# Patient Record
Sex: Female | Born: 1955
Health system: Southern US, Community
[De-identification: ages and names within clinical notes are randomized; demographics above are authoritative.]

## PROBLEM LIST (undated history)

## (undated) DIAGNOSIS — I10 Essential (primary) hypertension: Secondary | ICD-10-CM

## (undated) DIAGNOSIS — D649 Anemia, unspecified: Secondary | ICD-10-CM

## (undated) DIAGNOSIS — E785 Hyperlipidemia, unspecified: Secondary | ICD-10-CM

## (undated) DIAGNOSIS — Z85850 Personal history of malignant neoplasm of thyroid: Secondary | ICD-10-CM

## (undated) DIAGNOSIS — S04012A Injury of optic nerve, left eye, initial encounter: Secondary | ICD-10-CM

## (undated) DIAGNOSIS — Z974 Presence of external hearing-aid: Secondary | ICD-10-CM

## (undated) DIAGNOSIS — S04032A Injury of optic tract and pathways, left eye, initial encounter: Secondary | ICD-10-CM

## (undated) DIAGNOSIS — F32A Depression, unspecified: Secondary | ICD-10-CM

## (undated) DIAGNOSIS — H9191 Unspecified hearing loss, right ear: Secondary | ICD-10-CM

## (undated) DIAGNOSIS — K5130 Ulcerative (chronic) rectosigmoiditis without complications: Secondary | ICD-10-CM

## (undated) DIAGNOSIS — C801 Malignant (primary) neoplasm, unspecified: Secondary | ICD-10-CM

## (undated) DIAGNOSIS — Z860100 Personal history of colon polyps, unspecified: Secondary | ICD-10-CM

## (undated) DIAGNOSIS — I428 Other cardiomyopathies: Secondary | ICD-10-CM

## (undated) DIAGNOSIS — I509 Heart failure, unspecified: Secondary | ICD-10-CM

## (undated) DIAGNOSIS — M75102 Unspecified rotator cuff tear or rupture of left shoulder, not specified as traumatic: Secondary | ICD-10-CM

## (undated) DIAGNOSIS — R112 Nausea with vomiting, unspecified: Secondary | ICD-10-CM

## (undated) DIAGNOSIS — Z973 Presence of spectacles and contact lenses: Secondary | ICD-10-CM

## (undated) DIAGNOSIS — M797 Fibromyalgia: Secondary | ICD-10-CM

## (undated) DIAGNOSIS — Z9889 Other specified postprocedural states: Secondary | ICD-10-CM

## (undated) DIAGNOSIS — E890 Postprocedural hypothyroidism: Secondary | ICD-10-CM

## (undated) DIAGNOSIS — G43909 Migraine, unspecified, not intractable, without status migrainosus: Secondary | ICD-10-CM

## (undated) DIAGNOSIS — R0600 Dyspnea, unspecified: Secondary | ICD-10-CM

## (undated) DIAGNOSIS — F329 Major depressive disorder, single episode, unspecified: Secondary | ICD-10-CM

## (undated) DIAGNOSIS — F419 Anxiety disorder, unspecified: Secondary | ICD-10-CM

## (undated) DIAGNOSIS — L719 Rosacea, unspecified: Secondary | ICD-10-CM

## (undated) DIAGNOSIS — R011 Cardiac murmur, unspecified: Secondary | ICD-10-CM

## (undated) DIAGNOSIS — M1990 Unspecified osteoarthritis, unspecified site: Secondary | ICD-10-CM

## (undated) DIAGNOSIS — H9190 Unspecified hearing loss, unspecified ear: Secondary | ICD-10-CM

## (undated) DIAGNOSIS — R160 Hepatomegaly, not elsewhere classified: Secondary | ICD-10-CM

## (undated) DIAGNOSIS — K589 Irritable bowel syndrome without diarrhea: Secondary | ICD-10-CM

## (undated) DIAGNOSIS — K219 Gastro-esophageal reflux disease without esophagitis: Secondary | ICD-10-CM

## (undated) DIAGNOSIS — E119 Type 2 diabetes mellitus without complications: Secondary | ICD-10-CM

## (undated) DIAGNOSIS — R4586 Emotional lability: Secondary | ICD-10-CM

## (undated) HISTORY — DX: Ulcerative (chronic) rectosigmoiditis without complications: K51.30

## (undated) HISTORY — DX: Major depressive disorder, single episode, unspecified: F32.9

## (undated) HISTORY — DX: Fibromyalgia: M79.7

## (undated) HISTORY — DX: Hyperlipidemia, unspecified: E78.5

## (undated) HISTORY — DX: Malignant (primary) neoplasm, unspecified: C80.1

## (undated) HISTORY — PX: JOINT REPLACEMENT: SHX530

## (undated) HISTORY — DX: Unspecified osteoarthritis, unspecified site: M19.90

## (undated) HISTORY — DX: Rosacea, unspecified: L71.9

## (undated) HISTORY — DX: Essential (primary) hypertension: I10

## (undated) HISTORY — DX: Migraine, unspecified, not intractable, without status migrainosus: G43.909

## (undated) HISTORY — PX: TRANSTHORACIC ECHOCARDIOGRAM: SHX275

## (undated) HISTORY — PX: KNEE ARTHROSCOPY: SUR90

## (undated) HISTORY — DX: Unspecified hearing loss, right ear: H91.91

## (undated) HISTORY — DX: Depression, unspecified: F32.A

## (undated) HISTORY — PX: TOTAL THYROIDECTOMY: SHX2547

## (undated) HISTORY — DX: Irritable bowel syndrome, unspecified: K58.9

## (undated) HISTORY — DX: Cardiac murmur, unspecified: R01.1

## (undated) HISTORY — DX: Anxiety disorder, unspecified: F41.9

## (undated) HISTORY — DX: Hepatomegaly, not elsewhere classified: R16.0

## (undated) HISTORY — PX: CARDIOVASCULAR STRESS TEST: SHX262

## (undated) HISTORY — PX: OTHER SURGICAL HISTORY: SHX169

## (undated) HISTORY — PX: SHOULDER SURGERY: SHX246

## (undated) HISTORY — DX: Gastro-esophageal reflux disease without esophagitis: K21.9

---

## 1982-07-17 HISTORY — PX: DILATION AND CURETTAGE OF UTERUS: SHX78

## 1994-07-17 HISTORY — PX: EXCISION MORTON'S NEUROMA: SHX5013

## 1996-12-15 DIAGNOSIS — M797 Fibromyalgia: Secondary | ICD-10-CM | POA: Insufficient documentation

## 1998-01-07 ENCOUNTER — Encounter: Admission: RE | Admit: 1998-01-07 | Discharge: 1998-04-07 | Payer: Self-pay | Admitting: *Deleted

## 1998-04-29 ENCOUNTER — Other Ambulatory Visit: Admission: RE | Admit: 1998-04-29 | Discharge: 1998-04-29 | Payer: Self-pay | Admitting: *Deleted

## 1998-08-18 ENCOUNTER — Encounter: Payer: Self-pay | Admitting: Specialist

## 1998-08-19 ENCOUNTER — Observation Stay (HOSPITAL_COMMUNITY): Admission: RE | Admit: 1998-08-19 | Discharge: 1998-08-20 | Payer: Self-pay | Admitting: Specialist

## 1998-08-19 ENCOUNTER — Encounter: Payer: Self-pay | Admitting: Specialist

## 1999-07-18 HISTORY — PX: WRIST SURGERY: SHX841

## 2000-02-22 ENCOUNTER — Encounter: Admission: RE | Admit: 2000-02-22 | Discharge: 2000-05-22 | Payer: Self-pay | Admitting: Family Medicine

## 2001-03-04 ENCOUNTER — Encounter: Admission: RE | Admit: 2001-03-04 | Discharge: 2001-03-04 | Payer: Self-pay | Admitting: Neurology

## 2001-03-04 ENCOUNTER — Encounter: Payer: Self-pay | Admitting: Neurology

## 2001-07-17 HISTORY — PX: CARPOMETACARPAL (CMC) FUSION OF THUMB: SHX6290

## 2002-04-29 ENCOUNTER — Encounter: Admission: RE | Admit: 2002-04-29 | Discharge: 2002-06-11 | Payer: Self-pay | Admitting: *Deleted

## 2002-11-20 ENCOUNTER — Ambulatory Visit (HOSPITAL_COMMUNITY): Admission: RE | Admit: 2002-11-20 | Discharge: 2002-11-20 | Payer: Self-pay | Admitting: *Deleted

## 2002-11-20 ENCOUNTER — Encounter: Payer: Self-pay | Admitting: *Deleted

## 2002-12-02 ENCOUNTER — Ambulatory Visit (HOSPITAL_COMMUNITY): Admission: RE | Admit: 2002-12-02 | Discharge: 2002-12-02 | Payer: Self-pay | Admitting: *Deleted

## 2002-12-02 ENCOUNTER — Encounter: Payer: Self-pay | Admitting: *Deleted

## 2003-02-12 ENCOUNTER — Encounter: Admission: RE | Admit: 2003-02-12 | Discharge: 2003-02-12 | Payer: Self-pay | Admitting: Gastroenterology

## 2003-02-12 ENCOUNTER — Encounter: Payer: Self-pay | Admitting: Gastroenterology

## 2004-02-26 ENCOUNTER — Ambulatory Visit (HOSPITAL_COMMUNITY): Admission: RE | Admit: 2004-02-26 | Discharge: 2004-02-26 | Payer: Self-pay | Admitting: *Deleted

## 2004-02-26 ENCOUNTER — Encounter (INDEPENDENT_AMBULATORY_CARE_PROVIDER_SITE_OTHER): Payer: Self-pay | Admitting: Specialist

## 2004-04-28 ENCOUNTER — Ambulatory Visit: Payer: Self-pay

## 2004-05-05 ENCOUNTER — Ambulatory Visit: Payer: Self-pay | Admitting: General Practice

## 2004-05-17 ENCOUNTER — Emergency Department: Payer: Self-pay | Admitting: Emergency Medicine

## 2004-05-19 ENCOUNTER — Encounter (INDEPENDENT_AMBULATORY_CARE_PROVIDER_SITE_OTHER): Payer: Self-pay | Admitting: Specialist

## 2004-05-19 ENCOUNTER — Ambulatory Visit (HOSPITAL_COMMUNITY): Admission: RE | Admit: 2004-05-19 | Discharge: 2004-05-19 | Payer: Self-pay | Admitting: Gastroenterology

## 2004-10-31 ENCOUNTER — Ambulatory Visit: Payer: Self-pay | Admitting: Family Medicine

## 2004-11-25 ENCOUNTER — Encounter: Admission: RE | Admit: 2004-11-25 | Discharge: 2004-11-25 | Payer: Self-pay | Admitting: General Surgery

## 2005-08-30 ENCOUNTER — Encounter: Admission: RE | Admit: 2005-08-30 | Discharge: 2005-08-30 | Payer: Self-pay | Admitting: Family Medicine

## 2005-10-06 ENCOUNTER — Encounter: Admission: RE | Admit: 2005-10-06 | Discharge: 2005-10-06 | Payer: Self-pay | Admitting: Gastroenterology

## 2006-03-15 ENCOUNTER — Ambulatory Visit (HOSPITAL_COMMUNITY): Admission: RE | Admit: 2006-03-15 | Discharge: 2006-03-15 | Payer: Self-pay | Admitting: Anesthesiology

## 2006-05-31 ENCOUNTER — Ambulatory Visit: Payer: Self-pay

## 2006-06-04 ENCOUNTER — Ambulatory Visit: Payer: Self-pay

## 2006-06-25 ENCOUNTER — Inpatient Hospital Stay: Payer: Self-pay | Admitting: Unknown Physician Specialty

## 2006-06-25 DIAGNOSIS — C73 Malignant neoplasm of thyroid gland: Secondary | ICD-10-CM | POA: Insufficient documentation

## 2006-06-25 HISTORY — DX: Malignant neoplasm of thyroid gland: C73

## 2006-06-30 ENCOUNTER — Ambulatory Visit: Payer: Self-pay | Admitting: Oncology

## 2006-07-05 ENCOUNTER — Ambulatory Visit (HOSPITAL_COMMUNITY): Admission: RE | Admit: 2006-07-05 | Discharge: 2006-07-05 | Payer: Self-pay | Admitting: Oncology

## 2006-07-05 LAB — CBC WITH DIFFERENTIAL/PLATELET
BASO%: 0.4 % (ref 0.0–2.0)
Basophils Absolute: 0 10*3/uL (ref 0.0–0.1)
EOS%: 1.1 % (ref 0.0–7.0)
Eosinophils Absolute: 0.1 10*3/uL (ref 0.0–0.5)
HCT: 35.4 % (ref 34.8–46.6)
HGB: 12.1 g/dL (ref 11.6–15.9)
LYMPH%: 31.5 % (ref 14.0–48.0)
MCH: 30.2 pg (ref 26.0–34.0)
MCHC: 34 g/dL (ref 32.0–36.0)
MCV: 88.8 fL (ref 81.0–101.0)
MONO#: 0.3 10*3/uL (ref 0.1–0.9)
MONO%: 5.4 % (ref 0.0–13.0)
NEUT#: 3.8 10*3/uL (ref 1.5–6.5)
NEUT%: 61.6 % (ref 39.6–76.8)
Platelets: 520 10*3/uL — ABNORMAL HIGH (ref 145–400)
RBC: 3.98 10*6/uL (ref 3.70–5.32)
RDW: 13.5 % (ref 11.3–14.5)
WBC: 6.2 10*3/uL (ref 3.9–10.0)
lymph#: 2 10*3/uL (ref 0.9–3.3)

## 2006-07-08 LAB — THYROGLOBULIN PANEL
Antithyroglobulin Ab: 1.8 IU/mL (ref 0.0–14.4)
Thyroglobulin: 121 ng/mL — ABNORMAL HIGH (ref 1.8–68.0)

## 2006-07-08 LAB — COMPREHENSIVE METABOLIC PANEL
ALT: 39 U/L — ABNORMAL HIGH (ref 0–35)
AST: 29 U/L (ref 0–37)
Albumin: 4.4 g/dL (ref 3.5–5.2)
Alkaline Phosphatase: 79 U/L (ref 39–117)
BUN: 13 mg/dL (ref 6–23)
CO2: 27 mEq/L (ref 19–32)
Calcium: 9.6 mg/dL (ref 8.4–10.5)
Chloride: 101 mEq/L (ref 96–112)
Creatinine, Ser: 0.93 mg/dL (ref 0.40–1.20)
Glucose, Bld: 127 mg/dL — ABNORMAL HIGH (ref 70–99)
Potassium: 3.9 mEq/L (ref 3.5–5.3)
Sodium: 138 mEq/L (ref 135–145)
Total Bilirubin: 0.3 mg/dL (ref 0.3–1.2)
Total Protein: 7.1 g/dL (ref 6.0–8.3)

## 2006-07-08 LAB — LACTATE DEHYDROGENASE: LDH: 175 U/L (ref 94–250)

## 2006-07-08 LAB — T4, FREE: Free T4: 0.58 ng/dL — ABNORMAL LOW (ref 0.89–1.80)

## 2006-07-08 LAB — TSH: TSH: 21.945 u[IU]/mL — ABNORMAL HIGH (ref 0.350–5.500)

## 2006-07-08 LAB — CEA: CEA: 1.4 ng/mL (ref 0.0–5.0)

## 2006-07-27 ENCOUNTER — Encounter: Admission: RE | Admit: 2006-07-27 | Discharge: 2006-07-27 | Payer: Self-pay | Admitting: Endocrinology

## 2006-08-06 ENCOUNTER — Encounter: Admission: RE | Admit: 2006-08-06 | Discharge: 2006-08-06 | Payer: Self-pay | Admitting: Endocrinology

## 2006-08-14 ENCOUNTER — Encounter: Admission: RE | Admit: 2006-08-14 | Discharge: 2006-08-14 | Payer: Self-pay | Admitting: Endocrinology

## 2006-09-05 ENCOUNTER — Encounter: Admission: RE | Admit: 2006-09-05 | Discharge: 2006-09-05 | Payer: Self-pay | Admitting: Family Medicine

## 2007-04-01 ENCOUNTER — Encounter (HOSPITAL_COMMUNITY): Admission: RE | Admit: 2007-04-01 | Discharge: 2007-04-05 | Payer: Self-pay | Admitting: Endocrinology

## 2007-04-17 ENCOUNTER — Ambulatory Visit (HOSPITAL_COMMUNITY): Admission: RE | Admit: 2007-04-17 | Discharge: 2007-04-17 | Payer: Self-pay | Admitting: Endocrinology

## 2007-04-22 ENCOUNTER — Encounter: Admission: RE | Admit: 2007-04-22 | Discharge: 2007-04-22 | Payer: Self-pay | Admitting: Gastroenterology

## 2007-10-07 ENCOUNTER — Encounter: Admission: RE | Admit: 2007-10-07 | Discharge: 2007-10-07 | Payer: Self-pay | Admitting: Endocrinology

## 2007-10-14 ENCOUNTER — Encounter: Admission: RE | Admit: 2007-10-14 | Discharge: 2007-10-14 | Payer: Self-pay | Admitting: Endocrinology

## 2007-12-02 ENCOUNTER — Encounter: Admission: RE | Admit: 2007-12-02 | Discharge: 2007-12-02 | Payer: Self-pay | Admitting: Family Medicine

## 2008-10-12 ENCOUNTER — Encounter: Admission: RE | Admit: 2008-10-12 | Discharge: 2008-10-12 | Payer: Self-pay | Admitting: Internal Medicine

## 2008-10-29 ENCOUNTER — Encounter: Admission: RE | Admit: 2008-10-29 | Discharge: 2008-10-29 | Payer: Self-pay | Admitting: Gastroenterology

## 2008-12-02 ENCOUNTER — Encounter: Admission: RE | Admit: 2008-12-02 | Discharge: 2008-12-02 | Payer: Self-pay | Admitting: Gastroenterology

## 2008-12-07 ENCOUNTER — Encounter: Admission: RE | Admit: 2008-12-07 | Discharge: 2008-12-07 | Payer: Self-pay | Admitting: Gastroenterology

## 2009-06-14 HISTORY — PX: OTHER SURGICAL HISTORY: SHX169

## 2009-07-17 HISTORY — PX: OTHER SURGICAL HISTORY: SHX169

## 2009-07-22 ENCOUNTER — Encounter: Admission: RE | Admit: 2009-07-22 | Discharge: 2009-07-22 | Payer: Self-pay | Admitting: Gastroenterology

## 2009-12-27 ENCOUNTER — Encounter: Admission: RE | Admit: 2009-12-27 | Discharge: 2009-12-27 | Payer: Self-pay | Admitting: Gastroenterology

## 2010-05-17 HISTORY — PX: OTHER SURGICAL HISTORY: SHX169

## 2010-08-07 ENCOUNTER — Encounter: Payer: Self-pay | Admitting: Internal Medicine

## 2010-08-08 ENCOUNTER — Encounter: Payer: Self-pay | Admitting: Endocrinology

## 2010-08-12 LAB — POCT I-STAT, CHEM 8
BUN: 9 mg/dL (ref 6–23)
Calcium, Ion: 1.25 mmol/L (ref 1.12–1.32)
Chloride: 105 mEq/L (ref 96–112)
Creatinine, Ser: 1 mg/dL (ref 0.4–1.2)
Glucose, Bld: 111 mg/dL — ABNORMAL HIGH (ref 70–99)
HCT: 37 % (ref 36.0–46.0)
Hemoglobin: 12.6 g/dL (ref 12.0–15.0)
Potassium: 4.4 mEq/L (ref 3.5–5.1)
Sodium: 143 mEq/L (ref 135–145)
TCO2: 30 mmol/L (ref 0–100)

## 2010-08-16 ENCOUNTER — Ambulatory Visit
Admission: RE | Admit: 2010-08-16 | Discharge: 2010-08-16 | Payer: Self-pay | Source: Home / Self Care | Attending: Orthopedic Surgery | Admitting: Orthopedic Surgery

## 2010-08-16 LAB — GLUCOSE, CAPILLARY
Glucose-Capillary: 117 mg/dL — ABNORMAL HIGH (ref 70–99)
Glucose-Capillary: 126 mg/dL — ABNORMAL HIGH (ref 70–99)

## 2010-08-16 LAB — POCT HEMOGLOBIN-HEMACUE: Hemoglobin: 12.7 g/dL (ref 12.0–15.0)

## 2010-08-27 NOTE — Op Note (Signed)
Meagan Allen, ENGELBRECHT                ACCOUNT NO.:  [id number]  MEDICAL RECORD NO.:  [id number]          PATIENT TYPE:  AMB  LOCATION:  [place]                          FACILITY:  [facility]  PHYSICIAN:  Weber Cooks, M.D.     DATE OF BIRTH:  1956/03/25  DATE OF PROCEDURE:  08/16/2010 DATE OF DISCHARGE:                              OPERATIVE REPORT   PREOPERATIVE DIAGNOSIS:  Left knee medial meniscal posterior horn tear.  POSTOPERATIVE DIAGNOSIS:  Left knee medial meniscal posterior horn tear.  OPERATION:  Left knee arthroscopy with debridement of medial meniscus.  ANESTHESIA:  General.  SURGEON:  Weber Cooks, M.D.  ASSISTANT:  None.  ESTIMATED BLOOD LOSS:  Minimal.  TOURNIQUET TIME:  Approximately 50 minutes.  COMPLICATIONS:  None.  DISPOSITION:  Stable to PR.  INDICATIONS:  This is a 55 year old female who has had persistent posterior medial knee pain that was interfering with her life, where she cannot do what she wants to do despite conservative management.  She was consented for the above procedure.  All risks of infection, vessel injury, persistent pain, worse pain, prolonged recovery, stiffness, arthritis, sinus formation, synovial cyst formation, DVT, PE were all explained.  Questions were encouraged and answered.  OPERATION:  The patient was brought to the operating room, placed in supine position after adequate general anesthesia was administered as well as Ancef 1 g IV piggyback.  Left lower extremity was then prepped and draped in a sterile manner over a proximally placed thigh tourniquet and a thigh bolster.  All bony prominences were well padded.  We started the procedure by mapping out the anatomical landmarks to include the patella tendon and superior pole and the medial border of the patella as well.  We then, through a nick and spread technique, established inflow through a superior-medial portal.  Once this was done, we then created an anteromedial portal  just medial to the patellar tendon.  Blunt tip trocar with cannula followed by camera were then placed into the knee, and then we medially went superior via patellar pouch.  Inflow was slightly more distal than desired, but was functioning.  We would later use this portal to evaluate the medial gutter.  We then evaluated the patellofemoral joint.  There was some arthritis, more in the lateral aspect than medial aspect as expected.  We also went into the medial gutter as well, and again, there was no significant pathology, no plicas or loose bodies, and the meniscus was normal throughout in this area. We then went to the femoral notch.  We then placed a spinal needle just lateral to patellar tendon.  Once this was in a desirable position, we then created the anterolateral portal lateral to the patellar tendon. This was done through a nick and spread technique.  We then placed the camera anterolaterally and instrumented anteromedially.  We then placed a valgus flexion moment to the knee to evaluate the posterior horn medial meniscus.  Once this was done, it was found that there was a radial tear of the posterior horn, this was probed and this flapped into the joint.  There were actually two  independent tears that flapped as well.  Once this was probed, we then carefully debrided this with a basket followed by a shaver.  Because of bleeding posteriorly, we elevated the tourniquet.  The bleeding was in ooze every time we would place the knee in a valgus position.  This was done until the meniscus was completely debrided, and we probed it once again and found that there was no loose portion to the meniscus.  We then ranged the knee, and again, there was no pinching in this area.  The meniscus was rounded off anteriorly towards the mid body as well.  We then __________ ACL was intact.  PCL was intact.  We then went to the lateral meniscus and this was pristine.  There was very little arthritis  of the lateral compartment whereas the medial compartment had arthritic changes.  There was no obvious osteochondral lesion.  We then evaluated the lateral gutter of the ankle, and again, there was no plaque or pathology in this area.  Pictures were obtained throughout the procedure.  Camera was removed.  The wound was closed with 4-0 nylon stitch.  Sterile dressing was applied.  Ice was applied.  Knee immobilizer was applied.  The patient was stable to PR.     Weber Cooks, M.D.     PB/MEDQ  D:  08/16/2010  T:  08/17/2010  Job:  [id number]  Electronically Signed by Weber Cooks M.D. on 08/27/2010 09:09:11 AM

## 2010-09-27 ENCOUNTER — Other Ambulatory Visit: Payer: Self-pay | Admitting: Gastroenterology

## 2010-09-27 DIAGNOSIS — C73 Malignant neoplasm of thyroid gland: Secondary | ICD-10-CM

## 2010-09-29 ENCOUNTER — Ambulatory Visit
Admission: RE | Admit: 2010-09-29 | Discharge: 2010-09-29 | Disposition: A | Discharge status: Home / Self Care | Payer: BC Managed Care – PPO | Source: Ambulatory Visit | Attending: Gastroenterology | Admitting: Gastroenterology

## 2010-09-29 DIAGNOSIS — C73 Malignant neoplasm of thyroid gland: Secondary | ICD-10-CM

## 2010-12-02 NOTE — Op Note (Signed)
NAME:  Meagan Allen, Meagan Allen                          ACCOUNT NO.:  [id number]   MEDICAL RECORD NO.:  [id number]                   PATIENT TYPE:  AMB   LOCATION:  [facility]                                  FACILITY:  [facility]   PHYSICIAN:  Freddie Apley, M.D.            DATE OF BIRTH:  1956/05/05   DATE OF PROCEDURE:  02/26/2004  DATE OF DISCHARGE:                                 OPERATIVE REPORT   PREOPERATIVE DIAGNOSES:  Menorrhagia and also no evidence of endometrial  polyp.   POSTOPERATIVE DIAGNOSES:  Small flat polyp along the left uterine wall large  cavity.   PROCEDURE:  Exam under anesthesia, fractional D&C, hysteroscopy with  resection of endometrial polyp, roller ball ablation.   INDICATIONS FOR PROCEDURE:  This is a 55 year old female who has been having  some heavy menstrual periods.  She recently was sent for a sonogram due to  this heavy bleeding and found to have a thickened endometrium. At that time,  she had several myomas, the largest being about 4 cm in size. There were  small myomas as well. For this reason, a hydrosonogram was performed and  this hydrosonogram showed a suggestion of a solitary endometrial polyp along  the posterior uterine wall.  Because of her menorrhagia, she was counseled  for endometrial ablation and we plan to do a Novasure ablation today.   FINDINGS:  The patient's uterus was approximately 10 weeks in size. The  cavity sounded to 11 cm.  There were no discreet submucosal myomas. There  was a suggestion of a flat polyp lying along the left uterine wall just  distal to the ostia. This was not a discreet polyp nor was it on a polypoid  stalk.  There were no other filling defects. The cavity was able to be  easily distended despite its size.   DESCRIPTION OF PROCEDURE:  Meagan Allen was brought to the operating room  with an IV in place. She had received a gram of Ancef in the holding area.  Supine on the OR table, IV sedation was administered and then  an LMA was  placed for the delivery of general anesthesia.  The patient was placed into  Allen stirrups. The exam under anesthesia was performed. She was then  prepped with a solution of Hibiclens and then draped for a sterile vaginal  procedure.   A weighted vaginal speculum was introduced into the vagina. The cervix was  visualized and 0.25% Marcaine was injected into the paracervical tissues at  the 3, 4, 7 and 8 position. A single tooth tenaculum was used to grasp the  anterior cervix. A Kevorkian curette was used to obtain endocervical  curettings.  The uterine sound then passed easily in a retroverted position  to a depth of 11 cm.  The cavity was then dilated with serial Pratt dilators  to admit the resectoscope.  With through and through sorbitol irrigation,  the  cavity was visualized and photographs were taken.  The right angled wire  was used to resect the endometrium along the left wall and this was sent  with the endometrial curettings later on.  A large sharp curette was used to  curette all the endometrial walls with the tissue collected onto a Telfa.  Next an attempt was made to place the Novasure for endometrial ablation.  This was not possible due to leaking gas around the gasket. For this reason,  the plan to use this was abandoned and the roller ball was placed on the  resectoscope. Again using through and through irrigation, the walls were  constantly bathed with fluid as the roller ball was used to ablate all the  visible endometrial tissue.  Where there were active bleeders, these were  also  ablated. At the end of the procedure, the entire endometrial lining had been  ablated. The areas very close to the tubal ostia were not visible but they  were not ablated. The roller ball was removed from the endometrial cavity  and it was sounded again to a depth of 11 cm. The patient was taken to the  recovery room in good condition.                                                Freddie Apley, M.D.    MAJ/MEDQ  D:  02/26/2004  T:  02/26/2004  Job:  [id number]

## 2010-12-02 NOTE — Op Note (Signed)
[name], Meagan Allen                ACCOUNT NO.:  [id number]   MEDICAL RECORD NO.:  [id number]          PATIENT TYPE:  AMB   LOCATION:  [facility]                         FACILITY:  [facility]   PHYSICIAN:  Earle Gell, M.D.   DATE OF BIRTH:  [birth year]   DATE OF PROCEDURE:  05/19/2004  DATE OF DISCHARGE:                                 OPERATIVE REPORT   PROCEDURE:  Colonoscopy.   PROCEDURE INDICATION:  Ms. Meagan Allen is a 55 year old female, born  Jan 10, 1956.  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  In 1992, her upper GI small-bowel follow-through x-ray series and abdominal  ultrasound were normal.  In 1996, her colonoscopy revealed proctitis.  In  1997, her colonoscopy revealed proctosigmoiditis.  In 1998, her upper GI  small-bowel follow-through x-ray series was normal.   PAST MEDICAL HISTORY:  1.  Iron deficiency anemia diagnosed January 07, 2003.  2.  Hypertension.  3.  Type 2 diabetes mellitus.  4.  Gastroesophageal reflux.  5.  Rosacea.  6.  Allergic asthma.   ENDOSCOPIST:  Earle Gell, M.D.   PREMEDICATION:  1.  Versed 7.5 mg.  2.  Demerol 50 mg.   DESCRIPTION OF PROCEDURE:  After obtaining informed consent, Ms. Meagan Allen was  placed in the left lateral decubitus position.  I administered intravenous  Demerol and intravenous Versed to achieve conscious sedation for the  procedure.  The patient's blood pressure, oxygen saturation, and cardiac  rhythm were monitored throughout the procedure and documented in the medical  record.   Anal inspection and digital rectal exam were normal.  The Olympus adjustable  pediatric colonoscope was introduced into the rectum and with some  difficulty due to an extremely poor colonic prep and colonic loop formation,  I think I advanced the colonoscope to the mid ascending colon before running  out of colonoscope.  As a result, the cecum was not evaluated.  The  patient's colonic prep was so poor I had difficulty seeing  most of the  colonic mucosa despite thorough irrigation with three canisters of water.  What I could view of the colonic mucosa appeared normal.  Ms. Meagan Allen does  have mild proctitis.  I did not detect any polyps, although small polyps  could have easily been missed due to the extremely poorly prepped colon.   Biopsies:  Approximately 32 biopsies were taken along the length of the  colon and rectum and submitted to pathology to rule out mucosal dysplasia.   ASSESSMENT:  1.  Chronic ulcerative proctosigmoiditis.  2.  Extremely poorly prepped colon for an accurate colonoscopy.  3.  Incomplete colonoscopy in that I did not reach the cecum and only      reached what I think is the mid ascending colon.  4.  Exam today reveals only proctitis and no colitis.  5.  Random colonic biopsies are pending.      MJ/MEDQ  D:  05/19/2004  T:  05/19/2004  Job:  [id number]

## 2011-01-20 ENCOUNTER — Other Ambulatory Visit: Payer: Self-pay | Admitting: Gastroenterology

## 2011-01-20 DIAGNOSIS — Z1231 Encounter for screening mammogram for malignant neoplasm of breast: Secondary | ICD-10-CM

## 2011-01-26 ENCOUNTER — Ambulatory Visit: Payer: BC Managed Care – PPO

## 2011-01-27 ENCOUNTER — Ambulatory Visit
Admission: RE | Admit: 2011-01-27 | Discharge: 2011-01-27 | Disposition: A | Discharge status: Home / Self Care | Payer: BC Managed Care – PPO | Source: Ambulatory Visit | Attending: Gastroenterology | Admitting: Gastroenterology

## 2011-01-27 DIAGNOSIS — Z1231 Encounter for screening mammogram for malignant neoplasm of breast: Secondary | ICD-10-CM

## 2011-05-01 ENCOUNTER — Other Ambulatory Visit: Payer: Self-pay | Admitting: Dermatology

## 2011-06-21 ENCOUNTER — Other Ambulatory Visit: Payer: Self-pay | Admitting: Obstetrics and Gynecology

## 2011-06-21 DIAGNOSIS — N63 Unspecified lump in unspecified breast: Secondary | ICD-10-CM

## 2011-06-29 ENCOUNTER — Ambulatory Visit
Admission: RE | Admit: 2011-06-29 | Discharge: 2011-06-29 | Disposition: A | Discharge status: Home / Self Care | Payer: BC Managed Care – PPO | Source: Ambulatory Visit | Attending: Obstetrics and Gynecology | Admitting: Obstetrics and Gynecology

## 2011-06-29 DIAGNOSIS — N63 Unspecified lump in unspecified breast: Secondary | ICD-10-CM

## 2011-10-06 ENCOUNTER — Other Ambulatory Visit: Payer: Self-pay | Admitting: Internal Medicine

## 2011-10-06 DIAGNOSIS — C73 Malignant neoplasm of thyroid gland: Secondary | ICD-10-CM

## 2011-10-16 ENCOUNTER — Ambulatory Visit
Admission: RE | Admit: 2011-10-16 | Discharge: 2011-10-16 | Disposition: A | Discharge status: Home / Self Care | Payer: BC Managed Care – PPO | Source: Ambulatory Visit | Attending: Internal Medicine | Admitting: Internal Medicine

## 2011-10-16 DIAGNOSIS — C73 Malignant neoplasm of thyroid gland: Secondary | ICD-10-CM

## 2011-12-25 ENCOUNTER — Other Ambulatory Visit: Payer: Self-pay | Admitting: Gastroenterology

## 2011-12-25 DIAGNOSIS — Z1231 Encounter for screening mammogram for malignant neoplasm of breast: Secondary | ICD-10-CM

## 2012-01-29 ENCOUNTER — Ambulatory Visit: Payer: BC Managed Care – PPO

## 2012-02-07 ENCOUNTER — Other Ambulatory Visit: Payer: Self-pay | Admitting: Cardiology

## 2012-02-08 ENCOUNTER — Other Ambulatory Visit: Payer: Self-pay | Admitting: Cardiology

## 2012-02-08 ENCOUNTER — Encounter: Payer: Self-pay | Admitting: Cardiology

## 2012-02-08 NOTE — H&P (Signed)
Office Visit     Patient: Meagan Allen, Meagan Allen Provider: Fransico Him, MD  DOB: Dec 14, 1955   Age: 56 Y   Sex: Female Date: 02/06/2012  Phone: [phone]  Address: [address], [place], [place]  Pcp: Earle Gell, III     --------------------------------------------------------------------------------  Subjective:    CC:      1. TT/stress test f/u.      HPI:     General:           The patient presents today for evaluation of abnormal stress test. She says that on June 9th she was working in the yard and came back inside to get something to drink and developed severe chest pressure with bilateral arm numbness and SOB. She sat down and about 30 minutes later she felt better. She mentioned it to her primary MD at the time of her PE and a stress test was ordered. She underwent nuclear stress test which showed inferior ischemia. Since then she has had some intermittent chest tightness with exertion. .      ROS:      See HPI, A twelve system review was perfomed at today's visit. For pertinent positives and negatives see HPI.     Medical History: Type 2 Diabetes Mellitus, Hypertension, GERD, IBS, Fibromyalgia syndrome, Anxiety/Depression, Rosacea, Postmenopausal, Papillary thyroid carcinoma with metastasis to cervical lymph nodes, Ulcerative proctosigmoiditis since 1996.      Surgical History: Neck Resection 08/2007, D&C 1984, Thyroidectomy 2007, Giant cell tumor removed from the right ankle 2011, left knee x 2 .      Hospitalization/Major Diagnostic Procedure: Childbirth x2 .      Family History:  Father: deceased Hypertension, Heart Disease, Diabetes Mellitus Mother: alive Hypertension,Anemia, Rheumatoid Arthritis, Stroke Paternal [place] Father: deceased Paternal Grand Mother: deceased Dementia Maternal Grand Father: deceased Maternal Grand Mother: deceased Brother 1: alive Gout Brother2: alive Hyperlipidemia,borderline diabetes mellitus Brother 3: alive Gout, Hypothyroidism  Sister 1: deceased Age 48 months  NO history of cancer.     Social History:      General:          History of smoking              cigarettes:  Former smoker            Quit in year  1982         no Alcohol.          Caffeine: yes, 4 servings per day.          no Recreational drug use.          no Exercise.          Occupation: employed, [employer]er.          Marital Status: married.          Children: 2.     Mother-in-law died Oct 30, 2008.     Medications: Omeprazole 20 MG Capsule Delayed Release 1 capsule Once a day, Nitroglycerin 0.4 mg 0.4 mg tablet 1 tablet as directed as directed prn chest pain, GlycoLax 17gm Powder as directed AS DIRECTED, Polyethylene Glycol 3350 3350 Powder TAKE AS DIRECTED , Chlorthalidone 25 MG Tablet TAKE 1 TABLET BY MOUTH EVERY MORNING , Metoprolol Tartrate 100 MG Tablet 1 tablet Once a day, Synthroid 175 MCG Tablet 1 tablet every morning on an empty stomach Once a day, Lantus SoloStar 100Units/ML Insulin 120 units Once a day at bedtime, Janumet XR 100/1000 mg 100 mg / 1000 mg Tablet 1 tablet every evening with food, Aspirin  81 MG Tablet Delayed Release 1 tablet Once a day, BuPROPion HCl 150 MG Tablet Extended Release 24 Hour TAKE 3 TABLETS BY MOUTH ONCE A DAY , Clonazepam 0.5 MG Tablet 0.5 tablet PRN, Medication List reviewed and reconciled with the patient     Allergies: Sulfa drugs (for allergy): hives.      Objective:    Vitals: Wt 227.6, Ht 66.5, BMI 36.18, Pulse sitting 74, BP sitting 130/88.     Examination:     Cardiology, General:         GENERAL APPEARANCE: pleasant, NAD.          HEENT: unremarkable.          CAROTID UPSTROKE: normal, no bruit.          JVD: flat.          HEART SOUNDS: regular, normal S1, S2, no S3 or S4.          MURMUR: absent.          LUNGS: no rales or wheezes.          ABDOMEN: soft, non tender, positive bowel sounds, no masses felt.          EXTREMITIES: no leg edema.          PERIPHERAL PULSES: 2 plus bilateral.             Assessment:    Assessment:  1. Chest pain - 786.50 (Primary)   2. Hypertension - 401.9   3. Abnormal cardiovascular function study - 794.30     Plan:    1. Chest pain  Start Aspirin EC Tablet Delayed Release, 325 MG, 1 tablet as needed, Orally, every 4 hrs ;  Start Nitroglycerin 0.4 mg tablet, 0.4 mg, 1 tablet as directed, SL, as directed prn chest pain, 30 days, 25, Refills 5 .         LAB: Basic Metabolic  elevated Calcium     GLUCOSE 88 70-99 - mg/dL        BUN 12 6-26 - mg/dL        CREATININE 0.83 0.60-1.30 - mg/dl        eGFR (NON-AFRICAN AMERICAN) 71 >60 - calc        eGFR (AFRICAN AMERICAN) 86 >60 - calc        SODIUM 140 136-145 - mmol/L        POTASSIUM 3.7 3.5-5.5 - mmol/L        CHLORIDE 99 98-107 - mmol/L        C02 32 22-32 - mg/dL        ANION GAP 13.6 6.0-20.0 - mmol/L         CALCIUM 10.4 8.6-10.3 - mg/dL H              Varun Jourdan M 02/06/2012 08:14:02 PM > please forward to primary MD for elevated calcium Corson,Danielle 02/07/2012 08:40:35 AM > PT aware via VM per DPR, and forwarded to pts PCP. JOHNSON,MARTIN , III 02/07/2012 02:01:33 PM > perform fasting BMET and PTH level. Jones,Catrina 02/07/2012 04:36:24 PM > LMTC Jones,Catrina 02/08/2012 08:53:03 AM > Pt notified of results & next lab appt due on 02/09/2012 (AM-prior to procedure).        LAB: PT (Prothrombin Time) (400867)  Normal     Prothrombin Time 10.3 9.1-12.0 - SEC        INR 1.0 0.8-1.2 -               Harward,Amy 02/07/2012 02:31:19 PM >  for cath [facility] M 02/07/2012 04:42:41 PM > Corson,Danielle 02/07/2012 04:53:56 PM > pt is aware        LAB: CBC with Diff  Normal     WBC 7.1 4.0-11.0 - K/ul        RBC 4.53 4.20-5.40 - M/uL        HGB 13.4 12.0-16.0 - g/dL        HCT 40.1 37.0-47.0 - %        MCH 29.6 27.0-33.0 - pg        MPV 8.2 7.5-10.7 - fL        MCV 88.7 81.0-99.0 - fL        MCHC 33.4 32.0-36.0 - g/dL        RDW 14.7 11.5-15.5 - %        NRBC# 0.00 -        PLT 314 150-400 -  K/uL        NEUT % 53.5 43.3-71.9 - %        NRBC% 0.00 - %        LYMPH% 38.1 16.8-43.5 - %        MONO % 7.0 4.6-12.4 - %        EOS % 1.0 0.0-7.8 - %        BASO % 0.4 0.0-1.0 - %        NEUT # 3.8 1.9-7.2 - K/uL        LYMPH# 2.70 1.10-2.70 - K/uL        MONO # 0.5 0.3-0.8 - K/uL        EOS # 0.1 0.0-0.6 - K/uL        BASO # 0.0 0.0-0.1 - K/uL               Sherrill Mckamie M 02/06/2012 08:13:09 PM > Corson,Danielle 02/07/2012 08:41:16 AM > pt aware.   Her symptoms are concerning for angina and in light of the abnormal nuclear stress I have recommended that we proceed with cardiac cath to further evaluate coronary anatomy. , Risks and benefits of cardiac catheterization have been reviewed including risk of stroke, heart attack, death, bleeding, renal impariment and arterial damage. There was ample oppurtuny to answer questions. Alternatives were discussed. Patient understands and wishes to proceed. I have had a lengthy discussion about the stress test results and the risks and benfits of cath and the patient wishes to proceed.          Immunizations:       Labs:      Procedure Codes: 11941 ECL BMP, 74081 ECL CBC PLATELET DIFF, 44818 BLOOD COLLECTION ROUTINE VENIPUNCTURE     Preventive:           Follow Up: cath        Provider: Fransico Him, MD  Patient: Meagan Allen, Meagan Allen  DOB: April 26, 1956  Date: 02/06/2012

## 2012-02-09 ENCOUNTER — Encounter (HOSPITAL_BASED_OUTPATIENT_CLINIC_OR_DEPARTMENT_OTHER)
Admission: RE | Disposition: A | Discharge status: Home / Self Care | Payer: Self-pay | Source: Ambulatory Visit | Attending: Cardiology

## 2012-02-09 ENCOUNTER — Inpatient Hospital Stay (HOSPITAL_BASED_OUTPATIENT_CLINIC_OR_DEPARTMENT_OTHER)
Admission: RE | Admit: 2012-02-09 | Discharge: 2012-02-09 | Disposition: A | Discharge status: Home / Self Care | Payer: BC Managed Care – PPO | Source: Ambulatory Visit | Attending: Cardiology | Admitting: Cardiology

## 2012-02-09 ENCOUNTER — Encounter (HOSPITAL_BASED_OUTPATIENT_CLINIC_OR_DEPARTMENT_OTHER): Payer: Self-pay | Admitting: *Deleted

## 2012-02-09 DIAGNOSIS — R0789 Other chest pain: Secondary | ICD-10-CM | POA: Insufficient documentation

## 2012-02-09 DIAGNOSIS — I10 Essential (primary) hypertension: Secondary | ICD-10-CM | POA: Insufficient documentation

## 2012-02-09 DIAGNOSIS — IMO0001 Reserved for inherently not codable concepts without codable children: Secondary | ICD-10-CM | POA: Insufficient documentation

## 2012-02-09 DIAGNOSIS — E119 Type 2 diabetes mellitus without complications: Secondary | ICD-10-CM | POA: Insufficient documentation

## 2012-02-09 DIAGNOSIS — K219 Gastro-esophageal reflux disease without esophagitis: Secondary | ICD-10-CM | POA: Insufficient documentation

## 2012-02-09 SURGERY — JV LEFT HEART CATHETERIZATION WITH CORONARY ANGIOGRAM
Anesthesia: Moderate Sedation

## 2012-02-09 MED ORDER — DIAZEPAM 5 MG PO TABS
5.0000 mg | ORAL_TABLET | ORAL | Status: AC
  Administered 2012-02-09: 5 mg via ORAL

## 2012-02-09 MED ORDER — SODIUM CHLORIDE 0.9 % IJ SOLN: 3.0000 mL | INTRAMUSCULAR | Status: DC | PRN

## 2012-02-09 MED ORDER — ASPIRIN 81 MG PO CHEW
324.0000 mg | CHEWABLE_TABLET | ORAL | Status: AC
  Administered 2012-02-09: 324 mg via ORAL

## 2012-02-09 MED ORDER — ACETAMINOPHEN 325 MG PO TABS: 650.0000 mg | ORAL_TABLET | ORAL | Status: DC | PRN

## 2012-02-09 MED ORDER — SODIUM CHLORIDE 0.9 % IV SOLN: 1.0000 mL/kg/h | INTRAVENOUS | Status: DC

## 2012-02-09 MED ORDER — SODIUM CHLORIDE 0.9 % IV SOLN: INTRAVENOUS | Status: DC

## 2012-02-09 MED ORDER — ONDANSETRON HCL 4 MG/2ML IJ SOLN: 4.0000 mg | Freq: Four times a day (QID) | INTRAMUSCULAR | Status: DC | PRN

## 2012-02-09 MED ORDER — SODIUM CHLORIDE 0.9 % IJ SOLN: 3.0000 mL | Freq: Two times a day (BID) | INTRAMUSCULAR | Status: DC

## 2012-02-09 MED ORDER — SODIUM CHLORIDE 0.9 % IV SOLN: 250.0000 mL | INTRAVENOUS | Status: DC | PRN

## 2012-02-09 NOTE — CV Procedure (Signed)
PROCEDURE:  Left heart catheterization with selective coronary angiography, left ventriculogram.  INDICATIONS:    The risks, benefits, and details of the procedure were explained to the patient.  The patient verbalized understanding and wanted to proceed.  Informed written consent was obtained.  PROCEDURE TECHNIQUE:  After Xylocaine anesthesia a 80F sheath was placed in the right femoral artery with a single anterior needle wall stick.   Left coronary angiography was done using a Judkins L4 guide catheter.  Right coronary angiography was done using a Judkins R4 guide catheter.  Left ventriculography was done using a pigtail catheter.    CONTRAST:  Total of 60 cc.  COMPLICATIONS:  None.    HEMODYNAMICS:  Aortic pressure was 150/63mHg; LV pressure was 145/178mg; LVEDP 1562m.  There was no gradient between the left ventricle and aorta.    ANGIOGRAPHIC DATA:   The left main coronary artery is widely patent and bifurcates into an LAD left circumflex.  The left anterior descending artery is widely patent and gives rise to a first diagonal which is widely patent.  The left circumflex artery is widely patent and gives rise to a first small OM1.  It then gives rise to a large OM2 which is widely patent,  The ongoing left circumflex is patent.  The right coronary artery is widely and gives rise to an acute RV marginal branch which is patent.  The distal right coronary artery gives rise to a PL and PDA branches which are patent.  LEFT VENTRICULOGRAM:  Left ventricular angiogram was done in the 30 RAO projection and revealed normal left ventricular wall motion and systolic function with an estimated ejection fraction of 55%.  LVEDP was 15 mmHg.  IMPRESSIONS:  1. Normal left main coronary artery. 2. Normal left anterior descending artery and its branches. 3. Normal left circumflex artery and its branches. 4. Normal right coronary artery. 5. Normal left ventricular systolic function.  LVEDP 15  mmHg.  Ejection fraction 55%.  RECOMMENDATION:   Discharge home after bedrest and IVF hydration complete.  Followup with my NP in 2 weeks for groin check.  Followup with primary MD for further workup of noncardiac CP.

## 2012-02-09 NOTE — Progress Notes (Signed)
Bedrest begins @ 0825.  Tegaderm dressing applied to right groin site.  Groin level 0.

## 2012-02-09 NOTE — Interval H&P Note (Signed)
History and Physical Interval Note:  02/09/2012 7:44 AM  Meagan Allen  has presented today for surgery, with the diagnosis of abn st  The various methods of treatment have been discussed with the patient and family. After consideration of risks, benefits and other options for treatment, the patient has consented to  Procedure(s) (LRB): JV LEFT HEART CATHETERIZATION WITH CORONARY ANGIOGRAM (N/A) as a surgical intervention .  The patient's history has been reviewed, patient examined, no change in status, stable for surgery.  I have reviewed the patient's chart and labs.  Questions were answered to the patient's satisfaction.     Henrry Feil R

## 2012-02-09 NOTE — H&P (View-Only) (Signed)
Office Visit     Patient: Meagan Allen, Meagan Allen Provider: Fransico Him, MD  DOB: Nov 20, 1955   Age: 56 Y   Sex: Female Date: 02/06/2012  Phone: [phone]  Address: [address], [place], [id number]  Pcp: Earle Gell, III     --------------------------------------------------------------------------------  Subjective:    CC:      1. TT/stress test f/u.      HPI:     General:           The patient presents today for evaluation of abnormal stress test. She says that on June 9th she was working in the yard and came back inside to get something to drink and developed severe chest pressure with bilateral arm numbness and SOB. She sat down and about 30 minutes later she felt better. She mentioned it to her primary MD at the time of her PE and a stress test was ordered. She underwent nuclear stress test which showed inferior ischemia. Since then she has had some intermittent chest tightness with exertion. .      ROS:      See HPI, A twelve system review was perfomed at today's visit. For pertinent positives and negatives see HPI.     Medical History: Type 2 Diabetes Mellitus, Hypertension, GERD, IBS, Fibromyalgia syndrome, Anxiety/Depression, Rosacea, Postmenopausal, Papillary thyroid carcinoma with metastasis to cervical lymph nodes, Ulcerative proctosigmoiditis since 1996.      Surgical History: Neck Resection 08/2007, D&C 1984, Thyroidectomy 2007, Giant cell tumor removed from the right ankle 2011, left knee x 2 .      Hospitalization/Major Diagnostic Procedure: Childbirth x2 .      Family History:  Father: deceased Hypertension, Heart Disease, Diabetes Mellitus Mother: alive Hypertension,Anemia, Rheumatoid Arthritis, Stroke Paternal [place] Father: deceased Paternal Grand Mother: deceased Dementia Maternal Grand Father: deceased Maternal Grand Mother: deceased Brother 1: alive Gout Brother2: alive Hyperlipidemia,borderline diabetes mellitus Brother 3: alive Gout, Hypothyroidism  Sister 1: deceased Age 27 months  NO history of cancer.     Social History:      General:          History of smoking              cigarettes:  Former smoker            Quit in year  1982         no Alcohol.          Caffeine: yes, 4 servings per day.          no Recreational drug use.          no Exercise.          Occupation: employed, Banker.          Marital Status: married.          Children: 2.     Mother-in-law died 10/06/08.     Medications: Omeprazole 20 MG Capsule Delayed Release 1 capsule Once a day, Nitroglycerin 0.4 mg 0.4 mg tablet 1 tablet as directed as directed prn chest pain, GlycoLax 17gm Powder as directed AS DIRECTED, Polyethylene Glycol 3350 3350 Powder TAKE AS DIRECTED , Chlorthalidone 25 MG Tablet TAKE 1 TABLET BY MOUTH EVERY MORNING , Metoprolol Tartrate 100 MG Tablet 1 tablet Once a day, Synthroid 175 MCG Tablet 1 tablet every morning on an empty stomach Once a day, Lantus SoloStar 100Units/ML Insulin 120 units Once a day at bedtime, Janumet XR 100/1000 mg 100 mg / 1000 mg Tablet 1 tablet every evening with food, Aspirin  81 MG Tablet Delayed Release 1 tablet Once a day, BuPROPion HCl 150 MG Tablet Extended Release 24 Hour TAKE 3 TABLETS BY MOUTH ONCE A DAY , Clonazepam 0.5 MG Tablet 0.5 tablet PRN, Medication List reviewed and reconciled with the patient     Allergies: Sulfa drugs (for allergy): hives.      Objective:    Vitals: Wt 227.6, Ht 66.5, BMI 36.18, Pulse sitting 74, BP sitting 130/88.     Examination:     Cardiology, General:         GENERAL APPEARANCE: pleasant, NAD.          HEENT: unremarkable.          CAROTID UPSTROKE: normal, no bruit.          JVD: flat.          HEART SOUNDS: regular, normal S1, S2, no S3 or S4.          MURMUR: absent.          LUNGS: no rales or wheezes.          ABDOMEN: soft, non tender, positive bowel sounds, no masses felt.          EXTREMITIES: no leg edema.          PERIPHERAL PULSES: 2 plus bilateral.             Assessment:    Assessment:  1. Chest pain - 786.50 (Primary)   2. Hypertension - 401.9   3. Abnormal cardiovascular function study - 794.30     Plan:    1. Chest pain  Start Aspirin EC Tablet Delayed Release, 325 MG, 1 tablet as needed, Orally, every 4 hrs ;  Start Nitroglycerin 0.4 mg tablet, 0.4 mg, 1 tablet as directed, SL, as directed prn chest pain, 30 days, 25, Refills 5 .         LAB: Basic Metabolic  elevated Calcium     GLUCOSE 88 70-99 - mg/dL        BUN 12 6-26 - mg/dL        CREATININE 0.83 0.60-1.30 - mg/dl        eGFR (NON-AFRICAN AMERICAN) 71 >60 - calc        eGFR (AFRICAN AMERICAN) 86 >60 - calc        SODIUM 140 136-145 - mmol/L        POTASSIUM 3.7 3.5-5.5 - mmol/L        CHLORIDE 99 98-107 - mmol/L        C02 32 22-32 - mg/dL        ANION GAP 13.6 6.0-20.0 - mmol/L         CALCIUM 10.4 8.6-10.3 - mg/dL H              AmeLie Hollars M 02/06/2012 08:14:02 PM > please forward to primary MD for elevated calcium Corson,Danielle 02/07/2012 08:40:35 AM > PT aware via VM per DPR, and forwarded to pts PCP. JOHNSON,MARTIN , III 02/07/2012 02:01:33 PM > perform fasting BMET and PTH level. Jones,Catrina 02/07/2012 04:36:24 PM > LMTC Jones,Catrina 02/08/2012 08:53:03 AM > Pt notified of results & next lab appt due on 02/09/2012 (AM-prior to procedure).        LAB: PT (Prothrombin Time) ([id number])  Normal     Prothrombin Time 10.3 9.1-12.0 - SEC        INR 1.0 0.8-1.2 -               Harward,Amy 02/07/2012 02:31:19 PM >  for cath [facility] M 02/07/2012 04:42:41 PM > Corson,Danielle 02/07/2012 04:53:56 PM > pt is aware        LAB: CBC with Diff  Normal     WBC 7.1 4.0-11.0 - K/ul        RBC 4.53 4.20-5.40 - M/uL        HGB 13.4 12.0-16.0 - g/dL        HCT 40.1 37.0-47.0 - %        MCH 29.6 27.0-33.0 - pg        MPV 8.2 7.5-10.7 - fL        MCV 88.7 81.0-99.0 - fL        MCHC 33.4 32.0-36.0 - g/dL        RDW 14.7 11.5-15.5 - %        NRBC# 0.00 -        PLT 314 150-400 -  K/uL        NEUT % 53.5 43.3-71.9 - %        NRBC% 0.00 - %        LYMPH% 38.1 16.8-43.5 - %        MONO % 7.0 4.6-12.4 - %        EOS % 1.0 0.0-7.8 - %        BASO % 0.4 0.0-1.0 - %        NEUT # 3.8 1.9-7.2 - K/uL        LYMPH# 2.70 1.10-2.70 - K/uL        MONO # 0.5 0.3-0.8 - K/uL        EOS # 0.1 0.0-0.6 - K/uL        BASO # 0.0 0.0-0.1 - K/uL               Giancarlos Berendt M 02/06/2012 08:13:09 PM > Corson,Danielle 02/07/2012 08:41:16 AM > pt aware.   Her symptoms are concerning for angina and in light of the abnormal nuclear stress I have recommended that we proceed with cardiac cath to further evaluate coronary anatomy. , Risks and benefits of cardiac catheterization have been reviewed including risk of stroke, heart attack, death, bleeding, renal impariment and arterial damage. There was ample oppurtuny to answer questions. Alternatives were discussed. Patient understands and wishes to proceed. I have had a lengthy discussion about the stress test results and the risks and benfits of cath and the patient wishes to proceed.          Immunizations:       Labs:      Procedure Codes: 02111 ECL BMP, 55208 ECL CBC PLATELET DIFF, 02233 BLOOD COLLECTION ROUTINE VENIPUNCTURE     Preventive:           Follow Up: cath        Provider: Fransico Him, MD  Patient: Meagan Allen, Meagan Allen  DOB: 1955-10-21  Date: 02/06/2012

## 2012-02-12 ENCOUNTER — Ambulatory Visit
Admission: RE | Admit: 2012-02-12 | Discharge: 2012-02-12 | Disposition: A | Discharge status: Home / Self Care | Payer: BC Managed Care – PPO | Source: Ambulatory Visit | Attending: Gastroenterology | Admitting: Gastroenterology

## 2012-02-12 ENCOUNTER — Ambulatory Visit: Payer: BC Managed Care – PPO

## 2012-02-12 DIAGNOSIS — Z1231 Encounter for screening mammogram for malignant neoplasm of breast: Secondary | ICD-10-CM

## 2012-03-25 DIAGNOSIS — M1710 Unilateral primary osteoarthritis, unspecified knee: Secondary | ICD-10-CM | POA: Insufficient documentation

## 2012-03-25 DIAGNOSIS — I10 Essential (primary) hypertension: Secondary | ICD-10-CM

## 2012-03-25 HISTORY — DX: Essential (primary) hypertension: I10

## 2012-08-16 ENCOUNTER — Other Ambulatory Visit: Payer: Self-pay | Admitting: Neurosurgery

## 2012-08-16 DIAGNOSIS — M792 Neuralgia and neuritis, unspecified: Secondary | ICD-10-CM

## 2012-08-20 ENCOUNTER — Ambulatory Visit
Admission: RE | Admit: 2012-08-20 | Discharge: 2012-08-20 | Disposition: A | Discharge status: Home / Self Care | Payer: BC Managed Care – PPO | Source: Ambulatory Visit | Attending: Neurosurgery | Admitting: Neurosurgery

## 2012-08-20 DIAGNOSIS — M792 Neuralgia and neuritis, unspecified: Secondary | ICD-10-CM

## 2012-09-19 ENCOUNTER — Other Ambulatory Visit: Payer: Self-pay | Admitting: Neurosurgery

## 2012-09-19 DIAGNOSIS — M25512 Pain in left shoulder: Secondary | ICD-10-CM

## 2012-10-21 ENCOUNTER — Other Ambulatory Visit: Payer: Self-pay | Admitting: Internal Medicine

## 2012-10-21 DIAGNOSIS — C73 Malignant neoplasm of thyroid gland: Secondary | ICD-10-CM

## 2012-10-30 ENCOUNTER — Ambulatory Visit
Admission: RE | Admit: 2012-10-30 | Discharge: 2012-10-30 | Disposition: A | Discharge status: Home / Self Care | Payer: BC Managed Care – PPO | Source: Ambulatory Visit | Attending: Internal Medicine | Admitting: Internal Medicine

## 2012-10-30 DIAGNOSIS — C73 Malignant neoplasm of thyroid gland: Secondary | ICD-10-CM

## 2012-11-21 ENCOUNTER — Other Ambulatory Visit: Payer: Self-pay

## 2012-11-21 DIAGNOSIS — Z1231 Encounter for screening mammogram for malignant neoplasm of breast: Secondary | ICD-10-CM

## 2013-01-14 LAB — HM DIABETES EYE EXAM

## 2013-02-05 ENCOUNTER — Other Ambulatory Visit: Payer: Self-pay | Admitting: Gastroenterology

## 2013-02-11 ENCOUNTER — Ambulatory Visit: Payer: BC Managed Care – PPO | Admitting: Adult Health

## 2013-02-12 ENCOUNTER — Ambulatory Visit
Admission: RE | Admit: 2013-02-12 | Discharge: 2013-02-12 | Disposition: A | Discharge status: Home / Self Care | Payer: BC Managed Care – PPO | Source: Ambulatory Visit

## 2013-02-12 DIAGNOSIS — Z1231 Encounter for screening mammogram for malignant neoplasm of breast: Secondary | ICD-10-CM

## 2013-02-25 ENCOUNTER — Other Ambulatory Visit: Payer: Self-pay | Admitting: Gastroenterology

## 2013-02-25 DIAGNOSIS — R1011 Right upper quadrant pain: Secondary | ICD-10-CM

## 2013-02-25 DIAGNOSIS — R110 Nausea: Secondary | ICD-10-CM

## 2013-02-28 ENCOUNTER — Ambulatory Visit
Admission: RE | Admit: 2013-02-28 | Discharge: 2013-02-28 | Disposition: A | Discharge status: Home / Self Care | Payer: BC Managed Care – PPO | Source: Ambulatory Visit | Attending: Gastroenterology | Admitting: Gastroenterology

## 2013-02-28 DIAGNOSIS — R110 Nausea: Secondary | ICD-10-CM

## 2013-02-28 DIAGNOSIS — R1011 Right upper quadrant pain: Secondary | ICD-10-CM

## 2013-03-03 ENCOUNTER — Ambulatory Visit (INDEPENDENT_AMBULATORY_CARE_PROVIDER_SITE_OTHER): Payer: BC Managed Care – PPO | Admitting: Adult Health

## 2013-03-03 ENCOUNTER — Encounter: Payer: Self-pay | Admitting: Adult Health

## 2013-03-03 VITALS — BP 122/78 | HR 84 | Temp 98.3°F | Resp 12 | Ht 67.5 in | Wt 223.0 lb

## 2013-03-03 DIAGNOSIS — R1011 Right upper quadrant pain: Secondary | ICD-10-CM

## 2013-03-03 DIAGNOSIS — F419 Anxiety disorder, unspecified: Secondary | ICD-10-CM | POA: Insufficient documentation

## 2013-03-03 DIAGNOSIS — F418 Other specified anxiety disorders: Secondary | ICD-10-CM | POA: Insufficient documentation

## 2013-03-03 DIAGNOSIS — G252 Other specified forms of tremor: Secondary | ICD-10-CM | POA: Insufficient documentation

## 2013-03-03 DIAGNOSIS — Z85850 Personal history of malignant neoplasm of thyroid: Secondary | ICD-10-CM | POA: Insufficient documentation

## 2013-03-03 DIAGNOSIS — R259 Unspecified abnormal involuntary movements: Secondary | ICD-10-CM

## 2013-03-03 DIAGNOSIS — F341 Dysthymic disorder: Secondary | ICD-10-CM

## 2013-03-03 NOTE — Assessment & Plan Note (Signed)
Patient is followed by endocrine (Dr. Buddy Duty). Request medical records.

## 2013-03-03 NOTE — Assessment & Plan Note (Signed)
Chronic pain worse post prandial. Pt reports recent ultrasound to evaluate pain. Recent EGD with normal findings. Request medical records.

## 2013-03-03 NOTE — Patient Instructions (Addendum)
   Thank you for choosing [facility] for your health care needs.  I am referring you to Neurology to evaluate the tremors in your left fingers and also the tingling in the right hand.  I am also referring you to Psychiatry, Dr. Nicolasa Ducking, for evaluation and management of your depression.  Please remember to activate your MyChart Account. The activation code is located at the end of this form.

## 2013-03-03 NOTE — Progress Notes (Signed)
Subjective:    Patient ID: Meagan Allen, female    DOB: October 10, 1955, 57 y.o.   MRN: [id number]  HPI  Patient is a 57 y/o female who presents to clinic to establish care. She is followed by Dr. Earle Allen at [facility] for GI symptoms. Recent EGD with normal findings. She is having an ultrasound for RUQ ongoing pain, vomiting and diarrhea. She also c/o chronic constipation. She has been taking Miralax but she feels this does not improve. Reports that she has gone 8 days, at times, without a bowel movement.   She has a hx of diabetes since 1996. She also has hx a thyroid cancer x 2 (2007, 2009) s/p radioactive iodine, right neck dissection. She is follow by endocrine (Dr. Louanna Allen).   She reports having balance problems for "a long time". She feels these have become worse in the last few weeks. She reports these occur with changing positions - moving from sitting to standing position. She feels that she walks into walls. Feels unsteady on her feet. She is also having resting tremors of the left hand but only involving the index and middle finger.     Past Medical History  Diagnosis Date  . Hypertension   . GERD (gastroesophageal reflux disease)   . IBS (irritable bowel syndrome)   . Fibromyalgia   . Anxiety   . Depression   . Rosacea   . Cancer     papillary thyroid CA with mets to cervial lymph nodes  . Ulcerative proctosigmoiditis   . Arthritis   . Diabetes mellitus   . Allergy   . Heart murmur   . Hyperlipidemia   . Migraine   . UTI (lower urinary tract infection)   . Colon polyps   . Hearing loss of right ear      Past Surgical History  Procedure Laterality Date  . Neck resection  08/2007  . Dilation and curettage of uterus  1984  . Thyroidectomy  2007 and 2009  . Giant cell tumor  2011    resected form right ankle  . Left knee surgery      x 2  . Joint replacement      bilateral thumb     Family History  Problem Relation Age of Onset  . Heart disease  Father     MI died age 22  . Hypertension Father   . Diabetes Father   . Arthritis Mother     rheumatoid athritis  . Stroke Mother   . Depression Mother   . Gout Brother   . Cancer Brother     thyroid CA  . Gout Brother   . Gout Brother   . Heart disease Sister     40 months old  . Asthma Son      History   Social History  . Marital Status: Married    Spouse Name: Meagan Allen    Number of Children: 2  . Years of Education: 12   Occupational History  . Banker for [employer] History Main Topics  . Smoking status: Former Smoker    Quit date: 07/17/1980  . Smokeless tobacco: Never Used  . Alcohol Use: No     Comment: Rare use  . Drug Use: No  . Sexual Activity: Yes    Birth Control/ Protection: Post-menopausal   Other Topics Concern  . Not on file   Social History Narrative   [name] was born in [place], [place]. She  moved to [place] in 1978 when her family moved to this state. Kree currently lives in [place] with her husband of 21 years. They have 2 adult children and 1 grandson. 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       Review of Systems  Constitutional: Positive for fatigue.  HENT: Positive for trouble swallowing.   Eyes:       Right tear duct blocked - followed at [facility].  Respiratory: Negative.   Cardiovascular: Positive for leg swelling. Negative for chest pain and palpitations.  Gastrointestinal: Positive for vomiting, abdominal pain, diarrhea and constipation. Negative for blood in stool.  Endocrine: Negative.   Genitourinary: Negative.   Musculoskeletal: Positive for gait problem. Negative for joint swelling.       Hx of giant cell tumor on left ankle. Removed by orthopedic.  Skin:       Patient is followed by [facility]. Last exam 04/2012.  Allergic/Immunologic:       Seasonal allergies. Take zyrtec seasonally and then prn.  Neurological: Positive for tremors and  headaches. Negative for light-headedness and numbness.       Right arm tingling - feeling of pins and needles.  Hematological: Negative.   Psychiatric/Behavioral: Positive for decreased concentration. Negative for behavioral problems, confusion and agitation. The patient is nervous/anxious.        Hx of depression. Currently on Wellbutrin.     BP 122/78  Pulse 84  Temp(Src) 98.3 F (36.8 C) (Oral)  Resp 12  Ht 5' 7.5" (1.715 m)  Wt 223 lb (101.152 kg)  BMI 34.39 kg/m2  SpO2 97%    Objective:   Physical Exam  Constitutional: She is oriented to person, place, and time. No distress.  Overweight  HENT:  Head: Normocephalic and atraumatic.  Right Ear: External ear normal.  Left Ear: External ear normal.  Mouth/Throat: No oropharyngeal exudate.  Eyes: Conjunctivae and EOM are normal. Pupils are equal, round, and reactive to light.  Neck: Normal range of motion. Neck supple. No tracheal deviation present.  Cardiovascular: Normal rate, regular rhythm, normal heart sounds and intact distal pulses.  Exam reveals no gallop and no friction rub.   No murmur heard. Pulmonary/Chest: Effort normal and breath sounds normal. No respiratory distress. She has no rales.  Abdominal: Soft. Bowel sounds are normal. There is tenderness.  RUQ tenderness.  Musculoskeletal: Normal range of motion. She exhibits no edema and no tenderness.  Lymphadenopathy:    She has no cervical adenopathy.  Neurological: She is alert and oriented to person, place, and time. She has normal reflexes. No cranial nerve deficit. Coordination normal.  Skin: Skin is warm.  Psychiatric: She has a normal mood and affect. Her behavior is normal. Judgment and thought content normal.      Assessment & Plan:

## 2013-03-03 NOTE — Assessment & Plan Note (Signed)
Patient has not had this evaluated. She is also experiencing tingling in the right arm. Will refer to neurology for evaluation.

## 2013-03-03 NOTE — Assessment & Plan Note (Signed)
Patient has been on wellbutrin for years. Not well controlled. She used to see a Social worker and psychiatrist. Hasn't it a while. Pt has been caring for her mother with RA. She is very distraught over watching her mother struggle with the pain and deterioration of her body. I am referring her to psychiatry for help with managing medication.

## 2013-03-04 ENCOUNTER — Encounter: Payer: Self-pay | Admitting: Adult Health

## 2013-03-18 ENCOUNTER — Encounter: Payer: Self-pay | Admitting: Emergency Medicine

## 2013-03-18 ENCOUNTER — Ambulatory Visit: Payer: Self-pay | Admitting: Neurology

## 2013-03-18 ENCOUNTER — Ambulatory Visit: Payer: BC Managed Care – PPO | Admitting: Adult Health

## 2013-03-27 ENCOUNTER — Other Ambulatory Visit: Payer: Self-pay | Admitting: Gastroenterology

## 2013-04-14 ENCOUNTER — Encounter (HOSPITAL_COMMUNITY): Payer: Self-pay | Admitting: *Deleted

## 2013-04-16 ENCOUNTER — Encounter (HOSPITAL_COMMUNITY): Payer: Self-pay | Admitting: Pharmacy Technician

## 2013-04-25 ENCOUNTER — Other Ambulatory Visit: Payer: Self-pay | Admitting: Internal Medicine

## 2013-04-25 ENCOUNTER — Ambulatory Visit
Admission: RE | Admit: 2013-04-25 | Discharge: 2013-04-25 | Disposition: A | Discharge status: Home / Self Care | Payer: BC Managed Care – PPO | Source: Ambulatory Visit | Attending: Internal Medicine | Admitting: Internal Medicine

## 2013-04-25 DIAGNOSIS — R0989 Other specified symptoms and signs involving the circulatory and respiratory systems: Secondary | ICD-10-CM

## 2013-04-25 DIAGNOSIS — R0609 Other forms of dyspnea: Secondary | ICD-10-CM

## 2013-05-06 ENCOUNTER — Ambulatory Visit (HOSPITAL_COMMUNITY)
Admission: RE | Admit: 2013-05-06 | Discharge: 2013-05-06 | Disposition: A | Discharge status: Home / Self Care | Payer: BC Managed Care – PPO | Source: Ambulatory Visit | Attending: Gastroenterology | Admitting: Gastroenterology

## 2013-05-06 ENCOUNTER — Encounter (HOSPITAL_COMMUNITY): Payer: BC Managed Care – PPO | Admitting: *Deleted

## 2013-05-06 ENCOUNTER — Encounter (HOSPITAL_COMMUNITY): Payer: Self-pay

## 2013-05-06 ENCOUNTER — Ambulatory Visit (HOSPITAL_COMMUNITY): Payer: BC Managed Care – PPO | Admitting: *Deleted

## 2013-05-06 ENCOUNTER — Encounter (HOSPITAL_COMMUNITY)
Admission: RE | Disposition: A | Discharge status: Home / Self Care | Payer: Self-pay | Source: Ambulatory Visit | Attending: Gastroenterology

## 2013-05-06 DIAGNOSIS — Z85850 Personal history of malignant neoplasm of thyroid: Secondary | ICD-10-CM | POA: Diagnosis not present

## 2013-05-06 DIAGNOSIS — E0789 Other specified disorders of thyroid: Secondary | ICD-10-CM | POA: Insufficient documentation

## 2013-05-06 DIAGNOSIS — K5130 Ulcerative (chronic) rectosigmoiditis without complications: Secondary | ICD-10-CM | POA: Diagnosis present

## 2013-05-06 DIAGNOSIS — K589 Irritable bowel syndrome without diarrhea: Secondary | ICD-10-CM | POA: Diagnosis not present

## 2013-05-06 DIAGNOSIS — D128 Benign neoplasm of rectum: Secondary | ICD-10-CM | POA: Insufficient documentation

## 2013-05-06 DIAGNOSIS — E119 Type 2 diabetes mellitus without complications: Secondary | ICD-10-CM | POA: Insufficient documentation

## 2013-05-06 DIAGNOSIS — K219 Gastro-esophageal reflux disease without esophagitis: Secondary | ICD-10-CM | POA: Diagnosis not present

## 2013-05-06 DIAGNOSIS — I10 Essential (primary) hypertension: Secondary | ICD-10-CM | POA: Diagnosis not present

## 2013-05-06 DIAGNOSIS — IMO0001 Reserved for inherently not codable concepts without codable children: Secondary | ICD-10-CM | POA: Diagnosis not present

## 2013-05-06 HISTORY — DX: Other specified postprocedural states: Z98.890

## 2013-05-06 HISTORY — PX: COLONOSCOPY WITH PROPOFOL: SHX5780

## 2013-05-06 HISTORY — DX: Other specified postprocedural states: R11.2

## 2013-05-06 LAB — GLUCOSE, CAPILLARY: Glucose-Capillary: 139 mg/dL — ABNORMAL HIGH (ref 70–99)

## 2013-05-06 SURGERY — COLONOSCOPY WITH PROPOFOL
Anesthesia: Monitor Anesthesia Care

## 2013-05-06 MED ORDER — SODIUM CHLORIDE 0.9 % IV SOLN
INTRAVENOUS | Status: DC
  Administered 2013-05-06: 10:00:00 via INTRAVENOUS

## 2013-05-06 MED ORDER — SCOPOLAMINE 1 MG/3DAYS TD PT72
MEDICATED_PATCH | TRANSDERMAL | Status: DC | PRN
  Administered 2013-05-06: 1 via TRANSDERMAL

## 2013-05-06 MED ORDER — KETAMINE HCL 10 MG/ML IJ SOLN
INTRAMUSCULAR | Status: DC | PRN
  Administered 2013-05-06: 25 mg via INTRAVENOUS

## 2013-05-06 MED ORDER — METOCLOPRAMIDE HCL 5 MG/ML IJ SOLN
INTRAMUSCULAR | Status: DC | PRN
  Administered 2013-05-06: 10 mg via INTRAVENOUS

## 2013-05-06 MED ORDER — DIPHENHYDRAMINE HCL 50 MG/ML IJ SOLN
INTRAMUSCULAR | Status: DC | PRN
  Administered 2013-05-06: 12.5 mg via INTRAVENOUS

## 2013-05-06 MED ORDER — LABETALOL HCL 5 MG/ML IV SOLN
INTRAVENOUS | Status: DC | PRN
  Administered 2013-05-06: 1.25 mg via INTRAVENOUS

## 2013-05-06 MED ORDER — LACTATED RINGERS IV SOLN: INTRAVENOUS | Status: DC

## 2013-05-06 MED ORDER — DEXAMETHASONE SODIUM PHOSPHATE 4 MG/ML IJ SOLN
INTRAMUSCULAR | Status: DC | PRN
  Administered 2013-05-06: 10 mg via INTRAVENOUS

## 2013-05-06 MED ORDER — ONDANSETRON HCL 4 MG/2ML IJ SOLN
INTRAMUSCULAR | Status: DC | PRN
  Administered 2013-05-06: 4 mg via INTRAVENOUS

## 2013-05-06 MED ORDER — PROPOFOL 10 MG/ML IV BOLUS
INTRAVENOUS | Status: DC | PRN
  Administered 2013-05-06: 50 mg via INTRAVENOUS

## 2013-05-06 MED ORDER — LACTATED RINGERS IV SOLN
INTRAVENOUS | Status: DC | PRN
  Administered 2013-05-06: 11:00:00 via INTRAVENOUS

## 2013-05-06 MED ORDER — PROPOFOL INFUSION 10 MG/ML OPTIME
INTRAVENOUS | Status: DC | PRN
  Administered 2013-05-06: 75 ug/kg/min via INTRAVENOUS

## 2013-05-06 MED ORDER — MIDAZOLAM HCL 5 MG/5ML IJ SOLN
INTRAMUSCULAR | Status: DC | PRN
  Administered 2013-05-06 (×2): 1 mg via INTRAVENOUS

## 2013-05-06 SURGICAL SUPPLY — 22 items

## 2013-05-06 NOTE — Anesthesia Preprocedure Evaluation (Signed)
Anesthesia Evaluation  Patient identified by MRN, date of birth, ID band Patient awake    Reviewed: Allergy & Precautions, H&P , NPO status , Patient's Chart, lab work & pertinent test results  History of Anesthesia Complications (+) PONV and history of anesthetic complications  Airway Mallampati: II TM Distance: >3 FB Neck ROM: Full    Dental no notable dental hx.    Pulmonary former smoker,  breath sounds clear to auscultation  Pulmonary exam normal       Cardiovascular hypertension, Pt. on medications Rhythm:Regular Rate:Normal     Neuro/Psych negative neurological ROS  negative psych ROS   GI/Hepatic Neg liver ROS, GERD-  ,  Endo/Other  diabetes, Type 2, Oral Hypoglycemic Agents and Insulin Dependent  Renal/GU negative Renal ROS  negative genitourinary   Musculoskeletal  (+) Fibromyalgia -  Abdominal   Peds negative pediatric ROS (+)  Hematology negative hematology ROS (+)   Anesthesia Other Findings   Reproductive/Obstetrics negative OB ROS                           Anesthesia Physical Anesthesia Plan  ASA: III  Anesthesia Plan: MAC   Post-op Pain Management:    Induction:   Airway Management Planned:   Additional Equipment:   Intra-op Plan:   Post-operative Plan:   Informed Consent: I have reviewed the patients History and Physical, chart, labs and discussed the procedure including the risks, benefits and alternatives for the proposed anesthesia with the patient or authorized representative who has indicated his/her understanding and acceptance.   Dental advisory given  Plan Discussed with: CRNA  Anesthesia Plan Comments:         Anesthesia Quick Evaluation

## 2013-05-06 NOTE — Anesthesia Postprocedure Evaluation (Signed)
  Anesthesia Post-op Note  Patient: Meagan Allen  Procedure(s) Performed: Procedure(s) (LRB): COLONOSCOPY WITH PROPOFOL (N/A)  Patient Location: PACU  Anesthesia Type: MAC  Level of Consciousness: awake and alert   Airway and Oxygen Therapy: Patient Spontanous Breathing  Post-op Pain: mild  Post-op Assessment: Post-op Vital signs reviewed, Patient's Cardiovascular Status Stable, Respiratory Function Stable, Patent Airway and No signs of Nausea or vomiting  Last Vitals:  Filed Vitals:   05/06/13 1126  BP: 129/75  Pulse:   Temp:   Resp: 18    Post-op Vital Signs: stable   Complications: No apparent anesthesia complications

## 2013-05-06 NOTE — Transfer of Care (Signed)
Immediate Anesthesia Transfer of Care Note  Patient: Meagan Allen  Procedure(s) Performed: Procedure(s): COLONOSCOPY WITH PROPOFOL (N/A)  Patient Location: PACU  Anesthesia Type:MAC  Level of Consciousness: Patient easily awoken, sedated, comfortable, cooperative, following commands, responds to stimulation.   Airway & Oxygen Therapy: Patient spontaneously breathing, ventilating well, oxygen via simple oxygen mask.  Post-op Assessment: Report given to PACU RN, vital signs reviewed and stable, moving all extremities.   Post vital signs: Reviewed and stable.  Complications: No apparent anesthesia complications

## 2013-05-06 NOTE — Op Note (Signed)
Problem: Chronic ulcerative proctosigmoiditis.  Endoscopist: Earle Gell  Premedication: Propofol administered by anesthesia  Procedure: Surveillance colonoscopy The patient was placed in the left lateral decubitus position. Anal inspection and digital rectal exam were normal. The Pentax pediatric colonoscope was introduced into the rectum and advanced to the cecum. A normal-appearing ileocecal valve and appendiceal orifice were identified. Colonic preparation for the exam today was good.  Rectum. There was mild ulcerative proctitis involving the distal rectum only without deep ulcerations. The proximal rectum appeared normal.  Sigmoid colon and descending colon. Normal.  Splenic flexure. Normal.  Transverse colon. Normal.  Hepatic flexure. Normal.  Ascending colon. Normal.  Cecum and ileocecal valve. Normal.  Assessment  #1. Mild distal proctitis  #2. Otherwise normal proctocolonoscopy to the cecum  #3. A total of 32 biopsies were performed along the length of the colon and rectum. 8 biopsies were performed from the right colon. 8 biopsies were performed from the transverse colon. 8 biopsies were performed from the descending colon. 8 biopsies were performed from the rectosigmoid colon.

## 2013-05-06 NOTE — Preoperative (Signed)
Beta Blockers   Reason not to administer Beta Blockers:Not Applicable, BB given intra op

## 2013-05-06 NOTE — H&P (Signed)
  Problem: Ulcerative proctosigmoiditis  History to the patient is a 57 year old female born May 06, 1956. The patient has chronic ulcerative proctosigmoiditis. She is scheduled to undergo a surveillance colonoscopy today.  Past medical history: Type 2 diabetes mellitus. Hypertension. Ulcerative proctosigmoiditis. Gastroesophageal reflux. Irritable bowel syndrome. Fibromyalgia syndrome. Anxiety with depression. Rosacea. Papillary carcinoma of the thyroid. Thyroidectomy.  Left knee surgery.  Allergies: Sulfa  Exam: The patient is alert and lying comfortably on the endoscopy stretcher. Lungs are clear to auscultation. Abdomen is soft and nontender to palpation. Lungs are clear to auscultation.  Plan: Proceed with surveillance colonoscopy.

## 2013-05-07 ENCOUNTER — Encounter (HOSPITAL_COMMUNITY): Payer: Self-pay | Admitting: Gastroenterology

## 2013-05-22 ENCOUNTER — Other Ambulatory Visit: Payer: Self-pay

## 2013-11-07 ENCOUNTER — Ambulatory Visit (INDEPENDENT_AMBULATORY_CARE_PROVIDER_SITE_OTHER): Payer: 59

## 2013-11-07 VITALS — BP 134/86 | HR 87 | Resp 16 | Ht 66.0 in | Wt 235.0 lb

## 2013-11-07 DIAGNOSIS — E119 Type 2 diabetes mellitus without complications: Secondary | ICD-10-CM

## 2013-11-07 DIAGNOSIS — M79609 Pain in unspecified limb: Secondary | ICD-10-CM

## 2013-11-07 DIAGNOSIS — B070 Plantar wart: Secondary | ICD-10-CM

## 2013-11-07 NOTE — Progress Notes (Signed)
   Subjective:    Patient ID: Meagan Allen, female    DOB: 10/08/1955, 58 y.o.   MRN: [id number]  HPI Comments: N foreign body L right plantar lateral midfoot D 2 months or more O pt remembers walking in the kitchen and feeling something in her foot C sharp pain A pressure of walking T husband tried home surgery     Review of Systems  Constitutional: Positive for fatigue.  HENT: Positive for hearing loss.   Eyes: Positive for visual disturbance.  Respiratory: Positive for chest tightness.   Cardiovascular: Positive for palpitations.  Musculoskeletal: Positive for arthralgias, gait problem and myalgias.  Neurological: Positive for headaches.  All other systems reviewed and are negative.      Objective:   Physical Exam 58 year old white female well-developed well-nourished return 3 presents this time with a complaint of painful lesion which she thought was peas or glasses she stepped on the bottom of her right foot is happening couple months ago but never any bleeding the patient is a pleasant last to locate objective findings this time vascular status is intact pedal pulses palpable DP postal for PT +2/4 bilateral Or refill time 3 seconds all digits epicritic and proprioceptive sensations intact and symmetric bilateral normal plantar response and DTRs noted dermatologic the skin color pigment normal hair growth diminished absent distally nails criptotic incurvated and friable there is a nucleated keratotic lesion sub-fifth metatarsal base lateral midfoot of the right foot. Is also a second small lesion in the inferior arch and a small lesion sub-fifth MTP area on the contralateral left foot. X-rays taken at this time reveal no cysts foreign body no visual radiopaque substance in the skin inferior to the foot and arch area lesion marker was utilized in no foreign body could be identified around palpation there is pain on direct lateral compression over the area consistent with that of a  foreign body or severe verruca plantaris.       Assessment & Plan:  Assessment this time his diabetes without complications possible suspect verruca plantaris versus porokeratosis plantar foot right foot into adjacent lesions were also identified at this time patient was unaware of although not as thick or severe this time lesion is debrided pack to 10% salicylic acid under occlusion for 24 hours patient issued as needed lesions or exacerbations is given instruction sheet for topical salicylic acid and duct tape for occlusion however we'll do this for short durations of time she has a further problems or further problems or exacerbations will follow up in the future as needed following debridement almost immediate relief from the pain in symptomology from compression of the area.  Harriet Masson DPM

## 2013-11-07 NOTE — Patient Instructions (Signed)
WARTS (Verrucae)  Warts are caused by a virus that has invaded the skin.  They are more common in young adults and children and a small percentage will resolve on their own.  There are many types of warts including mosaic warts (large flat), vulgaris (domed warts-have pearl like appearance), and plantar warts (flat or cauliflower like appearance).  Warts are highly contagious and may be picked up from any surface.  Warts thrive in a warm moist environment and are common near pools, showers, and locker room floors.  Any microscopic cut in the skin is where the virus enters and becomes a wart.  Warts are very difficult to treat and get rid of.  Patience is necessary in the treatment of this virus.  It may take months to cure and different methods may have to be used to get rid of your wart.  Standard Initial Treatment is: 1. Periodic debridement of the wart and application of Canthacur to each lesion (a blistering agent that will slough off the warty skin) 2. Dispensing of topical treatments/prescriptions to apply to the wart at home  Other options include: 1. Excision of the lesion-numbing the skin around the wart and cutting it out-requires daily soaks post-operatively and takes about 2-3 weeks to fully heal 2. Excision with CO2 Laser-Performed at the surgical center your foot is numbed up and the lesions are all cut out and then lasered with a high power laser.  Very good for multiple warts that are resistant. 3. Cimetidine (Tagamet)-Oral agent used in high does--has shown better results in children  How do I apply the standard topical treatments?  1. Salicylic Acid (Compound W wart remover liquid or gel-available at drug or grocery stores)-Apply a dime size thickness over the wart and cover with duct tape-apply at night so the medication does not spread out to the good skin.  The skin will turn white and slowly blister off.  Use a pumice stone daily to remove the white skin as best you can.  If  the skin gets too raw and painful, discontinue for a few days then resume. 2. Aldara (Imiquimod)-this is an immune response modifier.  They come in little packets so try to get at least 2 days out of each packet if you can.  Apply a small amount to the lesion and cover with duct tape.  Do not rub it in-let it absorb on its own.  Good to apply each morning.  Other Helpful Hints:  Wash shoes that can be washed in the washing machine 2-3 x per month with some bleach  Use Lysol in shoes that cannot be washed and wipe out with a cloth 1 x per week-allow to dry for 8 hours before wearing again  Use a bleach solution (1 part bleach to 3 parts water) in your tub or shower to reduce the spread of the virus to yourself and others  Use aqua socks or clean sandals when at the pool or locker room to reduce the chance of picking up the virus or spreading it to others

## 2013-11-13 ENCOUNTER — Other Ambulatory Visit: Payer: Self-pay | Admitting: Gastroenterology

## 2013-11-13 DIAGNOSIS — R110 Nausea: Secondary | ICD-10-CM

## 2013-11-19 ENCOUNTER — Inpatient Hospital Stay: Admission: RE | Admit: 2013-11-19 | Payer: BC Managed Care – PPO | Source: Ambulatory Visit

## 2013-11-21 ENCOUNTER — Ambulatory Visit
Admission: RE | Admit: 2013-11-21 | Discharge: 2013-11-21 | Disposition: A | Discharge status: Home / Self Care | Payer: 59 | Source: Ambulatory Visit | Attending: Gastroenterology | Admitting: Gastroenterology

## 2013-11-21 DIAGNOSIS — R110 Nausea: Secondary | ICD-10-CM

## 2014-01-05 ENCOUNTER — Other Ambulatory Visit: Payer: Self-pay | Admitting: Gastroenterology

## 2014-01-05 ENCOUNTER — Ambulatory Visit
Admission: RE | Admit: 2014-01-05 | Discharge: 2014-01-05 | Disposition: A | Discharge status: Home / Self Care | Payer: 59 | Source: Ambulatory Visit | Attending: Gastroenterology | Admitting: Gastroenterology

## 2014-01-05 DIAGNOSIS — R079 Chest pain, unspecified: Secondary | ICD-10-CM

## 2014-01-09 ENCOUNTER — Other Ambulatory Visit: Payer: Self-pay

## 2014-01-09 DIAGNOSIS — Z1231 Encounter for screening mammogram for malignant neoplasm of breast: Secondary | ICD-10-CM

## 2014-02-13 ENCOUNTER — Ambulatory Visit
Admission: RE | Admit: 2014-02-13 | Discharge: 2014-02-13 | Disposition: A | Discharge status: Home / Self Care | Payer: 59 | Source: Ambulatory Visit

## 2014-02-13 DIAGNOSIS — Z1231 Encounter for screening mammogram for malignant neoplasm of breast: Secondary | ICD-10-CM

## 2014-04-09 ENCOUNTER — Other Ambulatory Visit: Payer: Self-pay | Admitting: Internal Medicine

## 2014-04-09 ENCOUNTER — Ambulatory Visit
Admission: RE | Admit: 2014-04-09 | Discharge: 2014-04-09 | Disposition: A | Discharge status: Home / Self Care | Payer: 59 | Source: Ambulatory Visit | Attending: Internal Medicine | Admitting: Internal Medicine

## 2014-04-09 DIAGNOSIS — R05 Cough: Secondary | ICD-10-CM

## 2014-04-09 DIAGNOSIS — R058 Other specified cough: Secondary | ICD-10-CM

## 2014-04-24 ENCOUNTER — Encounter: Payer: Self-pay | Admitting: *Deleted

## 2014-05-21 ENCOUNTER — Emergency Department (HOSPITAL_COMMUNITY): Payer: 59

## 2014-05-21 ENCOUNTER — Emergency Department (HOSPITAL_COMMUNITY)
Admission: EM | Admit: 2014-05-21 | Discharge: 2014-05-21 | Disposition: A | Discharge status: Home / Self Care | Payer: 59 | Attending: Emergency Medicine | Admitting: Emergency Medicine

## 2014-05-21 ENCOUNTER — Encounter (HOSPITAL_COMMUNITY): Payer: Self-pay | Admitting: Emergency Medicine

## 2014-05-21 DIAGNOSIS — G43909 Migraine, unspecified, not intractable, without status migrainosus: Secondary | ICD-10-CM | POA: Diagnosis not present

## 2014-05-21 DIAGNOSIS — K219 Gastro-esophageal reflux disease without esophagitis: Secondary | ICD-10-CM | POA: Insufficient documentation

## 2014-05-21 DIAGNOSIS — Z79899 Other long term (current) drug therapy: Secondary | ICD-10-CM | POA: Diagnosis not present

## 2014-05-21 DIAGNOSIS — E119 Type 2 diabetes mellitus without complications: Secondary | ICD-10-CM | POA: Insufficient documentation

## 2014-05-21 DIAGNOSIS — Z8739 Personal history of other diseases of the musculoskeletal system and connective tissue: Secondary | ICD-10-CM | POA: Insufficient documentation

## 2014-05-21 DIAGNOSIS — R110 Nausea: Secondary | ICD-10-CM | POA: Insufficient documentation

## 2014-05-21 DIAGNOSIS — F329 Major depressive disorder, single episode, unspecified: Secondary | ICD-10-CM | POA: Insufficient documentation

## 2014-05-21 DIAGNOSIS — I10 Essential (primary) hypertension: Secondary | ICD-10-CM | POA: Insufficient documentation

## 2014-05-21 DIAGNOSIS — Z872 Personal history of diseases of the skin and subcutaneous tissue: Secondary | ICD-10-CM | POA: Insufficient documentation

## 2014-05-21 DIAGNOSIS — Z87891 Personal history of nicotine dependence: Secondary | ICD-10-CM | POA: Diagnosis not present

## 2014-05-21 DIAGNOSIS — Z86010 Personal history of colonic polyps: Secondary | ICD-10-CM | POA: Insufficient documentation

## 2014-05-21 DIAGNOSIS — R002 Palpitations: Secondary | ICD-10-CM | POA: Diagnosis present

## 2014-05-21 DIAGNOSIS — H9191 Unspecified hearing loss, right ear: Secondary | ICD-10-CM | POA: Insufficient documentation

## 2014-05-21 DIAGNOSIS — Z85850 Personal history of malignant neoplasm of thyroid: Secondary | ICD-10-CM | POA: Insufficient documentation

## 2014-05-21 DIAGNOSIS — R000 Tachycardia, unspecified: Secondary | ICD-10-CM

## 2014-05-21 DIAGNOSIS — Z9889 Other specified postprocedural states: Secondary | ICD-10-CM | POA: Insufficient documentation

## 2014-05-21 DIAGNOSIS — F419 Anxiety disorder, unspecified: Secondary | ICD-10-CM | POA: Diagnosis not present

## 2014-05-21 DIAGNOSIS — Z87440 Personal history of urinary (tract) infections: Secondary | ICD-10-CM | POA: Insufficient documentation

## 2014-05-21 DIAGNOSIS — R011 Cardiac murmur, unspecified: Secondary | ICD-10-CM | POA: Diagnosis not present

## 2014-05-21 DIAGNOSIS — Z794 Long term (current) use of insulin: Secondary | ICD-10-CM | POA: Diagnosis not present

## 2014-05-21 LAB — CBC WITH DIFFERENTIAL/PLATELET
Basophils Absolute: 0 10*3/uL (ref 0.0–0.1)
Basophils Relative: 0 % (ref 0–1)
Eosinophils Absolute: 0.1 10*3/uL (ref 0.0–0.7)
Eosinophils Relative: 2 % (ref 0–5)
HCT: 39.9 % (ref 36.0–46.0)
Hemoglobin: 14.1 g/dL (ref 12.0–15.0)
Lymphocytes Relative: 43 % (ref 12–46)
Lymphs Abs: 3.2 10*3/uL (ref 0.7–4.0)
MCH: 30 pg (ref 26.0–34.0)
MCHC: 35.3 g/dL (ref 30.0–36.0)
MCV: 84.9 fL (ref 78.0–100.0)
Monocytes Absolute: 0.5 10*3/uL (ref 0.1–1.0)
Monocytes Relative: 7 % (ref 3–12)
Neutro Abs: 3.6 10*3/uL (ref 1.7–7.7)
Neutrophils Relative %: 48 % (ref 43–77)
Platelets: 321 10*3/uL (ref 150–400)
RBC: 4.7 MIL/uL (ref 3.87–5.11)
RDW: 14.6 % (ref 11.5–15.5)
WBC: 7.5 10*3/uL (ref 4.0–10.5)

## 2014-05-21 LAB — COMPREHENSIVE METABOLIC PANEL
ALT: 59 U/L — ABNORMAL HIGH (ref 0–35)
AST: 35 U/L (ref 0–37)
Albumin: 4.4 g/dL (ref 3.5–5.2)
Alkaline Phosphatase: 114 U/L (ref 39–117)
Anion gap: 16 — ABNORMAL HIGH (ref 5–15)
BUN: 16 mg/dL (ref 6–23)
CO2: 28 mEq/L (ref 19–32)
Calcium: 10.7 mg/dL — ABNORMAL HIGH (ref 8.4–10.5)
Chloride: 96 mEq/L (ref 96–112)
Creatinine, Ser: 1.01 mg/dL (ref 0.50–1.10)
GFR calc Af Amer: 69 mL/min — ABNORMAL LOW (ref >90)
GFR calc non Af Amer: 60 mL/min — ABNORMAL LOW (ref >90)
Glucose, Bld: 183 mg/dL — ABNORMAL HIGH (ref 70–99)
Potassium: 3.2 mEq/L — ABNORMAL LOW (ref 3.7–5.3)
Sodium: 140 mEq/L (ref 137–147)
Total Bilirubin: 0.4 mg/dL (ref 0.3–1.2)
Total Protein: 7.7 g/dL (ref 6.0–8.3)

## 2014-05-21 LAB — I-STAT TROPONIN, ED: Troponin i, poc: 0 ng/mL (ref 0.00–0.08)

## 2014-05-21 LAB — D-DIMER, QUANTITATIVE: D-Dimer, Quant: 0.27 ug/mL-FEU (ref 0.00–0.48)

## 2014-05-21 MED ORDER — POTASSIUM CHLORIDE CRYS ER 20 MEQ PO TBCR
40.0000 meq | EXTENDED_RELEASE_TABLET | Freq: Once | ORAL | Status: AC
  Administered 2014-05-21: 40 meq via ORAL
  Filled 2014-05-21: qty 2

## 2014-05-21 NOTE — ED Provider Notes (Signed)
CSN: [id number]     Arrival date & time 05/21/14  2046 History   First MD Initiated Contact with Patient 05/21/14 2126     Chief Complaint  Patient presents with  . Palpitations     (Consider location/radiation/quality/duration/timing/severity/associated sxs/prior Treatment) Patient is a 58 y.o. female presenting with palpitations. The history is provided by the patient. No language interpreter was used.  Palpitations Palpitations quality:  Irregular Onset quality:  Gradual Duration:  1 day (intermittently today, episodes over the last month) Timing:  Intermittent Progression:  Waxing and waning Chronicity:  New Context: not anxiety, not exercise, not hyperventilation, not illicit drugs, not nicotine and not stimulant use   Relieved by:  Nothing Worsened by:  Nothing tried Associated symptoms: nausea   Associated symptoms: no back pain, no chest pain, no chest pressure, no diaphoresis, no dizziness, no lower extremity edema, no near-syncope, no shortness of breath, no vomiting and no weakness   Risk factors: diabetes mellitus   Risk factors: no heart disease, no hx of atrial fibrillation, no hx of PE, no hyperthyroidism and no stress     Past Medical History  Diagnosis Date  . Hypertension   . GERD (gastroesophageal reflux disease)   . IBS (irritable bowel syndrome)   . Fibromyalgia   . Anxiety   . Depression   . Rosacea   . Ulcerative proctosigmoiditis   . Arthritis   . Diabetes mellitus   . Allergy   . Heart murmur   . Hyperlipidemia   . Migraine   . UTI (lower urinary tract infection)     none recent  . Colon polyps   . Hearing loss of right ear   . Cancer 2007, 2009    papillary thyroid CA with mets to cervial lymph nodes  . PONV (postoperative nausea and vomiting)    Past Surgical History  Procedure Laterality Date  . Neck resection  08/2007  . Dilation and curettage of uterus  1984  . Thyroidectomy  2007 and 2009  . Giant cell tumor  2011    resected form  right ankle  . Left knee surgery      x 2, torn meniscus  . Joint replacement      bilateral thumb  . Cardiac catheterization  02-09-2012    results in epic, no ekg found  . Colonoscopy with propofol N/A 05/06/2013    Procedure: COLONOSCOPY WITH PROPOFOL;  Surgeon: Garlan Fair, MD;  Location: [facility];  Service: Endoscopy;  Laterality: N/A;   Family History  Problem Relation Age of Onset  . Heart disease Father     MI died age 48  . Hypertension Father   . Diabetes Father   . Arthritis Mother     rheumatoid athritis  . Stroke Mother   . Depression Mother   . Gout Brother   . Cancer Brother     thyroid CA  . Gout Brother   . Gout Brother   . Heart disease Sister     77 months old  . Asthma Son    History  Substance Use Topics  . Smoking status: Former Smoker -- 3.00 packs/day for 10 years    Types: Cigarettes    Quit date: 07/17/1980  . Smokeless tobacco: Never Used  . Alcohol Use: No     Comment: Rare use   OB History    No data available     Review of Systems  Constitutional: Negative for fever and diaphoresis.  Respiratory: Negative for  chest tightness and shortness of breath.   Cardiovascular: Positive for palpitations. Negative for chest pain, leg swelling and near-syncope.  Gastrointestinal: Positive for nausea. Negative for vomiting.  Musculoskeletal: Negative for back pain.  Neurological: Negative for dizziness, syncope, weakness and light-headedness.  All other systems reviewed and are negative.     Allergies  Sulfa antibiotics  Home Medications   Prior to Admission medications   Medication Sig Start Date End Date Taking? Authorizing Provider  buPROPion (WELLBUTRIN XL) 150 MG 24 hr tablet Take 450 mg by mouth every morning.  01/21/13   Historical Provider, MD  cetirizine (ZYRTEC) 10 MG tablet Take 10 mg by mouth daily.    Historical Provider, MD  chlorthalidone (HYGROTON) 25 MG tablet Take 25 mg by mouth every morning.     Historical  Provider, MD  Cholecalciferol (VITAMIN D) 2000 UNITS tablet Take 2,000 Units by mouth daily.    Historical Provider, MD  clonazePAM (KLONOPIN) 0.5 MG tablet Take 0.5 mg by mouth at bedtime as needed (sleep).     Historical Provider, MD  insulin aspart (NOVOLOG) 100 UNIT/ML injection Inject into the skin once.    Historical Provider, MD  insulin glargine (LANTUS) 100 UNIT/ML injection Inject 160 Units into the skin at bedtime.    Historical Provider, MD  levothyroxine (SYNTHROID, LEVOTHROID) 175 MCG tablet Take 175 mcg by mouth daily before breakfast.    Historical Provider, MD  Magnesium 250 MG TABS Take 1 tablet by mouth daily.    Historical Provider, MD  methocarbamol (ROBAXIN) 500 MG tablet Take 500 mg by mouth every morning.  01/09/13   Historical Provider, MD  metoprolol (LOPRESSOR) 100 MG tablet Take 100 mg by mouth every morning.     Historical Provider, MD  Misc Natural Products (COSAMIN ASU ADVANCED FORMULA) CAPS Take 1 capsule by mouth daily.    Historical Provider, MD  omeprazole (PRILOSEC) 20 MG capsule Take 20 mg by mouth daily.    Historical Provider, MD  polyethylene glycol powder (GLYCOLAX/MIRALAX) powder Take 17 g by mouth daily.  02/24/13   Historical Provider, MD  potassium chloride (K-DUR) 10 MEQ tablet Take 20 mEq by mouth daily.    Historical Provider, MD  SitaGLIPtin-MetFORMIN HCl (JANUMET XR) [phone] MG TB24 Take 1 tablet by mouth daily.    Historical Provider, MD  vitamin B-12 (CYANOCOBALAMIN) 1000 MCG tablet Take 1,000 mcg by mouth daily.    Historical Provider, MD   BP 153/92 mmHg  Pulse 106  Temp(Src) 98.1 F (36.7 C) (Oral)  Resp 18  Ht 5' 6"  (1.676 m)  Wt 232 lb (105.235 kg)  BMI 37.46 kg/m2  SpO2 98% Physical Exam  Constitutional: She is oriented to person, place, and time. She appears well-developed and well-nourished. No distress.  HENT:  Head: Normocephalic.  Neck: No tracheal deviation present. No thyromegaly present.  Cardiovascular: Normal rate,  regular rhythm, normal heart sounds and intact distal pulses.   Pulmonary/Chest: Effort normal and breath sounds normal.  Abdominal: Soft. She exhibits no distension. There is no tenderness.  Musculoskeletal: She exhibits no edema.  Neurological: She is alert and oriented to person, place, and time.  Skin: Skin is warm.  Vitals reviewed.   ED Course  Procedures (including critical care time) Labs Review Labs Reviewed  COMPREHENSIVE METABOLIC PANEL - Abnormal; Notable for the following:    Potassium 3.2 (*)    Glucose, Bld 183 (*)    Calcium 10.7 (*)    ALT 59 (*)    GFR calc  non Af Amer 60 (*)    GFR calc Af Amer 69 (*)    Anion gap 16 (*)    All other components within normal limits  CBC WITH DIFFERENTIAL  D-DIMER, QUANTITATIVE  I-STAT TROPOININ, ED    Imaging Review Dg Chest 2 View  05/21/2014   CLINICAL DATA:  Palpitations in the chest fell throughout the day. Similar episodes over the past month.  EXAM: CHEST  2 VIEW  COMPARISON:  04/09/2014  FINDINGS: The heart size and mediastinal contours are within normal limits. Both lungs are clear. The visualized skeletal structures are unremarkable.  IMPRESSION: No active cardiopulmonary disease.   Electronically Signed   By: Lucienne Capers M.D.   On: 05/21/2014 21:39     EKG Interpretation None      MDM   Final diagnoses:  Palpitations    57 y/o female with history of IDDM, prior thyroid cancer s/p RAI ablation (cancer free >5 yrs), anxiety presenting with palpitations. Intermittent over the last month. Associated with nausea but no chest pain, SOB, diaphoresis. No recent illness or history of a fib. Well appearing on exam. Took klonopin prior to arrival. HR now 80-90s. EKG without ischemic changes, sinus rhythm. Negative troponin and d dimer. Low suspicion for PE given no respiratory symptoms or hypoxia. Also no risk factors but unable to [facility] due to age. Labs remarkable for mild hypokalemia. Pt reports she is chronically  low and on PO K daily. No EKG changes of hypokalemia. Currently asymptomatic. Doubt atypical ACS presentation. Could benefit from Holter monitor given intermittent but recurrent symptoms. Will f/u with PCP. ED return precautions discussed and pt in agreement with plan. All questions answered.     Amparo Bristol, MD 05/22/14 [place], MD 05/25/14 2113

## 2014-05-21 NOTE — ED Notes (Signed)
Pt presents with palpitations in her chest that she has felt throughout the day, admits to feeling sweaty as well- denies chest pain or SOB.  Pt admits to having similar episodes for the past month but has not yet been evaluated.   '

## 2014-12-29 ENCOUNTER — Other Ambulatory Visit: Payer: Self-pay | Admitting: Internal Medicine

## 2014-12-29 DIAGNOSIS — R109 Unspecified abdominal pain: Secondary | ICD-10-CM

## 2015-01-04 ENCOUNTER — Ambulatory Visit
Admission: RE | Admit: 2015-01-04 | Discharge: 2015-01-04 | Disposition: A | Discharge status: Home / Self Care | Payer: BLUE CROSS/BLUE SHIELD | Source: Ambulatory Visit | Attending: Internal Medicine | Admitting: Internal Medicine

## 2015-01-04 DIAGNOSIS — R109 Unspecified abdominal pain: Secondary | ICD-10-CM

## 2015-01-11 ENCOUNTER — Other Ambulatory Visit: Payer: Self-pay

## 2015-02-11 ENCOUNTER — Other Ambulatory Visit: Payer: Self-pay | Admitting: Gastroenterology

## 2015-02-11 DIAGNOSIS — R2689 Other abnormalities of gait and mobility: Secondary | ICD-10-CM

## 2015-02-16 ENCOUNTER — Ambulatory Visit
Admission: RE | Admit: 2015-02-16 | Discharge: 2015-02-16 | Disposition: A | Discharge status: Home / Self Care | Payer: BLUE CROSS/BLUE SHIELD | Source: Ambulatory Visit | Attending: Gastroenterology | Admitting: Gastroenterology

## 2015-02-16 DIAGNOSIS — R2689 Other abnormalities of gait and mobility: Secondary | ICD-10-CM

## 2015-02-16 MED ORDER — GADOBENATE DIMEGLUMINE 529 MG/ML IV SOLN
20.0000 mL | Freq: Once | INTRAVENOUS | Status: AC | PRN
  Administered 2015-02-16: 20 mL via INTRAVENOUS

## 2015-06-15 ENCOUNTER — Ambulatory Visit: Payer: BLUE CROSS/BLUE SHIELD | Attending: Neurology | Admitting: Physical Therapy

## 2015-06-15 ENCOUNTER — Encounter: Payer: Self-pay | Admitting: Physical Therapy

## 2015-06-15 DIAGNOSIS — R262 Difficulty in walking, not elsewhere classified: Secondary | ICD-10-CM | POA: Diagnosis not present

## 2015-06-15 NOTE — Therapy (Signed)
[place] MAIN [facility] SERVICES [address] [place], [place], [address] Phone: [phone]   Fax:  [phone]  Physical Therapy Evaluation  Patient Details  Name: Meagan Allen MRN: [id number] Date of Birth: [birth year] Referring Provider: Dr. Melrose Nakayama  Encounter Date: 06/15/2015      PT End of Session - 06/15/15 1718    Visit Number 1   Number of Visits 25   PT Start Time 0500   PT Stop Time 0600   PT Time Calculation (min) 60 min   Equipment Utilized During Treatment Gait belt   Activity Tolerance Patient tolerated treatment well   Behavior During Therapy [facility] for tasks assessed/performed      Past Medical History  Diagnosis Date  . Hypertension   . GERD (gastroesophageal reflux disease)   . IBS (irritable bowel syndrome)   . Fibromyalgia   . Anxiety   . Depression   . Rosacea   . 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[place])   . Arthritis   . Diabetes mellitus   . Allergy   . Heart murmur   . Hyperlipidemia   . Migraine   . UTI (lower urinary tract infection)     none recent  . Colon polyps   . Hearing loss of right ear   . Cancer ([place]) 2007, 2009    papillary thyroid CA with mets to cervial lymph nodes  . PONV (postoperative nausea and vomiting)     Past Surgical History  Procedure Laterality Date  . Neck resection  08/2007  . Dilation and curettage of uterus  1984  . Thyroidectomy  2007 and 2009  . Giant cell tumor  2011    resected form right ankle  . Left knee surgery      x 2, torn meniscus  . Joint replacement      bilateral thumb  . Cardiac catheterization  02-09-2012    results in epic, no ekg found  . Colonoscopy with propofol N/A 05/06/2013    Procedure: COLONOSCOPY WITH PROPOFOL;  Surgeon: Garlan Fair, MD;  Location: [facility];  Service: Endoscopy;  Laterality: N/A;    There were no vitals filed for this visit.  Visit Diagnosis:  Difficulty walking      Subjective Assessment - 06/15/15 1713    Subjective Patient is having difficulty with balance.             PAIN:  Patient has neck and shoulder pain that is constant POSTURE: WFL   PROM/AROM:  STRENGTH:  Graded on a 0-5 scale Muscle Group Left Right  Shoulder flex    Shoulder Abd    Shoulder Ext    Shoulder IR/ER    Elbow    Wrist/hand    Hip Flex 4 4  Hip Abd 3 3  Hip Add 2 2  Hip Ext 2 2  Hip IR/ER 3 3  Knee Flex 4 4  Knee Ext 4 4  Ankle DF 4 4  Ankle PF 4 4   SENSATION:WNL    BALANCE:decreased tandem stand and single leg stand   GAIT: Patient has decreased gait speed and unsteady gait with out assistive device.   OUTCOME MEASURES: TEST Outcome Interpretation  5 times sit<>stand 17.47sec >80 yo, >15 sec indicates increased risk for falls  10 meter walk test       1.22          m/s <1.0 m/s indicates increased risk for falls; limited community ambulator  Timed up and Go  9.37               sec <14 sec indicates increased risk for falls  6 minute walk test     770        Feet 1000 feet is community ambulator                                     PT Long Term Goals - 2015/07/15 1736    PT LONG TERM GOAL #1   Title Patient will be independent in home exercise program to improve strength/mobility for better functional independence with ADLs2/21/17   Time 12   Period Weeks   Status New   PT LONG TERM GOAL #2   Title Patient (< 58 years old) will complete five times sit to stand test in < 10 seconds indicating an increased LE strength and improved balance2/21/17   Time 12   Period Weeks   Status New   PT LONG TERM GOAL #3   Title Patient will increase six minute walk test distance to >1000 for progression to community ambulator and improve gait ability 09/07/15   Time 12   Period Weeks   Status New               Plan - 07/15/2015 1733    Clinical Impression Statement Patient is 59 yr old female with hx of falls and unsteady gait that is getting worse. She has BLE  weakness and decreased outcome measures for increased falls risk.    Pt will benefit from skilled therapeutic intervention in order to improve on the following deficits Decreased balance;Decreased endurance;Difficulty walking;Obesity;Decreased strength;Pain   Rehab Potential Good   PT Frequency 2x / week   PT Duration 12 weeks   PT Treatment/Interventions Therapeutic activities;Therapeutic exercise;Balance training;Neuromuscular re-education;Gait training   PT Next Visit Plan balance training   PT Home Exercise Plan squats, 4 way hip   Consulted and Agree with Plan of Care Patient          G-Codes - July 15, 2015 1738    Functional Assessment Tool Used 5 x sit to stand, Tug, 10 MW, 6 MW   Functional Limitation Mobility: Walking and moving around   Mobility: Walking and Moving Around Current Status [phone]) At least 20 percent but less than 40 percent impaired, limited or restricted   Mobility: Walking and Moving Around Goal Status [phone]) At least 1 percent but less than 20 percent impaired, limited or restricted       Problem List Patient Active Problem List   Diagnosis Date Noted  . Resting tremor 03/03/2013  . RUQ pain 03/03/2013  . Hx of thyroid cancer 03/03/2013  . Depression with anxiety 03/03/2013    Alanson Puls 06/16/2015, 9:23 AM  [place] MAIN [facility] SERVICES [address] [place], [place], [address] Phone: [phone]   Fax:  [phone]  Name: Meagan Allen MRN: [id number] Date of Birth: [birth year]

## 2015-06-17 ENCOUNTER — Ambulatory Visit: Payer: BLUE CROSS/BLUE SHIELD | Admitting: Physical Therapy

## 2015-06-22 ENCOUNTER — Ambulatory Visit: Payer: BLUE CROSS/BLUE SHIELD | Attending: Neurology | Admitting: Physical Therapy

## 2015-06-22 DIAGNOSIS — R262 Difficulty in walking, not elsewhere classified: Secondary | ICD-10-CM | POA: Insufficient documentation

## 2015-06-23 ENCOUNTER — Encounter: Payer: Self-pay | Admitting: Physical Therapy

## 2015-06-23 NOTE — Therapy (Signed)
[place] MAIN [facility] SERVICES [address] [place], [place], [address] Phone: [phone]   Fax:  [phone]  Physical Therapy Treatment  Patient Details  Name: Meagan Allen MRN: [id number] Date of Birth: [birth year] Referring Provider: Dr. Melrose Nakayama  Encounter Date: 06/22/2015      PT End of Session - 06/23/15 0943    Visit Number 2   Number of Visits 25   PT Start Time 0500   PT Stop Time 0545   PT Time Calculation (min) 45 min   Equipment Utilized During Treatment Gait belt   Activity Tolerance Patient tolerated treatment well   Behavior During Therapy [facility] for tasks assessed/performed      Past Medical History  Diagnosis Date  . Hypertension   . GERD (gastroesophageal reflux disease)   . IBS (irritable bowel syndrome)   . Fibromyalgia   . Anxiety   . Depression   . Rosacea   . 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[place])   . Arthritis   . Diabetes mellitus   . Allergy   . Heart murmur   . Hyperlipidemia   . Migraine   . UTI (lower urinary tract infection)     none recent  . Colon polyps   . Hearing loss of right ear   . Cancer ([place]) 2007, 2009    papillary thyroid CA with mets to cervial lymph nodes  . PONV (postoperative nausea and vomiting)     Past Surgical History  Procedure Laterality Date  . Neck resection  08/2007  . Dilation and curettage of uterus  1984  . Thyroidectomy  2007 and 2009  . Giant cell tumor  2011    resected form right ankle  . Left knee surgery      x 2, torn meniscus  . Joint replacement      bilateral thumb  . Cardiac catheterization  02-09-2012    results in epic, no ekg found  . Colonoscopy with propofol N/A 05/06/2013    Procedure: COLONOSCOPY WITH PROPOFOL;  Surgeon: Garlan Fair, MD;  Location: [facility];  Service: Endoscopy;  Laterality: N/A;    There were no vitals filed for this visit.  Visit Diagnosis:  Difficulty walking      Subjective Assessment - 06/23/15 0938    Subjective Patient is having difficulty with balance. He is not able to walk steady.    Multiple Pain Sites No        Neuromuscular training: Stepping over objects fwd/bwd and on foam with CGA TM walking . 5 miles/hr sidestepping left and right Blue foam balance beam side stepping x 10 in parallel bars Matrix body pull fwd/bwd/side stepping 17 lbs with step ups Patient needs occasional verbal cueing to improve posture and cueing to correctly perform exercises slowly.   Therapeutic exercise; Leg press x 20 x 3 130 lbs, heel raises with 90 lbs x 20 x 3 Heel raises standing  4 way hip RTB x 20                        PT Education - 06/23/15 0938    Education provided Yes   Education Details Review of HEP strengthening and balance exercises   Person(s) Educated Patient   Methods Explanation   Comprehension Verbalized understanding             PT Long Term Goals - 06/15/15 1736    PT LONG TERM GOAL #1   Title Patient will be  independent in home exercise program to improve strength/mobility for better functional independence with ADLs2/21/17   Time 12   Period Weeks   Status New   PT LONG TERM GOAL #2   Title Patient (59 years old) will complete five times sit to stand test in < 10 seconds indicating an increased LE strength and improved balance2/21/17   Time 12   Period Weeks   Status New   PT LONG TERM GOAL #3   Title Patient will increase six minute walk test distance to >1000 for progression to community ambulator and improve gait ability 09/07/15   Time 12   Period Weeks   Status New               Plan - 06/23/15 0944    Clinical Impression Statement Patient performed balance exercises and strengthening exercises without reports of pain.    Pt will benefit from skilled therapeutic intervention in order to improve on the following deficits Decreased balance;Decreased endurance;Difficulty walking;Obesity;Decreased strength;Pain   Rehab  Potential Good   PT Frequency 2x / week   PT Duration 12 weeks   PT Treatment/Interventions Therapeutic activities;Therapeutic exercise;Balance training;Neuromuscular re-education;Gait training   PT Next Visit Plan balance training   PT Home Exercise Plan squats, 4 way hip   Consulted and Agree with Plan of Care Patient        Problem List Patient Active Problem List   Diagnosis Date Noted  . Resting tremor 03/03/2013  . RUQ pain 03/03/2013  . Hx of thyroid cancer 03/03/2013  . Depression with anxiety 03/03/2013    Alanson Puls 06/23/2015, 9:46 AM  [place] MAIN [facility] SERVICES [address] [place], [place], [address] Phone: [phone]   Fax:  [phone]  Name: LYNLEY KILLILEA MRN: [id number] Date of Birth: [birth year]

## 2015-06-24 ENCOUNTER — Encounter: Payer: Self-pay | Admitting: Physical Therapy

## 2015-06-24 ENCOUNTER — Ambulatory Visit: Payer: BLUE CROSS/BLUE SHIELD | Admitting: Physical Therapy

## 2015-06-24 DIAGNOSIS — R262 Difficulty in walking, not elsewhere classified: Secondary | ICD-10-CM | POA: Diagnosis not present

## 2015-06-24 NOTE — Therapy (Signed)
[place] MAIN [facility] SERVICES [address] [place], [place], [address] Phone: [phone]   Fax:  [phone]  Physical Therapy Treatment  Patient Details  Name: MAUDINE KLUESNER MRN: [id number] Date of Birth: [birth year] Referring Provider: Dr. Melrose Nakayama  Encounter Date: 06/24/2015      PT End of Session - 06/24/15 1730    Visit Number 3   Number of Visits 25   PT Start Time 0445   PT Stop Time 0530   PT Time Calculation (min) 45 min   Equipment Utilized During Treatment Gait belt   Activity Tolerance Patient tolerated treatment well   Behavior During Therapy [facility] for tasks assessed/performed      Past Medical History  Diagnosis Date  . Hypertension   . GERD (gastroesophageal reflux disease)   . IBS (irritable bowel syndrome)   . Fibromyalgia   . Anxiety   . Depression   . Rosacea   . 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[place])   . Arthritis   . Diabetes mellitus   . Allergy   . Heart murmur   . Hyperlipidemia   . Migraine   . UTI (lower urinary tract infection)     none recent  . Colon polyps   . Hearing loss of right ear   . Cancer ([place]) 2007, 2009    papillary thyroid CA with mets to cervial lymph nodes  . PONV (postoperative nausea and vomiting)     Past Surgical History  Procedure Laterality Date  . Neck resection  08/2007  . Dilation and curettage of uterus  1984  . Thyroidectomy  2007 and 2009  . Giant cell tumor  2011    resected form right ankle  . Left knee surgery      x 2, torn meniscus  . Joint replacement      bilateral thumb  . Cardiac catheterization  02-09-2012    results in epic, no ekg found  . Colonoscopy with propofol N/A 05/06/2013    Procedure: COLONOSCOPY WITH PROPOFOL;  Surgeon: Garlan Fair, MD;  Location: [facility];  Service: Endoscopy;  Laterality: N/A;    There were no vitals filed for this visit.  Visit Diagnosis:  Difficulty walking      Subjective Assessment - 06/24/15 1730    Subjective Patient is having difficulty with balance. He is not able to walk steady.    Multiple Pain Sites No      Neuromuscular training: Stepping over objects fwd/bwd and on foam with CGA TM walking . 5 miles/hr sidestepping left and right Blue foam balance beam side stepping x 10 in parallel bars Matrix body pull fwd/bwd/side stepping 17 lbs with step ups Patient needs occasional verbal cueing to improve posture and cueing to correctly perform exercises slowly.   Therapeutic exercise; Leg press x 20 x 3 130 lbs, heel raises with 90 lbs x 20 x 3 Heel raises standing  4 way hip RTB x 20 Patient needs occasional verbal cueing to improve posture and cueing to correctly perform exercises slowly, holding at end of range to increase motor firing of desired muscle to encourage fatigue.                            PT Education - 06/24/15 1730    Education provided Yes   Education Details Progression of HEP   Person(s) Educated Patient   Methods Explanation   Comprehension Verbalized understanding  PT Long Term Goals - 06/15/15 1736    PT LONG TERM GOAL #1   Title Patient will be independent in home exercise program to improve strength/mobility for better functional independence with ADLs2/21/17   Time 12   Period Weeks   Status New   PT LONG TERM GOAL #2   Title Patient (59 years old) will complete five times sit to stand test in < 10 seconds indicating an increased LE strength and improved balance2/21/17   Time 12   Period Weeks   Status New   PT LONG TERM GOAL #3   Title Patient will increase six minute walk test distance to >1000 for progression to community ambulator and improve gait ability 09/07/15   Time 12   Period Weeks   Status New               Plan - 06/24/15 1731    Clinical Impression Statement Continues to have balance deficits typical with diagnosis   Pt will benefit from skilled therapeutic intervention in order  to improve on the following deficits Decreased balance;Decreased endurance;Difficulty walking;Obesity;Decreased strength;Pain   Rehab Potential Good   PT Frequency 2x / week   PT Duration 12 weeks   PT Treatment/Interventions Therapeutic activities;Therapeutic exercise;Balance training;Neuromuscular re-education;Gait training   PT Next Visit Plan balance training   PT Home Exercise Plan squats, 4 way hip   Consulted and Agree with Plan of Care Patient        Problem List Patient Active Problem List   Diagnosis Date Noted  . Resting tremor 03/03/2013  . RUQ pain 03/03/2013  . Hx of thyroid cancer 03/03/2013  . Depression with anxiety 03/03/2013    Alanson Puls 06/24/2015, 5:32 PM  [place] MAIN [facility] SERVICES [address] [place], [place], [address] Phone: [phone]   Fax:  [phone]  Name: COLLYN SELK MRN: [id number] Date of Birth: 1956/01/20

## 2015-06-29 ENCOUNTER — Ambulatory Visit: Payer: BLUE CROSS/BLUE SHIELD | Admitting: Physical Therapy

## 2015-06-29 ENCOUNTER — Encounter: Payer: Self-pay | Admitting: Physical Therapy

## 2015-06-29 DIAGNOSIS — R262 Difficulty in walking, not elsewhere classified: Secondary | ICD-10-CM | POA: Diagnosis not present

## 2015-06-29 NOTE — Therapy (Signed)
[place] MAIN [facility] SERVICES [address] [place], [place], [address] Phone: [phone]   Fax:  [phone]  Physical Therapy Treatment  Patient Details  Name: Meagan Allen MRN: [id number] Date of Birth: 09-05-1955 Referring Provider: Dr. Melrose Nakayama  Encounter Date: 06/29/2015      PT End of Session - 06/29/15 1726    Visit Number 4   Number of Visits 25   PT Start Time 0415   PT Stop Time 0500   PT Time Calculation (min) 45 min   Equipment Utilized During Treatment Gait belt   Activity Tolerance Patient tolerated treatment well   Behavior During Therapy [facility] for tasks assessed/performed      Past Medical History  Diagnosis Date  . Hypertension   . GERD (gastroesophageal reflux disease)   . IBS (irritable bowel syndrome)   . Fibromyalgia   . Anxiety   . Depression   . Rosacea   . 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 Arthritis   . Diabetes mellitus   . Allergy   . Heart murmur   . Hyperlipidemia   . Migraine   . UTI (lower urinary tract infection)     none recent  . Colon polyps   . Hearing loss of right ear   . Cancer ([place]) 2007, 2009    papillary thyroid CA with mets to cervial lymph nodes  . PONV (postoperative nausea and vomiting)     Past Surgical History  Procedure Laterality Date  . Neck resection  08/2007  . Dilation and curettage of uterus  1984  . Thyroidectomy  2007 and 2009  . Giant cell tumor  2011    resected form right ankle  . Left knee surgery      x 2, torn meniscus  . Joint replacement      bilateral thumb  . Cardiac catheterization  02-09-2012    results in epic, no ekg found  . Colonoscopy with propofol N/A 05/06/2013    Procedure: COLONOSCOPY WITH PROPOFOL;  Surgeon: Garlan Fair, MD;  [facility];  Service: Endoscopy;  Laterality: N/A;    There were no vitals filed for this visit.  Visit Diagnosis:  Difficulty walking      Subjective Assessment - 06/29/15 1725    Subjective Patient is having difficulty with her feet hurting and feeling stiff.        Standing dynamic balance including side stepping on TM .5  Miles/hr Standing on 1/2 foam and ball sorting, cone reaching Ball toss on blue foam x 5 minutes standing hip abd with YTB x 20  side stepping left and right in parallel bars 10 feet x 3 standing on blue foam with cone reaching x 20 across midline step ups from floor to 6 inch stool x 20 bilateral sit to stand x 10 marching in parallel bars x 20 stepping pattern with weight shifting fwd/bwd x 10.  Min cueing needed to appropriately perform tasks with leg, hand, and head position.  Patient continues to demonstrate some in coordination of movement with select exercises . Patient responds well to verbal and tactile cues to correct form and technique.  CGA to SBA for safety with activities.  Uses to increase intensity  of movements throughout session                          PT Education - 06/29/15 1725    Education provided Yes   Education Details  HEP   Person(s) Educated Patient   Methods Explanation   Comprehension Verbalized understanding             PT Long Term Goals - 06/15/15 1736    PT LONG TERM GOAL #1   Title Patient will be independent in home exercise program to improve strength/mobility for better functional independence with ADLs2/21/17   Time 12   Period Weeks   Status New   PT LONG TERM GOAL #2   Title Patient (< 33 years old) will complete five times sit to stand test in < 10 seconds indicating an increased LE strength and improved balance2/21/17   Time 12   Period Weeks   Status New   PT LONG TERM GOAL #3   Title Patient will increase six minute walk test distance to >1000 for progression to community ambulator and improve gait ability 09/07/15   Time 12   Period Weeks   Status New               Plan - 06/29/15 1726    Clinical Impression Statement Patients performance improves  with practice and she uses UE to help support and for balance   Pt will benefit from skilled therapeutic intervention in order to improve on the following deficits Decreased balance;Decreased endurance;Difficulty walking;Obesity;Decreased strength;Pain   Rehab Potential Good   PT Frequency 2x / week   PT Duration 12 weeks   PT Treatment/Interventions Therapeutic activities;Therapeutic exercise;Balance training;Neuromuscular re-education;Gait training   PT Next Visit Plan balance training   PT Home Exercise Plan squats, 4 way hip   Consulted and Agree with Plan of Care Patient        Problem List Patient Active Problem List   Diagnosis Date Noted  . Resting tremor 03/03/2013  . RUQ pain 03/03/2013  . Hx of thyroid cancer 03/03/2013  . Depression with anxiety 03/03/2013    Alanson Puls 06/29/2015, 5:27 PM  [place] MAIN [facility] SERVICES [address] [place], [place], [address] Phone: [phone]   Fax:  [phone]  Name: Meagan Allen MRN: [id number] Date of Birth: 09-11-1955

## 2015-07-01 ENCOUNTER — Ambulatory Visit: Payer: BLUE CROSS/BLUE SHIELD | Admitting: Physical Therapy

## 2015-07-01 ENCOUNTER — Encounter: Payer: Self-pay | Admitting: Physical Therapy

## 2015-07-01 DIAGNOSIS — R262 Difficulty in walking, not elsewhere classified: Secondary | ICD-10-CM | POA: Diagnosis not present

## 2015-07-01 NOTE — Therapy (Signed)
[place] MAIN [facility] SERVICES [address] [place], [place], [address] Phone: [phone]   Fax:  [phone]  Physical Therapy Treatment  Patient Details  Name: Meagan Allen MRN: [id number] Date of Birth: 1956-04-11 Referring Provider: Dr. Melrose Nakayama  Encounter Date: 07/01/2015      PT End of Session - 07/01/15 1747    Visit Number 5   Number of Visits 25   PT Start Time 0445   PT Stop Time 0530   PT Time Calculation (min) 45 min   Equipment Utilized During Treatment Gait belt   Activity Tolerance Patient tolerated treatment well   Behavior During Therapy [facility] for tasks assessed/performed      Past Medical History  Diagnosis Date  . Hypertension   . GERD (gastroesophageal reflux disease)   . IBS (irritable bowel syndrome)   . Fibromyalgia   . Anxiety   . Depression   . Rosacea   . 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 Arthritis   . Diabetes mellitus   . Allergy   . Heart murmur   . Hyperlipidemia   . Migraine   . UTI (lower urinary tract infection)     none recent  . Colon polyps   . Hearing loss of right ear   . Cancer ([place]) 2007, 2009    papillary thyroid CA with mets to cervial lymph nodes  . PONV (postoperative nausea and vomiting)     Past Surgical History  Procedure Laterality Date  . Neck resection  08/2007  . Dilation and curettage of uterus  1984  . Thyroidectomy  2007 and 2009  . Giant cell tumor  2011    resected form right ankle  . Left knee surgery      x 2, torn meniscus  . Joint replacement      bilateral thumb  . Cardiac catheterization  02-09-2012    results in epic, no ekg found  . Colonoscopy with propofol N/A 05/06/2013    Procedure: COLONOSCOPY WITH PROPOFOL;  Surgeon: Garlan Fair, MD;  [facility];  Service: Endoscopy;  Laterality: N/A;    There were no vitals filed for this visit.  Visit Diagnosis:  Difficulty walking      Subjective Assessment - 07/01/15 1746    Subjective Patient says that she is doing well.       standing hip abd with YTB X 20 with pain complaints in RLE with abduction  side stepping left and right in parallel bars with YTB 10 feet x 3  Matrix fwd/bwd/side stepping x 5 each way Side stepping on TM  .5 miles /hour left and right  Patient needs occasional verbal cueing to improve posture and cueing to correctly perform exercises slowly, holding at end of range to increase motor firing of desired muscle to encourage fatigue.                             PT Education - 07/01/15 1747    Education provided Yes   Education Details HEP   Person(s) Educated Patient   Methods Explanation             PT Long Term Goals - 06/15/15 1736    PT LONG TERM GOAL #1   Title Patient will be independent in home exercise program to improve strength/mobility for better functional independence with ADLs2/21/17   Time 12   Period Weeks   Status New  PT LONG TERM GOAL #2   Title Patient (< 65 years old) will complete five times sit to stand test in < 10 seconds indicating an increased LE strength and improved balance2/21/17   Time 12   Period Weeks   Status New   PT LONG TERM GOAL #3   Title Patient will increase six minute walk test distance to >1000 for progression to community ambulator and improve gait ability 09/07/15   Time 12   Period Weeks   Status New               Plan - 07/01/15 1748    Clinical Impression Statement Patient is able to perform dynamic standing balance activities today with LE challenges.    Pt will benefit from skilled therapeutic intervention in order to improve on the following deficits Decreased balance;Decreased endurance;Difficulty walking;Obesity;Decreased strength;Pain   Rehab Potential Good   PT Frequency 2x / week   PT Duration 12 weeks   PT Treatment/Interventions Therapeutic activities;Therapeutic exercise;Balance training;Neuromuscular re-education;Gait training    PT Next Visit Plan balance training        Problem List Patient Active Problem List   Diagnosis Date Noted  . Resting tremor 03/03/2013  . RUQ pain 03/03/2013  . Hx of thyroid cancer 03/03/2013  . Depression with anxiety 03/03/2013    Alanson Puls 07/01/2015, 5:53 PM  [place] MAIN [facility] SERVICES [address] [place], [place], [address] Phone: [phone]   Fax:  [phone]  Name: Meagan Allen MRN: [id number] Date of Birth: 11/16/1955

## 2015-07-06 ENCOUNTER — Ambulatory Visit: Payer: BLUE CROSS/BLUE SHIELD | Admitting: Physical Therapy

## 2015-07-06 DIAGNOSIS — R262 Difficulty in walking, not elsewhere classified: Secondary | ICD-10-CM

## 2015-07-08 ENCOUNTER — Encounter: Payer: Self-pay | Admitting: Physical Therapy

## 2015-07-08 ENCOUNTER — Ambulatory Visit: Payer: BLUE CROSS/BLUE SHIELD | Admitting: Physical Therapy

## 2015-07-08 DIAGNOSIS — R262 Difficulty in walking, not elsewhere classified: Secondary | ICD-10-CM | POA: Diagnosis not present

## 2015-07-08 NOTE — Therapy (Signed)
[place] MAIN [facility] SERVICES [address] [place], [place], [address] Phone: [phone]   Fax:  [phone]  Physical Therapy Treatment  Patient Details  Name: Meagan Allen MRN: [id number] Date of Birth: 03-May-1956 Referring Provider: Dr. Melrose Nakayama  Encounter Date: 07/08/2015      PT End of Session - 07/08/15 1647    Visit Number 7   Number of Visits 25   PT Start Time 0400   PT Stop Time 0445   PT Time Calculation (min) 45 min   Equipment Utilized During Treatment Gait belt   Activity Tolerance Patient tolerated treatment well   Behavior During Therapy [facility] for tasks assessed/performed      Past Medical History  Diagnosis Date  . Hypertension   . GERD (gastroesophageal reflux disease)   . IBS (irritable bowel syndrome)   . Fibromyalgia   . Anxiety   . Depression   . Rosacea   . 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 Arthritis   . Diabetes mellitus   . Allergy   . Heart murmur   . Hyperlipidemia   . Migraine   . UTI (lower urinary tract infection)     none recent  . Colon polyps   . Hearing loss of right ear   . Cancer ([place]) 2007, 2009    papillary thyroid CA with mets to cervial lymph nodes  . PONV (postoperative nausea and vomiting)     Past Surgical History  Procedure Laterality Date  . Neck resection  08/2007  . Dilation and curettage of uterus  1984  . Thyroidectomy  2007 and 2009  . Giant cell tumor  2011    resected form right ankle  . Left knee surgery      x 2, torn meniscus  . Joint replacement      bilateral thumb  . Cardiac catheterization  02-09-2012    results in epic, no ekg found  . Colonoscopy with propofol N/A 05/06/2013    Procedure: COLONOSCOPY WITH PROPOFOL;  Surgeon: Garlan Fair, MD;  [facility];  Service: Endoscopy;  Laterality: N/A;    There were no vitals filed for this visit.  Visit Diagnosis:  Difficulty walking      Subjective Assessment - 07/08/15 1646    Subjective Patient says that she is doing well.          Neuromuscular training:  TM walking . 5 miles/hr sidestepping left and right Blue foam balance beam side stepping x 10 in parallel bars Matrix body pull fwd/bwd/side stepping 17 lbs with step ups Patient needs occasional verbal cueing to improve posture and cueing to correctly perform exercises slowly.   Therapeutic exercise; Leg press x 20 x 3 130 lbs, heel raises with 90 lbs x 20 x 3 Heel raises standing  4 way hip RTB x 20                          PT Education - 07/08/15 1646    Education provided Yes   Education Details HEP   Person(s) Educated Patient   Methods Explanation   Comprehension Verbalized understanding             PT Long Term Goals - 06/15/15 1736    PT LONG TERM GOAL #1   Title Patient will be independent in home exercise program to improve strength/mobility for better functional independence with ADLs2/21/17   Time 12   Period Weeks  Status New   PT LONG TERM GOAL #2   Title Patient (< 28 years old) will complete five times sit to stand test in < 10 seconds indicating an increased LE strength and improved balance2/21/17   Time 12   Period Weeks   Status New   PT LONG TERM GOAL #3   Title Patient will increase six minute walk test distance to >1000 for progression to community ambulator and improve gait ability 09/07/15   Time 12   Period Weeks   Status New               Plan - 07/08/15 1648    Clinical Impression Statement Patients performance improves with practice and she uses UE to help support and for balance.   Pt will benefit from skilled therapeutic intervention in order to improve on the following deficits Decreased balance;Decreased endurance;Difficulty walking;Obesity;Decreased strength;Pain   Rehab Potential Good   PT Frequency 2x / week   PT Duration 12 weeks   PT Treatment/Interventions Therapeutic activities;Therapeutic exercise;Balance  training;Neuromuscular re-education;Gait training   PT Next Visit Plan balance training        Problem List Patient Active Problem List   Diagnosis Date Noted  . Resting tremor 03/03/2013  . RUQ pain 03/03/2013  . Hx of thyroid cancer 03/03/2013  . Depression with anxiety 03/03/2013    Alanson Puls 07/08/2015, 4:50 PM  [place] MAIN [facility] SERVICES [address] [place], [place], [address] Phone: [phone]   Fax:  [phone]  Name: Meagan Allen MRN: [id number] Date of Birth: [birth year]

## 2015-07-08 NOTE — Therapy (Signed)
[place] MAIN [facility] SERVICES [address] [place], [place], [address] Phone: [phone]   Fax:  [phone]  Physical Therapy Treatment  Patient Details  Name: Meagan Allen MRN: [id number] Date of Birth: 02-02-1956 Referring Provider: Dr. Melrose Nakayama  Encounter Date: 07/06/2015      PT End of Session - 07/08/15 1426    Visit Number 6   Number of Visits 25   PT Start Time 0500   PT Stop Time 0545   PT Time Calculation (min) 45 min   Equipment Utilized During Treatment Gait belt   Activity Tolerance Patient tolerated treatment well   Behavior During Therapy [facility] for tasks assessed/performed      Past Medical History  Diagnosis Date  . Hypertension   . GERD (gastroesophageal reflux disease)   . IBS (irritable bowel syndrome)   . Fibromyalgia   . Anxiety   . Depression   . Rosacea   . 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 Arthritis   . Diabetes mellitus   . Allergy   . Heart murmur   . Hyperlipidemia   . Migraine   . UTI (lower urinary tract infection)     none recent  . Colon polyps   . Hearing loss of right ear   . Cancer ([place]) 2007, 2009    papillary thyroid CA with mets to cervial lymph nodes  . PONV (postoperative nausea and vomiting)     Past Surgical History  Procedure Laterality Date  . Neck resection  08/2007  . Dilation and curettage of uterus  1984  . Thyroidectomy  2007 and 2009  . Giant cell tumor  2011    resected form right ankle  . Left knee surgery      x 2, torn meniscus  . Joint replacement      bilateral thumb  . Cardiac catheterization  02-09-2012    results in epic, no ekg found  . Colonoscopy with propofol N/A 05/06/2013    Procedure: COLONOSCOPY WITH PROPOFOL;  Surgeon: Garlan Fair, MD;  [facility];  Service: Endoscopy;  Laterality: N/A;    There were no vitals filed for this visit.  Visit Diagnosis:  Difficulty walking      Subjective Assessment - 07/08/15 1426    Subjective Patient says that she is doing well.    Currently in Pain? No/denies           neuromuscular training: Stepping over objects fwd/bwd and on foam with CGA TM walking . 5 miles/hr sidestepping left and right Blue foam balance beam side stepping x 10 in parallel bars Matrix body pull fwd/bwd/side stepping 17 lbs with step ups Patient needs occasional verbal cueing to improve posture and cueing to correctly perform exercises slowly.   Therapeutic exercise; Leg press x 20 x 3 130 lbs, heel raises with 90 lbs x 20 x 3 Heel raises standing  4 way hip RTB x 20 Min cueing needed to appropriately perform tasks with leg, hand, and head position. Decreased coordination demonstrated requiring consistent verbal cueing to correct form. Patient responds well to verbal and tactile cues to correct form and technique.  CGA to SBA for safety with activities.  Uses to increase intensity  of movements throughout session                       PT Education - 07/08/15 1426    Education provided Yes   Person(s) Educated Patient   Methods  Explanation   Comprehension Verbalized understanding             PT Long Term Goals - 06/15/15 1736    PT LONG TERM GOAL #1   Title Patient will be independent in home exercise program to improve strength/mobility for better functional independence with ADLs2/21/17   Time 12   Period Weeks   Status New   PT LONG TERM GOAL #2   Title Patient (< 35 years old) will complete five times sit to stand test in < 10 seconds indicating an increased LE strength and improved balance2/21/17   Time 12   Period Weeks   Status New   PT LONG TERM GOAL #3   Title Patient will increase six minute walk test distance to >1000 for progression to community ambulator and improve gait ability 09/07/15   Time 12   Period Weeks   Status New               Plan - 07/08/15 1427    Clinical Impression Statement Patient has unsteady stepping with  several exercises but his motor control improve with practice   Pt will benefit from skilled therapeutic intervention in order to improve on the following deficits Decreased balance;Decreased endurance;Difficulty walking;Obesity;Decreased strength;Pain   Rehab Potential Good   PT Frequency 2x / week   PT Duration 12 weeks   PT Treatment/Interventions Therapeutic activities;Therapeutic exercise;Balance training;Neuromuscular re-education;Gait training   PT Next Visit Plan balance training        Problem List Patient Active Problem List   Diagnosis Date Noted  . Resting tremor 03/03/2013  . RUQ pain 03/03/2013  . Hx of thyroid cancer 03/03/2013  . Depression with anxiety 03/03/2013    Alanson Puls 07/08/2015, 2:30 PM  [place] MAIN [facility] SERVICES [address] [place], [place], [address] Phone: [phone]   Fax:  [phone]  Name: Meagan Allen MRN: [id number] Date of Birth: 19-Feb-1956

## 2015-07-13 ENCOUNTER — Encounter: Payer: BLUE CROSS/BLUE SHIELD | Admitting: Physical Therapy

## 2015-07-15 ENCOUNTER — Ambulatory Visit: Payer: BLUE CROSS/BLUE SHIELD

## 2015-07-15 ENCOUNTER — Encounter: Payer: Self-pay | Admitting: Physical Therapy

## 2015-07-15 VITALS — BP 145/88 | HR 99

## 2015-07-15 DIAGNOSIS — R262 Difficulty in walking, not elsewhere classified: Secondary | ICD-10-CM

## 2015-07-15 NOTE — Therapy (Signed)
[place] MAIN [facility] SERVICES [address] [place], [place], [address] Phone: [phone]   Fax:  [phone]  Physical Therapy Treatment  Patient Details  Name: Meagan Allen MRN: [id number] Date of Birth: 1956-04-30 Referring Provider: Dr. Melrose Nakayama  Encounter Date: 07/15/2015      PT End of Session - 07/15/15 1040    Visit Number 8   Number of Visits 25   PT Start Time 1000   PT Stop Time 1045   PT Time Calculation (min) 45 min   Equipment Utilized During Treatment Gait belt   Activity Tolerance Patient tolerated treatment well   Behavior During Therapy [facility] for tasks assessed/performed      Past Medical History  Diagnosis Date  . Hypertension   . GERD (gastroesophageal reflux disease)   . IBS (irritable bowel syndrome)   . Fibromyalgia   . Anxiety   . Depression   . Rosacea   . 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[place])   . Arthritis   . Diabetes mellitus   . Allergy   . Heart murmur   . Hyperlipidemia   . Migraine   . UTI (lower urinary tract infection)     none recent  . Colon polyps   . Hearing loss of right ear   . Cancer ([place]) 2007, 2009    papillary thyroid CA with mets to cervial lymph nodes  . PONV (postoperative nausea and vomiting)     Past Surgical History  Procedure Laterality Date  . Neck resection  08/2007  . Dilation and curettage of uterus  1984  . Thyroidectomy  2007 and 2009  . Giant cell tumor  2011    resected form right ankle  . Left knee surgery      x 2, torn meniscus  . Joint replacement      bilateral thumb  . Cardiac catheterization  02-09-2012    results in epic, no ekg found  . Colonoscopy with propofol N/A 05/06/2013    Procedure: COLONOSCOPY WITH PROPOFOL;  Surgeon: Garlan Fair, MD;  Location: [facility];  Service: Endoscopy;  Laterality: N/A;    Filed Vitals:   07/15/15 1002  BP: 145/88  Pulse: 99  SpO2: 100%    Visit Diagnosis:  Difficulty walking      Subjective  Assessment - 07/15/15 0959    Subjective Patient says that she is doing well today. She had a "bad balance day" yesterday but is unable to attribute a specific cause. No pain reported today.    Currently in Pain? No/denies       Neuromuscular training:  VOR positive for dizziness (1/10), no blurring reported; Positive VOR thrust to the R, negative to the L 20/20 static visual acuity, dynamic visual acuity: 20/50  Clinical Test of Sensory Interaction for Balance    (CTSIB):  CONDITION TIME STRATEGY SWAY  Eyes open, firm surface >30  1+  Eyes closed, firm surface >30  2+  Eyes open, foam surface >30  1+  Eyes closed, foam surface 9.3 seconds  3+    Airex NBOS with horizontal/vertical head turns x 30 seconds each; Airex NBOS horizontal head with body turns x 30 seconds; Airex NBOS ball passes from one hand to the next with head/eye follow x 30 seconds; Ambulation in hallway with horizontal and vertical head turns; Ambulation in hallway with ball passes from one hand to the next VOR x 1 horizontal 60 seconds x 3 (added to HEP);  PT Education - 07/15/15 1000    Education provided Yes   Education Details HEP reinforced, issued VOR x 1 60 seconds to HEP   Person(s) Educated Patient   Methods Explanation   Comprehension Verbalized understanding             PT Long Term Goals - 06/15/15 1736    PT LONG TERM GOAL #1   Title Patient will be independent in home exercise program to improve strength/mobility for better functional independence with ADLs2/21/17   Time 12   Period Weeks   Status New   PT LONG TERM GOAL #2   Title Patient (< 60 years old) will complete five times sit to stand test in < 10 seconds indicating an increased LE strength and improved balance2/21/17   Time 12   Period Weeks   Status New   PT LONG TERM GOAL #3   Title Patient will increase six minute walk test distance to >1000 for progression to community  ambulator and improve gait ability 09/07/15   Time 12   Period Weeks   Status New               Plan - 07/15/15 1040    Clinical Impression Statement Pt demonstrates 4 line loss from static to dynamic visual acuity. Positive R VOR head thrust and decreased balance in condition 4 of mCTSIB. Pt reports a history of R hearing loss from viral infection of R ear when she was 59 years-old which resulted in vertigo. She currently denies dizziness or vertigo but complains of worsening balance with head turns. Pt would benefit from consideration for vestibular PT. Pt provided VOR x 1 horizontal to add to HEP. Follow-up as scheduled.    Pt will benefit from skilled therapeutic intervention in order to improve on the following deficits Decreased balance;Decreased endurance;Difficulty walking;Obesity;Decreased strength;Pain   Rehab Potential Good   PT Frequency 2x / week   PT Duration 12 weeks   PT Treatment/Interventions Therapeutic activities;Therapeutic exercise;Balance training;Neuromuscular re-education;Gait training   PT Next Visit Plan balance training, continue activities incorporating head turns, review VOR x 1 horizontal    PT Home Exercise Plan squats, 4 way hip, modified tandem with head turns, VOR x 1 horizontal         Problem List Patient Active Problem List   Diagnosis Date Noted  . Resting tremor 03/03/2013  . RUQ pain 03/03/2013  . Hx of thyroid cancer 03/03/2013  . Depression with anxiety 03/03/2013   Phillips Grout PT, DPT   Hymie Gorr 07/15/2015, 11:38 AM  [place] MAIN [facility] SERVICES [address] [place], [place], [address] Phone: [phone]   Fax:  [phone]  Name: Meagan Allen MRN: [id number] Date of Birth: 1955/12/29

## 2015-07-15 NOTE — Patient Instructions (Addendum)
  Gaze Stabilization: Sitting    Keeping eyes on target on wall about an arm's length away, tilt head down 15-30 and move head side to side for _60__ seconds.Rest and repeat 2 more times. Perform 3-4 times per day. Dizziness should resolve within a few minutes after finishing exercise. If it lasts longer than 20 minutes slow down the next time.

## 2015-07-20 ENCOUNTER — Encounter: Payer: Self-pay | Admitting: Physical Therapy

## 2015-07-20 ENCOUNTER — Ambulatory Visit: Payer: BLUE CROSS/BLUE SHIELD | Attending: Neurology | Admitting: Physical Therapy

## 2015-07-20 DIAGNOSIS — R262 Difficulty in walking, not elsewhere classified: Secondary | ICD-10-CM

## 2015-07-20 NOTE — Therapy (Signed)
[place] MAIN [facility] SERVICES [address] [place], [place], [address] Phone: [phone]   Fax:  [phone]  Physical Therapy Treatment  Patient Details  Name: Meagan Allen MRN: [id number] Date of Birth: December 22, 1955 Referring Provider: Dr. Melrose Nakayama  Encounter Date: 07/20/2015      PT End of Session - 07/20/15 1806    Visit Number 9   Number of Visits 25   PT Start Time 0515   PT Stop Time 0600   PT Time Calculation (min) 45 min   Equipment Utilized During Treatment Gait belt   Activity Tolerance Patient tolerated treatment well   Behavior During Therapy [facility] for tasks assessed/performed      Past Medical History  Diagnosis Date  . Hypertension   . GERD (gastroesophageal reflux disease)   . IBS (irritable bowel syndrome)   . Fibromyalgia   . Anxiety   . Depression   . Rosacea   . 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 Arthritis   . Diabetes mellitus   . Allergy   . Heart murmur   . Hyperlipidemia   . Migraine   . UTI (lower urinary tract infection)     none recent  . Colon polyps   . Hearing loss of right ear   . Cancer ([place]) 2007, 2009    papillary thyroid CA with mets to cervial lymph nodes  . PONV (postoperative nausea and vomiting)     Past Surgical History  Procedure Laterality Date  . Neck resection  08/2007  . Dilation and curettage of uterus  1984  . Thyroidectomy  2007 and 2009  . Giant cell tumor  2011    resected form right ankle  . Left knee surgery      x 2, torn meniscus  . Joint replacement      bilateral thumb  . Cardiac catheterization  02-09-2012    results in epic, no ekg found  . Colonoscopy with propofol N/A 05/06/2013    Procedure: COLONOSCOPY WITH PROPOFOL;  Surgeon: Garlan Fair, MD;  Location: [facility];  Service: Endoscopy;  Laterality: N/A;    There were no vitals filed for this visit.  Visit Diagnosis:  Difficulty walking      Subjective Assessment - 07/20/15 1806    Subjective Patient says that she is doing well today. She had a "bad balance day" yesterday but is unable to attribute a specific cause. No pain reported today.    Currently in Pain? No/denies             Neuromuscular training: Stepping over objects fwd/bwd and on foam with CGA TM walking . 5 miles/hr sidestepping left and right Blue foam balance beam side stepping x 10 in parallel bars Matrix body pull fwd/bwd/side stepping 17 lbs with step ups Patient needs occasional verbal cueing to improve posture and cueing to correctly perform exercises slowly.  Therapeutic exercise; Leg press x 20 x 3 130 lbs, heel raises with 90 lbs x 20 x 3 Heel raises standing  4 way hip RTB x 20 Patient needs occasional verbal cueing to improve posture and cueing to correctly perform exercises slowly, holding at end of range to increase motor firing of desired muscle to encourage fatigue.                                 PT Education - 07/20/15 1806    Education provided Yes  Education Details HEP   Person(s) Educated Patient   Methods Explanation   Comprehension Verbalized understanding             PT Long Term Goals - 06/15/15 1736    PT LONG TERM GOAL #1   Title Patient will be independent in home exercise program to improve strength/mobility for better functional independence with ADLs2/21/17   Time 12   Period Weeks   Status New   PT LONG TERM GOAL #2   Title Patient (< 19 years old) will complete five times sit to stand test in < 10 seconds indicating an increased LE strength and improved balance2/21/17   Time 12   Period Weeks   Status New   PT LONG TERM GOAL #3   Title Patient will increase six minute walk test distance to >1000 for progression to community ambulator and improve gait ability 09/07/15   Time 12   Period Weeks   Status New               Problem List Patient Active Problem List   Diagnosis Date Noted  . Resting tremor  03/03/2013  . RUQ pain 03/03/2013  . Hx of thyroid cancer 03/03/2013  . Depression with anxiety 03/03/2013    Alanson Puls 07/20/2015, 6:08 PM  [place] MAIN [facility] SERVICES [address] [place], [place], [address] Phone: [phone]   Fax:  [phone]  Name: WILLIS KUIPERS MRN: [id number] Date of Birth: 27-May-1956

## 2015-07-22 ENCOUNTER — Encounter: Payer: BLUE CROSS/BLUE SHIELD | Admitting: Physical Therapy

## 2015-07-27 ENCOUNTER — Ambulatory Visit: Payer: BLUE CROSS/BLUE SHIELD | Admitting: Physical Therapy

## 2015-07-29 ENCOUNTER — Ambulatory Visit: Payer: BLUE CROSS/BLUE SHIELD | Admitting: Physical Therapy

## 2016-03-03 ENCOUNTER — Other Ambulatory Visit: Payer: Self-pay | Admitting: Neurology

## 2016-03-03 DIAGNOSIS — D329 Benign neoplasm of meninges, unspecified: Secondary | ICD-10-CM

## 2016-03-12 ENCOUNTER — Ambulatory Visit
Admission: RE | Admit: 2016-03-12 | Discharge: 2016-03-12 | Disposition: A | Discharge status: Home / Self Care | Payer: BLUE CROSS/BLUE SHIELD | Source: Ambulatory Visit | Attending: Neurology | Admitting: Neurology

## 2016-03-12 DIAGNOSIS — D329 Benign neoplasm of meninges, unspecified: Secondary | ICD-10-CM

## 2016-03-12 MED ORDER — GADOBENATE DIMEGLUMINE 529 MG/ML IV SOLN
20.0000 mL | Freq: Once | INTRAVENOUS | Status: AC | PRN
  Administered 2016-03-12: 20 mL via INTRAVENOUS

## 2016-05-15 ENCOUNTER — Other Ambulatory Visit: Payer: Self-pay | Admitting: Unknown Physician Specialty

## 2016-05-15 DIAGNOSIS — C73 Malignant neoplasm of thyroid gland: Secondary | ICD-10-CM

## 2016-05-15 DIAGNOSIS — M5020 Other cervical disc displacement, unspecified cervical region: Secondary | ICD-10-CM

## 2016-05-17 ENCOUNTER — Other Ambulatory Visit: Payer: Self-pay | Admitting: Gastroenterology

## 2016-05-17 DIAGNOSIS — Z1231 Encounter for screening mammogram for malignant neoplasm of breast: Secondary | ICD-10-CM

## 2016-05-26 ENCOUNTER — Ambulatory Visit
Admission: RE | Admit: 2016-05-26 | Discharge: 2016-05-26 | Disposition: A | Discharge status: Home / Self Care | Payer: BLUE CROSS/BLUE SHIELD | Source: Ambulatory Visit | Attending: Unknown Physician Specialty | Admitting: Unknown Physician Specialty

## 2016-05-26 DIAGNOSIS — M5020 Other cervical disc displacement, unspecified cervical region: Secondary | ICD-10-CM

## 2016-05-26 DIAGNOSIS — M47892 Other spondylosis, cervical region: Secondary | ICD-10-CM | POA: Diagnosis not present

## 2016-05-26 DIAGNOSIS — C73 Malignant neoplasm of thyroid gland: Secondary | ICD-10-CM | POA: Diagnosis not present

## 2016-05-26 MED ORDER — IOPAMIDOL (ISOVUE-300) INJECTION 61%
75.0000 mL | Freq: Once | INTRAVENOUS | Status: AC | PRN
  Administered 2016-05-26: 75 mL via INTRAVENOUS

## 2016-06-12 ENCOUNTER — Encounter: Payer: Self-pay | Admitting: Dietician

## 2016-06-12 ENCOUNTER — Encounter: Payer: BLUE CROSS/BLUE SHIELD | Attending: Internal Medicine | Admitting: Dietician

## 2016-06-12 DIAGNOSIS — Z713 Dietary counseling and surveillance: Secondary | ICD-10-CM | POA: Insufficient documentation

## 2016-06-12 DIAGNOSIS — E790 Hyperuricemia without signs of inflammatory arthritis and tophaceous disease: Secondary | ICD-10-CM

## 2016-06-12 DIAGNOSIS — E119 Type 2 diabetes mellitus without complications: Secondary | ICD-10-CM

## 2016-06-12 NOTE — Progress Notes (Signed)
Medical Nutrition Therapy:  Appt start time: 0815 end time:  0920.   Assessment:  Primary concerns today: Patient is here alone.  She has been referred for type 2 diabetes and increased uric acid level.   She states that earlier this year at the advice of her chiropractor, she started "clean eating".  She was off all dairy except for feta cheese, grain, sugar, and coffee.  She added coffee and grain back in small amounts and has consistently gone back to her old habits.  She lost 25 lbs in six weeks and overall has maintained but her A1C went up from 6.2% to 7.1% 04/28/16.  She was also able to decrease her insulin from 180 units per day to 60 units per day.  Severe constipation that she has had since childhood also improved.  She went from 1 bowel movement every 7-10 days to a bowel movement several times per day.  She states that she felt better and wants to get back to healthier eating.  She does not tolerate dairy (lactose intolerance), beans and gassy vegetables, tomatoes, peppers and coffee which cause indigestion.  Her hx includes IBS, hyperlipidemia, HTN, GERD, depression and anxiety, thyroid cancer (resolved), and fibromyalgia.  Other labs include cholesterol 207, triglycerides 291, HDL 42, LDL 107.  C-Reative Protein 6.68.  She would like to know what to eat to decrease inflammation.  Patient lives with her husband.  She does the shopping and cooking for herself as her husband eats mostly hotdogs and frozen pizza despite CHF.  She works at [employer].  Preferred Learning Style:   No preference indicated 70 units of Lantus q HS, Janumet, Potassium, and vitamin D.  She does not take Novolog secondary to frequent low blood sugar after its use.  Learning Readiness:   Ready  Change in progress   MEDICATIONS: see list to include:     DIETARY INTAKE: She has started to get a produce box to try out new vegetables and increase variety.  She has never tasted a sweet  potato. Usual eating pattern includes 3 meals and 2-3 snacks per day. Avoided foods include tomatoes, peppers, most dairy, beans and gassy vegetables.  She states that she is going to stop coffee due to stomach upset.    24-hr recall:  B ( AM): [place] links and "smoothie" made with pea protein and other ingredients along with juice or almond milk  Snk (9AM): herb tea with 1/2 packet stevia and fruit L (12 PM): almond milk yogurt with Ancient grains granola OR brunswick stew OR [place] and pickles OR humus with nut thin flax seed cracker Snk ( PM): fruit D ( PM): vegetables, sauerkraut, lamb, grass fed beef, salmon, free range chicken but recently rotisserie chicken with or without a baked potato or vegetable Snk ( PM): ice cream "bored" Beverages: herb tea, water, juice, almond milk, coffee with stevia and half and half  Usual physical activity: none (knee pain and foot pain).  Loves to walk but currently unable to do this.  Used to do water aerobics but this is not convenient.  She has a recumbent bike but does not use this.  Estimated energy needs: 1600 calories 180 g carbohydrates 120 g protein 44 g fat  Progress Towards Goal(s):  In progress.   Nutritional Diagnosis:  NB-1.1 Food and nutrition-related knowledge deficit As related to balance of carbohydrate, protein, and fat.  As evidenced by diet hx and patient report.    Intervention:  Nutrition counseling/education related  to healthy eating to reduce inflammation and uric acid.  Discussed importance of decreased meat intake and increased intake of vegetables.  Discussed her previous eating habits, improved blood sugar, weight and that she felt better vs when she changed her eating habits and did not feel as well.  Discussed mindful eating and finding other things that she enjoys when she is bored.  Discussed meal prepping, cooking, and shopping.  Increase your vegetable intake. Limit meat portions to the size of a deck of  cards. Almond milk rather than juice in your morning smoothie most often. Consider making a pot of soup every week and eat more salad.  Teaching Method Utilized:  Visual Auditory Hands on  Handouts given during visit include:  Low Purine Nutrition Therapy from AND  Barriers to learning/adherence to lifestyle change: none  Demonstrated degree of understanding via:  Teach Back   Monitoring/Evaluation:  Dietary intake, exercise, and body weight prn.

## 2016-06-12 NOTE — Patient Instructions (Signed)
Increase your vegetable intake. Limit meat portions to the size of a deck of cards. Almond milk rather than juice in your morning smoothie most often. Consider making a pot of soup every week and eat more salad.

## 2016-06-13 ENCOUNTER — Ambulatory Visit
Admission: RE | Admit: 2016-06-13 | Discharge: 2016-06-13 | Disposition: A | Discharge status: Home / Self Care | Payer: BLUE CROSS/BLUE SHIELD | Source: Ambulatory Visit | Attending: Gastroenterology | Admitting: Gastroenterology

## 2016-06-13 DIAGNOSIS — Z1231 Encounter for screening mammogram for malignant neoplasm of breast: Secondary | ICD-10-CM

## 2016-06-14 ENCOUNTER — Other Ambulatory Visit: Payer: Self-pay | Admitting: Gastroenterology

## 2016-06-14 DIAGNOSIS — R928 Other abnormal and inconclusive findings on diagnostic imaging of breast: Secondary | ICD-10-CM

## 2016-06-16 ENCOUNTER — Ambulatory Visit
Admission: RE | Admit: 2016-06-16 | Discharge: 2016-06-16 | Disposition: A | Discharge status: Home / Self Care | Payer: BLUE CROSS/BLUE SHIELD | Source: Ambulatory Visit | Attending: Gastroenterology | Admitting: Gastroenterology

## 2016-06-16 DIAGNOSIS — R928 Other abnormal and inconclusive findings on diagnostic imaging of breast: Secondary | ICD-10-CM

## 2016-09-18 ENCOUNTER — Other Ambulatory Visit: Payer: Self-pay | Admitting: Nurse Practitioner

## 2016-09-18 DIAGNOSIS — R1084 Generalized abdominal pain: Secondary | ICD-10-CM

## 2016-09-26 ENCOUNTER — Other Ambulatory Visit: Payer: BLUE CROSS/BLUE SHIELD

## 2016-09-29 ENCOUNTER — Ambulatory Visit
Admission: RE | Admit: 2016-09-29 | Discharge: 2016-09-29 | Disposition: A | Discharge status: Home / Self Care | Payer: BLUE CROSS/BLUE SHIELD | Source: Ambulatory Visit | Attending: Nurse Practitioner | Admitting: Nurse Practitioner

## 2016-09-29 DIAGNOSIS — R1084 Generalized abdominal pain: Secondary | ICD-10-CM

## 2016-11-13 ENCOUNTER — Ambulatory Visit: Payer: Self-pay | Admitting: Podiatry

## 2016-11-20 ENCOUNTER — Encounter: Payer: Self-pay | Admitting: Podiatry

## 2016-11-20 ENCOUNTER — Ambulatory Visit (INDEPENDENT_AMBULATORY_CARE_PROVIDER_SITE_OTHER): Payer: BLUE CROSS/BLUE SHIELD | Admitting: Podiatry

## 2016-11-20 ENCOUNTER — Ambulatory Visit: Payer: BLUE CROSS/BLUE SHIELD

## 2016-11-20 DIAGNOSIS — D3610 Benign neoplasm of peripheral nerves and autonomic nervous system, unspecified: Secondary | ICD-10-CM | POA: Diagnosis not present

## 2016-11-20 DIAGNOSIS — R52 Pain, unspecified: Secondary | ICD-10-CM

## 2016-11-23 NOTE — Progress Notes (Signed)
Subjective:    Patient ID: Meagan Allen, female   DOB: 61 y.o.   MRN: [id number]   HPI 61 year old female presents the also concerns of a neuroma on the right third interspace. She states that she hit his numbness and tingling to the third and fourth toes as well as pain and the area. She does have history of a neuroma on the left foot. She denies any recent injury or trauma. No swelling or redness. No recent treatment. She has no other complaints today.   Review of Systems  All other systems reviewed and are negative.       Objective:  Physical Exam General: AAO x3, NAD  Dermatological: Skin is warm, dry and supple bilateral. Nails x 10 are well manicured; remaining integument appears unremarkable at this time. There are no open sores, no preulcerative lesions, no rash or signs of infection present.  Vascular: Dorsalis Pedis artery and Posterior Tibial artery pedal pulses are 2/4 bilateral with immedate capillary fill time.  There is no pain with calf compression, swelling, warmth, erythema.   Neruologic: Grossly intact via light touch bilateral. Vibratory intact via tuning fork bilateral. Protective threshold with Semmes Wienstein monofilament intact to all pedal sites bilateral.   Musculoskeletal: Tenderness is present in the third interspace on the right foot. Small clicking sensation is present. Is no area pinpoint bony tenderness or pain the vibratory sensation. There is no overlying edema, erythema, increase in warmth. Muscular strength 5/5 in all groups tested bilateral.  Gait: Unassisted, Nonantalgic.      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    Plan:     -Treatment options discussed including all alternatives, risks, and complications -Etiology of symptoms were discussed -X-rays were obtained and reviewed with the patient. No evidence of acute fracture identified. -Steroid injection was performed state of the right third interspace without  complications. A mixture Kenalog and local anesthetic was infiltrated without complications. Post injection care was discussed. -Offloading pads were dispensed. -Follow-up as scheduled or sooner if needed.  Celesta Gentile, DPM

## 2017-06-21 ENCOUNTER — Other Ambulatory Visit: Payer: Self-pay | Admitting: Nurse Practitioner

## 2017-06-21 DIAGNOSIS — Z1231 Encounter for screening mammogram for malignant neoplasm of breast: Secondary | ICD-10-CM

## 2017-07-30 ENCOUNTER — Ambulatory Visit: Payer: BLUE CROSS/BLUE SHIELD

## 2017-08-06 DIAGNOSIS — M75120 Complete rotator cuff tear or rupture of unspecified shoulder, not specified as traumatic: Secondary | ICD-10-CM | POA: Insufficient documentation

## 2017-08-10 DIAGNOSIS — K219 Gastro-esophageal reflux disease without esophagitis: Secondary | ICD-10-CM

## 2017-08-10 DIAGNOSIS — E782 Mixed hyperlipidemia: Secondary | ICD-10-CM | POA: Insufficient documentation

## 2017-08-10 DIAGNOSIS — E1142 Type 2 diabetes mellitus with diabetic polyneuropathy: Secondary | ICD-10-CM

## 2017-08-10 DIAGNOSIS — R002 Palpitations: Secondary | ICD-10-CM

## 2017-08-10 DIAGNOSIS — E890 Postprocedural hypothyroidism: Secondary | ICD-10-CM | POA: Insufficient documentation

## 2017-08-10 DIAGNOSIS — K760 Fatty (change of) liver, not elsewhere classified: Secondary | ICD-10-CM

## 2017-08-10 DIAGNOSIS — E7800 Pure hypercholesterolemia, unspecified: Secondary | ICD-10-CM

## 2017-08-14 ENCOUNTER — Encounter: Payer: Self-pay | Admitting: Cardiology

## 2017-08-14 ENCOUNTER — Ambulatory Visit (INDEPENDENT_AMBULATORY_CARE_PROVIDER_SITE_OTHER): Payer: BLUE CROSS/BLUE SHIELD | Admitting: Cardiology

## 2017-08-14 VITALS — BP 120/80 | HR 95 | Ht 67.0 in | Wt 226.0 lb

## 2017-08-14 DIAGNOSIS — I10 Essential (primary) hypertension: Secondary | ICD-10-CM | POA: Diagnosis not present

## 2017-08-14 DIAGNOSIS — Z85850 Personal history of malignant neoplasm of thyroid: Secondary | ICD-10-CM

## 2017-08-14 DIAGNOSIS — R002 Palpitations: Secondary | ICD-10-CM

## 2017-08-14 DIAGNOSIS — E1142 Type 2 diabetes mellitus with diabetic polyneuropathy: Secondary | ICD-10-CM

## 2017-08-14 DIAGNOSIS — E7800 Pure hypercholesterolemia, unspecified: Secondary | ICD-10-CM

## 2017-08-14 NOTE — Progress Notes (Signed)
Cardiology Consultation:    Date:  08/14/2017   ID:  Meagan Allen, DOB 03-10-1956, MRN [id number]  PCP:  Dianna Rossetti, NP  Cardiologist:  Jenne Campus, MD   Referring MD: Garlan Fair, MD   Chief Complaint  Patient presents with  . Pre-op Exam  I need my shoulder surgery  History of Present Illness:    Meagan Allen is a 62 y.o. female who is being seen today for the evaluation of multiple risk factors for coronary artery disease at the request of Garlan Fair, MD.  She was referred to [place] for evaluation before elective left shoulder surgery.  She does have unsteady balance she does have multiple falls she fell down one time and injured her left shoulder she does have rotary cuff tear that need to be surgically fixed.  She does have multiple risk factors for coronary artery disease.  She does have diabetes mellitus for many years which is fairly well controlled.  Also does have history of dyslipidemia with difficulty tolerating medications as well as essential hypertension.  About 6 years ago she had a stress test done which was abnormal after that she ended up having cardiac catheterization which was perfectly normal.  However since that time she got the progressive worsening of her diabetes and dyslipidemia that she is not able to tolerate medication for it Overall she is doing fair she said walking make her tired and exhausted she also described the fact that she is getting unexplained sweating while walking.  Denies having any chest pain tightness squeezing pressure burning chest but is very concerned about the sweating.  She does have a very unsteady gait and multiple falls there is no passing out.  When she is falling down she knows that she is losing balance and she is going down.  Never completely passed out. She does have history of thyroid cancer.  Past Medical History:  Diagnosis Date  . Allergy   . Anxiety   . Arthritis   . Cancer ([place]) 2007, 2009   papillary thyroid CA with mets to cervial lymph nodes  . Colon polyps   . Depression   . Diabetes mellitus   . Fibromyalgia   . GERD (gastroesophageal reflux disease)   . Hearing loss of right ear   . Heart murmur   . Hyperlipidemia   . Hypertension   . IBS (irritable bowel syndrome)   . Migraine   . PONV (postoperative nausea and vomiting)   . Rosacea   . 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 UTI (lower urinary tract infection)    none recent    Past Surgical History:  Procedure Laterality Date  . CARDIAC CATHETERIZATION  02-09-2012   results in epic, no ekg found  . COLONOSCOPY WITH PROPOFOL N/A 05/06/2013   Procedure: COLONOSCOPY WITH PROPOFOL;  Surgeon: Garlan Fair, MD;  Location: [facility];  Service: Endoscopy;  Laterality: N/A;  . DILATION AND CURETTAGE OF UTERUS  1984  . giant cell tumor  2011   resected form right ankle  . JOINT REPLACEMENT     bilateral thumb  . left knee surgery     x 2, torn meniscus  . neck resection  08/2007  . THYROIDECTOMY  2007 and 2009    Current Medications: Current Meds  Medication Sig  . buPROPion (WELLBUTRIN XL) 150 MG 24 hr tablet Take 450 mg by mouth every morning.   . cetirizine (ZYRTEC) 10 MG tablet Take 10 mg by mouth  daily.  . chlorthalidone (HYGROTON) 25 MG tablet Take 25 mg by mouth every morning.   . Cholecalciferol (VITAMIN D) 2000 UNITS tablet Take 2,000 Units by mouth daily.  . clonazePAM (KLONOPIN) 0.5 MG tablet Take 0.5 mg by mouth 2 (two) times daily as needed (sleep).   . Insulin Glargine (LANTUS SOLOSTAR) 100 UNIT/ML Solostar Pen Inject 70 Units into the skin daily at 10 pm.  . levothyroxine (SYNTHROID, LEVOTHROID) 150 MCG tablet Take 150 mcg by mouth daily before breakfast.  . liraglutide (VICTOZA) 18 MG/3ML SOPN Inject into the skin.  Marland Kitchen losartan (COZAAR) 25 MG tablet Take 25 mg by mouth daily.  . mesalamine (LIALDA) 1.2 g EC tablet Take 2.4 g by mouth daily with breakfast.   . metFORMIN (GLUCOPHAGE-XR)  500 MG 24 hr tablet Take 1,000 mg by mouth every evening.  . methocarbamol (ROBAXIN) 500 MG tablet Take 500 mg by mouth 3 (three) times daily.   . metoprolol (LOPRESSOR) 100 MG tablet Take 100 mg by mouth daily.   . Misc Natural Products (COSAMIN ASU ADVANCED FORMULA) CAPS Take 1 capsule by mouth daily.  . pantoprazole (PROTONIX) 40 MG tablet Take 40 mg by mouth daily.  . polyethylene glycol powder (GLYCOLAX/MIRALAX) powder Take 17 g by mouth daily.   . predniSONE (DELTASONE) 10 MG tablet Take 10 mg by mouth daily with breakfast.  . SitaGLIPtin-MetFORMIN HCl (JANUMET XR) [phone] MG TB24 Take 1 tablet by mouth daily.     Allergies:   Sulfa antibiotics; Bydureon [exenatide]; Invokana [canagliflozin]; Jardiance [empagliflozin]; Lisinopril; and Pravastatin   Social History   Socioeconomic History  . Marital status: Married    Spouse name: Jeneen Rinks  . Number of children: 2  . Years of education: 56  . Highest education level: None  Social Needs  . Financial resource strain: None  . Food insecurity - worry: None  . Food insecurity - inability: None  . Transportation needs - medical: None  . Transportation needs - non-medical: None  Occupational History  . Occupation: Banker for [employer]: [employer]  Tobacco Use  . Smoking status: Former Smoker    Packs/day: 3.00    Years: 10.00    Pack years: 30.00    Types: Cigarettes    Last attempt to quit: 07/17/1980    Years since quitting: 37.1  . Smokeless tobacco: Never Used  Substance and Sexual Activity  . Alcohol use: No    Comment: Rare use  . Drug use: No  . Sexual activity: Yes    Birth control/protection: Post-menopausal  Other Topics Concern  . None  Social History Narrative   Meagan Allen was born in [place], [place]. She moved to [place] in 1978 when her family moved to this state. Meagan Allen currently lives in [place] with her husband of 79 years. They have 2 adult children and 1 grandson. 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 She enjoys gardening.     Family History: The patient's family history includes Arthritis in her mother; Asthma in her son; Cancer in her brother; Depression in her mother; Diabetes in her father; Gout in her brother, brother, and brother; Heart disease in her father and sister; Hypertension in her father; Stroke in her mother. ROS:   Please see the history of present illness.    All 14 point review of systems negative except as described per history of present illness.  EKGs/Labs/Other Studies Reviewed:    The following studies were reviewed today:  EKG:  EKG is  ordered today.  The ekg ordered today demonstrates showed normal sinus rhythm, normal P interval, evidence of left ventricular hypertrophy poor R wave progression anterior precordium.  Recent Labs: No results found for requested labs within last 8760 hours.  Recent Lipid Panel No results found for: CHOL, TRIG, HDL, CHOLHDL, VLDL, LDLCALC, LDLDIRECT  Physical Exam:    VS:  BP 120/80 (BP Location: Right Arm, Patient Position: Sitting, Cuff Size: Large)   Pulse 95   Ht 5' 7"  (1.702 m)   Wt 226 lb (102.5 kg)   SpO2 96%   BMI 35.40 kg/m     Wt Readings from Last 3 Encounters:  08/14/17 226 lb (102.5 kg)  06/12/16 215 lb (97.5 kg)  05/21/14 232 lb (105.2 kg)     GEN:  Well nourished, well developed in no acute distress HEENT: Normal NECK: No JVD; No carotid bruits LYMPHATICS: No lymphadenopathy CARDIAC: RRR, no murmurs, no rubs, no gallops RESPIRATORY:  Clear to auscultation without rales, wheezing or rhonchi  ABDOMEN: Soft, non-tender, non-distended MUSCULOSKELETAL:  No edema; No deformity  SKIN: Warm and dry NEUROLOGIC:  Alert and oriented x 3 PSYCHIATRIC:  Normal affect   ASSESSMENT:    1. Type 2 diabetes mellitus with diabetic polyneuropathy, unspecified whether long term insulin use ([place])   2. Pure hypercholesterolemia   3. Hx of thyroid cancer   4.  Essential hypertension   5. Palpitations    PLAN:    In order of problems listed above:  1. Preop evaluation for this lady with multiple risk factors for coronary artery disease.  On top of that she does have some symptoms somewhat atypical but I think the most prudent approach to this complex situation will be to perform echocardiogram to assess her left ventricular ejection fraction as well as stress test to rule out significant ischemia.  If those 2 tests are fine this should be no problem with proceeding with surgery from cardiac standpoint review. 2. Dyslipidemia with intolerance to statin.  This is something we will revisit in the future she may benefit from injectable medications. 3. History of thyroid cancer.  Followed by internal medicine team and Endocrinology 4. Essential hypertension: Doing well from that point review continue present management. 5. Palpitations denies having any recently.   Medication Adjustments/Labs and Tests Ordered: Current medicines are reviewed at length with the patient today.  Concerns regarding medicines are outlined above.  Orders Placed This Encounter  Procedures  . MYOCARDIAL PERFUSION IMAGING  . ECHOCARDIOGRAM COMPLETE   No orders of the defined types were placed in this encounter.   Signed, Park Liter, MD, [facility]. 08/14/2017 11:43 AM    [place]

## 2017-08-14 NOTE — Addendum Note (Signed)
Addended by: Aleatha Borer on: 08/14/2017 01:58 PM   Modules accepted: Orders

## 2017-08-14 NOTE — Patient Instructions (Signed)
Medication Instructions:  Your physician recommends that you continue on your current medications as directed. Please refer to the Current Medication list given to you today.  Labwork: None ordered  Testing/Procedures: EKG today  Your physician has requested that you have an echocardiogram. Echocardiography is a painless test that uses sound waves to create images of your heart. It provides your doctor with information about the size and shape of your heart and how well your heart's chambers and valves are working. This procedure takes approximately one hour. There are no restrictions for this procedure.  Your physician has requested that you have a lexiscan myoview. For further information please visit [email or web address]. Please follow instruction sheet, as given.  Follow-Up: Your physician recommends that you schedule a follow-up appointment in: 2 months with Dr. Agustin Cree   Any Other Special Instructions Will Be Listed Below (If Applicable).     If you need a refill on your cardiac medications before your next appointment, please call your pharmacy.

## 2017-08-15 ENCOUNTER — Telehealth (HOSPITAL_COMMUNITY): Payer: Self-pay | Admitting: *Deleted

## 2017-08-15 NOTE — Telephone Encounter (Signed)
Patient given detailed instructions per Myocardial Perfusion Study Information Sheet for the test on 08/20/17. Patient notified to arrive 15 minutes early and that it is imperative to arrive on time for appointment to keep from having the test rescheduled.  If you need to cancel or reschedule your appointment, please call the office within 24 hours of your appointment. . Patient verbalized understanding.Meagan Allen

## 2017-08-20 ENCOUNTER — Ambulatory Visit (HOSPITAL_BASED_OUTPATIENT_CLINIC_OR_DEPARTMENT_OTHER): Payer: BLUE CROSS/BLUE SHIELD

## 2017-08-20 ENCOUNTER — Other Ambulatory Visit: Payer: Self-pay

## 2017-08-20 ENCOUNTER — Ambulatory Visit (HOSPITAL_COMMUNITY): Payer: BLUE CROSS/BLUE SHIELD | Attending: Cardiovascular Disease

## 2017-08-20 DIAGNOSIS — E1142 Type 2 diabetes mellitus with diabetic polyneuropathy: Secondary | ICD-10-CM

## 2017-08-20 DIAGNOSIS — E7800 Pure hypercholesterolemia, unspecified: Secondary | ICD-10-CM

## 2017-08-20 DIAGNOSIS — I10 Essential (primary) hypertension: Secondary | ICD-10-CM

## 2017-08-20 DIAGNOSIS — Z85850 Personal history of malignant neoplasm of thyroid: Secondary | ICD-10-CM

## 2017-08-20 MED ORDER — TECHNETIUM TC 99M TETROFOSMIN IV KIT
33.0000 | PACK | Freq: Once | INTRAVENOUS | Status: AC | PRN
  Administered 2017-08-20: 33 via INTRAVENOUS
  Filled 2017-08-20: qty 33

## 2017-08-20 MED ORDER — REGADENOSON 0.4 MG/5ML IV SOLN
0.4000 mg | Freq: Once | INTRAVENOUS | Status: AC
  Administered 2017-08-20: 0.4 mg via INTRAVENOUS

## 2017-08-21 ENCOUNTER — Ambulatory Visit (HOSPITAL_COMMUNITY): Payer: BLUE CROSS/BLUE SHIELD | Attending: Cardiovascular Disease

## 2017-08-21 LAB — MYOCARDIAL PERFUSION IMAGING
LV dias vol: 120 mL (ref 46–106)
LV sys vol: 77 mL
Peak HR: 100 {beats}/min
RATE: 0.39
Rest HR: 79 {beats}/min
SDS: 8
SRS: 15
SSS: 23
TID: 0.81

## 2017-08-21 MED ORDER — TECHNETIUM TC 99M TETROFOSMIN IV KIT
32.8000 | PACK | Freq: Once | INTRAVENOUS | Status: AC | PRN
  Administered 2017-08-21: 32.8 via INTRAVENOUS
  Filled 2017-08-21: qty 33

## 2017-08-22 ENCOUNTER — Ambulatory Visit (INDEPENDENT_AMBULATORY_CARE_PROVIDER_SITE_OTHER): Payer: BLUE CROSS/BLUE SHIELD | Admitting: Cardiology

## 2017-08-22 ENCOUNTER — Encounter: Payer: Self-pay | Admitting: Cardiology

## 2017-08-22 ENCOUNTER — Ambulatory Visit (HOSPITAL_BASED_OUTPATIENT_CLINIC_OR_DEPARTMENT_OTHER)
Admission: RE | Admit: 2017-08-22 | Discharge: 2017-08-22 | Disposition: A | Discharge status: Home / Self Care | Payer: BLUE CROSS/BLUE SHIELD | Source: Ambulatory Visit | Attending: Cardiology | Admitting: Cardiology

## 2017-08-22 VITALS — BP 130/82 | HR 81 | Ht 67.0 in | Wt 229.0 lb

## 2017-08-22 DIAGNOSIS — I428 Other cardiomyopathies: Secondary | ICD-10-CM | POA: Insufficient documentation

## 2017-08-22 DIAGNOSIS — I119 Hypertensive heart disease without heart failure: Secondary | ICD-10-CM | POA: Diagnosis not present

## 2017-08-22 DIAGNOSIS — K51019 Ulcerative (chronic) pancolitis with unspecified complications: Secondary | ICD-10-CM | POA: Diagnosis not present

## 2017-08-22 DIAGNOSIS — R9439 Abnormal result of other cardiovascular function study: Secondary | ICD-10-CM | POA: Insufficient documentation

## 2017-08-22 DIAGNOSIS — E1142 Type 2 diabetes mellitus with diabetic polyneuropathy: Secondary | ICD-10-CM | POA: Diagnosis not present

## 2017-08-22 DIAGNOSIS — Z01818 Encounter for other preprocedural examination: Secondary | ICD-10-CM | POA: Diagnosis present

## 2017-08-22 DIAGNOSIS — I255 Ischemic cardiomyopathy: Secondary | ICD-10-CM | POA: Diagnosis not present

## 2017-08-22 DIAGNOSIS — I10 Essential (primary) hypertension: Secondary | ICD-10-CM | POA: Diagnosis not present

## 2017-08-22 DIAGNOSIS — I429 Cardiomyopathy, unspecified: Secondary | ICD-10-CM | POA: Insufficient documentation

## 2017-08-22 DIAGNOSIS — K5190 Ulcerative colitis, unspecified, without complications: Secondary | ICD-10-CM | POA: Insufficient documentation

## 2017-08-22 NOTE — H&P (View-Only) (Signed)
Cardiology Office Note:    Date:  08/22/2017   ID:  Meagan Allen, DOB February 16, 1956, MRN [id number]  PCP:  Dianna Rossetti, NP  Cardiologist:  Jenne Campus, MD    Referring MD: Dianna Rossetti, NP   Chief Complaint  Patient presents with  . Follow-up    Echo and stress  Have abnormal tests  History of Present Illness:    Meagan Allen is a 62 y.o. female with multiple risk factors for coronary artery disease.  She had left shoulder injury and her initial presentation was to obtain cardiac evaluation before her surgery.  I did stress test as well as echocardiogram on her and finding with surprising.  She does have significantly diminished left ventricular ejection fraction by echocardiogram with ejection fraction of 25-30%.  She also had a stress showing old myocardial infarction with peri-infarct ischemia.  She does not have any typical symptoms of chest pain tightness squeezing pressure burning chest but finding of those 2 tests are alarming.  In 2013 she had cardiac catheterization done and I have results of it which showed normal coronaries with normal left ventricular ejection fraction.  Spent a great deal of time talking about what to do with the situation and I think the best option will be to proceed again with cardiac catheterization.  Procedure was explained to her including all risk benefits as well as alternatives. Situation is quite complicated.  She does have ulcerative colitis which was finally diagnosed in September of last year.  She did have some GI bleeding however situation seems to be cooled down with appropriate medications that include steroids.  She denies having any recent GI bleeding.  I told her that we will check her laboratory tests including stool for guaiac.  Will ask you to start taking one baby aspirin a day.  Then we will proceed with cardiac catheterization with the understanding if she required some think we may go with non-drug-eluting stent to shorten the.   Of dual antiplatelets therapy.  Of course the key will be to identified what the problem seems to be and based on that decide what will be the best option for appropriate therapy.  Obviously surgery is on hold for now until we clarify this scenario. We also need to start treating her for her cardiomyopathy.  I will check her Chem-7 today and if everything is fine she will initiate Entresto.  She is very familiar with the condition her husband does have a problem like that and he is being on [place] and doing quite well.  Past Medical History:  Diagnosis Date  . Allergy   . Anxiety   . Arthritis   . Cancer ([place]) 2007, 2009   papillary thyroid CA with mets to cervial lymph nodes  . Colon polyps   . Depression   . Diabetes mellitus   . Fibromyalgia   . GERD (gastroesophageal reflux disease)   . Hearing loss of right ear   . Heart murmur   . Hyperlipidemia   . Hypertension   . IBS (irritable bowel syndrome)   . Migraine   . PONV (postoperative nausea and vomiting)   . Rosacea   . 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 UTI (lower urinary tract infection)    none recent    Past Surgical History:  Procedure Laterality Date  . CARDIAC CATHETERIZATION  02-09-2012   results in epic, no ekg found  . COLONOSCOPY WITH PROPOFOL N/A 05/06/2013   Procedure: COLONOSCOPY WITH PROPOFOL;  Surgeon:  Garlan Fair, MD;  Location: [facility];  Service: Endoscopy;  Laterality: N/A;  . DILATION AND CURETTAGE OF UTERUS  1984  . giant cell tumor  2011   resected form right ankle  . JOINT REPLACEMENT     bilateral thumb  . left knee surgery     x 2, torn meniscus  . neck resection  08/2007  . THYROIDECTOMY  2007 and 2009    Current Medications: Current Meds  Medication Sig  . buPROPion (WELLBUTRIN XL) 150 MG 24 hr tablet Take 450 mg by mouth every morning.   . cetirizine (ZYRTEC) 10 MG tablet Take 10 mg by mouth daily.  . clonazePAM (KLONOPIN) 0.5 MG tablet Take 0.5 mg by mouth 2 (two) times  daily as needed (sleep).   Marland Kitchen erythromycin (ERY-TAB) 250 MG EC tablet Take 250 mg by mouth 3 (three) times daily.  . Insulin Glargine (LANTUS SOLOSTAR) 100 UNIT/ML Solostar Pen Inject 70 Units into the skin daily at 10 pm.  . levothyroxine (SYNTHROID, LEVOTHROID) 150 MCG tablet Take 150 mcg by mouth daily before breakfast.  . liraglutide (VICTOZA) 18 MG/3ML SOPN Inject into the skin.  Marland Kitchen losartan (COZAAR) 25 MG tablet Take 25 mg by mouth daily.  . mesalamine (LIALDA) 1.2 g EC tablet Take 2.4 g by mouth 2 (two) times daily.   . metFORMIN (GLUCOPHAGE-XR) 500 MG 24 hr tablet Take 1,000 mg by mouth every evening.  . methocarbamol (ROBAXIN) 500 MG tablet Take 500 mg by mouth 3 (three) times daily.   . metoprolol (LOPRESSOR) 100 MG tablet Take 100 mg by mouth daily.   . Misc Natural Products (COSAMIN ASU ADVANCED FORMULA) CAPS Take 1 capsule by mouth daily.  . pantoprazole (PROTONIX) 40 MG tablet Take 40 mg by mouth daily.  . polyethylene glycol powder (GLYCOLAX/MIRALAX) powder Take 17 g by mouth daily.      Allergies:   Sulfa antibiotics; Bydureon [exenatide]; Invokana [canagliflozin]; Jardiance [empagliflozin]; Lisinopril; and Pravastatin   Social History   Socioeconomic History  . Marital status: Married    Spouse name: Jeneen Rinks  . Number of children: 2  . Years of education: 20  . Highest education level: None  Social Needs  . Financial resource strain: None  . Food insecurity - worry: None  . Food insecurity - inability: None  . Transportation needs - medical: None  . Transportation needs - non-medical: None  Occupational History  . Occupation: Banker for [employer]: [employer]  Tobacco Use  . Smoking status: Former Smoker    Packs/day: 3.00    Years: 10.00    Pack years: 30.00    Types: Cigarettes    Last attempt to quit: 07/17/1980    Years since quitting: 37.1  . Smokeless tobacco: Never Used  Substance and Sexual Activity  . Alcohol use: No      Comment: Rare use  . Drug use: No  . Sexual activity: Yes    Birth control/protection: Post-menopausal  Other Topics Concern  . None  Social History Narrative   Adrijana was born in [place], [place]. She moved to [place] in 1978 when her family moved to this state. Makhiya currently lives in [place] with her husband of 38 years. They have 2 adult children and 1 grandson. 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     Family History: The patient's family history includes Arthritis in her mother; Asthma in her son; Cancer in her  brother; Depression in her mother; Diabetes in her father; Gout in her brother, brother, and brother; Heart disease in her father and sister; Hypertension in her father; Stroke in her mother. ROS:   Please see the history of present illness.    All 14 point review of systems negative except as described per history of present illness  EKGs/Labs/Other Studies Reviewed:      Recent Labs: No results found for requested labs within last 8760 hours.  Recent Lipid Panel No results found for: CHOL, TRIG, HDL, CHOLHDL, VLDL, LDLCALC, LDLDIRECT  Physical Exam:    VS:  BP 130/82 (BP Location: Left Arm, Patient Position: Sitting, Cuff Size: Normal)   Pulse 81   Ht 5' 7"  (1.702 m)   Wt 229 lb (103.9 kg)   SpO2 98%   BMI 35.87 kg/m     Wt Readings from Last 3 Encounters:  08/22/17 229 lb (103.9 kg)  08/20/17 226 lb (102.5 kg)  08/14/17 226 lb (102.5 kg)     GEN:  Well nourished, well developed in no acute distress HEENT: Normal NECK: No JVD; No carotid bruits LYMPHATICS: No lymphadenopathy CARDIAC: RRR, no murmurs, no rubs, no gallops RESPIRATORY:  Clear to auscultation without rales, wheezing or rhonchi  ABDOMEN: Soft, non-tender, non-distended MUSCULOSKELETAL:  No edema; No deformity  SKIN: Warm and dry LOWER EXTREMITIES: no swelling NEUROLOGIC:  Alert and oriented x 3 PSYCHIATRIC:  Normal affect   ASSESSMENT:     1. Essential hypertension   2. Ischemic cardiomyopathy   3. Type 2 diabetes mellitus with diabetic polyneuropathy, unspecified whether long term insulin use ([place])   4. Ulcerative pancolitis with complication ([place])   5. Abnormal stress test    PLAN:    In order of problems listed above:  1. Essential hypertension: Blood pressure little elevated today but again with planning to initiate therapy for congestive heart failure and cardiomyopathy. 2. Cardiomyopathy which appears to be ischemic in origin cardiac catheterization is planned with some precaution as described above. 3. Type 2 diabetes: Stable followed by internal medicine team. 4. Ulcerative pancolitis with some history of GI bleeding apparently lately stable. 5. Abnormal stress test indicating all myocardial infarction with some peri-infarct ischemia: Cardiac catheterization is planned   Medication Adjustments/Labs and Tests Ordered: Current medicines are reviewed at length with the patient today.  Concerns regarding medicines are outlined above.  No orders of the defined types were placed in this encounter.  Medication changes: No orders of the defined types were placed in this encounter.   Signed, Park Liter, MD, [facility] 08/22/2017 4:16 PM    [place]

## 2017-08-22 NOTE — Progress Notes (Signed)
Cardiology Office Note:    Date:  08/22/2017   ID:  Meagan Allen, DOB [birth year], MRN [id number]  PCP:  Dianna Rossetti, NP  Cardiologist:  Jenne Campus, MD    Referring MD: Dianna Rossetti, NP   Chief Complaint  Patient presents with  . Follow-up    Echo and stress  Have abnormal tests  History of Present Illness:    Meagan Allen is a 62 y.o. female with multiple risk factors for coronary artery disease.  She had left shoulder injury and her initial presentation was to obtain cardiac evaluation before her surgery.  I did stress test as well as echocardiogram on her and finding with surprising.  She does have significantly diminished left ventricular ejection fraction by echocardiogram with ejection fraction of 25-30%.  She also had a stress showing old myocardial infarction with peri-infarct ischemia.  She does not have any typical symptoms of chest pain tightness squeezing pressure burning chest but finding of those 2 tests are alarming.  In 2013 she had cardiac catheterization done and I have results of it which showed normal coronaries with normal left ventricular ejection fraction.  Spent a great deal of time talking about what to do with the situation and I think the best option will be to proceed again with cardiac catheterization.  Procedure was explained to her including all risk benefits as well as alternatives. Situation is quite complicated.  She does have ulcerative colitis which was finally diagnosed in September of last year.  She did have some GI bleeding however situation seems to be cooled down with appropriate medications that include steroids.  She denies having any recent GI bleeding.  I told her that we will check her laboratory tests including stool for guaiac.  Will ask you to start taking one baby aspirin a day.  Then we will proceed with cardiac catheterization with the understanding if she required some think we may go with non-drug-eluting stent to shorten the.   Of dual antiplatelets therapy.  Of course the key will be to identified what the problem seems to be and based on that decide what will be the best option for appropriate therapy.  Obviously surgery is on hold for now until we clarify this scenario. We also need to start treating her for her cardiomyopathy.  I will check her Chem-7 today and if everything is fine she will initiate Entresto.  She is very familiar with the condition her husband does have a problem like that and he is being on [place] and doing quite well.  Past Medical History:  Diagnosis Date  . Allergy   . Anxiety   . Arthritis   . Cancer ([place]) 2007, 2009   papillary thyroid CA with mets to cervial lymph nodes  . Colon polyps   . Depression   . Diabetes mellitus   . Fibromyalgia   . GERD (gastroesophageal reflux disease)   . Hearing loss of right ear   . Heart murmur   . Hyperlipidemia   . Hypertension   . IBS (irritable bowel syndrome)   . Migraine   . PONV (postoperative nausea and vomiting)   . Rosacea   . 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 UTI (lower urinary tract infection)    none recent    Past Surgical History:  Procedure Laterality Date  . CARDIAC CATHETERIZATION  02-09-2012   results in epic, no ekg found  . COLONOSCOPY WITH PROPOFOL N/A 05/06/2013   Procedure: COLONOSCOPY WITH PROPOFOL;  Surgeon:  Garlan Fair, MD;  Location: [facility];  Service: Endoscopy;  Laterality: N/A;  . DILATION AND CURETTAGE OF UTERUS  1984  . giant cell tumor  2011   resected form right ankle  . JOINT REPLACEMENT     bilateral thumb  . left knee surgery     x 2, torn meniscus  . neck resection  08/2007  . THYROIDECTOMY  2007 and 2009    Current Medications: Current Meds  Medication Sig  . buPROPion (WELLBUTRIN XL) 150 MG 24 hr tablet Take 450 mg by mouth every morning.   . cetirizine (ZYRTEC) 10 MG tablet Take 10 mg by mouth daily.  . clonazePAM (KLONOPIN) 0.5 MG tablet Take 0.5 mg by mouth 2 (two) times  daily as needed (sleep).   Marland Kitchen erythromycin (ERY-TAB) 250 MG EC tablet Take 250 mg by mouth 3 (three) times daily.  . Insulin Glargine (LANTUS SOLOSTAR) 100 UNIT/ML Solostar Pen Inject 70 Units into the skin daily at 10 pm.  . levothyroxine (SYNTHROID, LEVOTHROID) 150 MCG tablet Take 150 mcg by mouth daily before breakfast.  . liraglutide (VICTOZA) 18 MG/3ML SOPN Inject into the skin.  Marland Kitchen losartan (COZAAR) 25 MG tablet Take 25 mg by mouth daily.  . mesalamine (LIALDA) 1.2 g EC tablet Take 2.4 g by mouth 2 (two) times daily.   . metFORMIN (GLUCOPHAGE-XR) 500 MG 24 hr tablet Take 1,000 mg by mouth every evening.  . methocarbamol (ROBAXIN) 500 MG tablet Take 500 mg by mouth 3 (three) times daily.   . metoprolol (LOPRESSOR) 100 MG tablet Take 100 mg by mouth daily.   . Misc Natural Products (COSAMIN ASU ADVANCED FORMULA) CAPS Take 1 capsule by mouth daily.  . pantoprazole (PROTONIX) 40 MG tablet Take 40 mg by mouth daily.  . polyethylene glycol powder (GLYCOLAX/MIRALAX) powder Take 17 g by mouth daily.      Allergies:   Sulfa antibiotics; Bydureon [exenatide]; Invokana [canagliflozin]; Jardiance [empagliflozin]; Lisinopril; and Pravastatin   Social History   Socioeconomic History  . Marital status: Married    Spouse name: Jeneen Rinks  . Number of children: 2  . Years of education: 9  . Highest education level: None  Social Needs  . Financial resource strain: None  . Food insecurity - worry: None  . Food insecurity - inability: None  . Transportation needs - medical: None  . Transportation needs - non-medical: None  Occupational History  . Occupation: Banker for [employer]:  [employer]  Tobacco Use  . Smoking status: Former Smoker    Packs/day: 3.00    Years: 10.00    Pack years: 30.00    Types: Cigarettes    Last attempt to quit: 07/17/1980    Years since quitting: 37.1  . Smokeless tobacco: Never Used  Substance and Sexual Activity  . Alcohol use: No      Comment: Rare use  . Drug use: No  . Sexual activity: Yes    Birth control/protection: Post-menopausal  Other Topics Concern  . None  Social History Narrative   Meagan Allen was born in [place], [place]. She moved to [place] in 1978 when her family moved to this state. Latosha currently lives in [place] with her husband of 81 years. They have 2 adult children and 1 grandson. 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     Family History: The patient's family history includes Arthritis in her mother; Asthma in her son; Cancer in her  brother; Depression in her mother; Diabetes in her father; Gout in her brother, brother, and brother; Heart disease in her father and sister; Hypertension in her father; Stroke in her mother. ROS:   Please see the history of present illness.    All 14 point review of systems negative except as described per history of present illness  EKGs/Labs/Other Studies Reviewed:      Recent Labs: No results found for requested labs within last 8760 hours.  Recent Lipid Panel No results found for: CHOL, TRIG, HDL, CHOLHDL, VLDL, LDLCALC, LDLDIRECT  Physical Exam:    VS:  BP 130/82 (BP Location: Left Arm, Patient Position: Sitting, Cuff Size: Normal)   Pulse 81   Ht 5' 7"  (1.702 m)   Wt 229 lb (103.9 kg)   SpO2 98%   BMI 35.87 kg/m     Wt Readings from Last 3 Encounters:  08/22/17 229 lb (103.9 kg)  08/20/17 226 lb (102.5 kg)  08/14/17 226 lb (102.5 kg)     GEN:  Well nourished, well developed in no acute distress HEENT: Normal NECK: No JVD; No carotid bruits LYMPHATICS: No lymphadenopathy CARDIAC: RRR, no murmurs, no rubs, no gallops RESPIRATORY:  Clear to auscultation without rales, wheezing or rhonchi  ABDOMEN: Soft, non-tender, non-distended MUSCULOSKELETAL:  No edema; No deformity  SKIN: Warm and dry LOWER EXTREMITIES: no swelling NEUROLOGIC:  Alert and oriented x 3 PSYCHIATRIC:  Normal affect   ASSESSMENT:     1. Essential hypertension   2. Ischemic cardiomyopathy   3. Type 2 diabetes mellitus with diabetic polyneuropathy, unspecified whether long term insulin use ([place])   4. Ulcerative pancolitis with complication ([place])   5. Abnormal stress test    PLAN:    In order of problems listed above:  1. Essential hypertension: Blood pressure little elevated today but again with planning to initiate therapy for congestive heart failure and cardiomyopathy. 2. Cardiomyopathy which appears to be ischemic in origin cardiac catheterization is planned with some precaution as described above. 3. Type 2 diabetes: Stable followed by internal medicine team. 4. Ulcerative pancolitis with some history of GI bleeding apparently lately stable. 5. Abnormal stress test indicating all myocardial infarction with some peri-infarct ischemia: Cardiac catheterization is planned   Medication Adjustments/Labs and Tests Ordered: Current medicines are reviewed at length with the patient today.  Concerns regarding medicines are outlined above.  No orders of the defined types were placed in this encounter.  Medication changes: No orders of the defined types were placed in this encounter.   Signed, Park Liter, MD, [facility] 08/22/2017 4:16 PM    [place]

## 2017-08-22 NOTE — Patient Instructions (Signed)
Medication Instructions:  Your physician has recommended you make the following change in your medication:  Start taking an 81 mg Asprin daily  Labwork: Your physician recommends that you have lab work today: BMP, CBC, PT/INR, IFob  Testing/Procedures: A chest x-ray takes a picture of the organs and structures inside the chest, including the heart, lungs, and blood vessels. This test can show several things, including, whether the heart is enlarges; whether fluid is building up in the lungs; and whether pacemaker / defibrillator leads are still in place.  Follow-Up: Your physician recommends that you schedule a follow-up appointment in: 3 week follow up with Dr. Agustin Cree   Any Other Special Instructions Will Be Listed Below (If Applicable).     If you need a refill on your cardiac medications before your next appointment, please call your pharmacy.

## 2017-08-23 LAB — CBC
Hematocrit: 36.3 % (ref 34.0–46.6)
Hemoglobin: 12.3 g/dL (ref 11.1–15.9)
MCH: 28.2 pg (ref 26.6–33.0)
MCHC: 33.9 g/dL (ref 31.5–35.7)
MCV: 83 fL (ref 79–97)
Platelets: 369 10*3/uL (ref 150–379)
RBC: 4.36 x10E6/uL (ref 3.77–5.28)
RDW: 16.1 % — ABNORMAL HIGH (ref 12.3–15.4)
WBC: 8.6 10*3/uL (ref 3.4–10.8)

## 2017-08-23 LAB — BASIC METABOLIC PANEL
BUN/Creatinine Ratio: 14 (ref 12–28)
BUN: 11 mg/dL (ref 8–27)
CO2: 24 mmol/L (ref 20–29)
Calcium: 10.5 mg/dL — ABNORMAL HIGH (ref 8.7–10.3)
Chloride: 102 mmol/L (ref 96–106)
Creatinine, Ser: 0.77 mg/dL (ref 0.57–1.00)
GFR calc Af Amer: 95 mL/min/{1.73_m2} (ref >59)
GFR calc non Af Amer: 82 mL/min/{1.73_m2} (ref >59)
Glucose: 151 mg/dL — ABNORMAL HIGH (ref 65–99)
Potassium: 4.6 mmol/L (ref 3.5–5.2)
Sodium: 142 mmol/L (ref 134–144)

## 2017-08-23 LAB — PROTIME-INR
INR: 0.9 (ref 0.8–1.2)
Prothrombin Time: 9.8 s (ref 9.1–12.0)

## 2017-08-24 ENCOUNTER — Telehealth: Payer: Self-pay | Admitting: Cardiology

## 2017-08-24 MED ORDER — METOPROLOL SUCCINATE ER 100 MG PO TB24: 100.0000 mg | ORAL_TABLET | Freq: Every day | ORAL | 3 refills | Status: DC

## 2017-08-24 MED ORDER — SACUBITRIL-VALSARTAN 24-26 MG PO TABS: 1.0000 | ORAL_TABLET | Freq: Two times a day (BID) | ORAL | 3 refills | Status: DC

## 2017-08-24 NOTE — Telephone Encounter (Signed)
Patient informed of results of lab work. Advised to stop losartan. Start Entresto 24-26 mg twice daily. Advised metoprolol would be changing to metoprolol succinate. Patient verbalized understanding of medication changes. No further questions.  Reviewed cath instructions as below:  @LOGO @  [facility] [address] [place] [address] Dept: [phone] Loc: [place]  08/24/2017  You are scheduled for a Cardiac Catheterization on Tuesday, February 12 with Dr. Shelva Majestic.  1. Please arrive at the [facility] (Main Entrance A) at [facility]: [address] [place], [place] [address] at 11:30 AM (two hours before your procedure to ensure your preparation). Free valet parking service is available.   Special note: Every effort is made to have your procedure done on time. Please understand that emergencies sometimes delay scheduled procedures.  2. Diet: Do not eat or drink anything after midnight prior to your procedure except sips of water to take medications.  3. Labs: None needed. Already checked.  4. Medication instructions in preparation for your procedure:  Take only 35 units of insulin the night before your procedure. Do not take any insulin on the day of the procedure.  Hold Victoza the night before.  Hold metformin 24 hours prior, 48 hours after.  On the morning of your procedure, take your Aspirin and any morning medicines NOT listed above.  You may use sips of water.  5. Plan for one night stay--bring personal belongings. 6. Bring a current list of your medications and current insurance cards. 7. You MUST have a responsible person to drive you home. 8. Someone MUST be with you the first 24 hours after you arrive home or your discharge will be delayed. 9. Please wear clothes that are easy to get on and off and wear slip-on shoes.  Thank you for  allowing [place] to care for you!   -- [facility]  All questions answered. Patient verbalized understanding.

## 2017-08-24 NOTE — Telephone Encounter (Signed)
Left message to return call. Cardiac catheterization scheduled for Tuesday 08/28/17 with Dr. Claiborne Billings at 1:30 pm.

## 2017-08-24 NOTE — Addendum Note (Signed)
Addended by: Jenne Campus on: 08/24/2017 12:05 PM   Modules accepted: Orders, SmartSet

## 2017-08-24 NOTE — Telephone Encounter (Signed)
Meagan Allen is calling to ask the status of her heart cath.

## 2017-08-24 NOTE — Telephone Encounter (Signed)
Please put in orders for cath

## 2017-08-24 NOTE — Addendum Note (Signed)
Addended by: Warner Mccreedy E on: 08/24/2017 02:19 PM   Modules accepted: Orders

## 2017-08-26 LAB — SPECIMEN STATUS REPORT

## 2017-08-26 LAB — FECAL OCCULT BLOOD, IMMUNOCHEMICAL: Fecal Occult Bld: POSITIVE — AB

## 2017-08-27 ENCOUNTER — Telehealth: Payer: Self-pay | Admitting: *Deleted

## 2017-08-27 NOTE — Telephone Encounter (Signed)
Pt contacted pre-catheterization scheduled at [facility] for: Tuesday February 11,2019 at 1:30 PM Verified arrival time and place: [place] at: 11:30 AM Verified allergies listed in Epic.  Verified HOLD medications: No metformin 2/11, 2/12 and for 48 hours post cath No Insulin AM of cath 1/2 Insulin PM before cath No Victoza PM before cath  Except HOLD medications verified AM meds can be  taken pre-cath with sip of water including: ASA 81 mg am of cath  Confirmed patient has responsible person to drive home post procedure and observe patient for 24 hours: yes

## 2017-08-28 ENCOUNTER — Ambulatory Visit (HOSPITAL_COMMUNITY)
Admission: RE | Admit: 2017-08-28 | Discharge: 2017-08-28 | Disposition: A | Discharge status: Home / Self Care | Payer: BLUE CROSS/BLUE SHIELD | Source: Ambulatory Visit | Attending: Cardiovascular Disease | Admitting: Cardiovascular Disease

## 2017-08-28 ENCOUNTER — Ambulatory Visit (HOSPITAL_COMMUNITY)
Admission: RE | Disposition: A | Discharge status: Home / Self Care | Payer: Self-pay | Source: Ambulatory Visit | Attending: Cardiovascular Disease

## 2017-08-28 DIAGNOSIS — I428 Other cardiomyopathies: Secondary | ICD-10-CM

## 2017-08-28 DIAGNOSIS — K219 Gastro-esophageal reflux disease without esophagitis: Secondary | ICD-10-CM | POA: Diagnosis not present

## 2017-08-28 DIAGNOSIS — Z87891 Personal history of nicotine dependence: Secondary | ICD-10-CM | POA: Diagnosis not present

## 2017-08-28 DIAGNOSIS — Z794 Long term (current) use of insulin: Secondary | ICD-10-CM | POA: Diagnosis not present

## 2017-08-28 DIAGNOSIS — F419 Anxiety disorder, unspecified: Secondary | ICD-10-CM | POA: Insufficient documentation

## 2017-08-28 DIAGNOSIS — I10 Essential (primary) hypertension: Secondary | ICD-10-CM | POA: Diagnosis not present

## 2017-08-28 DIAGNOSIS — E785 Hyperlipidemia, unspecified: Secondary | ICD-10-CM | POA: Diagnosis not present

## 2017-08-28 DIAGNOSIS — K5100 Ulcerative (chronic) pancolitis without complications: Secondary | ICD-10-CM | POA: Insufficient documentation

## 2017-08-28 DIAGNOSIS — R9439 Abnormal result of other cardiovascular function study: Secondary | ICD-10-CM | POA: Diagnosis present

## 2017-08-28 DIAGNOSIS — I255 Ischemic cardiomyopathy: Secondary | ICD-10-CM

## 2017-08-28 DIAGNOSIS — G43909 Migraine, unspecified, not intractable, without status migrainosus: Secondary | ICD-10-CM | POA: Insufficient documentation

## 2017-08-28 DIAGNOSIS — M797 Fibromyalgia: Secondary | ICD-10-CM | POA: Diagnosis not present

## 2017-08-28 DIAGNOSIS — M1990 Unspecified osteoarthritis, unspecified site: Secondary | ICD-10-CM | POA: Insufficient documentation

## 2017-08-28 DIAGNOSIS — F329 Major depressive disorder, single episode, unspecified: Secondary | ICD-10-CM | POA: Diagnosis not present

## 2017-08-28 DIAGNOSIS — Z882 Allergy status to sulfonamides status: Secondary | ICD-10-CM | POA: Insufficient documentation

## 2017-08-28 DIAGNOSIS — K51019 Ulcerative (chronic) pancolitis with unspecified complications: Secondary | ICD-10-CM

## 2017-08-28 DIAGNOSIS — E1142 Type 2 diabetes mellitus with diabetic polyneuropathy: Secondary | ICD-10-CM | POA: Diagnosis not present

## 2017-08-28 HISTORY — PX: LEFT HEART CATH AND CORONARY ANGIOGRAPHY: CATH118249

## 2017-08-28 LAB — CBC
HCT: 34.2 % — ABNORMAL LOW (ref 36.0–46.0)
Hemoglobin: 11 g/dL — ABNORMAL LOW (ref 12.0–15.0)
MCH: 27.6 pg (ref 26.0–34.0)
MCHC: 32.2 g/dL (ref 30.0–36.0)
MCV: 85.7 fL (ref 78.0–100.0)
Platelets: 270 10*3/uL (ref 150–400)
RBC: 3.99 MIL/uL (ref 3.87–5.11)
RDW: 15.6 % — ABNORMAL HIGH (ref 11.5–15.5)
WBC: 5.1 10*3/uL (ref 4.0–10.5)

## 2017-08-28 LAB — GLUCOSE, CAPILLARY: Glucose-Capillary: 120 mg/dL — ABNORMAL HIGH (ref 65–99)

## 2017-08-28 SURGERY — LEFT HEART CATH AND CORONARY ANGIOGRAPHY
Anesthesia: LOCAL

## 2017-08-28 MED ORDER — MIDAZOLAM HCL 2 MG/2ML IJ SOLN
INTRAMUSCULAR | Status: AC
  Filled 2017-08-28: qty 2

## 2017-08-28 MED ORDER — LIDOCAINE HCL (PF) 1 % IJ SOLN
INTRAMUSCULAR | Status: DC | PRN
  Administered 2017-08-28: 2 mL

## 2017-08-28 MED ORDER — SODIUM CHLORIDE 0.9% FLUSH: 3.0000 mL | Freq: Two times a day (BID) | INTRAVENOUS | Status: DC

## 2017-08-28 MED ORDER — HEPARIN (PORCINE) IN NACL 2-0.9 UNIT/ML-% IJ SOLN
INTRAMUSCULAR | Status: AC
  Filled 2017-08-28: qty 1000

## 2017-08-28 MED ORDER — HEPARIN (PORCINE) IN NACL 2-0.9 UNIT/ML-% IJ SOLN
INTRAMUSCULAR | Status: AC | PRN
  Administered 2017-08-28: 1000 mL via INTRA_ARTERIAL

## 2017-08-28 MED ORDER — IOPAMIDOL (ISOVUE-370) INJECTION 76%
INTRAVENOUS | Status: DC | PRN
  Administered 2017-08-28: 60 mL via INTRA_ARTERIAL

## 2017-08-28 MED ORDER — DIAZEPAM 5 MG PO TABS: 5.0000 mg | ORAL_TABLET | ORAL | Status: DC | PRN

## 2017-08-28 MED ORDER — SODIUM CHLORIDE 0.9 % IV SOLN: INTRAVENOUS | Status: DC

## 2017-08-28 MED ORDER — FENTANYL CITRATE (PF) 100 MCG/2ML IJ SOLN
INTRAMUSCULAR | Status: AC
  Filled 2017-08-28: qty 2

## 2017-08-28 MED ORDER — MIDAZOLAM HCL 2 MG/2ML IJ SOLN
INTRAMUSCULAR | Status: DC | PRN
  Administered 2017-08-28: 2 mg via INTRAVENOUS

## 2017-08-28 MED ORDER — SODIUM CHLORIDE 0.9 % IV SOLN: 250.0000 mL | INTRAVENOUS | Status: DC | PRN

## 2017-08-28 MED ORDER — HEPARIN SODIUM (PORCINE) 1000 UNIT/ML IJ SOLN
INTRAMUSCULAR | Status: AC
  Filled 2017-08-28: qty 1

## 2017-08-28 MED ORDER — FENTANYL CITRATE (PF) 100 MCG/2ML IJ SOLN
INTRAMUSCULAR | Status: DC | PRN
  Administered 2017-08-28: 25 ug via INTRAVENOUS

## 2017-08-28 MED ORDER — SODIUM CHLORIDE 0.9 % WEIGHT BASED INFUSION: 1.0000 mL/kg/h | INTRAVENOUS | Status: DC

## 2017-08-28 MED ORDER — VERAPAMIL HCL 2.5 MG/ML IV SOLN
INTRAVENOUS | Status: AC
  Filled 2017-08-28: qty 2

## 2017-08-28 MED ORDER — SODIUM CHLORIDE 0.9% FLUSH: 3.0000 mL | INTRAVENOUS | Status: DC | PRN

## 2017-08-28 MED ORDER — IOPAMIDOL (ISOVUE-370) INJECTION 76%
INTRAVENOUS | Status: AC
  Filled 2017-08-28: qty 100

## 2017-08-28 MED ORDER — LIDOCAINE HCL (PF) 1 % IJ SOLN
INTRAMUSCULAR | Status: AC
  Filled 2017-08-28: qty 30

## 2017-08-28 MED ORDER — HEPARIN SODIUM (PORCINE) 1000 UNIT/ML IJ SOLN
INTRAMUSCULAR | Status: DC | PRN
  Administered 2017-08-28: 5000 [IU] via INTRAVENOUS

## 2017-08-28 MED ORDER — ONDANSETRON HCL 4 MG/2ML IJ SOLN: 4.0000 mg | Freq: Four times a day (QID) | INTRAMUSCULAR | Status: DC | PRN

## 2017-08-28 MED ORDER — SODIUM CHLORIDE 0.9 % WEIGHT BASED INFUSION
3.0000 mL/kg/h | INTRAVENOUS | Status: AC
  Administered 2017-08-28: 3 mL/kg/h via INTRAVENOUS

## 2017-08-28 MED ORDER — VERAPAMIL HCL 2.5 MG/ML IV SOLN
INTRAVENOUS | Status: DC | PRN
  Administered 2017-08-28: 15:00:00 via INTRA_ARTERIAL

## 2017-08-28 MED ORDER — ACETAMINOPHEN 325 MG PO TABS: 650.0000 mg | ORAL_TABLET | ORAL | Status: DC | PRN

## 2017-08-28 MED ORDER — ASPIRIN 81 MG PO CHEW: 81.0000 mg | CHEWABLE_TABLET | ORAL | Status: DC

## 2017-08-28 SURGICAL SUPPLY — 11 items
CATH INFINITI 5FR ANG PIGTAIL (CATHETERS) ×1 IMPLANT
CATH OPTITORQUE TIG 4.0 5F (CATHETERS) ×1 IMPLANT
DEVICE RAD COMP TR BAND LRG (VASCULAR PRODUCTS) ×1 IMPLANT
GLIDESHEATH SLEND SS 6F .021 (SHEATH) ×1 IMPLANT
GUIDEWIRE INQWIRE 1.5J.035X260 (WIRE) IMPLANT
INQWIRE 1.5J .035X260CM (WIRE) ×2
KIT HEART LEFT (KITS) ×2 IMPLANT
PACK CARDIAC CATHETERIZATION (CUSTOM PROCEDURE TRAY) ×2 IMPLANT
SYR MEDRAD MARK V 150ML (SYRINGE) ×2 IMPLANT
TRANSDUCER W/STOPCOCK (MISCELLANEOUS) ×2 IMPLANT
TUBING CIL FLEX 10 FLL-RA (TUBING) ×2 IMPLANT

## 2017-08-28 NOTE — Discharge Instructions (Signed)
NO METFORMIN/GLUCOPHAGE FOR 2 DAYS ° ° °Radial Site Care °Refer to this sheet in the next few weeks. These instructions provide you with information about caring for yourself after your procedure. Your health care provider may also give you more specific instructions. Your treatment has been planned according to current medical practices, but problems sometimes occur. Call your health care provider if you have any problems or questions after your procedure. °What can I expect after the procedure? °After your procedure, it is typical to have the following: °· Bruising at the radial site that usually fades within 1-2 weeks. °· Blood collecting in the tissue (hematoma) that may be painful to the touch. It should usually decrease in size and tenderness within 1-2 weeks. ° °Follow these instructions at home: °· Take medicines only as directed by your health care provider. °· You may shower 24-48 hours after the procedure or as directed by your health care provider. Remove the bandage (dressing) and gently wash the site with plain soap and water. Pat the area dry with a clean towel. Do not rub the site, because this may cause bleeding. °· Do not take baths, swim, or use a hot tub until your health care provider approves. °· Check your insertion site every day for redness, swelling, or drainage. °· Do not apply powder or lotion to the site. °· Do not flex or bend the affected arm for 24 hours or as directed by your health care provider. °· Do not push or pull heavy objects with the affected arm for 24 hours or as directed by your health care provider. °· Do not lift over 10 lb (4.5 kg) for 5 days after your procedure or as directed by your health care provider. °· Ask your health care provider when it is okay to: °? Return to work or school. °? Resume usual physical activities or sports. °? Resume sexual activity. °· Do not drive home if you are discharged the same day as the procedure. Have someone else drive you. °· You  may drive 24 hours after the procedure unless otherwise instructed by your health care provider. °· Do not operate machinery or power tools for 24 hours after the procedure. °· If your procedure was done as an outpatient procedure, which means that you went home the same day as your procedure, a responsible adult should be with you for the first 24 hours after you arrive home. °· Keep all follow-up visits as directed by your health care provider. This is important. °Contact a health care provider if: °· You have a fever. °· You have chills. °· You have increased bleeding from the radial site. Hold pressure on the site. °Get help right away if: °· You have unusual pain at the radial site. °· You have redness, warmth, or swelling at the radial site. °· You have drainage (other than a small amount of blood on the dressing) from the radial site. °· The radial site is bleeding, and the bleeding does not stop after 30 minutes of holding steady pressure on the site. °· Your arm or hand becomes pale, cool, tingly, or numb. °This information is not intended to replace advice given to you by your health care provider. Make sure you discuss any questions you have with your health care provider. °Document Released: 08/05/2010 Document Revised: 12/09/2015 Document Reviewed: 01/19/2014 °Elsevier Interactive Patient Education © 2018 [employer] ° °

## 2017-08-28 NOTE — Interval H&P Note (Signed)
History and Physical Interval Note:  08/28/2017 2:33 PM  Meagan Allen  has presented today for surgery, with the diagnosis of Abnormal stress test  The various methods of treatment have been discussed with the patient and family. After consideration of risks, benefits and other options for treatment, the patient has consented to  Procedure(s): LEFT HEART CATH AND CORONARY ANGIOGRAPHY (N/A) as a surgical intervention .  The patient's history has been reviewed, patient examined, no change in status, stable for surgery.  I have reviewed the patient's chart and labs.  Questions were answered to the patient's satisfaction.     Shelva Majestic

## 2017-08-29 ENCOUNTER — Encounter (HOSPITAL_COMMUNITY): Payer: Self-pay | Admitting: Cardiovascular Disease

## 2017-08-29 MED FILL — Heparin Sodium (Porcine) 2 Unit/ML in Sodium Chloride 0.9%: INTRAMUSCULAR | Qty: 1000 | Status: AC

## 2017-08-30 ENCOUNTER — Telehealth: Payer: Self-pay | Admitting: Cardiology

## 2017-08-30 NOTE — Telephone Encounter (Signed)
Patient needs surgery clearance for her shoulder surgery.

## 2017-09-12 ENCOUNTER — Ambulatory Visit (INDEPENDENT_AMBULATORY_CARE_PROVIDER_SITE_OTHER): Payer: BLUE CROSS/BLUE SHIELD | Admitting: Cardiology

## 2017-09-12 ENCOUNTER — Encounter: Payer: Self-pay | Admitting: Cardiology

## 2017-09-12 VITALS — BP 120/72 | HR 93 | Ht 67.0 in | Wt 229.8 lb

## 2017-09-12 DIAGNOSIS — I428 Other cardiomyopathies: Secondary | ICD-10-CM | POA: Diagnosis not present

## 2017-09-12 DIAGNOSIS — I10 Essential (primary) hypertension: Secondary | ICD-10-CM

## 2017-09-12 DIAGNOSIS — K51019 Ulcerative (chronic) pancolitis with unspecified complications: Secondary | ICD-10-CM

## 2017-09-12 NOTE — Patient Instructions (Signed)
Medication Instructions:  Your physician recommends that you continue on your current medications as directed. Please refer to the Current Medication list given to you today.  Sample given of entresto  Labwork: None  Testing/Procedures: None  Follow-Up: Your physician recommends that you schedule a follow-up appointment in: 2 months  Any Other Special Instructions Will Be Listed Below (If Applicable).     If you need a refill on your cardiac medications before your next appointment, please call your pharmacy.   [place], RN, BSN

## 2017-09-12 NOTE — Progress Notes (Signed)
Cardiology Office Note:    Date:  09/12/2017   ID:  Meagan Allen, DOB 03-Jun-1956, MRN [id number]  PCP:  Dianna Rossetti, NP  Cardiologist:  Jenne Campus, MD    Referring MD: Dianna Rossetti, NP   Chief Complaint  Patient presents with  . 2 Week Follow-up  Doing well  History of Present Illness:    Meagan Allen is a 62 y.o. female diabetes hypertension abnormal stress test, cardiomyopathy.  She went through cardiac catheterization which showed normal coronaries.  Her ejection fraction appears to be better right now close to 40%.  She seems to be hemodynamically compensated she is asking me about potentially having her shoulder surgery which I do not think will be a problem now.  She is on appropriate medication which I will continue.  Past Medical History:  Diagnosis Date  . Allergy   . Anxiety   . Arthritis   . Cancer ([facility]) 2007, 2009   papillary thyroid CA with mets to cervial lymph nodes  . Colon polyps   . Depression   . Diabetes mellitus   . Fibromyalgia   . GERD (gastroesophageal reflux disease)   . Hearing loss of right ear   . Heart murmur   . Hyperlipidemia   . Hypertension   . IBS (irritable bowel syndrome)   . Migraine   . PONV (postoperative nausea and vomiting)   . Rosacea   . 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 UTI (lower urinary tract infection)    none recent    Past Surgical History:  Procedure Laterality Date  . CARDIAC CATHETERIZATION  02-09-2012   results in epic, no ekg found  . COLONOSCOPY WITH PROPOFOL N/A 05/06/2013   Procedure: COLONOSCOPY WITH PROPOFOL;  Surgeon: Garlan Fair, MD;  Location: [facility];  Service: Endoscopy;  Laterality: N/A;  . DILATION AND CURETTAGE OF UTERUS  1984  . giant cell tumor  2011   resected form right ankle  . JOINT REPLACEMENT     bilateral thumb  . LEFT HEART CATH AND CORONARY ANGIOGRAPHY N/A 08/28/2017   Procedure: LEFT HEART CATH AND CORONARY ANGIOGRAPHY;  Surgeon: Troy Sine, MD;   Location: [facility];  Service: Cardiovascular;  Laterality: N/A;  . left knee surgery     x 2, torn meniscus  . neck resection  08/2007  . THYROIDECTOMY  2007 and 2009    Current Medications: Current Meds  Medication Sig  . aspirin EC 81 MG tablet Take 81 mg by mouth daily.  Marland Kitchen buPROPion (WELLBUTRIN XL) 150 MG 24 hr tablet Take 450 mg by mouth every morning.   . cetirizine (ZYRTEC) 10 MG tablet Take 10 mg by mouth daily.  . clonazePAM (KLONOPIN) 0.5 MG tablet Take 0.5 mg by mouth at bedtime.   Marland Kitchen erythromycin (ERY-TAB) 250 MG EC tablet Take 250 mg by mouth. 1 tablet two times daily and 2 tablets at night  . HYDROcodone-acetaminophen (NORCO/VICODIN) 5-325 MG tablet Take 1 tablet by mouth daily as needed for severe pain.  . Insulin Glargine (LANTUS SOLOSTAR) 100 UNIT/ML Solostar Pen Inject 80 Units into the skin daily at 10 pm.   . levothyroxine (SYNTHROID, LEVOTHROID) 150 MCG tablet Take 150 mcg by mouth at bedtime.   Marland Kitchen linaclotide (LINZESS) 72 MCG capsule Take 72 mcg by mouth daily before breakfast.  . liraglutide (VICTOZA) 18 MG/3ML SOPN Inject 1.2 mg into the skin at bedtime.   . mesalamine (LIALDA) 1.2 g EC tablet Take 2.4 g by  mouth 2 (two) times daily.   . Mesalamine-Cleanser (ROWASA) 4 g KIT Place rectally.  . metFORMIN (GLUCOPHAGE-XR) 500 MG 24 hr tablet Take 1,000 mg by mouth every evening.  . methocarbamol (ROBAXIN) 500 MG tablet Take 500 mg by mouth 2 (two) times daily.   . metoprolol succinate (TOPROL-XL) 100 MG 24 hr tablet Take 1 tablet (100 mg total) by mouth daily. Take with or immediately following a meal.  . Misc Natural Products (COSAMIN ASU ADVANCED FORMULA) CAPS Take 2 capsules by mouth daily.   . pantoprazole (PROTONIX) 40 MG tablet Take 40 mg by mouth daily.  . polyethylene glycol powder (GLYCOLAX/MIRALAX) powder Take 17 g by mouth daily as needed (for constipation.).   Marland Kitchen sacubitril-valsartan (ENTRESTO) 24-26 MG Take 1 tablet by mouth 2 (two) times daily.      Allergies:   Sulfa antibiotics; Bydureon [exenatide]; Invokana [canagliflozin]; Jardiance [empagliflozin]; Lisinopril; Pravastatin; and Propofol   Social History   Socioeconomic History  . Marital status: Married    Spouse name: Jeneen Rinks  . Number of children: 2  . Years of education: 75  . Highest education level: None  Social Needs  . Financial resource strain: None  . Food insecurity - worry: None  . Food insecurity - inability: None  . Transportation needs - medical: None  . Transportation needs - non-medical: None  Occupational History  . Occupation: Banker for [employer]  Tobacco Use  . Smoking status: Former Smoker    Packs/day: 3.00    Years: 10.00    Pack years: 30.00    Types: Cigarettes    Last attempt to quit: 07/17/1980    Years since quitting: 37.1  . Smokeless tobacco: Never Used  Substance and Sexual Activity  . Alcohol use: No    Comment: Rare use  . Drug use: No  . Sexual activity: Yes    Birth control/protection: Post-menopausal  Other Topics Concern  . None  Social History Narrative   Johnnisha was born in [place], [place]. She moved to [place] in 1978 when her family moved to this state. Sanora currently lives in [place] with her husband of 28 years. They have 2 adult children and 1 grandson. 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     Family History: The patient's family history includes Arthritis in her mother; Asthma in her son; Cancer in her brother; Depression in her mother; Diabetes in her father; Gout in her brother, brother, and brother; Heart disease in her father and sister; Hypertension in her father; Stroke in her mother. ROS:   Please see the history of present illness.    All 14 point review of systems negative except as described per history of present illness  EKGs/Labs/Other Studies Reviewed:      Recent Labs: 08/22/2017: BUN 11; Creatinine, Ser  0.77; Potassium 4.6; Sodium 142 08/28/2017: Hemoglobin 11.0; Platelets 270  Recent Lipid Panel No results found for: CHOL, TRIG, HDL, CHOLHDL, VLDL, LDLCALC, LDLDIRECT  Physical Exam:    VS:  BP 120/72   Pulse 93   Ht _0  (1.702 m)   Wt 229 lb 12.8 oz (104.2 kg)   SpO2 93%   BMI 35.99 kg/m     Wt Readings from Last 3 Encounters:  09/12/17 229 lb 12.8 oz (104.2 kg)  08/28/17 225 lb (102.1 kg)  08/22/17 229 lb (103.9 kg)     GEN:  Well nourished, well developed in no  acute distress HEENT: Normal NECK: No JVD; No carotid bruits LYMPHATICS: No lymphadenopathy CARDIAC: RRR, no murmurs, no rubs, no gallops RESPIRATORY:  Clear to auscultation without rales, wheezing or rhonchi  ABDOMEN: Soft, non-tender, non-distended MUSCULOSKELETAL:  No edema; No deformity  SKIN: Warm and dry LOWER EXTREMITIES: no swelling NEUROLOGIC:  Alert and oriented x 3 PSYCHIATRIC:  Normal affect   ASSESSMENT:    1. Nonischemic cardiomyopathy ([facility])   2. Essential hypertension   3. Ulcerative pancolitis with complication ([place])    PLAN:    In order of problems listed above:  1. Nonischemic cardiomyopathy: New York Heart Association class II, ejection fraction 35-40%.  We will continue with beta-blocker and Entresto. 2. Essential hypertension: Doing well from that point review blood pressure appears to be well controlled continue present management. 3. Cirrhotic colitis: Followed by GI. 4. Need for shoulder surgery.  Fine from my standpoint reviewed to proceed with surgery as scheduled however we need to maintain her on her medication around surgical time.  We also need to be extra careful with fluids.   Medication Adjustments/Labs and Tests Ordered: Current medicines are reviewed at length with the patient today.  Concerns regarding medicines are outlined above.  No orders of the defined types were placed in this encounter.  Medication changes: No orders of the defined types were placed in this  encounter.   Signed, Park Liter, MD, [facility] 09/12/2017 4:38 PM    [place]

## 2017-09-21 ENCOUNTER — Ambulatory Visit: Payer: Self-pay | Admitting: Orthopedic Surgery

## 2017-09-26 ENCOUNTER — Other Ambulatory Visit: Payer: Self-pay | Admitting: Gastroenterology

## 2017-09-26 DIAGNOSIS — K51311 Ulcerative (chronic) rectosigmoiditis with rectal bleeding: Secondary | ICD-10-CM

## 2017-10-02 ENCOUNTER — Telehealth: Payer: Self-pay | Admitting: Cardiology

## 2017-10-02 NOTE — Telephone Encounter (Signed)
Patient is concerned that she is no longer on a diuretic. Assured patient I would have Dr. Agustin Cree advise. Patient also wants to make sure that it is okay for her to stop her aspirin on 3/27 for surgery on 4/3. Will have Dr. Agustin Cree advised.

## 2017-10-02 NOTE — Telephone Encounter (Signed)
Patient states that her spouse has the Dx of CHf and he is taking different medicines for it than she is and his heart Dr is wanting to know why she isnt taking Carvedilol and Spiralactone?? She is the [facility] and the Aspirin. He see Dr. Wynonia Lawman??

## 2017-10-03 NOTE — Telephone Encounter (Signed)
Dr. Agustin Cree advised that the best medication management for this patient is entresto and metoprolol. A diuretic will only manage symptoms. Explained this to patient. Patient verbalized understanding. Advised patient to go by the surgeon's instructions as far as taking her aspirin prior to surgery per Dr. Agustin Cree. Patient verbalized understanding. No further questions.

## 2017-10-08 ENCOUNTER — Ambulatory Visit
Admission: RE | Admit: 2017-10-08 | Discharge: 2017-10-08 | Disposition: A | Discharge status: Home / Self Care | Payer: BLUE CROSS/BLUE SHIELD | Source: Ambulatory Visit | Attending: Gastroenterology | Admitting: Gastroenterology

## 2017-10-08 DIAGNOSIS — K51311 Ulcerative (chronic) rectosigmoiditis with rectal bleeding: Secondary | ICD-10-CM

## 2017-10-08 MED ORDER — IOPAMIDOL (ISOVUE-300) INJECTION 61%
125.0000 mL | Freq: Once | INTRAVENOUS | Status: AC | PRN
  Administered 2017-10-08: 125 mL via INTRAVENOUS

## 2017-10-09 NOTE — Patient Instructions (Addendum)
Meagan Allen  10/09/2017   Your procedure is scheduled on:  Wednesday, 04/ 03/ 2019  Report to [facility] Main  Entrance  Report to admitting at   9:30 AM   Call this number if you have problems the morning of surgery [phone]   Remember: Do not eat food or drink liquids :After Midnight.     Take these medicines the morning of surgery with A SIP OF WATER:  Metoprolol, Protonix, Wellbutrin, Zyrtec and Hydrocodone if needed  DO NOT TAKE ANY  DIABETIC MEDICATIONS DAY OF YOUR SURGERY                               You may not have any metal on your body including hair pins and              piercings  Do not wear jewelry, make-up, lotions, powders or perfumes, deodorant             Do not wear nail polish.  Do not shave  48 hours prior to surgery.            Do not bring valuables to the hospital. [place].  Contacts, dentures or bridgework may not be worn into surgery.  Leave suitcase in the car. After surgery it may be brought to your room.     Patients discharged the day of surgery will not be allowed to drive home.  Name and phone number of your driver:  Special Instructions: N/A              Please read over the following fact sheets you were given: _____________________________________________________________________  How to Manage Your Diabetes Before and After Surgery  Why is it important to control my blood sugar before and after surgery? . Improving blood sugar levels before and after surgery helps healing and can limit problems. . A way of improving blood sugar control is eating a healthy diet by: o  Eating less sugar and carbohydrates o  Increasing activity/exercise o  Talking with your doctor about reaching your blood sugar goals . High blood sugars (greater than 180 mg/dL) can raise your risk of infections and slow your recovery, so you will need to focus on controlling your diabetes  during the weeks before surgery. . Make sure that the doctor who takes care of your diabetes knows about your planned surgery including the date and location.  How do I manage my blood sugar before surgery? . Check your blood sugar at least 4 times a day, starting 2 days before surgery, to make sure that the level is not too high or low. o Check your blood sugar the morning of your surgery when you wake up and every 2 hours until you get to the Short Stay unit. . If your blood sugar is less than 70 mg/dL, you will need to treat for low blood sugar: o Do not take insulin. o Treat a low blood sugar (less than 70 mg/dL) with  cup of clear juice (cranberry or apple), 4 glucose tablets, OR glucose gel. o Recheck blood sugar in 15 minutes after treatment (to make sure it is greater than 70 mg/dL). If your blood sugar is not greater than 70 mg/dL on recheck,  call [phone] for further instructions. . Report your blood sugar to the short stay nurse when you get to Short Stay.  . If you are admitted to the hospital after surgery: o Your blood sugar will be checked by the staff and you will probably be given insulin after surgery (instead of oral diabetes medicines) to make sure you have good blood sugar levels. o The goal for blood sugar control after surgery is 80-180 mg/dL.   WHAT DO I DO ABOUT MY DIABETES MEDICATION?  Marland Kitchen Do not take oral diabetes medicines (pills) the morning of surgery.  . THE NIGHT BEFORE SURGERY, take  40   units of Lantus      insulin.       . THE MORNING OF SURGERY, take  40 units of  Lantus       insulin.  . The day of surgery, do not take other diabetes injectables, including Byetta (exenatide), Bydureon (exenatide ER), Victoza (liraglutide), or Trulicity (dulaglutide).  . If your CBG is greater than 220 mg/dL, you may take  of your sliding scale  . (correction) dose of insulin.     Patient Signature:  Date:   Nurse Signature:  Date:   Reviewed and Endorsed  by [facility] Patient Education Committee, August 2015           [facility] - Preparing for Surgery Before surgery, you can play an important role.  Because skin is not sterile, your skin needs to be as free of germs as possible.  You can reduce the number of germs on your skin by washing with CHG (chlorahexidine gluconate) soap before surgery.  CHG is an antiseptic cleaner which kills germs and bonds with the skin to continue killing germs even after washing. Please DO NOT use if you have an allergy to CHG or antibacterial soaps.  If your skin becomes reddened/irritated stop using the CHG and inform your nurse when you arrive at Short Stay. Do not shave (including legs and underarms) for at least 48 hours prior to the first CHG shower.  You may shave your face/neck. Please follow these instructions carefully:  1.  Shower with CHG Soap the night before surgery and the  morning of Surgery.  2.  If you choose to wash your hair, wash your hair first as usual with your  normal  shampoo.  3.  After you shampoo, rinse your hair and body thoroughly to remove the  shampoo.                           4.  Use CHG as you would any other liquid soap.  You can apply chg directly  to the skin and wash                       Gently with a scrungie or clean washcloth.  5.  Apply the CHG Soap to your body ONLY FROM THE NECK DOWN.   Do not use on face/ open                           Wound or open sores. Avoid contact with eyes, ears mouth and genitals (private parts).                       Wash face,  Genitals (private parts) with your normal soap.  6.  Wash thoroughly, paying special attention to the area where your surgery  will be performed.  7.  Thoroughly rinse your body with warm water from the neck down.  8.  DO NOT shower/wash with your normal soap after using and rinsing off  the CHG Soap.                9.  Pat yourself dry with a clean towel.            10.  Wear clean pajamas.            11.   Place clean sheets on your bed the night of your first shower and do not  sleep with pets. Day of Surgery : Do not apply any lotions/deodorants the morning of surgery.  Please wear clean clothes to the hospital/surgery center.  FAILURE TO FOLLOW THESE INSTRUCTIONS MAY RESULT IN THE CANCELLATION OF YOUR SURGERY PATIENT SIGNATURE_________________________________  NURSE SIGNATURE__________________________________  ________________________________________________________________________   Adam Phenix  An incentive spirometer is a tool that can help keep your lungs clear and active. This tool measures how well you are filling your lungs with each breath. Taking long deep breaths may help reverse or decrease the chance of developing breathing (pulmonary) problems (especially infection) following:  A long period of time when you are unable to move or be active. BEFORE THE PROCEDURE   If the spirometer includes an indicator to show your best effort, your nurse or respiratory therapist will set it to a desired goal.  If possible, sit up straight or lean slightly forward. Try not to slouch.  Hold the incentive spirometer in an upright position. INSTRUCTIONS FOR USE  1. Sit on the edge of your bed if possible, or sit up as far as you can in bed or on a chair. 2. Hold the incentive spirometer in an upright position. 3. Breathe out normally. 4. Place the mouthpiece in your mouth and seal your lips tightly around it. 5. Breathe in slowly and as deeply as possible, raising the piston or the ball toward the top of the column. 6. Hold your breath for 3-5 seconds or for as long as possible. Allow the piston or ball to fall to the bottom of the column. 7. Remove the mouthpiece from your mouth and breathe out normally. 8. Rest for a few seconds and repeat Steps 1 through 7 at least 10 times every 1-2 hours when you are awake. Take your time and take a few normal breaths between deep  breaths. 9. The spirometer may include an indicator to show your best effort. Use the indicator as a goal to work toward during each repetition. 10. After each set of 10 deep breaths, practice coughing to be sure your lungs are clear. If you have an incision (the cut made at the time of surgery), support your incision when coughing by placing a pillow or rolled up towels firmly against it. Once you are able to get out of bed, walk around indoors and cough well. You may stop using the incentive spirometer when instructed by your caregiver.  RISKS AND COMPLICATIONS  Take your time so you do not get dizzy or light-headed.  If you are in pain, you may need to take or ask for pain medication before doing incentive spirometry. It is harder to take a deep breath if you are having pain. AFTER USE  Rest and breathe slowly and easily.  It can be helpful to keep track of a log of  your progress. Your caregiver can provide you with a simple table to help with this. If you are using the spirometer at home, follow these instructions: [place] IF:   You are having difficultly using the spirometer.  You have trouble using the spirometer as often as instructed.  Your pain medication is not giving enough relief while using the spirometer.  You develop fever of 100.5 F (38.1 C) or higher. SEEK IMMEDIATE MEDICAL CARE IF:   You cough up bloody sputum that had not been present before.  You develop fever of 102 F (38.9 C) or greater.  You develop worsening pain at or near the incision site. MAKE SURE YOU:   Understand these instructions.  Will watch your condition.  Will get help right away if you are not doing well or get worse. Document Released: 11/13/2006 Document Revised: 09/25/2011 Document Reviewed: 01/14/2007 [facility] Patient Information 2014 [place], [place].   ________________________________________________________________________

## 2017-10-10 ENCOUNTER — Other Ambulatory Visit: Payer: Self-pay

## 2017-10-10 ENCOUNTER — Encounter (HOSPITAL_COMMUNITY)
Admission: RE | Admit: 2017-10-10 | Discharge: 2017-10-10 | Disposition: A | Discharge status: Home / Self Care | Payer: BLUE CROSS/BLUE SHIELD | Source: Ambulatory Visit | Attending: Specialist | Admitting: Specialist

## 2017-10-10 ENCOUNTER — Encounter (HOSPITAL_COMMUNITY): Payer: Self-pay

## 2017-10-10 DIAGNOSIS — Z01812 Encounter for preprocedural laboratory examination: Secondary | ICD-10-CM | POA: Insufficient documentation

## 2017-10-10 DIAGNOSIS — M75102 Unspecified rotator cuff tear or rupture of left shoulder, not specified as traumatic: Secondary | ICD-10-CM | POA: Insufficient documentation

## 2017-10-10 HISTORY — DX: Personal history of colonic polyps: Z86.010

## 2017-10-10 HISTORY — DX: Other cardiomyopathies: I42.8

## 2017-10-10 HISTORY — DX: Heart failure, unspecified: I50.9

## 2017-10-10 HISTORY — DX: Dyspnea, unspecified: R06.00

## 2017-10-10 HISTORY — DX: Postprocedural hypothyroidism: E89.0

## 2017-10-10 HISTORY — DX: Anemia, unspecified: D64.9

## 2017-10-10 HISTORY — DX: Personal history of colon polyps, unspecified: Z86.0100

## 2017-10-10 HISTORY — DX: Unspecified rotator cuff tear or rupture of left shoulder, not specified as traumatic: M75.102

## 2017-10-10 HISTORY — DX: Type 2 diabetes mellitus without complications: E11.9

## 2017-10-10 HISTORY — DX: Personal history of malignant neoplasm of thyroid: Z85.850

## 2017-10-10 LAB — BASIC METABOLIC PANEL
Anion gap: 10 (ref 5–15)
BUN: 10 mg/dL (ref 6–20)
CO2: 25 mmol/L (ref 22–32)
Calcium: 9.8 mg/dL (ref 8.9–10.3)
Chloride: 108 mmol/L (ref 101–111)
Creatinine, Ser: 0.78 mg/dL (ref 0.44–1.00)
GFR calc Af Amer: >60 mL/min (ref >60)
GFR calc non Af Amer: >60 mL/min (ref >60)
Glucose, Bld: 124 mg/dL — ABNORMAL HIGH (ref 65–99)
Potassium: 4 mmol/L (ref 3.5–5.1)
Sodium: 143 mmol/L (ref 135–145)

## 2017-10-10 LAB — CBC
HCT: 29.8 % — ABNORMAL LOW (ref 36.0–46.0)
Hemoglobin: 9.4 g/dL — ABNORMAL LOW (ref 12.0–15.0)
MCH: 25.9 pg — ABNORMAL LOW (ref 26.0–34.0)
MCHC: 31.5 g/dL (ref 30.0–36.0)
MCV: 82.1 fL (ref 78.0–100.0)
Platelets: 387 10*3/uL (ref 150–400)
RBC: 3.63 MIL/uL — ABNORMAL LOW (ref 3.87–5.11)
RDW: 14.3 % (ref 11.5–15.5)
WBC: 5.1 10*3/uL (ref 4.0–10.5)

## 2017-10-10 LAB — GLUCOSE, CAPILLARY: Glucose-Capillary: 130 mg/dL — ABNORMAL HIGH (ref 65–99)

## 2017-10-10 LAB — HEMOGLOBIN A1C
Hgb A1c MFr Bld: 6.8 % — ABNORMAL HIGH (ref 4.8–5.6)
Mean Plasma Glucose: 148.46 mg/dL

## 2017-10-10 NOTE — Progress Notes (Signed)
CBC routed via epic to surgeon Dr. [name]

## 2017-10-10 NOTE — Progress Notes (Addendum)
EKG dated 08-14-2017 in epic. CXR dated 08-22-2017 in epic.  ECHO dated 08-20-2017 in epic.  Stress test dated 08-20-2017 epic. Cardiac clearance dated 09-12-2017 in chart w/ office note by dr Jenne Campus.

## 2017-10-10 NOTE — Progress Notes (Signed)
Pt. Stated at preop  She felt like she had swelling in her fingers and legs. Newly diagnosed CHF 08-2017 I advised her to see her Cardiologist for evaluation and report any weight gain. Pt. Verbalized understanding.

## 2017-10-10 NOTE — Progress Notes (Signed)
Reviewed pt chart w/ anesthesia, dr Conrad [place] mda.  Per dr Conrad [place] ok to proceed.

## 2017-10-12 ENCOUNTER — Ambulatory Visit: Payer: Self-pay | Admitting: Orthopedic Surgery

## 2017-10-12 NOTE — H&P (Signed)
Meagan Allen is an 61 y.o. female.   Chief Complaint: L shoulder pain HPI: Reason for Visit:  Context: fall (DOI 07/21/17)  Location (Upper Extremity): shoulder pain on the left Severity: pain level 2/10  Alleviating Factors: NSAIDS; other medication (Hydrocodone); ice  Are you working? regular duty  Medications: helping a little; The patient is taking Naproxen and Hydrocodone  Past Medical History:  Diagnosis Date  . Anemia   . Anxiety   . Arthritis    osteo  . Cancer (HCC)    thyroid cancer  . CHF (congestive heart failure) (HCC)    diagnosed feb 2019  . Depression   . Dyspnea   . Fibromyalgia   . GERD (gastroesophageal reflux disease)   . Hearing loss of right ear   . Heart murmur   . History of colon polyps   . History of thyroid cancer 2007;  2009   dx papillary thyroid cancer w/ mets to cervical lymph nodes  . Hyperlipidemia   . Hypertension   . IBS (irritable bowel syndrome)   . Left rotator cuff tear   . Migraine   . Nonischemic cardiomyopathy (HCC) cardiologist-  dr krakowski   per cardiac cath 08-28-2017  moderate LV dysfunction w/ diffuse hypocontractility ,  ef 35-40%  . PONV (postoperative nausea and vomiting)    last colonoscopy propfol bp dropped and vomiting  . Post-surgical hypothyroidism   . Rosacea   . Type 2 diabetes mellitus (HCC)    followed by pcp  . 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HCC)     Past Surgical History:  Procedure Laterality Date  . CARDIOVASCULAR STRESS TEST  08-20-2017   dr krasowski   High risk nuclear study w/ large irreverisible defect in the basal and mid inferoseptal, inferior, inferolateral walls and apical septal, inferior walls with small peri infarct ischemia in the apical anterior & lateral walls (consistant w/ prior MI )/  no ST segment deviation noted /  nuclear stress ef 36%/  recommended cardiac cath  . CARPOMETACARPAL ([facility]) FUSION OF THUMB Bilateral 2003  . COLONOSCOPY WITH PROPOFOL N/A 05/06/2013   Procedure:  COLONOSCOPY WITH PROPOFOL;  Surgeon: Martin K Johnson, MD;  Location: [facility];  Service: Endoscopy;  Laterality: N/A;  . D & C HYSTEROSCOPY /  RESECTION POLYP/ ROLLER BALL ABLATION  02-26-2004   dr jarrell  [facility]  . DILATION AND CURETTAGE OF UTERUS  1984  . EXCISION MORTON'S NEUROMA Left 1996  . giant cell tumor  2011   Left ankle 2011  . KNEE ARTHROSCOPY  08-16-2010  dr bednarz  [facility];  05/ 2012   right x2 left x1  . LEFT HEART CATH AND CORONARY ANGIOGRAPHY N/A 08/28/2017   Procedure: LEFT HEART CATH AND CORONARY ANGIOGRAPHY;  Surgeon: Kelly, Thomas A, MD;  [facility];  Service: Cardiovascular;  Laterality: N/A;   large normal coronary arteries in a dominant RCA system;  moderate LV systoic dysfunction w/ diffuse hypocontractility (compatible w/ nonischemic cardiomyopathy), LV end diastoilc pressure normal, LVEF 35-45% by visual estimate  . NASAL LACRIMAL DUCT SURGERY  06/14/2009  . REDO RIGHT MODIFIED RADICAL NECK DISSECTION  08-29-2007    [facility]  . SHOULDER SURGERY     Left mini- open rotator cuff repair Dr. Beane 10-17-17  . TOTAL THYROIDECTOMY  2007  -- [facility]   w/ dissection lymph nodes  . TRANSTHORACIC ECHOCARDIOGRAM  08-20-2017  dr krasowski   ef 25-30%, severe diffuse hypokinesis with no identifiable regional variations, but with profound dyssynchrony, due   to arrhythmia insuffient to evaluate LV diastolic dysfunction/  trivial MR/  mild LAE/ Ventricular septum motion abnormal funtion,dyssynergy, & paradox  . WRIST SURGERY Right 2001    Family History  Problem Relation Age of Onset  . Heart disease Father        MI died age 58  . Hypertension Father   . Diabetes Father   . Arthritis Mother        rheumatoid athritis  . Stroke Mother   . Depression Mother   . Gout Brother   . Cancer Brother        thyroid CA  . Gout Brother   . Gout Brother   . Heart disease Sister        4 months old  . Asthma Son    Social History:  reports that she quit smoking about 37  years ago. Her smoking use included cigarettes. She has a 30.00 pack-year smoking history. She has never used smokeless tobacco. She reports that she does not drink alcohol or use drugs.  Allergies:  Allergies  Allergen Reactions  . Sulfa Antibiotics Hives and Nausea Only  . Bydureon [Exenatide] Other (See Comments)    Yeast issues.  . Invokana [Canagliflozin] Other (See Comments)    Yeast issues.   . Jardiance [Empagliflozin] Other (See Comments)    Yeast issues.   . Lisinopril Other (See Comments)    Pt is unsure of exact reaction  . Pravastatin Other (See Comments)    Body pain.  . Propofol Nausea And Vomiting and Other (See Comments)    Blood pressure bottomed out/syncope    Medications BD Ultra-Fine Mini Pen Needle 31 gauge x 3/16" USE AS DIRECTED ONCE DAILY  buPROPion HCl XL 150 mg 24 hr tablet, extended release TAKE 3 TABLETS BY MOUTH EVERY DAY THREE TIMES A DAY ORALLY 90 DAYS  clonazePAM 0.5 mg tablet TAKE 1/2 TO 1 TABLET BY MOUTH DAILY AT BEDTIME AS NEEDED  erythromycin 250 mg tablet TAKE ONE TABLET BY MOUTH THREE TIMES DAILY  Lantus Solostar U-100 Insulin 100 unit/mL (3 mL) subcutaneous pen INJECT 70 UNITS ONCE DAILY AT BEDTIME SUBCUTANEOUSLY  losartan 25 mg tablet TAKE 1 TABLET BY MOUTH EVERY DAY  mesalamine 1.2 gram tablet,delayed release TAKE 2 TABLETS BY MOUTH DAILY  metFORMIN ER 500 mg tablet,extended release 24 hr TAKE 2 TABLETS WITH EVENING MEAL ONCE A DAY ORALLY 90 DAYS  methocarbamol 500 mg tablet TAKE 1 TABLET BY MOUTH 3 TIMES A DAY  metoprolol tartrate 100 mg tablet TAKE 1 TABLET BY MOUTH TWICE A DAY  Naprosyn 500 mg tablet Take 1 tablet(s) twice a day by oral route with meals.  Norco 5 mg-325 mg tablet Take 1 tablet(s) every 4-6 hours by oral route as needed.  pantoprazole 40 mg tablet,delayed release TAKE 1 TABLET BY MOUTH EVERY MORNING ONCE A DAY ORALLY 90 DAYS  polyethylene glycol 3350 17 gram/dose oral powder  predniSONE 10 mg  tablet  Synthroid 150 mcg tablet  Victoza 2-Pak 0.6 mg/0.1 mL (18 mg/3 mL) subcutaneous pen injector INJECT 0.6MG SUBCUTANEOUSLY ONCE DAILY FOR 1 WEEK, 1.2MG ONCE DAILY THEREAFTER  Results for orders placed or performed during the hospital encounter of 10/10/17 (from the past 48 hour(s))  Glucose, capillary     Status: Abnormal   Collection Time: 10/10/17  2:11 PM  Result Value Ref Range   Glucose-Capillary 130 (H) 65 - 99 mg/dL  Hemoglobin A1c     Status: Abnormal   Collection Time: 10/10/17  2:52   PM  Result Value Ref Range   Hgb A1c MFr Bld 6.8 (H) 4.8 - 5.6 %    Comment: (NOTE) Pre diabetes:          5.7%-6.4% Diabetes:              >6.4% Glycemic control for   <7.0% adults with diabetes    Mean Plasma Glucose 148.46 mg/dL    Comment: Performed at [facility], [address] [place], [place] [address]  Basic metabolic panel     Status: Abnormal   Collection Time: 10/10/17  2:52 PM  Result Value Ref Range   Sodium 143 135 - 145 mmol/L   Potassium 4.0 3.5 - 5.1 mmol/L   Chloride 108 101 - 111 mmol/L   CO2 25 22 - 32 mmol/L   Glucose, Bld 124 (H) 65 - 99 mg/dL   BUN 10 6 - 20 mg/dL   Creatinine, Ser 0.78 0.44 - 1.00 mg/dL   Calcium 9.8 8.9 - 10.3 mg/dL   GFR calc non Af Amer >60 >60 mL/min   GFR calc Af Amer >60 >60 mL/min    Comment: (NOTE) The eGFR has been calculated using the CKD EPI equation. This calculation has not been validated in all clinical situations. eGFR's persistently <60 mL/min signify possible Chronic Kidney Disease.    Anion gap 10 5 - 15    Comment: Performed at [facility], [address] [place], [place] [address]  CBC     Status: Abnormal   Collection Time: 10/10/17  2:52 PM  Result Value Ref Range   WBC 5.1 4.0 - 10.5 K/uL   RBC 3.63 (L) 3.87 - 5.11 MIL/uL   Hemoglobin 9.4 (L) 12.0 - 15.0 g/dL   HCT 29.8 (L) 36.0 - 46.0 %   MCV 82.1 78.0 - 100.0 fL   MCH 25.9 (L) 26.0 - 34.0 pg   MCHC 31.5 30.0 - 36.0 g/dL    RDW 14.3 11.5 - 15.5 %   Platelets 387 150 - 400 K/uL    Comment: Performed at [facility], [address] [place], [place] [address]   No results found.  Review of Systems  Constitutional: Negative.   HENT: Negative.   Eyes: Negative.   Respiratory: Negative.   Cardiovascular: Negative.   Gastrointestinal: Positive for blood in stool.  Genitourinary: Negative.   Musculoskeletal: Positive for joint pain.  Skin: Negative.   Neurological: Negative.     There were no vitals taken for this visit. Physical Exam  Constitutional: She is oriented to person, place, and time. She appears well-developed.  HENT:  Head: Normocephalic.  Eyes: Pupils are equal, round, and reactive to light.  Neck: Normal range of motion.  Cardiovascular: Normal rate.  Respiratory: Effort normal.  GI: Soft.  Musculoskeletal:  Patient is a 61-year-old female.  Patient is unable to abduct be on 30. Weak in external rotation and internal rotation. Tender in the anterior subacromial region. Positive drop arm sign nontender over the AC. Sensory exams intact pulses intact.  Cervical spine normoreflexic sensory exams intact pulses intact nontender of the spinous processes. Right shoulder exam she has a near full range of motion. We can external rotation though but nontender. No Hoffmann sign is noted.  Neurological: She is alert and oriented to person, place, and time.    X-rays reviewed demonstrates a type II acromion and AC arthrosis. Located glenohumeral joint. High riding humerus.  MRI demonstrates a traumatic full-thickness tear of the   supraspinatus and infraspinatus. With retraction. Subscap is intact moderate arthrosis of the AC joint. Moderate hemarthrosis. No atrophy is noted no fatty replacement is noted.  Assessment/Plan Traumatic full-thickness retracted tear of the rotator cuff on the left with a hemarthrosis. No significant pain with the AC joint. No fatty atrophy noted.  Plan  given her pain and lack of range of motion and acuity of the event we discussed mini rotator cuff repair with possible patch graft. Including the possibility of inability to repair with a significant retraction noted. She had no significant symptoms prior and no fatty atrophy to suggest a chronic portion of the rotator cuff. Though it is significantly retracted.  In addition we discussed bleeding infection protracted recovery time in the hospital. She is not allergic to penicillin. She is a diabetic with her A1c about 7. We will currently obtain preoperative clearance and proceed accordingly in the interim continue with range of motion sling as tolerated analgesics as needed.   We spent considerable time reviewing her x-rays the trauma her exam range of motion and the discussion concerning the operation and postoperative course. She has had a tear of the rotator cuff in the past which she has recovered from satisfactorily without repair.  Certainly she continues attempted range of motion strengthening at home if her pain resolves and she improves her motion would consider that as well.  Plan L shoulder mini open RCR, SAD, possible patch graft  Lindsi Bayliss M., PA-C for Dr. Beane 10/12/2017, 11:22 AM   

## 2017-10-12 NOTE — H&P (View-Only) (Signed)
Meagan Allen is an 62 y.o. female.   Chief Complaint: L shoulder pain HPI: Reason for Visit:  Context: fall (DOI 07/21/17)  Location (Upper Extremity): shoulder pain on the left Severity: pain level 2/10  Alleviating Factors: NSAIDS; other medication (Hydrocodone); ice  Are you working? regular duty  Medications: helping a little; The patient is taking Naproxen and Hydrocodone  Past Medical History:  Diagnosis Date  . Anemia   . Anxiety   . Arthritis    osteo  . Cancer [facility])    thyroid cancer  . CHF (congestive heart failure) ([place])    diagnosed feb 2019  . Depression   . Dyspnea   . Fibromyalgia   . GERD (gastroesophageal reflux disease)   . Hearing loss of right ear   . Heart murmur   . History of colon polyps   . History of thyroid cancer 2007;  2009   dx papillary thyroid cancer w/ mets to cervical lymph nodes  . Hyperlipidemia   . Hypertension   . IBS (irritable bowel syndrome)   . Left rotator cuff tear   . Migraine   . Nonischemic cardiomyopathy [facility]) cardiologist-  dr Edyth Gunnels   per cardiac cath 08-28-2017  moderate LV dysfunction w/ diffuse hypocontractility ,  ef 35-40%  . PONV (postoperative nausea and vomiting)    last colonoscopy propfol bp dropped and vomiting  . Post-surgical hypothyroidism   . Rosacea   . Type 2 diabetes mellitus ([place])    followed by pcp  . 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[place])     Past Surgical History:  Procedure Laterality Date  . CARDIOVASCULAR STRESS TEST  08-20-2017   dr Agustin Cree   High risk nuclear study w/ large irreverisible defect in the basal and mid inferoseptal, inferior, inferolateral walls and apical septal, inferior walls with small peri infarct ischemia in the apical anterior & lateral walls (consistant w/ prior MI )/  no ST segment deviation noted /  nuclear stress ef 36%/  recommended cardiac cath  . CARPOMETACARPAL ([place]) FUSION OF THUMB Bilateral 2003  . COLONOSCOPY WITH PROPOFOL N/A 05/06/2013   Procedure:  COLONOSCOPY WITH PROPOFOL;  Surgeon: Garlan Fair, MD;  Location: [facility];  Service: Endoscopy;  Laterality: N/A;  . D & C HYSTEROSCOPY /  RESECTION POLYP/ ROLLER BALL ABLATION  02-26-2004   dr Kathyrn Drown  [facility]  . DILATION AND CURETTAGE OF UTERUS  1984  . EXCISION MORTON'S NEUROMA Left 1996  . giant cell tumor  2011   Left ankle 2011  . KNEE ARTHROSCOPY  08-16-2010  dr Beola Cord  [facility];  05/ 2012   right x2 left x1  . LEFT HEART CATH AND CORONARY ANGIOGRAPHY N/A 08/28/2017   Procedure: LEFT HEART CATH AND CORONARY ANGIOGRAPHY;  Surgeon: Troy Sine, MD;  Location: [place] [facility];  Service: Cardiovascular;  Laterality: N/A;   large normal coronary arteries in a dominant RCA system;  moderate LV systoic dysfunction w/ diffuse hypocontractility (compatible w/ nonischemic cardiomyopathy), LV end diastoilc pressure normal, LVEF 35-45% by visual estimate  . NASAL LACRIMAL DUCT SURGERY  06/14/2009  . REDO RIGHT MODIFIED RADICAL NECK DISSECTION  08-29-2007    [facility]  . SHOULDER SURGERY     Left mini- open rotator cuff repair Dr. Tonita Cong 10-17-17  . TOTAL THYROIDECTOMY  2007  -- [facility]   w/ dissection lymph nodes  . TRANSTHORACIC ECHOCARDIOGRAM  08-20-2017  dr Agustin Cree   ef 25-30%, severe diffuse hypokinesis with no identifiable regional variations, but with profound dyssynchrony, due  to arrhythmia insuffient to evaluate LV diastolic dysfunction/  trivial MR/  mild LAE/ Ventricular septum motion abnormal funtion,dyssynergy, & paradox  . WRIST SURGERY Right 2001    Family History  Problem Relation Age of Onset  . Heart disease Father        MI died age 45  . Hypertension Father   . Diabetes Father   . Arthritis Mother        rheumatoid athritis  . Stroke Mother   . Depression Mother   . Gout Brother   . Cancer Brother        thyroid CA  . Gout Brother   . Gout Brother   . Heart disease Sister        50 months old  . Asthma Son    Social History:  reports that she quit smoking about 37  years ago. Her smoking use included cigarettes. She has a 30.00 pack-year smoking history. She has never used smokeless tobacco. She reports that she does not drink alcohol or use drugs.  Allergies:  Allergies  Allergen Reactions  . Sulfa Antibiotics Hives and Nausea Only  . Bydureon [Exenatide] Other (See Comments)    Yeast issues.  Anastasio Auerbach [Canagliflozin] Other (See Comments)    Yeast issues.   Vania Rea [Empagliflozin] Other (See Comments)    Yeast issues.   . Lisinopril Other (See Comments)    Pt is unsure of exact reaction  . Pravastatin Other (See Comments)    Body pain.  Marland Kitchen Propofol Nausea And Vomiting and Other (See Comments)    Blood pressure bottomed out/syncope    Medications BD Ultra-Fine Mini Pen Needle 31 gauge x 3/16" USE AS DIRECTED ONCE DAILY  buPROPion HCl XL 150 mg 24 hr tablet, extended release TAKE 3 TABLETS BY MOUTH EVERY DAY THREE TIMES A DAY ORALLY 90 DAYS  clonazePAM 0.5 mg tablet TAKE 1/2 TO 1 TABLET BY MOUTH DAILY AT BEDTIME AS NEEDED  erythromycin 250 mg tablet TAKE ONE TABLET BY MOUTH THREE TIMES DAILY  Lantus Solostar U-100 Insulin 100 unit/mL (3 mL) subcutaneous pen INJECT 70 UNITS ONCE DAILY AT BEDTIME SUBCUTANEOUSLY  losartan 25 mg tablet TAKE 1 TABLET BY MOUTH EVERY DAY  mesalamine 1.2 gram tablet,delayed release TAKE 2 TABLETS BY MOUTH DAILY  metFORMIN ER 500 mg tablet,extended release 24 hr TAKE 2 TABLETS WITH EVENING MEAL ONCE A DAY ORALLY 90 DAYS  methocarbamol 500 mg tablet TAKE 1 TABLET BY MOUTH 3 TIMES A DAY  metoprolol tartrate 100 mg tablet TAKE 1 TABLET BY MOUTH TWICE A DAY  Naprosyn 500 mg tablet Take 1 tablet(s) twice a day by oral route with meals.  Norco 5 mg-325 mg tablet Take 1 tablet(s) every 4-6 hours by oral route as needed.  pantoprazole 40 mg tablet,delayed release TAKE 1 TABLET BY MOUTH EVERY MORNING ONCE A DAY ORALLY 90 DAYS  polyethylene glycol 3350 17 gram/dose oral powder  predniSONE 10 mg  tablet  Synthroid 150 mcg tablet  Victoza 2-Pak 0.6 mg/0.1 mL (18 mg/3 mL) subcutaneous pen injector INJECT 0.6MG SUBCUTANEOUSLY ONCE DAILY FOR 1 WEEK, 1.2MG ONCE DAILY THEREAFTER  Results for orders placed or performed during the hospital encounter of 10/10/17 (from the past 48 hour(s))  Glucose, capillary     Status: Abnormal   Collection Time: 10/10/17  2:11 PM  Result Value Ref Range   Glucose-Capillary 130 (H) 65 - 99 mg/dL  Hemoglobin A1c     Status: Abnormal   Collection Time: 10/10/17  2:52  PM  Result Value Ref Range   Hgb A1c MFr Bld 6.8 (H) 4.8 - 5.6 %    Comment: (NOTE) Pre diabetes:          5.7%-6.4% Diabetes:              >6.4% Glycemic control for   <7.0% adults with diabetes    Mean Plasma Glucose 148.46 mg/dL    Comment: Performed at [facility] [address] [place], [place] [address]  Basic metabolic panel     Status: Abnormal   Collection Time: 10/10/17  2:52 PM  Result Value Ref Range   Sodium 143 135 - 145 mmol/L   Potassium 4.0 3.5 - 5.1 mmol/L   Chloride 108 101 - 111 mmol/L   CO2 25 22 - 32 mmol/L   Glucose, Bld 124 (H) 65 - 99 mg/dL   BUN 10 6 - 20 mg/dL   Creatinine, Ser 0.78 0.44 - 1.00 mg/dL   Calcium 9.8 8.9 - 10.3 mg/dL   GFR calc non Af Amer >60 >60 mL/min   GFR calc Af Amer >60 >60 mL/min    Comment: (NOTE) The eGFR has been calculated using the CKD EPI equation. This calculation has not been validated in all clinical situations. eGFR's persistently <60 mL/min signify possible Chronic Kidney Disease.    Anion gap 10 5 - 15    Comment: Performed at [facility], [place] [address] [place], [place] [address]  CBC     Status: Abnormal   Collection Time: 10/10/17  2:52 PM  Result Value Ref Range   WBC 5.1 4.0 - 10.5 K/uL   RBC 3.63 (L) 3.87 - 5.11 MIL/uL   Hemoglobin 9.4 (L) 12.0 - 15.0 g/dL   HCT 29.8 (L) 36.0 - 46.0 %   MCV 82.1 78.0 - 100.0 fL   MCH 25.9 (L) 26.0 - 34.0 pg   MCHC 31.5 30.0 - 36.0 g/dL    RDW 14.3 11.5 - 15.5 %   Platelets 387 150 - 400 K/uL    Comment: Performed at [facility], [place] [address], [place], [place] [address]   No results found.  Review of Systems  Constitutional: Negative.   HENT: Negative.   Eyes: Negative.   Respiratory: Negative.   Cardiovascular: Negative.   Gastrointestinal: Positive for blood in stool.  Genitourinary: Negative.   Musculoskeletal: Positive for joint pain.  Skin: Negative.   Neurological: Negative.     There were no vitals taken for this visit. Physical Exam  Constitutional: She is oriented to person, place, and time. She appears well-developed.  HENT:  Head: Normocephalic.  Eyes: Pupils are equal, round, and reactive to light.  Neck: Normal range of motion.  Cardiovascular: Normal rate.  Respiratory: Effort normal.  GI: Soft.  Musculoskeletal:  Patient is a 62 year old female.  Patient is unable to abduct be on 30. Weak in external rotation and internal rotation. Tender in the anterior subacromial region. Positive drop arm sign nontender over the [facility]. Sensory exams intact pulses intact.  Cervical spine normoreflexic sensory exams intact pulses intact nontender of the spinous processes. Right shoulder exam she has a near full range of motion. We can external rotation though but nontender. No Hoffmann sign is noted.  Neurological: She is alert and oriented to person, place, and time.    X-rays reviewed demonstrates a type II acromion and AC arthrosis. Located glenohumeral joint. High riding humerus.  MRI demonstrates a traumatic full-thickness tear of the  supraspinatus and infraspinatus. With retraction. Subscap is intact moderate arthrosis of the AC joint. Moderate hemarthrosis. No atrophy is noted no fatty replacement is noted.  Assessment/Plan Traumatic full-thickness retracted tear of the rotator cuff on the left with a hemarthrosis. No significant pain with the [facility] joint. No fatty atrophy noted.  Plan  given her pain and lack of range of motion and acuity of the event we discussed mini rotator cuff repair with possible patch graft. Including the possibility of inability to repair with a significant retraction noted. She had no significant symptoms prior and no fatty atrophy to suggest a chronic portion of the rotator cuff. Though it is significantly retracted.  In addition we discussed bleeding infection protracted recovery time in the hospital. She is not allergic to penicillin. She is a diabetic with her A1c about 7. We will currently obtain preoperative clearance and proceed accordingly in the interim continue with range of motion sling as tolerated analgesics as needed.   We spent considerable time reviewing her x-rays the trauma her exam range of motion and the discussion concerning the operation and postoperative course. She has had a tear of the rotator cuff in the past which she has recovered from satisfactorily without repair.  Certainly she continues attempted range of motion strengthening at home if her pain resolves and she improves her motion would consider that as well.  Plan L shoulder mini open RCR, SAD, possible patch graft  Cecilie Kicks., PA-C for Dr. Tonita Cong 10/12/2017, 11:22 AM

## 2017-10-16 NOTE — Anesthesia Preprocedure Evaluation (Addendum)
Anesthesia Evaluation  Patient identified by MRN, date of birth, ID band Patient awake    Reviewed: Allergy & Precautions, H&P , Patient's Chart, lab work & pertinent test results, reviewed documented beta blocker date and time   Airway Mallampati: II  TM Distance: >3 FB Neck ROM: full    Dental no notable dental hx.    Pulmonary neg pulmonary ROS, shortness of breath, former smoker,    Pulmonary exam normal breath sounds clear to auscultation       Cardiovascular Exercise Tolerance: Good hypertension, +CHF  negative cardio ROS   Rhythm:regular Rate:Normal  Lexi  scan 2/17 - This is a high risk study, LVEF is severely decreased at 36%. There is a large irreversible defect in the basal and mid inferoseptal, inferior, inferolateral walls and apical septal, inferior walls with small peri infarct ischemia in the apical anterior and lateral walls   Neuro/Psych Anxiety    GI/Hepatic negative GI ROS, Neg liver ROS, GERD  ,  Endo/Other  diabetesMorbid obesity  Renal/GU negative Renal ROS     Musculoskeletal   Abdominal   Peds  Hematology  (+) anemia ,   Anesthesia Other Findings The left ventricular ejection fraction is 35-45% by visual estimate.    Reproductive/Obstetrics                           Lab Results  Component Value Date   CREATININE 0.78 10/10/2017   BUN 10 10/10/2017   NA 143 10/10/2017   K 4.0 10/10/2017   CL 108 10/10/2017   CO2 25 10/10/2017    Lab Results  Component Value Date   WBC 5.1 10/10/2017   HGB 9.4 (L) 10/10/2017   HCT 29.8 (L) 10/10/2017   MCV 82.1 10/10/2017   PLT 387 10/10/2017    Anesthesia Physical Anesthesia Plan  ASA: IV  Anesthesia Plan: Regional and General   Post-op Pain Management: GA combined w/ Regional for post-op pain   Induction: Intravenous  PONV Risk Score and Plan: Treatment may vary due to age or medical condition, Ondansetron and  Dexamethasone  Airway Management Planned: Oral ETT  Additional Equipment:   Intra-op Plan:   Post-operative Plan: Extubation in OR  Informed Consent: I have reviewed the patients History and Physical, chart, labs and discussed the procedure including the risks, benefits and alternatives for the proposed anesthesia with the patient or authorized representative who has indicated his/her understanding and acceptance.   Dental advisory given  Plan Discussed with: CRNA and Surgeon  Anesthesia Plan Comments: (  )      Anesthesia Quick Evaluation

## 2017-10-17 ENCOUNTER — Ambulatory Visit (HOSPITAL_COMMUNITY): Payer: BLUE CROSS/BLUE SHIELD | Admitting: Anesthesiology

## 2017-10-17 ENCOUNTER — Encounter (HOSPITAL_COMMUNITY)
Admission: RE | Disposition: A | Discharge status: Home / Self Care | Payer: Self-pay | Source: Other Acute Inpatient Hospital | Attending: Specialist

## 2017-10-17 ENCOUNTER — Encounter (HOSPITAL_COMMUNITY): Payer: Self-pay | Admitting: *Deleted

## 2017-10-17 ENCOUNTER — Other Ambulatory Visit: Payer: Self-pay

## 2017-10-17 ENCOUNTER — Ambulatory Visit (HOSPITAL_COMMUNITY)
Admission: RE | Admit: 2017-10-17 | Discharge: 2017-10-18 | Disposition: A | Discharge status: Home / Self Care | Payer: BLUE CROSS/BLUE SHIELD | Source: Other Acute Inpatient Hospital | Attending: Specialist | Admitting: Specialist

## 2017-10-17 DIAGNOSIS — M75122 Complete rotator cuff tear or rupture of left shoulder, not specified as traumatic: Secondary | ICD-10-CM | POA: Diagnosis not present

## 2017-10-17 DIAGNOSIS — I509 Heart failure, unspecified: Secondary | ICD-10-CM | POA: Insufficient documentation

## 2017-10-17 DIAGNOSIS — I110 Hypertensive heart disease with heart failure: Secondary | ICD-10-CM | POA: Insufficient documentation

## 2017-10-17 DIAGNOSIS — M797 Fibromyalgia: Secondary | ICD-10-CM | POA: Insufficient documentation

## 2017-10-17 DIAGNOSIS — K219 Gastro-esophageal reflux disease without esophagitis: Secondary | ICD-10-CM | POA: Insufficient documentation

## 2017-10-17 DIAGNOSIS — Z87891 Personal history of nicotine dependence: Secondary | ICD-10-CM | POA: Diagnosis not present

## 2017-10-17 DIAGNOSIS — Z794 Long term (current) use of insulin: Secondary | ICD-10-CM | POA: Insufficient documentation

## 2017-10-17 DIAGNOSIS — K589 Irritable bowel syndrome without diarrhea: Secondary | ICD-10-CM | POA: Insufficient documentation

## 2017-10-17 DIAGNOSIS — Z6835 Body mass index (BMI) 35.0-35.9, adult: Secondary | ICD-10-CM | POA: Diagnosis not present

## 2017-10-17 DIAGNOSIS — R011 Cardiac murmur, unspecified: Secondary | ICD-10-CM | POA: Insufficient documentation

## 2017-10-17 DIAGNOSIS — Z882 Allergy status to sulfonamides status: Secondary | ICD-10-CM | POA: Diagnosis not present

## 2017-10-17 DIAGNOSIS — Z85850 Personal history of malignant neoplasm of thyroid: Secondary | ICD-10-CM | POA: Diagnosis not present

## 2017-10-17 DIAGNOSIS — Z888 Allergy status to other drugs, medicaments and biological substances status: Secondary | ICD-10-CM | POA: Insufficient documentation

## 2017-10-17 DIAGNOSIS — F419 Anxiety disorder, unspecified: Secondary | ICD-10-CM | POA: Insufficient documentation

## 2017-10-17 DIAGNOSIS — M1990 Unspecified osteoarthritis, unspecified site: Secondary | ICD-10-CM | POA: Insufficient documentation

## 2017-10-17 DIAGNOSIS — Z8249 Family history of ischemic heart disease and other diseases of the circulatory system: Secondary | ICD-10-CM | POA: Diagnosis not present

## 2017-10-17 DIAGNOSIS — E119 Type 2 diabetes mellitus without complications: Secondary | ICD-10-CM | POA: Diagnosis not present

## 2017-10-17 DIAGNOSIS — F329 Major depressive disorder, single episode, unspecified: Secondary | ICD-10-CM | POA: Insufficient documentation

## 2017-10-17 DIAGNOSIS — M7542 Impingement syndrome of left shoulder: Secondary | ICD-10-CM | POA: Diagnosis not present

## 2017-10-17 DIAGNOSIS — Z884 Allergy status to anesthetic agent status: Secondary | ICD-10-CM | POA: Diagnosis not present

## 2017-10-17 DIAGNOSIS — I429 Cardiomyopathy, unspecified: Secondary | ICD-10-CM | POA: Diagnosis not present

## 2017-10-17 DIAGNOSIS — Z79899 Other long term (current) drug therapy: Secondary | ICD-10-CM | POA: Insufficient documentation

## 2017-10-17 DIAGNOSIS — Z7982 Long term (current) use of aspirin: Secondary | ICD-10-CM | POA: Insufficient documentation

## 2017-10-17 DIAGNOSIS — M75102 Unspecified rotator cuff tear or rupture of left shoulder, not specified as traumatic: Secondary | ICD-10-CM

## 2017-10-17 DIAGNOSIS — E785 Hyperlipidemia, unspecified: Secondary | ICD-10-CM | POA: Insufficient documentation

## 2017-10-17 DIAGNOSIS — M75120 Complete rotator cuff tear or rupture of unspecified shoulder, not specified as traumatic: Secondary | ICD-10-CM | POA: Diagnosis present

## 2017-10-17 DIAGNOSIS — E890 Postprocedural hypothyroidism: Secondary | ICD-10-CM | POA: Diagnosis not present

## 2017-10-17 DIAGNOSIS — Z86010 Personal history of colonic polyps: Secondary | ICD-10-CM | POA: Insufficient documentation

## 2017-10-17 HISTORY — PX: SHOULDER ARTHROSCOPY WITH ROTATOR CUFF REPAIR AND SUBACROMIAL DECOMPRESSION: SHX5686

## 2017-10-17 LAB — CBC WITH DIFFERENTIAL/PLATELET
Basophils Absolute: 0 10*3/uL (ref 0.0–0.1)
Basophils Relative: 0 %
Eosinophils Absolute: 0.1 10*3/uL (ref 0.0–0.7)
Eosinophils Relative: 1 %
HCT: 28.2 % — ABNORMAL LOW (ref 36.0–46.0)
Hemoglobin: 8.9 g/dL — ABNORMAL LOW (ref 12.0–15.0)
Lymphocytes Relative: 41 %
Lymphs Abs: 2.2 10*3/uL (ref 0.7–4.0)
MCH: 25.5 pg — ABNORMAL LOW (ref 26.0–34.0)
MCHC: 31.6 g/dL (ref 30.0–36.0)
MCV: 80.8 fL (ref 78.0–100.0)
Monocytes Absolute: 0.4 10*3/uL (ref 0.1–1.0)
Monocytes Relative: 7 %
Neutro Abs: 2.7 10*3/uL (ref 1.7–7.7)
Neutrophils Relative %: 51 %
Platelets: 383 10*3/uL (ref 150–400)
RBC: 3.49 MIL/uL — ABNORMAL LOW (ref 3.87–5.11)
RDW: 14.4 % (ref 11.5–15.5)
WBC: 5.4 10*3/uL (ref 4.0–10.5)

## 2017-10-17 LAB — GLUCOSE, CAPILLARY
Glucose-Capillary: 115 mg/dL — ABNORMAL HIGH (ref 65–99)
Glucose-Capillary: 222 mg/dL — ABNORMAL HIGH (ref 65–99)
Glucose-Capillary: 193 mg/dL — ABNORMAL HIGH (ref 65–99)

## 2017-10-17 LAB — ABO/RH: ABO/RH(D): O NEG

## 2017-10-17 LAB — HEMOGLOBIN AND HEMATOCRIT, BLOOD
HCT: 28.6 % — ABNORMAL LOW (ref 36.0–46.0)
Hemoglobin: 8.9 g/dL — ABNORMAL LOW (ref 12.0–15.0)

## 2017-10-17 LAB — PREPARE RBC (CROSSMATCH)

## 2017-10-17 SURGERY — SHOULDER ARTHROSCOPY WITH ROTATOR CUFF REPAIR AND SUBACROMIAL DECOMPRESSION
Anesthesia: Regional | Site: Shoulder | Laterality: Left

## 2017-10-17 MED ORDER — LIRAGLUTIDE 18 MG/3ML ~~LOC~~ SOPN: 1.2000 mg | PEN_INJECTOR | Freq: Every day | SUBCUTANEOUS | Status: DC

## 2017-10-17 MED ORDER — DOCUSATE SODIUM 100 MG PO CAPS
100.0000 mg | ORAL_CAPSULE | Freq: Two times a day (BID) | ORAL | Status: DC
  Filled 2017-10-17: qty 1

## 2017-10-17 MED ORDER — ONDANSETRON HCL 4 MG/2ML IJ SOLN
INTRAMUSCULAR | Status: DC | PRN
  Administered 2017-10-17: 4 mg via INTRAVENOUS

## 2017-10-17 MED ORDER — ONDANSETRON HCL 4 MG/2ML IJ SOLN: 4.0000 mg | Freq: Four times a day (QID) | INTRAMUSCULAR | Status: DC | PRN

## 2017-10-17 MED ORDER — PHENYLEPHRINE 40 MCG/ML (10ML) SYRINGE FOR IV PUSH (FOR BLOOD PRESSURE SUPPORT)
PREFILLED_SYRINGE | INTRAVENOUS | Status: DC | PRN
  Administered 2017-10-17: 80 ug via INTRAVENOUS
  Administered 2017-10-17: 120 ug via INTRAVENOUS
  Administered 2017-10-17: 80 ug via INTRAVENOUS

## 2017-10-17 MED ORDER — POLYETHYLENE GLYCOL 3350 17 G PO PACK: 17.0000 g | PACK | Freq: Every day | ORAL | Status: DC | PRN

## 2017-10-17 MED ORDER — MAGNESIUM CITRATE PO SOLN: 1.0000 | Freq: Once | ORAL | Status: DC | PRN

## 2017-10-17 MED ORDER — MENTHOL 3 MG MT LOZG: 1.0000 | LOZENGE | OROMUCOSAL | Status: DC | PRN

## 2017-10-17 MED ORDER — ONDANSETRON HCL 4 MG PO TABS: 4.0000 mg | ORAL_TABLET | Freq: Four times a day (QID) | ORAL | Status: DC | PRN

## 2017-10-17 MED ORDER — HYDROMORPHONE HCL 1 MG/ML IJ SOLN
INTRAMUSCULAR | Status: AC
  Filled 2017-10-17: qty 1

## 2017-10-17 MED ORDER — RISAQUAD PO CAPS
1.0000 | ORAL_CAPSULE | Freq: Every day | ORAL | Status: DC
  Administered 2017-10-18: 1 via ORAL
  Filled 2017-10-17: qty 1

## 2017-10-17 MED ORDER — LIDOCAINE HCL (CARDIAC) 20 MG/ML IV SOLN
INTRAVENOUS | Status: DC | PRN
  Administered 2017-10-17: 60 mg via INTRAVENOUS

## 2017-10-17 MED ORDER — METOCLOPRAMIDE HCL 5 MG/ML IJ SOLN: 5.0000 mg | Freq: Three times a day (TID) | INTRAMUSCULAR | Status: DC | PRN

## 2017-10-17 MED ORDER — LORATADINE 10 MG PO TABS
10.0000 mg | ORAL_TABLET | Freq: Every day | ORAL | Status: DC
  Administered 2017-10-18: 10 mg via ORAL
  Filled 2017-10-17: qty 1

## 2017-10-17 MED ORDER — POTASSIUM CHLORIDE IN NACL 20-0.45 MEQ/L-% IV SOLN
INTRAVENOUS | Status: DC
  Filled 2017-10-17: qty 1000

## 2017-10-17 MED ORDER — CHLORHEXIDINE GLUCONATE 4 % EX LIQD: 60.0000 mL | Freq: Once | CUTANEOUS | Status: DC

## 2017-10-17 MED ORDER — BUPIVACAINE-EPINEPHRINE 0.5% -1:200000 IJ SOLN
INTRAMUSCULAR | Status: DC | PRN
  Administered 2017-10-17: 5 mL

## 2017-10-17 MED ORDER — ACETAMINOPHEN 10 MG/ML IV SOLN
INTRAVENOUS | Status: AC
  Filled 2017-10-17: qty 100

## 2017-10-17 MED ORDER — PROPOFOL 10 MG/ML IV BOLUS
INTRAVENOUS | Status: DC | PRN
  Administered 2017-10-17 (×2): 30 mg via INTRAVENOUS
  Administered 2017-10-17: 100 mg via INTRAVENOUS

## 2017-10-17 MED ORDER — SACUBITRIL-VALSARTAN 24-26 MG PO TABS
1.0000 | ORAL_TABLET | Freq: Two times a day (BID) | ORAL | Status: DC
  Administered 2017-10-18: 1 via ORAL
  Filled 2017-10-17: qty 1

## 2017-10-17 MED ORDER — LACTATED RINGERS IV SOLN: INTRAVENOUS | Status: DC

## 2017-10-17 MED ORDER — POLYMYXIN B SULFATE 500000 UNITS IJ SOLR
INTRAMUSCULAR | Status: AC
  Filled 2017-10-17: qty 500000

## 2017-10-17 MED ORDER — ROCURONIUM BROMIDE 10 MG/ML (PF) SYRINGE
PREFILLED_SYRINGE | INTRAVENOUS | Status: AC
Start: 2017-10-17 — End: 2017-10-17
  Filled 2017-10-17: qty 5

## 2017-10-17 MED ORDER — PHENOL 1.4 % MT LIQD
1.0000 | OROMUCOSAL | Status: DC | PRN
  Filled 2017-10-17: qty 177

## 2017-10-17 MED ORDER — PHENYLEPHRINE HCL 10 MG/ML IJ SOLN
INTRAVENOUS | Status: DC | PRN
  Administered 2017-10-17: 50 ug/min via INTRAVENOUS

## 2017-10-17 MED ORDER — DEXAMETHASONE SODIUM PHOSPHATE 10 MG/ML IJ SOLN
INTRAMUSCULAR | Status: AC
  Filled 2017-10-17: qty 1

## 2017-10-17 MED ORDER — ACETAMINOPHEN 10 MG/ML IV SOLN
1000.0000 mg | Freq: Once | INTRAVENOUS | Status: DC | PRN
  Administered 2017-10-17: 1000 mg via INTRAVENOUS

## 2017-10-17 MED ORDER — PHENYLEPHRINE HCL 10 MG/ML IJ SOLN
INTRAMUSCULAR | Status: AC
  Filled 2017-10-17: qty 1

## 2017-10-17 MED ORDER — PHENYLEPHRINE 40 MCG/ML (10ML) SYRINGE FOR IV PUSH (FOR BLOOD PRESSURE SUPPORT)
PREFILLED_SYRINGE | INTRAVENOUS | Status: AC
  Filled 2017-10-17: qty 10

## 2017-10-17 MED ORDER — DIPHENHYDRAMINE HCL 12.5 MG/5ML PO ELIX: 12.5000 mg | ORAL_SOLUTION | ORAL | Status: DC | PRN

## 2017-10-17 MED ORDER — LINACLOTIDE 145 MCG PO CAPS
290.0000 ug | ORAL_CAPSULE | Freq: Every day | ORAL | Status: DC
  Filled 2017-10-17: qty 2

## 2017-10-17 MED ORDER — PROPOFOL 10 MG/ML IV BOLUS
INTRAVENOUS | Status: AC
  Filled 2017-10-17: qty 20

## 2017-10-17 MED ORDER — SODIUM CHLORIDE 0.9 % IV SOLN
Freq: Once | INTRAVENOUS | Status: AC
  Administered 2017-10-17: 19:00:00 via INTRAVENOUS

## 2017-10-17 MED ORDER — FUROSEMIDE 10 MG/ML IJ SOLN
20.0000 mg | Freq: Once | INTRAMUSCULAR | Status: AC
  Administered 2017-10-17: 20 mg via INTRAVENOUS
  Filled 2017-10-17: qty 2

## 2017-10-17 MED ORDER — FENTANYL CITRATE (PF) 100 MCG/2ML IJ SOLN
INTRAMUSCULAR | Status: AC
  Filled 2017-10-17: qty 2

## 2017-10-17 MED ORDER — MEPERIDINE HCL 50 MG/ML IJ SOLN: 6.2500 mg | INTRAMUSCULAR | Status: DC | PRN

## 2017-10-17 MED ORDER — PROMETHAZINE HCL 25 MG/ML IJ SOLN: 6.2500 mg | INTRAMUSCULAR | Status: DC | PRN

## 2017-10-17 MED ORDER — BUPROPION HCL ER (XL) 150 MG PO TB24
450.0000 mg | ORAL_TABLET | Freq: Every morning | ORAL | Status: DC
  Administered 2017-10-18: 450 mg via ORAL
  Filled 2017-10-17: qty 1

## 2017-10-17 MED ORDER — ROCURONIUM BROMIDE 100 MG/10ML IV SOLN
INTRAVENOUS | Status: DC | PRN
  Administered 2017-10-17: 60 mg via INTRAVENOUS

## 2017-10-17 MED ORDER — LIDOCAINE 2% (20 MG/ML) 5 ML SYRINGE
INTRAMUSCULAR | Status: AC
  Filled 2017-10-17: qty 5

## 2017-10-17 MED ORDER — MIDAZOLAM HCL 2 MG/2ML IJ SOLN
1.0000 mg | INTRAMUSCULAR | Status: DC
  Filled 2017-10-17: qty 2

## 2017-10-17 MED ORDER — BUPIVACAINE-EPINEPHRINE (PF) 0.5% -1:200000 IJ SOLN
INTRAMUSCULAR | Status: DC | PRN
  Administered 2017-10-17: 23 mL via PERINEURAL

## 2017-10-17 MED ORDER — BISACODYL 5 MG PO TBEC: 5.0000 mg | DELAYED_RELEASE_TABLET | Freq: Every day | ORAL | Status: DC | PRN

## 2017-10-17 MED ORDER — SODIUM CHLORIDE 0.9 % IV SOLN
INTRAVENOUS | Status: DC | PRN
  Administered 2017-10-17: 500 mL

## 2017-10-17 MED ORDER — HYDROMORPHONE HCL 1 MG/ML IJ SOLN: 0.2500 mg | INTRAMUSCULAR | Status: DC | PRN

## 2017-10-17 MED ORDER — MESALAMINE 1.2 G PO TBEC
2.4000 g | DELAYED_RELEASE_TABLET | Freq: Two times a day (BID) | ORAL | Status: DC
  Filled 2017-10-17 (×2): qty 2

## 2017-10-17 MED ORDER — CEFAZOLIN SODIUM-DEXTROSE 2-4 GM/100ML-% IV SOLN
2.0000 g | Freq: Four times a day (QID) | INTRAVENOUS | Status: AC
  Administered 2017-10-17 (×2): 2 g via INTRAVENOUS
  Filled 2017-10-17 (×2): qty 100

## 2017-10-17 MED ORDER — HYDROCODONE-ACETAMINOPHEN 7.5-325 MG PO TABS: 1.0000 | ORAL_TABLET | Freq: Once | ORAL | Status: DC | PRN

## 2017-10-17 MED ORDER — MIDAZOLAM HCL 2 MG/2ML IJ SOLN
1.0000 mg | INTRAMUSCULAR | Status: DC | PRN
  Administered 2017-10-17: 1 mg via INTRAVENOUS

## 2017-10-17 MED ORDER — DEXAMETHASONE SODIUM PHOSPHATE 10 MG/ML IJ SOLN
INTRAMUSCULAR | Status: DC | PRN
  Administered 2017-10-17: 4 mg via INTRAVENOUS

## 2017-10-17 MED ORDER — FUROSEMIDE 10 MG/ML IJ SOLN: 20.0000 mg | Freq: Once | INTRAMUSCULAR | Status: DC

## 2017-10-17 MED ORDER — DOCUSATE SODIUM 100 MG PO CAPS: 100.0000 mg | ORAL_CAPSULE | Freq: Two times a day (BID) | ORAL | 1 refills | Status: DC

## 2017-10-17 MED ORDER — METOPROLOL SUCCINATE ER 50 MG PO TB24
100.0000 mg | ORAL_TABLET | Freq: Every day | ORAL | Status: DC
  Administered 2017-10-18: 100 mg via ORAL
  Filled 2017-10-17: qty 2

## 2017-10-17 MED ORDER — FENTANYL CITRATE (PF) 100 MCG/2ML IJ SOLN
50.0000 ug | INTRAMUSCULAR | Status: DC | PRN
  Administered 2017-10-17: 50 ug via INTRAVENOUS

## 2017-10-17 MED ORDER — FENTANYL CITRATE (PF) 100 MCG/2ML IJ SOLN
50.0000 ug | INTRAMUSCULAR | Status: DC
  Filled 2017-10-17: qty 2

## 2017-10-17 MED ORDER — SUGAMMADEX SODIUM 500 MG/5ML IV SOLN
INTRAVENOUS | Status: AC
  Filled 2017-10-17: qty 5

## 2017-10-17 MED ORDER — METHOCARBAMOL 500 MG PO TABS
500.0000 mg | ORAL_TABLET | Freq: Four times a day (QID) | ORAL | Status: DC | PRN
  Administered 2017-10-18: 500 mg via ORAL
  Filled 2017-10-17: qty 1

## 2017-10-17 MED ORDER — METHOCARBAMOL 500 MG PO TABS: 500.0000 mg | ORAL_TABLET | Freq: Four times a day (QID) | ORAL | 1 refills | Status: DC | PRN

## 2017-10-17 MED ORDER — PANTOPRAZOLE SODIUM 40 MG PO TBEC
40.0000 mg | DELAYED_RELEASE_TABLET | Freq: Every day | ORAL | Status: DC
  Administered 2017-10-18: 40 mg via ORAL
  Filled 2017-10-17: qty 1

## 2017-10-17 MED ORDER — SUGAMMADEX SODIUM 500 MG/5ML IV SOLN
INTRAVENOUS | Status: DC | PRN
  Administered 2017-10-17: 300 mg via INTRAVENOUS

## 2017-10-17 MED ORDER — FENTANYL CITRATE (PF) 100 MCG/2ML IJ SOLN
INTRAMUSCULAR | Status: DC | PRN
  Administered 2017-10-17 (×2): 50 ug via INTRAVENOUS

## 2017-10-17 MED ORDER — CEFAZOLIN SODIUM-DEXTROSE 2-4 GM/100ML-% IV SOLN
2.0000 g | INTRAVENOUS | Status: AC
  Administered 2017-10-17: 2 g via INTRAVENOUS
  Filled 2017-10-17: qty 100

## 2017-10-17 MED ORDER — LEVOTHYROXINE SODIUM 75 MCG PO TABS
150.0000 ug | ORAL_TABLET | Freq: Every day | ORAL | Status: DC
  Administered 2017-10-17: 150 ug via ORAL
  Filled 2017-10-17: qty 2

## 2017-10-17 MED ORDER — ALUM & MAG HYDROXIDE-SIMETH 200-200-20 MG/5ML PO SUSP: 30.0000 mL | ORAL | Status: DC | PRN

## 2017-10-17 MED ORDER — OXYCODONE-ACETAMINOPHEN 5-325 MG PO TABS: 1.0000 | ORAL_TABLET | ORAL | 0 refills | Status: DC | PRN

## 2017-10-17 MED ORDER — EPHEDRINE SULFATE-NACL 50-0.9 MG/10ML-% IV SOSY
PREFILLED_SYRINGE | INTRAVENOUS | Status: DC | PRN
  Administered 2017-10-17: 10 mg via INTRAVENOUS

## 2017-10-17 MED ORDER — ONDANSETRON HCL 4 MG/2ML IJ SOLN
INTRAMUSCULAR | Status: AC
  Filled 2017-10-17: qty 2

## 2017-10-17 MED ORDER — ERYTHROMYCIN BASE 250 MG PO TBEC
250.0000 mg | DELAYED_RELEASE_TABLET | Freq: Three times a day (TID) | ORAL | Status: DC
  Filled 2017-10-17 (×5): qty 1

## 2017-10-17 MED ORDER — SUGAMMADEX SODIUM 200 MG/2ML IV SOLN
INTRAVENOUS | Status: AC
  Filled 2017-10-17: qty 2

## 2017-10-17 MED ORDER — METOCLOPRAMIDE HCL 5 MG PO TABS: 5.0000 mg | ORAL_TABLET | Freq: Three times a day (TID) | ORAL | Status: DC | PRN

## 2017-10-17 MED ORDER — BUPIVACAINE-EPINEPHRINE (PF) 0.5% -1:200000 IJ SOLN
INTRAMUSCULAR | Status: AC
  Filled 2017-10-17: qty 30

## 2017-10-17 MED ORDER — METHOCARBAMOL 1000 MG/10ML IJ SOLN
500.0000 mg | Freq: Four times a day (QID) | INTRAMUSCULAR | Status: DC | PRN
  Administered 2017-10-17: 500 mg via INTRAVENOUS
  Filled 2017-10-17: qty 550

## 2017-10-17 MED ORDER — MESALAMINE 4 G RE ENEM
4.0000 g | ENEMA | Freq: Every morning | RECTAL | Status: DC
  Filled 2017-10-17 (×2): qty 60

## 2017-10-17 MED ORDER — LACTATED RINGERS IV SOLN
INTRAVENOUS | Status: DC
  Administered 2017-10-17: 10:00:00 via INTRAVENOUS

## 2017-10-17 MED ORDER — INSULIN ASPART 100 UNIT/ML ~~LOC~~ SOLN
0.0000 [IU] | Freq: Three times a day (TID) | SUBCUTANEOUS | Status: DC
  Administered 2017-10-17: 5 [IU] via SUBCUTANEOUS

## 2017-10-17 MED ORDER — CLONAZEPAM 0.5 MG PO TABS
0.5000 mg | ORAL_TABLET | Freq: Every day | ORAL | Status: DC
  Administered 2017-10-17: 0.5 mg via ORAL
  Filled 2017-10-17: qty 1

## 2017-10-17 MED ORDER — EPHEDRINE 5 MG/ML INJ
INTRAVENOUS | Status: AC
  Filled 2017-10-17: qty 10

## 2017-10-17 SURGICAL SUPPLY — 41 items
ANCH SUT SWLK 19.1X4.75 VT (Anchor) ×2 IMPLANT
ANCHOR PEEK 4.75X19.1 SWLK C (Anchor) ×2 IMPLANT
BLADE CUDA SHAVER 3.5 (BLADE) ×2 IMPLANT
BLADE SURG SZ11 CARB STEEL (BLADE) ×2 IMPLANT
CLOTH 2% CHLOROHEXIDINE 3PK (PERSONAL CARE ITEMS) ×2 IMPLANT
COVER SURGICAL LIGHT HANDLE (MISCELLANEOUS) ×2 IMPLANT
DRAPE ORTHO SPLIT 77X108 STRL (DRAPES) ×2
DRAPE POUCH INSTRU U-SHP 10X18 (DRAPES) ×1 IMPLANT
DRAPE STERI 35X30 U-POUCH (DRAPES) ×2 IMPLANT
DRAPE SURG ORHT 6 SPLT 77X108 (DRAPES) ×1 IMPLANT
DRSG AQUACEL AG ADV 3.5X 4 (GAUZE/BANDAGES/DRESSINGS) ×1 IMPLANT
DURAPREP 26ML APPLICATOR (WOUND CARE) ×2 IMPLANT
ELECT NDL TIP 2.8 STRL (NEEDLE) IMPLANT
ELECT NEEDLE TIP 2.8 STRL (NEEDLE) ×2 IMPLANT
ELECT REM PT RETURN 15FT ADLT (MISCELLANEOUS) ×2 IMPLANT
GLOVE BIOGEL PI IND STRL 7.0 (GLOVE) ×1 IMPLANT
GLOVE BIOGEL PI INDICATOR 7.0 (GLOVE) ×3
GLOVE SURG SS PI 7.0 STRL IVOR (GLOVE) ×2 IMPLANT
GLOVE SURG SS PI 7.5 STRL IVOR (GLOVE) ×2 IMPLANT
GLOVE SURG SS PI 8.0 STRL IVOR (GLOVE) ×4 IMPLANT
GOWN STRL REUS W/TWL XL LVL3 (GOWN DISPOSABLE) ×5 IMPLANT
KIT BASIN OR (CUSTOM PROCEDURE TRAY) ×2 IMPLANT
KIT POSITION SHOULDER SCHLEI (MISCELLANEOUS) ×2 IMPLANT
MANIFOLD NEPTUNE II (INSTRUMENTS) ×2 IMPLANT
NDL SCORPION MULTI FIRE (NEEDLE) IMPLANT
NDL SPNL 18GX3.5 QUINCKE PK (NEEDLE) ×1 IMPLANT
NEEDLE SCORPION MULTI FIRE (NEEDLE) ×2 IMPLANT
NEEDLE SPNL 18GX3.5 QUINCKE PK (NEEDLE) ×2 IMPLANT
PACK SHOULDER (CUSTOM PROCEDURE TRAY) ×2 IMPLANT
POSITIONER SURGICAL ARM (MISCELLANEOUS) ×2 IMPLANT
SLING ARM FOAM STRAP LRG (SOFTGOODS) ×1 IMPLANT
SLING ARM IMMOBILIZER LRG (SOFTGOODS) ×1 IMPLANT
STRIP CLOSURE SKIN 1/2X4 (GAUZE/BANDAGES/DRESSINGS) ×1 IMPLANT
SUCTION FRAZIER HANDLE 12FR (TUBING) ×1
SUCTION TUBE FRAZIER 12FR DISP (TUBING) IMPLANT
SUT FIBERWIRE #2 38 T-5 BLUE (SUTURE) ×2
SUT PROLENE 3 0 PS 2 (SUTURE) ×1 IMPLANT
SUT VIC AB 1-0 CT2 27 (SUTURE) ×2 IMPLANT
SUTURE FIBERWR #2 38 T-5 BLUE (SUTURE) IMPLANT
TAPE FIBER 2MM 7IN #2 BLUE (SUTURE) ×1 IMPLANT
TOWEL OR 17X26 10 PK STRL BLUE (TOWEL DISPOSABLE) ×2 IMPLANT

## 2017-10-17 NOTE — Progress Notes (Signed)
AssistedDr. Lyndle Herrlich with left, ultrasound guided, interscalene  block. Side rails up, monitors on throughout procedure. See vital signs in flow sheet. Tolerated Procedure well.

## 2017-10-17 NOTE — Anesthesia Procedure Notes (Signed)
Procedure Name: Intubation Date/Time: 10/17/2017 11:45 AM Performed by: Glory Buff, CRNA Pre-anesthesia Checklist: Patient identified, Emergency Drugs available, Suction available and Patient being monitored Patient Re-evaluated:Patient Re-evaluated prior to induction Oxygen Delivery Method: Circle system utilized Preoxygenation: Pre-oxygenation with 100% oxygen Induction Type: IV induction Ventilation: Mask ventilation without difficulty Laryngoscope Size: Miller and 3 Grade View: Grade I Tube type: Oral Tube size: 7.0 mm Number of attempts: 1 Airway Equipment and Method: Stylet and Oral airway Placement Confirmation: ETT inserted through vocal cords under direct vision,  positive ETCO2 and breath sounds checked- equal and bilateral Secured at: 21 cm Tube secured with: Tape Dental Injury: Teeth and Oropharynx as per pre-operative assessment

## 2017-10-17 NOTE — Anesthesia Procedure Notes (Signed)
Anesthesia Regional Block: Interscalene brachial plexus block   Pre-Anesthetic Checklist: ,, timeout performed, Correct Patient, Correct Site, Correct Laterality, Correct Procedure, Correct Position, site marked, Risks and benefits discussed, pre-op evaluation,  At surgeon's request and post-op pain management  Laterality: Left  Prep: chloraprep       Needles:   Needle Type: Echogenic Needle     Needle Length: 9cm  Needle Gauge: 21     Additional Needles:   Procedures:,,,, ultrasound used (permanent image in chart),,,,  Narrative:  Start time: 10/17/2017 10:50 AM End time: 10/17/2017 10:58 AM Injection made incrementally with aspirations every 5 mL. Anesthesiologist: Lyndle Herrlich, MD

## 2017-10-17 NOTE — Interval H&P Note (Signed)
History and Physical Interval Note:  10/17/2017 10:52 AM  Meagan Allen  has presented today for surgery, with the diagnosis of Left shoulder rotator cuff tear  The various methods of treatment have been discussed with the patient and family. After consideration of risks, benefits and other options for treatment, the patient has consented to  Procedure(s) with comments: Left shoulder mini open rotator cuff repair, subacromial decompression, possible patch graft (Left) - 90 mins as a surgical intervention .  The patient's history has been reviewed, patient examined, no change in status, stable for surgery.  I have reviewed the patient's chart and labs.  Questions were answered to the patient's satisfaction.     Marquest Gunkel C

## 2017-10-17 NOTE — Discharge Instructions (Signed)
Aquacel dressing may remain in place until follow up. May shower with aquacel dressing in place. If the dressing becomes saturated or peels off, you may remove it and place a new dressing with gauze and tape which should be kept clean and dry and changed daily. Use sling at times except when exercising or showering No driving for 4-6 weeks No lifting for 6 weeks operative arm Pendulum exercises as instructed. Ok to move wrist, elbow, and hand. See Dr. Tonita Cong in 10-14 days. Take one aspirin per day with a meal if not on a blood thinner or allergic to aspirin.

## 2017-10-17 NOTE — Brief Op Note (Signed)
10/17/2017  12:37 PM  PATIENT:  Rodena Piety Haque  62 y.o. female  PRE-OPERATIVE DIAGNOSIS:  Left shoulder rotator cuff tear  POST-OPERATIVE DIAGNOSIS:  Left shoulder rotator cuff tear  PROCEDURE:  Procedure(s) with comments: Left shoulder mini open rotator cuff repair, subacromial decompression (Left) - Interscalene Block  SURGEON:  Surgeon(s) and Role:    [name], MD - Primary  PHYSICIAN ASSISTANT:   ASSISTANTS: Bissell   ANESTHESIA:   general  EBL:  10 mL   BLOOD ADMINISTERED:none  DRAINS: none   LOCAL MEDICATIONS USED:  MARCAINE     SPECIMEN:  No Specimen  DISPOSITION OF SPECIMEN:  N/A  COUNTS:  YES  TOURNIQUET:  * No tourniquets in log *  DICTATION: .Other Dictation: Dictation Number [id number]  PLAN OF CARE: Admit for overnight observation  PATIENT DISPOSITION:  PACU - hemodynamically stable.   Delay start of Pharmacological VTE agent (>24hrs) due to surgical blood loss or risk of bleeding: yes

## 2017-10-17 NOTE — Transfer of Care (Signed)
Immediate Anesthesia Transfer of Care Note  Patient: Meagan Allen  Procedure(s) Performed: Left shoulder mini open rotator cuff repair, subacromial decompression (Left Shoulder)  Patient Location: PACU  Anesthesia Type:General  Level of Consciousness: awake, alert  and oriented  Airway & Oxygen Therapy: Patient Spontanous Breathing and Patient connected to face mask oxygen  Post-op Assessment: Report given to RN and Post -op Vital signs reviewed and stable  Post vital signs: Reviewed and stable  Last Vitals:  Vitals Value Taken Time  BP 139/74 10/17/2017 12:54 PM  Temp    Pulse 84 10/17/2017 12:56 PM  Resp    SpO2 94 % 10/17/2017 12:56 PM  Vitals shown include unvalidated device data.  Last Pain:  Vitals:   10/17/17 1004  TempSrc: Oral      Patients Stated Pain Goal: 4 (63/14/97 0263)  Complications: No apparent anesthesia complications

## 2017-10-18 ENCOUNTER — Encounter (HOSPITAL_COMMUNITY): Payer: Self-pay | Admitting: Specialist

## 2017-10-18 DIAGNOSIS — M75122 Complete rotator cuff tear or rupture of left shoulder, not specified as traumatic: Secondary | ICD-10-CM | POA: Diagnosis not present

## 2017-10-18 LAB — BPAM RBC
Blood Product Expiration Date: 201904172359
Blood Product Expiration Date: 201904302359
ISSUE DATE / TIME: 201904031334
ISSUE DATE / TIME: 201904031846
Unit Type and Rh: 9500
Unit Type and Rh: 9500

## 2017-10-18 LAB — TYPE AND SCREEN
ABO/RH(D): O NEG
Antibody Screen: NEGATIVE
Unit division: 0
Unit division: 0

## 2017-10-18 LAB — CBC
HCT: 33.9 % — ABNORMAL LOW (ref 36.0–46.0)
Hemoglobin: 10.9 g/dL — ABNORMAL LOW (ref 12.0–15.0)
MCH: 26.5 pg (ref 26.0–34.0)
MCHC: 32.2 g/dL (ref 30.0–36.0)
MCV: 82.3 fL (ref 78.0–100.0)
Platelets: 373 10*3/uL (ref 150–400)
RBC: 4.12 MIL/uL (ref 3.87–5.11)
RDW: 14.7 % (ref 11.5–15.5)
WBC: 7 10*3/uL (ref 4.0–10.5)

## 2017-10-18 LAB — BASIC METABOLIC PANEL
Anion gap: 8 (ref 5–15)
BUN: 12 mg/dL (ref 6–20)
CO2: 28 mmol/L (ref 22–32)
Calcium: 9.8 mg/dL (ref 8.9–10.3)
Chloride: 107 mmol/L (ref 101–111)
Creatinine, Ser: 0.88 mg/dL (ref 0.44–1.00)
GFR calc Af Amer: >60 mL/min (ref >60)
GFR calc non Af Amer: >60 mL/min (ref >60)
Glucose, Bld: 128 mg/dL — ABNORMAL HIGH (ref 65–99)
Potassium: 4 mmol/L (ref 3.5–5.1)
Sodium: 143 mmol/L (ref 135–145)

## 2017-10-18 LAB — GLUCOSE, CAPILLARY: Glucose-Capillary: 110 mg/dL — ABNORMAL HIGH (ref 65–99)

## 2017-10-18 MED ORDER — ASPIRIN EC 81 MG PO TBEC
81.0000 mg | DELAYED_RELEASE_TABLET | Freq: Every day | ORAL | Status: DC
  Administered 2017-10-18: 81 mg via ORAL
  Filled 2017-10-18: qty 1

## 2017-10-18 NOTE — Op Note (Signed)
Meagan Allen, KRAUSZ                ACCOUNT NO.:  [id number]  MEDICAL RECORD NO.:  [id number]  LOCATION:                                 FACILITY:  PHYSICIAN:  [name], M.D.         DATE OF BIRTH:  DATE OF PROCEDURE:  10/17/2017 DATE OF DISCHARGE:                              OPERATIVE REPORT   PREOPERATIVE DIAGNOSES: 1. Retracted tear of the rotator cuff on the left. 2. Impingement syndrome, left shoulder.  POSTOPERATIVE DIAGNOSES: 1. Retracted tear of the rotator cuff on the left. 2. Impingement syndrome, left shoulder.  PROCEDURES PERFORMED: 1. Mini open rotator cuff repair of the left shoulder. 2. Subacromial decompression with acromioplasty. 3. Bursectomy. 4. Repair of the rotator cuff tendon utilizing 2 SwiveLock suture     anchors.  ANESTHESIA:  General.  ASSISTANT:  Lacie Draft, PA.  HISTORY:  A 62 year old with shoulder pain, chronic retracted tear.  She had preoperative clearance that was extensive and required an extended period of time.  She finally got clearance.  We decided to proceed with rotator cuff repair.  Risks and benefits discussed including bleeding, infection, damage to neurovascular structures, no change in symptoms, worsening symptoms, DVT, PE, anesthetic complications, etc.  TECHNIQUE:  With the patient in supine beach chair position after induction of adequate general anesthesia with 2 g of Kefzol, left shoulder and upper extremity were prepped and draped in usual sterile fashion.  Surgical marker was utilized to delineate the acromion AC joint and coracoid.  A 2.5-cm incision was made over the anterolateral aspect of the acromion.  Subcutaneous tissue was dissected. Electrocautery was utilized to achieve hemostasis.  Marcaine 0.25% with epinephrine was infiltrated in the deltoid.  The raphe between the anterolateral heads was identified and divided in line with skin incision.  Self-retaining retractor was placed.  We evacuated  bloody synovial fluid from the joint, copiously irrigated it.  The biceps tendon was exposed.  Subscap was intact.  The supraspinatus and infraspinatus were completely off the humeral head and retracted. Multiple attempts to grasp and advance the supraspinatus left and identified the supraspinatus as a chronic and extremely degenerated tendon.  It just fragmented.  The only portion of the tendon that was contiguous and intact was the portion of the infraspinatus which was thinned as well.  Mobilized the bursal and articular surface and advanced it anterolaterally.  I placed a SwiveLock into the greater tuberosity after performing a trough.  Used an awl, inserting the SwiveLock.  Excellent resistance to pull up.  Used a FiberTape, passed it through the infraspinatus posteriorly with FiberTape, utilizing the Scorpion suture passer.  Crossed it and pulled the infraspinatus anteriorly and laterally and secured it over the greater tuberosity over the second SwiveLock after piloting a hole with an awl, inserting into SwiveLock with the arm at the side without undue tension.  Redundant suture removed.  We had this coverage.  We copiously irrigated the remainder of the wound.  The defect was too large for a patch graft. After copious irrigation, there was no active bleeding.  I closed the raphe with 1 Vicryl, subcu with 2-0, and skin with Prolene.  Sterile  dressing applied.  Placed in a sling, extubated without difficulty, and transported to the recovery room in satisfactory condition.  The patient tolerated the procedure well.  No complications.  10 mL blood loss.     [name], M.D.   ______________________________ [name], M.D.    Meagan Allen  D:  10/17/2017  T:  10/17/2017  Job:  [id number]

## 2017-10-18 NOTE — Progress Notes (Signed)
Subjective: 1 Day Post-Op Procedure(s) (LRB): Left shoulder mini open rotator cuff repair, subacromial decompression (Left) Patient reports pain as mild.   No c/o Received transfusion as requested by her GI specialist who follows her chronic anemia due to rectal bleeding. Tolerated well. Pt eager to go home today.  Objective: Vital signs in last 24 hours: Temp:  [97.5 F (36.4 C)-98.6 F (37 C)] 97.7 F (36.5 C) (04/04 0832) Pulse Rate:  [71-89] 85 (04/04 0832) Resp:  [16-20] 18 (04/04 0832) BP: (108-149)/(53-83) 138/65 (04/04 0926) SpO2:  [95 %-100 %] 97 % (04/04 0832)  Intake/Output from previous day: 04/03 0701 - 04/04 0700 In: 1830.3 [P.O.:270; I.V.:821; Blood:639.3; IV Piggyback:100] Out: 2110 [Urine:2100; Blood:10] Intake/Output this shift: Total I/O In: 360 [P.O.:360] Out: -   Recent Labs    10/17/17 1054 10/17/17 1304 10/18/17 0534  HGB 8.9* 8.9* 10.9*   Recent Labs    10/17/17 1054 10/17/17 1304 10/18/17 0534  WBC 5.4  --  7.0  RBC 3.49*  --  4.12  HCT 28.2* 28.6* 33.9*  PLT 383  --  373   Recent Labs    10/18/17 0534  NA 143  K 4.0  CL 107  CO2 28  BUN 12  CREATININE 0.88  GLUCOSE 128*  CALCIUM 9.8   No results for input(s): LABPT, INR in the last 72 hours.  Neurologically intact ABD soft Neurovascular intact Sensation intact distally Intact pulses distally Dorsiflexion/Plantar flexion intact Incision: dressing C/D/I and no drainage No cellulitis present Compartment soft no sign of DVT   Assessment/Plan: 1 Day Post-Op Procedure(s) (LRB): Left shoulder mini open rotator cuff repair, subacromial decompression (Left) Advance diet Up with therapy D/C IV fluids  D/c home today Discussed with Dr. Mliss Fritz, Conley Rolls. 10/18/2017, 10:45 AM

## 2017-10-18 NOTE — Progress Notes (Signed)
Patient discharged to home with family. Given all belongings, instructions, prescriptions, equipment. Patient and husband verbalized understanding of instructions. Escorted to pov via w/c.

## 2017-10-18 NOTE — Evaluation (Signed)
Occupational Therapy Evaluation Patient Details Name: Meagan Allen MRN: [id number] DOB: June 14, 1956 Today's Date: 10/18/2017    History of Present Illness This 62 year old female was admitted for L RCR and SAD.  PMH:  fibromyalgia and cardiomyopathy   Clinical Impression   Pt was admitted for the above sx.  Education was completed.  Pt will follow up with Dr Tonita Cong for further rehab    Follow Up Recommendations  Follow surgeon's recommendation for DC plan and follow-up therapies    Equipment Recommendations  None recommended by OT    Recommendations for Other Services       Precautions / Restrictions Precautions Precautions: Fall Restrictions Weight Bearing Restrictions: Yes RUE Weight Bearing: Non weight bearing      Mobility Bed Mobility               General bed mobility comments: minA for OOB. Pt plans to sleep in recliner  Transfers Overall transfer level: Needs assistance Equipment used: None             General transfer comment: supervision for safety    Balance Overall balance assessment: No apparent balance deficits (not formally assessed)                                         ADL either performed or assessed with clinical judgement   ADL Overall ADL's : Needs assistance/impaired Eating/Feeding: Set up   Grooming: Set up   Upper Body Bathing: Moderate assistance;Sitting   Lower Body Bathing: Moderate assistance;Sit to/from stand   Upper Body Dressing : Maximal assistance;Sitting   Lower Body Dressing: Maximal assistance;Sit to/from stand   Toilet Transfer: Supervision/safety;Stand-pivot(to chair)   Toileting- Water quality scientist and Hygiene: Modified independent         General ADL Comments: husband present; completed adl, educated on shoulder precautions/adaptations to work around shoulder and not move it. He will assist her at home. He is familiar with slings and didn't feel he needed to practice this.   Observed application and adjustment. Demonstrated pendulum exercises. Pt's pain started to rise and she did not want to complete them at this time.  Both verbalize understanding. See education section of chart     Vision         Perception     Praxis      Pertinent Vitals/Pain Pain Assessment: 0-10 Pain Score: 3  Pain Location: L shoulder Pain Descriptors / Indicators: Aching Pain Intervention(s): Limited activity within patient's tolerance;Monitored during session;Repositioned;Patient requesting pain meds-RN notified;Ice applied     Hand Dominance     Extremity/Trunk Assessment Upper Extremity Assessment Upper Extremity Assessment: LUE deficits/detail(immobilized; able to move fingers, wrist, elbow)           Communication Communication Communication: No difficulties   Cognition Arousal/Alertness: Awake/alert Behavior During Therapy: WFL for tasks assessed/performed Overall Cognitive Status: Within Functional Limits for tasks assessed                                     General Comments       Exercises     Shoulder Instructions      Home Living Family/patient expects to be discharged to:: Private residence Living Arrangements: Spouse/significant other                 Bathroom Shower/Tub:  Tub/shower unit   Bathroom Toilet: Handicapped height                Prior Functioning/Environment Level of Independence: Independent                 OT Problem List:        OT Treatment/Interventions:      OT Goals(Current goals can be found in the care plan section) Acute Rehab OT Goals Patient Stated Goal: have a good recovery OT Goal Formulation: All assessment and education complete, DC therapy  OT Frequency:     Barriers to D/C:            Co-evaluation              AM-PAC PT "6 Clicks" Daily Activity     Outcome Measure Help from another person eating meals?: A Little Help from another person taking care of  personal grooming?: A Little Help from another person toileting, which includes using toliet, bedpan, or urinal?: A Little Help from another person bathing (including washing, rinsing, drying)?: A Lot Help from another person to put on and taking off regular upper body clothing?: A Lot Help from another person to put on and taking off regular lower body clothing?: A Lot 6 Click Score: 15   End of Session Nurse Communication: (finished with OT; ready for d/c)  Activity Tolerance: Patient limited by pain Patient left: in chair;with call bell/phone within reach;with family/visitor present  OT Visit Diagnosis: Pain Pain - Right/Left: Left Pain - part of body: Shoulder                Time: 6606-0045 OT Time Calculation (min): 34 min Charges:  OT General Charges $OT Visit: 1 Visit OT Evaluation $OT Eval Low Complexity: 1 Low OT Treatments $Self Care/Home Management : 8-22 mins G-Codes:     Lesle Chris, OTR/L [phone] 10/18/2017.  Otisha Spickler 10/18/2017, 10:04 AM

## 2017-10-18 NOTE — Anesthesia Postprocedure Evaluation (Signed)
Anesthesia Post Note  Patient: Meagan Allen  Procedure(s) Performed: Left shoulder mini open rotator cuff repair, subacromial decompression (Left Shoulder)     Patient location during evaluation: PACU Anesthesia Type: Regional Level of consciousness: awake and alert Pain management: pain level controlled Vital Signs Assessment: post-procedure vital signs reviewed and stable Respiratory status: spontaneous breathing, nonlabored ventilation, respiratory function stable and patient connected to nasal cannula oxygen Cardiovascular status: blood pressure returned to baseline and stable Postop Assessment: no apparent nausea or vomiting Anesthetic complications: no    Last Vitals:  Vitals:   10/18/17 0832 10/18/17 0926  BP: (!) 108/53 138/65  Pulse: 85   Resp: 18   Temp: 36.5 C   SpO2: 97%     Last Pain:  Vitals:   10/18/17 1112  TempSrc:   PainSc: 0-No pain   Pain Goal: Patients Stated Pain Goal: 4 (10/17/17 1004)               Jream Broyles EDWARD

## 2017-10-31 DIAGNOSIS — M1711 Unilateral primary osteoarthritis, right knee: Secondary | ICD-10-CM | POA: Insufficient documentation

## 2017-10-31 DIAGNOSIS — Z4889 Encounter for other specified surgical aftercare: Secondary | ICD-10-CM | POA: Insufficient documentation

## 2017-11-02 DIAGNOSIS — M79671 Pain in right foot: Secondary | ICD-10-CM | POA: Insufficient documentation

## 2017-11-12 ENCOUNTER — Ambulatory Visit (INDEPENDENT_AMBULATORY_CARE_PROVIDER_SITE_OTHER): Payer: BLUE CROSS/BLUE SHIELD | Admitting: Cardiology

## 2017-11-12 ENCOUNTER — Encounter: Payer: Self-pay | Admitting: Cardiology

## 2017-11-12 VITALS — BP 130/82 | HR 84 | Ht 67.0 in | Wt 224.8 lb

## 2017-11-12 DIAGNOSIS — I10 Essential (primary) hypertension: Secondary | ICD-10-CM | POA: Diagnosis not present

## 2017-11-12 DIAGNOSIS — I428 Other cardiomyopathies: Secondary | ICD-10-CM | POA: Diagnosis not present

## 2017-11-12 DIAGNOSIS — R9439 Abnormal result of other cardiovascular function study: Secondary | ICD-10-CM | POA: Diagnosis not present

## 2017-11-12 DIAGNOSIS — E1142 Type 2 diabetes mellitus with diabetic polyneuropathy: Secondary | ICD-10-CM | POA: Diagnosis not present

## 2017-11-12 NOTE — Patient Instructions (Signed)
Medication Instructions:  Your physician recommends that you continue on your current medications as directed. Please refer to the Current Medication list given to you today.   Labwork: Your physician recommends that you return for lab work today: BMP. I will call you with your results. If your lab work is within normal limits, Dr. Agustin Cree will double your dose of entresto.   Testing/Procedures: None  Follow-Up: Your physician wants you to follow-up in: 3 months. You will receive a reminder letter in the mail two months in advance. If you don't receive a letter, please call our office to schedule the follow-up appointment.   Any Other Special Instructions Will Be Listed Below (If Applicable).     If you need a refill on your cardiac medications before your next appointment, please call your pharmacy.

## 2017-11-12 NOTE — Progress Notes (Signed)
Cardiology Office Note:    Date:  11/12/2017   ID:  Meagan Allen, DOB [birth year], MRN [id number]  PCP:  Dianna Rossetti, NP  Cardiologist:  Jenne Campus, MD    Referring MD: Dianna Rossetti, NP   Chief Complaint  Patient presents with  . Follow-up  Doing well  History of Present Illness:    Meagan Allen is a 62 y.o. female with cardiomyopathy diminished ejection fraction but normal cardiac catheterization.  She is on appropriate medication which I will continue.  She went through left rotator cuff shoulder surgery without any problems.  Required blood transfers after that but doing well it is time to increase dose of Entresto will check Chem-7 today if Chem-7 is fine we will double the dose of Entresto  Past Medical History:  Diagnosis Date  . Anemia   . Anxiety   . Arthritis    osteo  . Cancer [facility])    thyroid cancer  . CHF (congestive heart failure) ([place])    diagnosed feb 2019  . Depression   . Dyspnea   . Fibromyalgia   . GERD (gastroesophageal reflux disease)   . Hearing loss of right ear   . Heart murmur   . History of colon polyps   . History of thyroid cancer 2007;  2009   dx papillary thyroid cancer w/ mets to cervical lymph nodes  . Hyperlipidemia   . Hypertension   . IBS (irritable bowel syndrome)   . Left rotator cuff tear   . Migraine   . Nonischemic cardiomyopathy [facility]) cardiologist-  dr Edyth Gunnels   per cardiac cath 08-28-2017  moderate LV dysfunction w/ diffuse hypocontractility ,  ef 35-40%  . PONV (postoperative nausea and vomiting)    last colonoscopy propfol bp dropped and vomiting  . Post-surgical hypothyroidism   . Rosacea   . Type 2 diabetes mellitus ([place])    followed by pcp  . 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[place])     Past Surgical History:  Procedure Laterality Date  . CARDIOVASCULAR STRESS TEST  08-20-2017   dr Agustin Cree   High risk nuclear study w/ large irreverisible defect in the basal and mid inferoseptal, inferior,  inferolateral walls and apical septal, inferior walls with small peri infarct ischemia in the apical anterior & lateral walls (consistant w/ prior MI )/  no ST segment deviation noted /  nuclear stress ef 36%/  recommended cardiac cath  . CARPOMETACARPAL ([place]) FUSION OF THUMB Bilateral 2003  . COLONOSCOPY WITH PROPOFOL N/A 05/06/2013   Procedure: COLONOSCOPY WITH PROPOFOL;  Surgeon: Garlan Fair, MD;  Location: [facility];  Service: Endoscopy;  Laterality: N/A;  . D & C HYSTEROSCOPY /  RESECTION POLYP/ ROLLER BALL ABLATION  02-26-2004   dr Kathyrn Drown  [facility]  . DILATION AND CURETTAGE OF UTERUS  1984  . EXCISION MORTON'S NEUROMA Left 1996  . giant cell tumor  2011   Left ankle 2011  . KNEE ARTHROSCOPY  08-16-2010  dr Beola Cord  [facility];  05/ 2012   right x2 left x1  . LEFT HEART CATH AND CORONARY ANGIOGRAPHY N/A 08/28/2017   Procedure: LEFT HEART CATH AND CORONARY ANGIOGRAPHY;  Surgeon: Troy Sine, MD;  Location: [facility];  Service: Cardiovascular;  Laterality: N/A;   large normal coronary arteries in a dominant RCA system;  moderate LV systoic dysfunction w/ diffuse hypocontractility (compatible w/ nonischemic cardiomyopathy), LV end diastoilc pressure normal, LVEF 35-45% by visual estimate  . NASAL LACRIMAL DUCT SURGERY  06/14/2009  .  REDO RIGHT MODIFIED RADICAL NECK DISSECTION  08-29-2007    [facility]  . SHOULDER ARTHROSCOPY WITH ROTATOR CUFF REPAIR AND SUBACROMIAL DECOMPRESSION Left 10/17/2017   Procedure: Left shoulder mini open rotator cuff repair, subacromial decompression;  Surgeon: [name], MD;  Location: [facility];  Service: Orthopedics;  Laterality: Left;  Interscalene Block  . SHOULDER SURGERY     Left mini- open rotator cuff repair Dr. Tonita Cong 10-17-17  . TOTAL THYROIDECTOMY  2007  -- [facility]   w/ dissection lymph nodes  . TRANSTHORACIC ECHOCARDIOGRAM  08-20-2017  dr Agustin Cree   ef 25-30%, severe diffuse hypokinesis with no identifiable regional variations, but with profound  dyssynchrony, due to arrhythmia insuffient to evaluate LV diastolic dysfunction/  trivial MR/  mild LAE/ Ventricular septum motion abnormal funtion,dyssynergy, & paradox  . WRIST SURGERY Right 2001    Current Medications: Current Meds  Medication Sig  . aspirin 81 MG tablet Take 1 tablet by mouth daily.  Marland Kitchen aspirin EC 81 MG tablet Take 81 mg by mouth daily.  Marland Kitchen buPROPion (WELLBUTRIN XL) 150 MG 24 hr tablet Take 450 mg by mouth every morning.   . cetirizine (ZYRTEC) 10 MG tablet Take 10 mg by mouth daily.  . clonazePAM (KLONOPIN) 0.5 MG tablet Take 0.5 mg by mouth at bedtime.   . ferrous sulfate 325 (65 FE) MG tablet Take 325 mg by mouth daily.  . Insulin Glargine (LANTUS SOLOSTAR) 100 UNIT/ML Solostar Pen Inject 80 Units into the skin daily at 10 pm.   . levothyroxine (SYNTHROID, LEVOTHROID) 150 MCG tablet Take 150 mcg by mouth at bedtime.   Marland Kitchen linaclotide (LINZESS) 290 MCG CAPS capsule Take 290 mcg by mouth daily before breakfast.   . liraglutide (VICTOZA) 18 MG/3ML SOPN Inject 1.2 mg into the skin at bedtime.   . metFORMIN (GLUCOPHAGE-XR) 500 MG 24 hr tablet Take 1,000 mg by mouth every evening.  . methocarbamol (ROBAXIN) 500 MG tablet Take 1 tablet (500 mg total) by mouth every 6 (six) hours as needed for muscle spasms.  . metoprolol succinate (TOPROL-XL) 100 MG 24 hr tablet Take 1 tablet (100 mg total) by mouth daily. Take with or immediately following a meal.  . Misc Natural Products (COSAMIN ASU ADVANCED FORMULA) CAPS Take 2 capsules by mouth daily.   . Multiple Vitamins-Minerals (HAIR SKIN & NAILS ADVANCED PO) Take 1 tablet by mouth daily.  Marland Kitchen oxyCODONE-acetaminophen (PERCOCET) 5-325 MG tablet Take 1-2 tablets by mouth every 4 (four) hours as needed for severe pain.  . pantoprazole (PROTONIX) 40 MG tablet Take 40 mg by mouth daily.  . polyethylene glycol powder (GLYCOLAX/MIRALAX) powder Take 17 g by mouth daily as needed (for constipation.).   Marland Kitchen sacubitril-valsartan (ENTRESTO) 24-26 MG  Take 1 tablet by mouth 2 (two) times daily.     Allergies:   Sulfa antibiotics; Bydureon [exenatide]; Invokana [canagliflozin]; Jardiance [empagliflozin]; Lisinopril; Pravastatin; and Propofol   Social History   Socioeconomic History  . Marital status: Married    Spouse name: Jeneen Rinks  . Number of children: 2  . Years of education: 63  . Highest education level: Not on file  Occupational History  . Occupation: Banker for [employer]: [place]  Social Needs  . Financial resource strain: Not on file  . Food insecurity:    Worry: Not on file    Inability: Not on file  . Transportation needs:    Medical: Not on file    Non-medical: Not on file  Tobacco Use  .  Smoking status: Former Smoker    Packs/day: 3.00    Years: 10.00    Pack years: 30.00    Types: Cigarettes    Last attempt to quit: 07/17/1980    Years since quitting: 37.3  . Smokeless tobacco: Never Used  Substance and Sexual Activity  . Alcohol use: No    Comment: Rare use  . Drug use: No  . Sexual activity: Yes    Birth control/protection: Post-menopausal  Lifestyle  . Physical activity:    Days per week: Not on file    Minutes per session: Not on file  . Stress: Not on file  Relationships  . Social connections:    Talks on phone: Not on file    Gets together: Not on file    Attends religious service: Not on file    Active member of club or organization: Not on file    Attends meetings of clubs or organizations: Not on file    Relationship status: Not on file  Other Topics Concern  . Not on file  Social History Narrative   Tu was born in [place], [place]. She moved to [place] in 1978 when her family moved to this state. Jenise currently lives in [place] with her husband of 56 years. They have 2 adult children and 1 grandson. 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     Family History: The patient's family history includes  Arthritis in her mother; Asthma in her son; Cancer in her brother; Depression in her mother; Diabetes in her father; Gout in her brother, brother, and brother; Heart disease in her father and sister; Hypertension in her father; Stroke in her mother. ROS:   Please see the history of present illness.    All 14 point review of systems negative except as described per history of present illness  EKGs/Labs/Other Studies Reviewed:      Recent Labs: 10/18/2017: BUN 12; Creatinine, Ser 0.88; Hemoglobin 10.9; Platelets 373; Potassium 4.0; Sodium 143  Recent Lipid Panel No results found for: CHOL, TRIG, HDL, CHOLHDL, VLDL, LDLCALC, LDLDIRECT  Physical Exam:    VS:  BP 130/82   Pulse 84   Ht 5' 7"  (1.702 m)   Wt 224 lb 12.8 oz (102 kg)   SpO2 98%   BMI 35.21 kg/m     Wt Readings from Last 3 Encounters:  11/12/17 224 lb 12.8 oz (102 kg)  10/17/17 227 lb (103 kg)  10/10/17 227 lb (103 kg)     GEN:  Well nourished, well developed in no acute distress HEENT: Normal NECK: No JVD; No carotid bruits LYMPHATICS: No lymphadenopathy CARDIAC: RRR, no murmurs, no rubs, no gallops RESPIRATORY:  Clear to auscultation without rales, wheezing or rhonchi  ABDOMEN: Soft, non-tender, non-distended MUSCULOSKELETAL:  No edema; No deformity  SKIN: Warm and dry LOWER EXTREMITIES: no swelling NEUROLOGIC:  Alert and oriented x 3 PSYCHIATRIC:  Normal affect   ASSESSMENT:    1. Essential hypertension   2. Nonischemic cardiomyopathy ([place])   3. Abnormal stress test   4. Type 2 diabetes mellitus with diabetic polyneuropathy, unspecified whether long term insulin use (HCC)    PLAN:    In order of problems listed above:  1. Essential hypertension: Blood pressure slightly elevated but increasing dose of Entresto should help 2. Nonischemic cardia myopathy: Plan as outlined above 3. Abnormal stress test with cardiac catheterization showed normal coronaries 4. Type 2 diabetes followed by internal medicine  team stable.  See her back in 3 months sooner if she get a problem   Medication Adjustments/Labs and Tests Ordered: Current medicines are reviewed at length with the patient today.  Concerns regarding medicines are outlined above.  No orders of the defined types were placed in this encounter.  Medication changes: No orders of the defined types were placed in this encounter.   Signed, Park Liter, MD, [facility] 11/12/2017 4:26 PM    [place]

## 2017-11-13 LAB — BASIC METABOLIC PANEL
BUN/Creatinine Ratio: 10 — ABNORMAL LOW (ref 12–28)
BUN: 9 mg/dL (ref 8–27)
CO2: 24 mmol/L (ref 20–29)
Calcium: 9.7 mg/dL (ref 8.7–10.3)
Chloride: 107 mmol/L — ABNORMAL HIGH (ref 96–106)
Creatinine, Ser: 0.86 mg/dL (ref 0.57–1.00)
GFR calc Af Amer: 83 mL/min/{1.73_m2} (ref >59)
GFR calc non Af Amer: 72 mL/min/{1.73_m2} (ref >59)
Glucose: 111 mg/dL — ABNORMAL HIGH (ref 65–99)
Potassium: 4.2 mmol/L (ref 3.5–5.2)
Sodium: 144 mmol/L (ref 134–144)

## 2017-11-20 ENCOUNTER — Telehealth: Payer: Self-pay | Admitting: Cardiology

## 2017-11-20 NOTE — Telephone Encounter (Signed)
Increasing Entresto? Please call to discuss

## 2017-11-21 MED ORDER — SACUBITRIL-VALSARTAN 49-51 MG PO TABS: 1.0000 | ORAL_TABLET | Freq: Two times a day (BID) | ORAL | 3 refills | Status: DC

## 2017-11-21 NOTE — Telephone Encounter (Signed)
Dr. Agustin Cree reviewed recent lab work and advised for patient to increase entresto from 24-26 mg to 49-51 mg twice daily. New prescription sent to pharmacy. Patient verbalized understanding. No further questions.

## 2018-01-11 DIAGNOSIS — M25562 Pain in left knee: Secondary | ICD-10-CM | POA: Insufficient documentation

## 2018-01-11 DIAGNOSIS — G8929 Other chronic pain: Secondary | ICD-10-CM | POA: Insufficient documentation

## 2018-01-29 ENCOUNTER — Ambulatory Visit
Admission: RE | Admit: 2018-01-29 | Discharge: 2018-01-29 | Disposition: A | Discharge status: Home / Self Care | Payer: BLUE CROSS/BLUE SHIELD | Source: Ambulatory Visit | Attending: Nurse Practitioner | Admitting: Nurse Practitioner

## 2018-01-29 DIAGNOSIS — Z1231 Encounter for screening mammogram for malignant neoplasm of breast: Secondary | ICD-10-CM

## 2018-02-01 ENCOUNTER — Ambulatory Visit (INDEPENDENT_AMBULATORY_CARE_PROVIDER_SITE_OTHER): Payer: BLUE CROSS/BLUE SHIELD | Admitting: Cardiology

## 2018-02-01 ENCOUNTER — Encounter: Payer: Self-pay | Admitting: Cardiology

## 2018-02-01 VITALS — BP 114/66 | HR 81 | Ht 67.0 in | Wt 221.8 lb

## 2018-02-01 DIAGNOSIS — I10 Essential (primary) hypertension: Secondary | ICD-10-CM | POA: Diagnosis not present

## 2018-02-01 DIAGNOSIS — I428 Other cardiomyopathies: Secondary | ICD-10-CM

## 2018-02-01 DIAGNOSIS — K51019 Ulcerative (chronic) pancolitis with unspecified complications: Secondary | ICD-10-CM

## 2018-02-01 DIAGNOSIS — E782 Mixed hyperlipidemia: Secondary | ICD-10-CM | POA: Diagnosis not present

## 2018-02-01 NOTE — Patient Instructions (Signed)
Medication Instructions:  Your physician recommends that you continue on your current medications as directed. Please refer to the Current Medication list given to you today.   Labwork: Your physician recommends that you return for lab work today: BMP, and Pro BNP   Testing/Procedures: Your physician has requested that you have an echocardiogram. Echocardiography is a painless test that uses sound waves to create images of your heart. It provides your doctor with information about the size and shape of your heart and how well your heart's chambers and valves are working. This procedure takes approximately one hour. There are no restrictions for this procedure.    Follow-Up:Your physician recommends that you schedule a follow-up appointment in: 6 weeks.   Any Other Special Instructions Will Be Listed Below (If Applicable).     If you need a refill on your cardiac medications before your next appointment, please call your pharmacy.  Echocardiogram An echocardiogram, or echocardiography, uses sound waves (ultrasound) to produce an image of your heart. The echocardiogram is simple, painless, obtained within a short period of time, and offers valuable information to your health care provider. The images from an echocardiogram can provide information such as:  Evidence of coronary artery disease (CAD).  Heart size.  Heart muscle function.  Heart valve function.  Aneurysm detection.  Evidence of a past heart attack.  Fluid buildup around the heart.  Heart muscle thickening.  Assess heart valve function.  Tell a health care provider about:  Any allergies you have.  All medicines you are taking, including vitamins, herbs, eye drops, creams, and over-the-counter medicines.  Any problems you or family members have had with anesthetic medicines.  Any blood disorders you have.  Any surgeries you have had.  Any medical conditions you have.  Whether you are pregnant or may be  pregnant. What happens before the procedure? No special preparation is needed. Eat and drink normally. What happens during the procedure?  In order to produce an image of your heart, gel will be applied to your chest and a wand-like tool (transducer) will be moved over your chest. The gel will help transmit the sound waves from the transducer. The sound waves will harmlessly bounce off your heart to allow the heart images to be captured in real-time motion. These images will then be recorded.  You may need an IV to receive a medicine that improves the quality of the pictures. What happens after the procedure? You may return to your normal schedule including diet, activities, and medicines, unless your health care provider tells you otherwise. This information is not intended to replace advice given to you by your health care provider. Make sure you discuss any questions you have with your health care provider. Document Released: 06/30/2000 Document Revised: 02/19/2016 Document Reviewed: 03/10/2013 Elsevier Interactive Patient Education  2017 [employer].

## 2018-02-01 NOTE — Progress Notes (Signed)
Cardiology Office Note:    Date:  02/01/2018   ID:  CAREN Allen, DOB [birth year], MRN [id number]  PCP:  Dianna Rossetti, NP  Cardiologist:  Jenne Campus, MD    Referring MD: Dianna Rossetti, NP   Chief Complaint  Patient presents with  . Follow-up  . Pre-op Exam    Knee surgery, May be in October  I needed knee surgery  History of Present Illness:    Meagan Allen is a 62 y.o. female with nonischemic cardia myopathy ejection fraction 35 to 40% on last evaluation by cardiac catheterization.  Also history of Crohn's disease with anemia.  Comes to our office for follow-up she was recently seen by orthopedic specialist looks like she will required knee replacement surgery she is aiming for October.  Described to be weak tired and fatigue recently CBC was done and she was find to have significant anemia which probably contributed to her symptoms also described episode of swelling legs.  She is asking if it is possible that this is related to congestive heart failure.  I told her that this is probably combination of 2 issues her being anemic and congestive heart failure.  What we will do today will be Chem-7 as well as proBNP based on that we will decide how aggressive we need to be with diuresis.  She is already on beta-blocker as well as Entresto but I will continue.  In about a month we will repeat her echocardiogram and then shortly after that I will see her back  Past Medical History:  Diagnosis Date  . Anemia   . Anxiety   . Arthritis    osteo  . Cancer [facility])    thyroid cancer  . CHF (congestive heart failure) ([place])    diagnosed feb 2019  . Depression   . Dyspnea   . Fibromyalgia   . GERD (gastroesophageal reflux disease)   . Hearing loss of right ear   . Heart murmur   . History of colon polyps   . History of thyroid cancer 2007;  2009   dx papillary thyroid cancer w/ mets to cervical lymph nodes  . Hyperlipidemia   . Hypertension   . IBS (irritable bowel syndrome)     . Left rotator cuff tear   . Migraine   . Nonischemic cardiomyopathy [facility]) cardiologist-  dr Edyth Gunnels   per cardiac cath 08-28-2017  moderate LV dysfunction w/ diffuse hypocontractility ,  ef 35-40%  . PONV (postoperative nausea and vomiting)    last colonoscopy propfol bp dropped and vomiting  . Post-surgical hypothyroidism   . Rosacea   . Type 2 diabetes mellitus ([place])    followed by pcp  . 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[place])     Past Surgical History:  Procedure Laterality Date  . CARDIOVASCULAR STRESS TEST  08-20-2017   dr Agustin Cree   High risk nuclear study w/ large irreverisible defect in the basal and mid inferoseptal, inferior, inferolateral walls and apical septal, inferior walls with small peri infarct ischemia in the apical anterior & lateral walls (consistant w/ prior MI )/  no ST segment deviation noted /  nuclear stress ef 36%/  recommended cardiac cath  . CARPOMETACARPAL ([place]) FUSION OF THUMB Bilateral 2003  . COLONOSCOPY WITH PROPOFOL N/A 05/06/2013   Procedure: COLONOSCOPY WITH PROPOFOL;  Surgeon: Garlan Fair, MD;  Location: [facility];  Service: Endoscopy;  Laterality: N/A;  . D & C HYSTEROSCOPY /  RESECTION POLYP/ ROLLER BALL ABLATION  02-26-2004   dr  jarrell  [facility]  . DILATION AND CURETTAGE OF UTERUS  1984  . EXCISION MORTON'S NEUROMA Left 1996  . giant cell tumor  2011   Left ankle 2011  . KNEE ARTHROSCOPY  08-16-2010  dr Beola Cord  [facility];  05/ 2012   right x2 left x1  . LEFT HEART CATH AND CORONARY ANGIOGRAPHY N/A 08/28/2017   Procedure: LEFT HEART CATH AND CORONARY ANGIOGRAPHY;  Surgeon: Troy Sine, MD;  Location: [place] [facility];  Service: Cardiovascular;  Laterality: N/A;   large normal coronary arteries in a dominant RCA system;  moderate LV systoic dysfunction w/ diffuse hypocontractility (compatible w/ nonischemic cardiomyopathy), LV end diastoilc pressure normal, LVEF 35-45% by visual estimate  . NASAL LACRIMAL DUCT SURGERY  06/14/2009  . REDO  RIGHT MODIFIED RADICAL NECK DISSECTION  08-29-2007    [facility]  . SHOULDER ARTHROSCOPY WITH ROTATOR CUFF REPAIR AND SUBACROMIAL DECOMPRESSION Left 10/17/2017   Procedure: Left shoulder mini open rotator cuff repair, subacromial decompression;  Surgeon: [name], MD;  Location: [facility];  Service: Orthopedics;  Laterality: Left;  Interscalene Block  . SHOULDER SURGERY     Left mini- open rotator cuff repair Dr. Tonita Cong 10-17-17  . TOTAL THYROIDECTOMY  2007  -- [facility]   w/ dissection lymph nodes  . TRANSTHORACIC ECHOCARDIOGRAM  08-20-2017  dr Agustin Cree   ef 25-30%, severe diffuse hypokinesis with no identifiable regional variations, but with profound dyssynchrony, due to arrhythmia insuffient to evaluate LV diastolic dysfunction/  trivial MR/  mild LAE/ Ventricular septum motion abnormal funtion,dyssynergy, & paradox  . WRIST SURGERY Right 2001    Current Medications: Current Meds  Medication Sig  . aspirin EC 81 MG tablet Take 81 mg by mouth daily.  Marland Kitchen buPROPion (WELLBUTRIN XL) 150 MG 24 hr tablet Take 450 mg by mouth every morning.   . cetirizine (ZYRTEC) 10 MG tablet Take 10 mg by mouth daily.  . clonazePAM (KLONOPIN) 0.5 MG tablet Take 0.5 mg by mouth at bedtime.   . ferrous sulfate 325 (65 FE) MG tablet Take 325 mg by mouth daily.  . Insulin Glargine (LANTUS SOLOSTAR) 100 UNIT/ML Solostar Pen Inject 80 Units into the skin daily at 10 pm.   . levothyroxine (SYNTHROID, LEVOTHROID) 150 MCG tablet Take 150 mcg by mouth at bedtime.   Marland Kitchen linaclotide (LINZESS) 290 MCG CAPS capsule Take 290 mcg by mouth daily before breakfast.   . liraglutide (VICTOZA) 18 MG/3ML SOPN Inject 1.2 mg into the skin at bedtime.   . metFORMIN (GLUCOPHAGE-XR) 500 MG 24 hr tablet Take 1,000 mg by mouth every evening.  . methocarbamol (ROBAXIN) 500 MG tablet Take 1 tablet (500 mg total) by mouth every 6 (six) hours as needed for muscle spasms.  . metoprolol succinate (TOPROL-XL) 100 MG 24 hr tablet Take 1 tablet (100 mg total) by  mouth daily. Take with or immediately following a meal.  . Misc Natural Products (COSAMIN ASU ADVANCED FORMULA) CAPS Take 2 capsules by mouth daily.   . Multiple Vitamins-Minerals (HAIR SKIN & NAILS ADVANCED PO) Take 1 tablet by mouth daily.  Marland Kitchen oxyCODONE-acetaminophen (PERCOCET) 5-325 MG tablet Take 1-2 tablets by mouth every 4 (four) hours as needed for severe pain.  . pantoprazole (PROTONIX) 40 MG tablet Take 40 mg by mouth daily.  . polyethylene glycol powder (GLYCOLAX/MIRALAX) powder Take 17 g by mouth daily as needed (for constipation.).   Marland Kitchen sacubitril-valsartan (ENTRESTO) 49-51 MG Take 1 tablet by mouth 2 (two) times daily.     Allergies:  Sulfa antibiotics; Bydureon [exenatide]; Invokana [canagliflozin]; Jardiance [empagliflozin]; Lisinopril; Pravastatin; and Propofol   Social History   Socioeconomic History  . Marital status: Married    Spouse name: Jeneen Rinks  . Number of children: 2  . Years of education: 61  . Highest education level: Not on file  Occupational History  . Occupation: Banker for [employer]: [employer]  Social Needs  . Financial resource strain: Not on file  . Food insecurity:    Worry: Not on file    Inability: Not on file  . Transportation needs:    Medical: Not on file    Non-medical: Not on file  Tobacco Use  . Smoking status: Former Smoker    Packs/day: 3.00    Years: 10.00    Pack years: 30.00    Types: Cigarettes    Last attempt to quit: 07/17/1980    Years since quitting: 37.5  . Smokeless tobacco: Never Used  Substance and Sexual Activity  . Alcohol use: No    Comment: Rare use  . Drug use: No  . Sexual activity: Yes    Birth control/protection: Post-menopausal  Lifestyle  . Physical activity:    Days per week: Not on file    Minutes per session: Not on file  . Stress: Not on file  Relationships  . Social connections:    Talks on phone: Not on file    Gets together: Not on file    Attends religious  service: Not on file    Active member of club or organization: Not on file    Attends meetings of clubs or organizations: Not on file    Relationship status: Not on file  Other Topics Concern  . Not on file  Social History Narrative   Takyla was born in [place], [place]. She moved to [place] in 1978 when her family moved to this state. Gerlene currently lives in [place] with her husband of 8 years. They have 2 adult children and 1 grandson. 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     Family History: The patient's family history includes Arthritis in her mother; Asthma in her son; Cancer in her brother; Depression in her mother; Diabetes in her father; Gout in her brother, brother, and brother; Heart disease in her father and sister; Hypertension in her father; Stroke in her mother. ROS:   Please see the history of present illness.    All 14 point review of systems negative except as described per history of present illness  EKGs/Labs/Other Studies Reviewed:      Recent Labs: 10/18/2017: Hemoglobin 10.9; Platelets 373 11/12/2017: BUN 9; Creatinine, Ser 0.86; Potassium 4.2; Sodium 144  Recent Lipid Panel No results found for: CHOL, TRIG, HDL, CHOLHDL, VLDL, LDLCALC, LDLDIRECT  Physical Exam:    VS:  BP 114/66   Pulse 81   Ht 5' 7"  (1.702 m)   Wt 221 lb 12.8 oz (100.6 kg)   SpO2 98%   BMI 34.74 kg/m     Wt Readings from Last 3 Encounters:  02/01/18 221 lb 12.8 oz (100.6 kg)  11/12/17 224 lb 12.8 oz (102 kg)  10/17/17 227 lb (103 kg)     GEN:  Well nourished, well developed in no acute distress HEENT: Normal NECK: No JVD; No carotid bruits LYMPHATICS: No lymphadenopathy CARDIAC: RRR, no murmurs, no rubs, no gallops RESPIRATORY:  Clear to auscultation without rales, wheezing or rhonchi  ABDOMEN: Soft, non-tender,  non-distended MUSCULOSKELETAL:  No edema; No deformity  SKIN: Warm and dry LOWER EXTREMITIES: no swelling NEUROLOGIC:   Alert and oriented x 3 PSYCHIATRIC:  Normal affect   ASSESSMENT:    1. Nonischemic cardiomyopathy ([place])   2. Essential hypertension   3. Ulcerative pancolitis with complication ([place])   4. Mixed hyperlipidemia    PLAN:    In order of problems listed above:  1. Nonischemic cardiomyopathy on Entresto as well as beta-blocker which I will continue we will check proBNP as well as Chem-7 2. Essential hypertension blood pressure well controlled continue present management 3. Ulcerative colitis with complications leading to anemia.  She does actually does have a follow-up appointment to discuss this issue.  She is also getting iron 4. Mixed dyslipidemia she is not on statin however I would like to get situation more stable before revisiting this issue.   Medication Adjustments/Labs and Tests Ordered: Current medicines are reviewed at length with the patient today.  Concerns regarding medicines are outlined above.  No orders of the defined types were placed in this encounter.  Medication changes: No orders of the defined types were placed in this encounter.   Signed, Park Liter, MD, [facility] 02/01/2018 4:18 PM    [facility]

## 2018-02-02 LAB — BASIC METABOLIC PANEL
BUN/Creatinine Ratio: 16 (ref 12–28)
BUN: 15 mg/dL (ref 8–27)
CO2: 23 mmol/L (ref 20–29)
Calcium: 9.7 mg/dL (ref 8.7–10.3)
Chloride: 106 mmol/L (ref 96–106)
Creatinine, Ser: 0.92 mg/dL (ref 0.57–1.00)
GFR calc Af Amer: 77 mL/min/{1.73_m2} (ref >59)
GFR calc non Af Amer: 66 mL/min/{1.73_m2} (ref >59)
Glucose: 184 mg/dL — ABNORMAL HIGH (ref 65–99)
Potassium: 4.1 mmol/L (ref 3.5–5.2)
Sodium: 141 mmol/L (ref 134–144)

## 2018-02-02 LAB — PRO B NATRIURETIC PEPTIDE: NT-Pro BNP: 113 pg/mL (ref 0–287)

## 2018-02-08 ENCOUNTER — Ambulatory Visit: Payer: BLUE CROSS/BLUE SHIELD | Admitting: Cardiology

## 2018-02-14 ENCOUNTER — Ambulatory Visit (HOSPITAL_COMMUNITY): Payer: BLUE CROSS/BLUE SHIELD | Attending: Cardiology

## 2018-02-14 ENCOUNTER — Other Ambulatory Visit: Payer: Self-pay

## 2018-02-14 DIAGNOSIS — I110 Hypertensive heart disease with heart failure: Secondary | ICD-10-CM | POA: Diagnosis not present

## 2018-02-14 DIAGNOSIS — I509 Heart failure, unspecified: Secondary | ICD-10-CM | POA: Insufficient documentation

## 2018-02-14 DIAGNOSIS — M797 Fibromyalgia: Secondary | ICD-10-CM | POA: Diagnosis not present

## 2018-02-14 DIAGNOSIS — I428 Other cardiomyopathies: Secondary | ICD-10-CM | POA: Diagnosis present

## 2018-02-14 DIAGNOSIS — I10 Essential (primary) hypertension: Secondary | ICD-10-CM | POA: Diagnosis present

## 2018-02-14 DIAGNOSIS — E785 Hyperlipidemia, unspecified: Secondary | ICD-10-CM | POA: Diagnosis not present

## 2018-02-15 ENCOUNTER — Encounter (INDEPENDENT_AMBULATORY_CARE_PROVIDER_SITE_OTHER): Payer: Self-pay

## 2018-02-18 ENCOUNTER — Encounter: Payer: Self-pay | Admitting: Podiatry

## 2018-02-18 ENCOUNTER — Ambulatory Visit (INDEPENDENT_AMBULATORY_CARE_PROVIDER_SITE_OTHER): Payer: BLUE CROSS/BLUE SHIELD | Admitting: Podiatry

## 2018-02-18 DIAGNOSIS — Q828 Other specified congenital malformations of skin: Secondary | ICD-10-CM | POA: Diagnosis not present

## 2018-02-20 NOTE — Progress Notes (Signed)
Subjective: 62 year old female presents the office today for concerns of possible warts to the arch of her right foot that are painful with pressure.  Denies any recent injury or trauma.  She does state that she tries to scrape the areas herself.  No swelling she has noticed that she has no other concerns today. Denies any systemic complaints such as fevers, chills, nausea, vomiting. No acute changes since last appointment, and no other complaints at this time.   Objective: AAO x3, NAD DP/PT pulses palpable bilaterally, CRT less than 3 seconds On the arch of the right foot there are 3 punctate annular hyperkeratotic lesions which appear to be a deep porokeratosis.  Upon debridement there is no evidence of drainage or pus there is no foreign body or evidence of verruca today.  Tenderness palpation of these areas prior to debridement.  Minimal hyperkeratotic tissue submetatarsal area bilaterally however this is very minimal today.  No open lesions or pre-ulcerative lesions.  No pain with calf compression, swelling, warmth, erythema  Assessment: Porokeratosis  Plan: -All treatment options discussed with the patient including all alternatives, risks, complications.  -I started with a porokeratosis on the right foot x3 without any complications or bleeding.  Discussed moisturizer to the area daily.  In general we discussed wearing supportive shoes and orthotics to help offload the areas as well as her prominent metatarsal heads as well as history of neuroma. -Patient encouraged to call the office with any questions, concerns, change in symptoms.   Trula Slade DPM

## 2018-02-24 DIAGNOSIS — Z86018 Personal history of other benign neoplasm: Secondary | ICD-10-CM | POA: Insufficient documentation

## 2018-03-14 ENCOUNTER — Ambulatory Visit: Payer: BLUE CROSS/BLUE SHIELD | Admitting: Cardiology

## 2018-03-19 ENCOUNTER — Telehealth: Payer: Self-pay | Admitting: Cardiology

## 2018-03-19 NOTE — Telephone Encounter (Signed)
Patient explained that she got her appointment date mixed up last week and showed up in the [facility] office on Friday instead of Thursday. Patient explained that she needs to have knee surgery as soon as possible and wanted to know if Dr. Agustin Cree would go ahead and give clearance for this so she could schedule the procedure. Advised her that Dr. Agustin Cree wants to see her in the office before moving forward with clearance paperwork. Moved patient's appointment up from 04/23/18 to 03/25/18 at 2:40. No further questions.

## 2018-03-19 NOTE — Telephone Encounter (Signed)
Patient would like a call about her medical release she dropped off last week.

## 2018-03-25 ENCOUNTER — Ambulatory Visit (INDEPENDENT_AMBULATORY_CARE_PROVIDER_SITE_OTHER): Payer: BLUE CROSS/BLUE SHIELD | Admitting: Cardiology

## 2018-03-25 ENCOUNTER — Encounter: Payer: Self-pay | Admitting: Cardiology

## 2018-03-25 VITALS — BP 130/62 | HR 64 | Resp 10 | Ht 67.0 in | Wt 228.8 lb

## 2018-03-25 DIAGNOSIS — E1142 Type 2 diabetes mellitus with diabetic polyneuropathy: Secondary | ICD-10-CM

## 2018-03-25 DIAGNOSIS — E782 Mixed hyperlipidemia: Secondary | ICD-10-CM

## 2018-03-25 DIAGNOSIS — I420 Dilated cardiomyopathy: Secondary | ICD-10-CM

## 2018-03-25 DIAGNOSIS — I10 Essential (primary) hypertension: Secondary | ICD-10-CM

## 2018-03-25 NOTE — Addendum Note (Signed)
Addended by: Ashok Norris on: 03/25/2018 03:21 PM   Modules accepted: Orders

## 2018-03-25 NOTE — Progress Notes (Signed)
Cardiology Office Note:    Date:  03/25/2018   ID:  [name], DOB 1956-07-04, MRN [id number]  PCP:  Meagan Rossetti, NP  Cardiologist:  Meagan Campus, MD    Referring MD: Meagan Rossetti, NP   Chief Complaint  Patient presents with  . 6 week follow up  Doing well cardiac wise  History of Present Illness:    Meagan Allen is a 62 y.o. female with past medical history significant for nonischemic cardiomyopathy ejection fraction 35% however repeated echocardiogram done just few weeks ago showed normalization of left ventricular ejection fraction 55 to 60% she comes today to office for follow-up overall she is doing well on beta-blocker in form of Toprol-XL as well as Entresto.  Denies have any chest pain tightness squeezing pressure burning chest biggest problem she is complaining about this pain in her right knee.  She is getting ready to have her knee replacement surgery.  From my standpoint review she is a physical candidate for the surgery.  We revisit the history of dyslipidemia she does have intolerance to statin because of fibromyalgia and simply stopped and make those pains worse.  I will schedule her to have fasting lipid profile done and then will decide about therapy we may just try Zetia however it is of her diabetes we need to lower her LDL quite significantly.  Past Medical History:  Diagnosis Date  . Anemia   . Anxiety   . Arthritis    osteo  . Cancer [facility])    thyroid cancer  . CHF (congestive heart failure) ([place])    diagnosed feb 2019  . Depression   . Dyspnea   . Fibromyalgia   . GERD (gastroesophageal reflux disease)   . Hearing loss of right ear   . Heart murmur   . History of colon polyps   . History of thyroid cancer 2007;  2009   dx papillary thyroid cancer w/ mets to cervical lymph nodes  . Hyperlipidemia   . Hypertension   . IBS (irritable bowel syndrome)   . Left rotator cuff tear   . Migraine   . Nonischemic cardiomyopathy [facility]) cardiologist-   dr Meagan Allen   per cardiac cath 08-28-2017  moderate LV dysfunction w/ diffuse hypocontractility ,  ef 35-40%  . PONV (postoperative nausea and vomiting)    last colonoscopy propfol bp dropped and vomiting  . Post-surgical hypothyroidism   . Rosacea   . Type 2 diabetes mellitus ([place])    followed by pcp  . 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[place])     Past Surgical History:  Procedure Laterality Date  . CARDIOVASCULAR STRESS TEST  08-20-2017   dr Meagan Allen   High risk nuclear study w/ large irreverisible defect in the basal and mid inferoseptal, inferior, inferolateral walls and apical septal, inferior walls with small peri infarct ischemia in the apical anterior & lateral walls (consistant w/ prior MI )/  no ST segment deviation noted /  nuclear stress ef 36%/  recommended cardiac cath  . CARPOMETACARPAL ([place]) FUSION OF THUMB Bilateral 2003  . COLONOSCOPY WITH PROPOFOL N/A 05/06/2013   Procedure: COLONOSCOPY WITH PROPOFOL;  Surgeon: Meagan Fair, MD;  Location: [facility];  Service: Endoscopy;  Laterality: N/A;  . D & C HYSTEROSCOPY /  RESECTION POLYP/ ROLLER BALL ABLATION  02-26-2004   dr Meagan Allen  [facility]  . DILATION AND CURETTAGE OF UTERUS  1984  . EXCISION MORTON'S NEUROMA Left 1996  . giant cell tumor  2011   Left ankle 2011  .  KNEE ARTHROSCOPY  08-16-2010  dr Meagan Allen  [facility];  05/ 2012   right x2 left x1  . LEFT HEART CATH AND CORONARY ANGIOGRAPHY N/A 08/28/2017   Procedure: LEFT HEART CATH AND CORONARY ANGIOGRAPHY;  Surgeon: Meagan Sine, MD;  Location: [place] CV LAB;  Service: Cardiovascular;  Laterality: N/A;   large normal coronary arteries in a dominant RCA system;  moderate LV systoic dysfunction w/ diffuse hypocontractility (compatible w/ nonischemic cardiomyopathy), LV end diastoilc pressure normal, LVEF 35-45% by visual estimate  . NASAL LACRIMAL DUCT SURGERY  06/14/2009  . REDO RIGHT MODIFIED RADICAL NECK DISSECTION  08-29-2007    [facility]  . SHOULDER ARTHROSCOPY WITH ROTATOR  CUFF REPAIR AND SUBACROMIAL DECOMPRESSION Left 10/17/2017   Procedure: Left shoulder mini open rotator cuff repair, subacromial decompression;  Surgeon: Meagan Day, MD;  Location: [facility];  Service: Orthopedics;  Laterality: Left;  Interscalene Block  . SHOULDER SURGERY     Left mini- open rotator cuff repair Dr. Tonita Allen 10-17-17  . TOTAL THYROIDECTOMY  2007  -- [facility]   w/ dissection lymph nodes  . TRANSTHORACIC ECHOCARDIOGRAM  08-20-2017  dr Meagan Allen   ef 25-30%, severe diffuse hypokinesis with no identifiable regional variations, but with profound dyssynchrony, due to arrhythmia insuffient to evaluate LV diastolic dysfunction/  trivial MR/  mild LAE/ Ventricular septum motion abnormal funtion,dyssynergy, & paradox  . WRIST SURGERY Right 2001    Current Medications: Current Meds  Medication Sig  . aspirin EC 81 MG tablet Take 81 mg by mouth daily.  Marland Kitchen buPROPion (WELLBUTRIN XL) 150 MG 24 hr tablet Take 450 mg by mouth every morning.   . cetirizine (ZYRTEC) 10 MG tablet Take 10 mg by mouth daily.  . clonazePAM (KLONOPIN) 0.5 MG tablet Take 0.5 mg by mouth at bedtime.   . ferrous sulfate 325 (65 FE) MG tablet Take 325 mg by mouth daily.  . Insulin Glargine (LANTUS SOLOSTAR) 100 UNIT/ML Solostar Pen Inject 80 Units into the skin daily at 10 pm.   . levothyroxine (SYNTHROID, LEVOTHROID) 137 MCG tablet Take 137 mcg by mouth at bedtime.   Marland Kitchen linaclotide (LINZESS) 290 MCG CAPS capsule Take 290 mcg by mouth daily before breakfast.   . liraglutide (VICTOZA) 18 MG/3ML SOPN Inject 1.2 mg into the skin at bedtime.   . metFORMIN (GLUCOPHAGE-XR) 500 MG 24 hr tablet Take 1,000 mg by mouth every evening.  . methocarbamol (ROBAXIN) 500 MG tablet Take 1 tablet (500 mg total) by mouth every 6 (six) hours as needed for muscle spasms.  . metoprolol succinate (TOPROL-XL) 100 MG 24 hr tablet Take 1 tablet (100 mg total) by mouth daily. Take with or immediately following a meal.  . Misc Natural Products (COSAMIN ASU  ADVANCED FORMULA) CAPS Take 2 capsules by mouth daily.   . Multiple Vitamins-Minerals (HAIR SKIN & NAILS ADVANCED PO) Take 1 tablet by mouth daily.  . pantoprazole (PROTONIX) 40 MG tablet Take 40 mg by mouth daily.  . polyethylene glycol powder (GLYCOLAX/MIRALAX) powder Take 17 g by mouth daily as needed (for constipation.).   Marland Kitchen sacubitril-valsartan (ENTRESTO) 49-51 MG Take 1 tablet by mouth 2 (two) times daily.     Allergies:   Sulfa antibiotics; Bydureon [exenatide]; Invokana [canagliflozin]; Jardiance [empagliflozin]; Lisinopril; Pravastatin; and Propofol   Social History   Socioeconomic History  . Marital status: Married    Spouse name: Jeneen Rinks  . Number of children: 2  . Years of education: 60  . Highest education level: Not on file  Occupational History  .  Occupation: Banker for [employer]: [place]  Social Needs  . Financial resource strain: Not on file  . Food insecurity:    Worry: Not on file    Inability: Not on file  . Transportation needs:    Medical: Not on file    Non-medical: Not on file  Tobacco Use  . Smoking status: Former Smoker    Packs/Allen: 3.00    Years: 10.00    Pack years: 30.00    Types: Cigarettes    Last attempt to quit: 07/17/1980    Years since quitting: 37.7  . Smokeless tobacco: Never Used  Substance and Sexual Activity  . Alcohol use: No    Comment: Rare use  . Drug use: No  . Sexual activity: Yes    Birth control/protection: Post-menopausal  Lifestyle  . Physical activity:    Days per week: Not on file    Minutes per session: Not on file  . Stress: Not on file  Relationships  . Social connections:    Talks on phone: Not on file    Gets together: Not on file    Attends religious service: Not on file    Active member of club or organization: Not on file    Attends meetings of clubs or organizations: Not on file    Relationship status: Not on file  Other Topics Concern  . Not on file  Social  History Narrative   Odelle was born in [place], [place]. She moved to [place] in 1978 when her family moved to this state. Kamill currently lives in [place] with her husband of 63 years. They have 2 adult children and 1 grandson. 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     Family History: The patient's family history includes Arthritis in her mother; Asthma in her son; Cancer in her brother; Depression in her mother; Diabetes in her father; Gout in her brother, brother, and brother; Heart disease in her father and sister; Hypertension in her father; Stroke in her mother. ROS:   Please see the history of present illness.    All 14 point review of systems negative except as described per history of present illness  EKGs/Labs/Other Studies Reviewed:      Recent Labs: 10/18/2017: Hemoglobin 10.9; Platelets 373 02/01/2018: BUN 15; Creatinine, Ser 0.92; NT-Pro BNP 113; Potassium 4.1; Sodium 141  Recent Lipid Panel No results found for: CHOL, TRIG, HDL, CHOLHDL, VLDL, LDLCALC, LDLDIRECT  Physical Exam:    VS:  BP 130/62   Pulse 64   Resp 10   Ht 5' 7"  (1.702 m)   Wt 228 lb 12.8 oz (103.8 kg)   BMI 35.84 kg/m     Wt Readings from Last 3 Encounters:  03/25/18 228 lb 12.8 oz (103.8 kg)  02/01/18 221 lb 12.8 oz (100.6 kg)  11/12/17 224 lb 12.8 oz (102 kg)     GEN:  Well nourished, well developed in no acute distress HEENT: Normal NECK: No JVD; No carotid bruits LYMPHATICS: No lymphadenopathy CARDIAC: RRR, no murmurs, no rubs, no gallops RESPIRATORY:  Clear to auscultation without rales, wheezing or rhonchi  ABDOMEN: Soft, non-tender, non-distended MUSCULOSKELETAL:  No edema; No deformity  SKIN: Warm and dry LOWER EXTREMITIES: no swelling NEUROLOGIC:  Alert and oriented x 3 PSYCHIATRIC:  Normal affect   ASSESSMENT:    1. Dilated cardiomyopathy ([place])   2. Essential hypertension   3. Type 2 diabetes mellitus with diabetic  polyneuropathy,  unspecified whether long term insulin use (HCC)    PLAN:    In order of problems listed above:  1. Dilated cardiomyopathy and appropriate medication improvement left ventricular ejection fraction. 2. Essential hypertension doing well from that point review continue present management. 3. Diabetes mellitus for hemoglobin A1c was 6.4 recently.  She is acceptable sooner difficult candidate for right knee replacement surgery in view of her cardiac catheterization which showed no significant coronary artery disease as well as latest echocardiogram showing preserved left ventricular ejection fraction.  I will maintain her on Toprol-XL as well as Entresto.   Medication Adjustments/Labs and Tests Ordered: Current medicines are reviewed at length with the patient today.  Concerns regarding medicines are outlined above.  No orders of the defined types were placed in this encounter.  Medication changes: No orders of the defined types were placed in this encounter.   Signed, Park Liter, MD, [facility] 03/25/2018 3:12 PM    [place]

## 2018-03-25 NOTE — Patient Instructions (Signed)
Medication Instructions:  Your physician recommends that you continue on your current medications as directed. Please refer to the Current Medication list given to you today.   Labwork: Your physician recommends that you return for lab work within 1 week: Lipids. Please fast for this.  Testing/Procedures: None.  Follow-Up: Your physician wants you to follow-up in: 4 months. You will receive a reminder letter in the mail two months in advance. If you don't receive a letter, please call our office to schedule the follow-up appointment.   Any Other Special Instructions Will Be Listed Below (If Applicable).     If you need a refill on your cardiac medications before your next appointment, please call your pharmacy.

## 2018-03-28 LAB — LIPID PANEL
Chol/HDL Ratio: 4.7 ratio — ABNORMAL HIGH (ref 0.0–4.4)
Cholesterol, Total: 192 mg/dL (ref 100–199)
HDL: 41 mg/dL (ref >39)
LDL Calculated: 114 mg/dL — ABNORMAL HIGH (ref 0–99)
Triglycerides: 183 mg/dL — ABNORMAL HIGH (ref 0–149)
VLDL Cholesterol Cal: 37 mg/dL (ref 5–40)

## 2018-04-22 ENCOUNTER — Other Ambulatory Visit: Payer: Self-pay | Admitting: Orthopedic Surgery

## 2018-04-23 ENCOUNTER — Ambulatory Visit: Payer: BLUE CROSS/BLUE SHIELD | Admitting: Cardiology

## 2018-05-24 NOTE — Patient Instructions (Addendum)
Meagan Allen  05/24/2018   Your procedure is scheduled on: 06-03-18     Report to [facility] Main  Entrance    Report to Admitting at 7:45 AM    Call this number if you have problems the morning of surgery [phone]    Remember: Do not eat food or drink liquids :After Midnight.      Take these medicines the morning of surgery with A SIP OF WATER: Bupropion (Wellbutrin), Cetirizine (Zyrtec), Pantoprazole (Protonix), and Metoprolol Succinate   BRUSH YOUR TEETH MORNING OF SURGERY AND RINSE YOUR MOUTH OUT, NO CHEWING GUM CANDY OR MINTS.   DO NOT TAKE ANY DIABETIC MEDICATIONS DAY OF YOUR SURGERY                               You may not have any metal on your body including hair pins and              piercings  Do not wear jewelry, make-up, lotions, powders or perfumes, deodorant             Do not wear nail polish.  Do not shave  48 hours prior to surgery.            Do not bring valuables to the hospital. [place].  Contacts, dentures or bridgework may not be worn into surgery.  Leave suitcase in the car. After surgery it may be brought to your room.    :  Special Instructions: N/A              Please read over the following fact sheets you were given: _____________________________________________________________________  How to Manage Your Diabetes Before and After Surgery  Why is it important to control my blood sugar before and after surgery? . Improving blood sugar levels before and after surgery helps healing and can limit problems. . A way of improving blood sugar control is eating a healthy diet by: o  Eating less sugar and carbohydrates o  Increasing activity/exercise o  Talking with your doctor about reaching your blood sugar goals . High blood sugars (greater than 180 mg/dL) can raise your risk of infections and slow your recovery, so you will need to focus on controlling your diabetes  during the weeks before surgery. . Make sure that the doctor who takes care of your diabetes knows about your planned surgery including the date and location.  How do I manage my blood sugar before surgery? . Check your blood sugar at least 4 times a day, starting 2 days before surgery, to make sure that the level is not too high or low. o Check your blood sugar the morning of your surgery when you wake up and every 2 hours until you get to the Short Stay unit. . If your blood sugar is less than 70 mg/dL, you will need to treat for low blood sugar: o Do not take insulin. o Treat a low blood sugar (less than 70 mg/dL) with  cup of clear juice (cranberry or apple), 4 glucose tablets, OR glucose gel. o Recheck blood sugar in 15 minutes after treatment (to make sure it is greater than 70 mg/dL). If your blood sugar is not greater than 70 mg/dL on recheck,  call [phone] for further instructions. . Report your blood sugar to the short stay nurse when you get to Short Stay.  . If you are admitted to the hospital after surgery: o Your blood sugar will be checked by the staff and you will probably be given insulin after surgery (instead of oral diabetes medicines) to make sure you have good blood sugar levels. o The goal for blood sugar control after surgery is 80-180 mg/dL.   WHAT DO I DO ABOUT MY DIABETES MEDICATION?  Marland Kitchen Do not take oral diabetes medicines (pills) the morning of surgery.  . THE DAY BEFORE SURGERY, take your usual dose of Metformin. However, take only 40 units of Lantus insulin.               [facility] - Preparing for Surgery Before surgery, you can play an important role.  Because skin is not sterile, your skin needs to be as free of germs as possible.  You can reduce the number of germs on your skin by washing with CHG (chlorahexidine gluconate) soap before surgery.  CHG is an antiseptic cleaner which kills germs and bonds with the skin to continue killing germs even after  washing. Please DO NOT use if you have an allergy to CHG or antibacterial soaps.  If your skin becomes reddened/irritated stop using the CHG and inform your nurse when you arrive at Short Stay. Do not shave (including legs and underarms) for at least 48 hours prior to the first CHG shower.  You may shave your face/neck. Please follow these instructions carefully:  1.  Shower with CHG Soap the night before surgery and the  morning of Surgery.  2.  If you choose to wash your hair, wash your hair first as usual with your  normal  shampoo.  3.  After you shampoo, rinse your hair and body thoroughly to remove the  shampoo.                           4.  Use CHG as you would any other liquid soap.  You can apply chg directly  to the skin and wash                       Gently with a scrungie or clean washcloth.  5.  Apply the CHG Soap to your body ONLY FROM THE NECK DOWN.   Do not use on face/ open                           Wound or open sores. Avoid contact with eyes, ears mouth and genitals (private parts).                       Wash face,  Genitals (private parts) with your normal soap.             6.  Wash thoroughly, paying special attention to the area where your surgery  will be performed.  7.  Thoroughly rinse your body with warm water from the neck down.  8.  DO NOT shower/wash with your normal soap after using and rinsing off  the CHG Soap.                9.  Pat yourself dry with a clean towel.            10.  Wear clean  pajamas.            11.  Place clean sheets on your bed the night of your first shower and do not  sleep with pets. Day of Surgery : Do not apply any lotions/deodorants the morning of surgery.  Please wear clean clothes to the hospital/surgery center.  FAILURE TO FOLLOW THESE INSTRUCTIONS MAY RESULT IN THE CANCELLATION OF YOUR SURGERY PATIENT SIGNATURE_________________________________  NURSE  SIGNATURE__________________________________  ________________________________________________________________________   Adam Phenix  An incentive spirometer is a tool that can help keep your lungs clear and active. This tool measures how well you are filling your lungs with each breath. Taking long deep breaths may help reverse or decrease the chance of developing breathing (pulmonary) problems (especially infection) following:  A long period of time when you are unable to move or be active. BEFORE THE PROCEDURE   If the spirometer includes an indicator to show your best effort, your nurse or respiratory therapist will set it to a desired goal.  If possible, sit up straight or lean slightly forward. Try not to slouch.  Hold the incentive spirometer in an upright position. INSTRUCTIONS FOR USE  1. Sit on the edge of your bed if possible, or sit up as far as you can in bed or on a chair. 2. Hold the incentive spirometer in an upright position. 3. Breathe out normally. 4. Place the mouthpiece in your mouth and seal your lips tightly around it. 5. Breathe in slowly and as deeply as possible, raising the piston or the ball toward the top of the column. 6. Hold your breath for 3-5 seconds or for as long as possible. Allow the piston or ball to fall to the bottom of the column. 7. Remove the mouthpiece from your mouth and breathe out normally. 8. Rest for a few seconds and repeat Steps 1 through 7 at least 10 times every 1-2 hours when you are awake. Take your time and take a few normal breaths between deep breaths. 9. The spirometer may include an indicator to show your best effort. Use the indicator as a goal to work toward during each repetition. 10. After each set of 10 deep breaths, practice coughing to be sure your lungs are clear. If you have an incision (the cut made at the time of surgery), support your incision when coughing by placing a pillow or rolled up towels firmly  against it. Once you are able to get out of bed, walk around indoors and cough well. You may stop using the incentive spirometer when instructed by your caregiver.  RISKS AND COMPLICATIONS  Take your time so you do not get dizzy or light-headed.  If you are in pain, you may need to take or ask for pain medication before doing incentive spirometry. It is harder to take a deep breath if you are having pain. AFTER USE  Rest and breathe slowly and easily.  It can be helpful to keep track of a log of your progress. Your caregiver can provide you with a simple table to help with this. If you are using the spirometer at home, follow these instructions: [place] IF:   You are having difficultly using the spirometer.  You have trouble using the spirometer as often as instructed.  Your pain medication is not giving enough relief while using the spirometer.  You develop fever of 100.5 F (38.1 C) or higher. SEEK IMMEDIATE MEDICAL CARE IF:   You cough up bloody sputum that had not  been present before.  You develop fever of 102 F (38.9 C) or greater.  You develop worsening pain at or near the incision site. MAKE SURE YOU:   Understand these instructions.  Will watch your condition.  Will get help right away if you are not doing well or get worse. Document Released: 11/13/2006 Document Revised: 09/25/2011 Document Reviewed: 01/14/2007 [employer] Patient Information 2014 [employer], [place].   ________________________________________________________________________  WHAT IS A BLOOD TRANSFUSION? Blood Transfusion Information  A transfusion is the replacement of blood or some of its parts. Blood is made up of multiple cells which provide different functions.  Red blood cells carry oxygen and are used for blood loss replacement.  White blood cells fight against infection.  Platelets control bleeding.  Plasma helps clot blood.  Other blood products are available for  specialized needs, such as hemophilia or other clotting disorders. BEFORE THE TRANSFUSION  Who gives blood for transfusions?   Healthy volunteers who are fully evaluated to make sure their blood is safe. This is blood bank blood. Transfusion therapy is the safest it has ever been in the practice of medicine. Before blood is taken from a donor, a complete history is taken to make sure that person has no history of diseases nor engages in risky social behavior (examples are intravenous drug use or sexual activity with multiple partners). The donor's travel history is screened to minimize risk of transmitting infections, such as malaria. The donated blood is tested for signs of infectious diseases, such as HIV and hepatitis. The blood is then tested to be sure it is compatible with you in order to minimize the chance of a transfusion reaction. If you or a relative donates blood, this is often done in anticipation of surgery and is not appropriate for emergency situations. It takes many days to process the donated blood. RISKS AND COMPLICATIONS Although transfusion therapy is very safe and saves many lives, the main dangers of transfusion include:   Getting an infectious disease.  Developing a transfusion reaction. This is an allergic reaction to something in the blood you were given. Every precaution is taken to prevent this. The decision to have a blood transfusion has been considered carefully by your caregiver before blood is given. Blood is not given unless the benefits outweigh the risks. AFTER THE TRANSFUSION  Right after receiving a blood transfusion, you will usually feel much better and more energetic. This is especially true if your red blood cells have gotten low (anemic). The transfusion raises the level of the red blood cells which carry oxygen, and this usually causes an energy increase.  The nurse administering the transfusion will monitor you carefully for complications. HOME CARE  INSTRUCTIONS  No special instructions are needed after a transfusion. You may find your energy is better. Speak with your caregiver about any limitations on activity for underlying diseases you may have. SEEK MEDICAL CARE IF:   Your condition is not improving after your transfusion.  You develop redness or irritation at the intravenous (IV) site. SEEK IMMEDIATE MEDICAL CARE IF:  Any of the following symptoms occur over the next 12 hours:  Shaking chills.  You have a temperature by mouth above 102 F (38.9 C), not controlled by medicine.  Chest, back, or muscle pain.  People around you feel you are not acting correctly or are confused.  Shortness of breath or difficulty breathing.  Dizziness and fainting.  You get a rash or develop hives.  You have a decrease in urine output.  Your urine turns a dark color or changes to pink, red, or brown. Any of the following symptoms occur over the next 10 days:  You have a temperature by mouth above 102 F (38.9 C), not controlled by medicine.  Shortness of breath.  Weakness after normal activity.  The white part of the eye turns yellow (jaundice).  You have a decrease in the amount of urine or are urinating less often.  Your urine turns a dark color or changes to pink, red, or brown. Document Released: 06/30/2000 Document Revised: 09/25/2011 Document Reviewed: 02/17/2008 [facility] Patient Information 2014 [place], [place].  _______________________________________________________________________

## 2018-05-24 NOTE — Progress Notes (Signed)
03-25-18 (Epic) Cardiac Clearance from Dr. Agustin Cree  02-14-18 (Epic) ECHO  08-22-17 (Epic) CXR  08-14-17 (Epic) EKG

## 2018-05-27 ENCOUNTER — Encounter (HOSPITAL_COMMUNITY)
Admission: RE | Admit: 2018-05-27 | Discharge: 2018-05-27 | Disposition: A | Discharge status: Home / Self Care | Payer: BLUE CROSS/BLUE SHIELD | Source: Ambulatory Visit | Attending: Orthopedic Surgery | Admitting: Orthopedic Surgery

## 2018-05-27 ENCOUNTER — Other Ambulatory Visit: Payer: Self-pay

## 2018-05-27 ENCOUNTER — Encounter (HOSPITAL_COMMUNITY): Payer: Self-pay

## 2018-05-27 DIAGNOSIS — Z01812 Encounter for preprocedural laboratory examination: Secondary | ICD-10-CM | POA: Diagnosis present

## 2018-05-27 DIAGNOSIS — M1711 Unilateral primary osteoarthritis, right knee: Secondary | ICD-10-CM | POA: Insufficient documentation

## 2018-05-27 HISTORY — DX: Injury of optic tract and pathways, left side, initial encounter: S04.032A

## 2018-05-27 HISTORY — DX: Injury of optic nerve, left eye, initial encounter: S04.012A

## 2018-05-27 LAB — COMPREHENSIVE METABOLIC PANEL
ALT: 21 U/L (ref 0–44)
AST: 26 U/L (ref 15–41)
Albumin: 4.3 g/dL (ref 3.5–5.0)
Alkaline Phosphatase: 77 U/L (ref 38–126)
Anion gap: 8 (ref 5–15)
BUN: 10 mg/dL (ref 8–23)
CO2: 25 mmol/L (ref 22–32)
Calcium: 9.5 mg/dL (ref 8.9–10.3)
Chloride: 108 mmol/L (ref 98–111)
Creatinine, Ser: 0.83 mg/dL (ref 0.44–1.00)
GFR calc Af Amer: >60 mL/min (ref >60)
GFR calc non Af Amer: >60 mL/min (ref >60)
Glucose, Bld: 165 mg/dL — ABNORMAL HIGH (ref 70–99)
Potassium: 4.2 mmol/L (ref 3.5–5.1)
Sodium: 141 mmol/L (ref 135–145)
Total Bilirubin: 0.6 mg/dL (ref 0.3–1.2)
Total Protein: 7.2 g/dL (ref 6.5–8.1)

## 2018-05-27 LAB — CBC WITH DIFFERENTIAL/PLATELET
Abs Immature Granulocytes: 0.04 10*3/uL (ref 0.00–0.07)
Basophils Absolute: 0 10*3/uL (ref 0.0–0.1)
Basophils Relative: 0 %
Eosinophils Absolute: 0 10*3/uL (ref 0.0–0.5)
Eosinophils Relative: 0 %
HCT: 36.2 % (ref 36.0–46.0)
Hemoglobin: 11.2 g/dL — ABNORMAL LOW (ref 12.0–15.0)
Immature Granulocytes: 0 %
Lymphocytes Relative: 16 %
Lymphs Abs: 1.5 10*3/uL (ref 0.7–4.0)
MCH: 27.7 pg (ref 26.0–34.0)
MCHC: 30.9 g/dL (ref 30.0–36.0)
MCV: 89.4 fL (ref 80.0–100.0)
Monocytes Absolute: 0.3 10*3/uL (ref 0.1–1.0)
Monocytes Relative: 3 %
Neutro Abs: 7.4 10*3/uL (ref 1.7–7.7)
Neutrophils Relative %: 81 %
Platelets: 388 10*3/uL (ref 150–400)
RBC: 4.05 MIL/uL (ref 3.87–5.11)
RDW: 15.9 % — ABNORMAL HIGH (ref 11.5–15.5)
WBC: 9.2 10*3/uL (ref 4.0–10.5)
nRBC: 0 % (ref 0.0–0.2)

## 2018-05-27 LAB — GLUCOSE, CAPILLARY: Glucose-Capillary: 175 mg/dL — ABNORMAL HIGH (ref 70–99)

## 2018-05-27 LAB — HEMOGLOBIN A1C
Hgb A1c MFr Bld: 6 % — ABNORMAL HIGH (ref 4.8–5.6)
Mean Plasma Glucose: 125.5 mg/dL

## 2018-05-27 LAB — SURGICAL PCR SCREEN
MRSA, PCR: NEGATIVE
Staphylococcus aureus: NEGATIVE

## 2018-06-03 ENCOUNTER — Encounter (HOSPITAL_COMMUNITY)
Admission: RE | Disposition: A | Discharge status: Home / Self Care | Payer: Self-pay | Source: Ambulatory Visit | Attending: Orthopedic Surgery

## 2018-06-03 ENCOUNTER — Ambulatory Visit (HOSPITAL_COMMUNITY): Payer: BLUE CROSS/BLUE SHIELD | Admitting: Anesthesiology

## 2018-06-03 ENCOUNTER — Other Ambulatory Visit: Payer: Self-pay

## 2018-06-03 ENCOUNTER — Encounter (HOSPITAL_COMMUNITY): Payer: Self-pay | Admitting: *Deleted

## 2018-06-03 ENCOUNTER — Observation Stay (HOSPITAL_COMMUNITY)
Admission: RE | Admit: 2018-06-03 | Discharge: 2018-06-04 | Disposition: A | Discharge status: Home / Self Care | Payer: BLUE CROSS/BLUE SHIELD | Source: Ambulatory Visit | Attending: Orthopedic Surgery | Admitting: Orthopedic Surgery

## 2018-06-03 DIAGNOSIS — F419 Anxiety disorder, unspecified: Secondary | ICD-10-CM | POA: Insufficient documentation

## 2018-06-03 DIAGNOSIS — I252 Old myocardial infarction: Secondary | ICD-10-CM | POA: Diagnosis not present

## 2018-06-03 DIAGNOSIS — I2510 Atherosclerotic heart disease of native coronary artery without angina pectoris: Secondary | ICD-10-CM | POA: Insufficient documentation

## 2018-06-03 DIAGNOSIS — M1711 Unilateral primary osteoarthritis, right knee: Secondary | ICD-10-CM | POA: Diagnosis present

## 2018-06-03 DIAGNOSIS — E1142 Type 2 diabetes mellitus with diabetic polyneuropathy: Secondary | ICD-10-CM | POA: Diagnosis not present

## 2018-06-03 DIAGNOSIS — K219 Gastro-esophageal reflux disease without esophagitis: Secondary | ICD-10-CM | POA: Diagnosis not present

## 2018-06-03 DIAGNOSIS — F329 Major depressive disorder, single episode, unspecified: Secondary | ICD-10-CM | POA: Diagnosis not present

## 2018-06-03 DIAGNOSIS — M797 Fibromyalgia: Secondary | ICD-10-CM | POA: Insufficient documentation

## 2018-06-03 DIAGNOSIS — E890 Postprocedural hypothyroidism: Secondary | ICD-10-CM | POA: Insufficient documentation

## 2018-06-03 DIAGNOSIS — I429 Cardiomyopathy, unspecified: Secondary | ICD-10-CM | POA: Insufficient documentation

## 2018-06-03 DIAGNOSIS — E785 Hyperlipidemia, unspecified: Secondary | ICD-10-CM | POA: Diagnosis not present

## 2018-06-03 DIAGNOSIS — Z7984 Long term (current) use of oral hypoglycemic drugs: Secondary | ICD-10-CM | POA: Insufficient documentation

## 2018-06-03 DIAGNOSIS — I509 Heart failure, unspecified: Secondary | ICD-10-CM | POA: Insufficient documentation

## 2018-06-03 DIAGNOSIS — Z85850 Personal history of malignant neoplasm of thyroid: Secondary | ICD-10-CM | POA: Insufficient documentation

## 2018-06-03 DIAGNOSIS — Z96659 Presence of unspecified artificial knee joint: Secondary | ICD-10-CM

## 2018-06-03 DIAGNOSIS — Z87891 Personal history of nicotine dependence: Secondary | ICD-10-CM | POA: Diagnosis not present

## 2018-06-03 DIAGNOSIS — Z79899 Other long term (current) drug therapy: Secondary | ICD-10-CM | POA: Diagnosis not present

## 2018-06-03 DIAGNOSIS — Z794 Long term (current) use of insulin: Secondary | ICD-10-CM | POA: Insufficient documentation

## 2018-06-03 DIAGNOSIS — I110 Hypertensive heart disease with heart failure: Secondary | ICD-10-CM | POA: Diagnosis not present

## 2018-06-03 DIAGNOSIS — Z882 Allergy status to sulfonamides status: Secondary | ICD-10-CM | POA: Diagnosis not present

## 2018-06-03 HISTORY — PX: TOTAL KNEE ARTHROPLASTY: SHX125

## 2018-06-03 LAB — GLUCOSE, CAPILLARY
Glucose-Capillary: 104 mg/dL — ABNORMAL HIGH (ref 70–99)
Glucose-Capillary: 125 mg/dL — ABNORMAL HIGH (ref 70–99)

## 2018-06-03 SURGERY — ARTHROPLASTY, KNEE, TOTAL
Anesthesia: Spinal | Site: Knee | Laterality: Right

## 2018-06-03 MED ORDER — SODIUM CHLORIDE 0.9 % IR SOLN
Status: DC | PRN
  Administered 2018-06-03: 1000 mL

## 2018-06-03 MED ORDER — PHENOL 1.4 % MT LIQD: 1.0000 | OROMUCOSAL | Status: DC | PRN

## 2018-06-03 MED ORDER — TRANEXAMIC ACID-NACL 1000-0.7 MG/100ML-% IV SOLN
1000.0000 mg | Freq: Once | INTRAVENOUS | Status: AC
  Administered 2018-06-03: 1000 mg via INTRAVENOUS
  Filled 2018-06-03: qty 100

## 2018-06-03 MED ORDER — METHOCARBAMOL 500 MG PO TABS
500.0000 mg | ORAL_TABLET | Freq: Four times a day (QID) | ORAL | Status: DC | PRN
  Administered 2018-06-04: 500 mg via ORAL
  Filled 2018-06-03 (×3): qty 1

## 2018-06-03 MED ORDER — STERILE WATER FOR IRRIGATION IR SOLN
Status: DC | PRN
  Administered 2018-06-03: 2000 mL

## 2018-06-03 MED ORDER — ALUM & MAG HYDROXIDE-SIMETH 200-200-20 MG/5ML PO SUSP: 30.0000 mL | ORAL | Status: DC | PRN

## 2018-06-03 MED ORDER — CEFAZOLIN SODIUM-DEXTROSE 2-4 GM/100ML-% IV SOLN
2.0000 g | Freq: Four times a day (QID) | INTRAVENOUS | Status: AC
  Administered 2018-06-03 (×2): 2 g via INTRAVENOUS
  Filled 2018-06-03 (×2): qty 100

## 2018-06-03 MED ORDER — PROPOFOL 10 MG/ML IV BOLUS
INTRAVENOUS | Status: AC
  Filled 2018-06-03: qty 20

## 2018-06-03 MED ORDER — ACETAMINOPHEN 500 MG PO TABS
1000.0000 mg | ORAL_TABLET | Freq: Once | ORAL | Status: AC
  Administered 2018-06-03: 1000 mg via ORAL
  Filled 2018-06-03: qty 2

## 2018-06-03 MED ORDER — DIPHENHYDRAMINE HCL 12.5 MG/5ML PO ELIX: 12.5000 mg | ORAL_SOLUTION | ORAL | Status: DC | PRN

## 2018-06-03 MED ORDER — MENTHOL 3 MG MT LOZG: 1.0000 | LOZENGE | OROMUCOSAL | Status: DC | PRN

## 2018-06-03 MED ORDER — PHENYLEPHRINE HCL 10 MG/ML IJ SOLN
INTRAMUSCULAR | Status: AC
  Filled 2018-06-03: qty 1

## 2018-06-03 MED ORDER — METOCLOPRAMIDE HCL 5 MG/ML IJ SOLN: 5.0000 mg | Freq: Three times a day (TID) | INTRAMUSCULAR | Status: DC | PRN

## 2018-06-03 MED ORDER — OXYCODONE HCL 5 MG/5ML PO SOLN: 5.0000 mg | Freq: Once | ORAL | Status: DC | PRN

## 2018-06-03 MED ORDER — LACTATED RINGERS IV SOLN
INTRAVENOUS | Status: DC | PRN
  Administered 2018-06-03: 09:00:00 via INTRAVENOUS

## 2018-06-03 MED ORDER — OXYCODONE HCL 5 MG PO TABS
5.0000 mg | ORAL_TABLET | ORAL | Status: DC | PRN
  Administered 2018-06-03 – 2018-06-04 (×2): 5 mg via ORAL
  Filled 2018-06-03 (×2): qty 1

## 2018-06-03 MED ORDER — GABAPENTIN 300 MG PO CAPS
300.0000 mg | ORAL_CAPSULE | Freq: Once | ORAL | Status: AC
  Administered 2018-06-03: 300 mg via ORAL
  Filled 2018-06-03: qty 1

## 2018-06-03 MED ORDER — SODIUM CHLORIDE 0.9 % IV SOLN
INTRAVENOUS | Status: DC | PRN
  Administered 2018-06-03: 20 ug/min via INTRAVENOUS

## 2018-06-03 MED ORDER — ASPIRIN EC 325 MG PO TBEC
325.0000 mg | DELAYED_RELEASE_TABLET | Freq: Two times a day (BID) | ORAL | Status: DC
  Administered 2018-06-04: 325 mg via ORAL
  Filled 2018-06-03: qty 1

## 2018-06-03 MED ORDER — SACUBITRIL-VALSARTAN 49-51 MG PO TABS
1.0000 | ORAL_TABLET | Freq: Two times a day (BID) | ORAL | Status: DC
  Administered 2018-06-03 – 2018-06-04 (×2): 1 via ORAL
  Filled 2018-06-03 (×3): qty 1

## 2018-06-03 MED ORDER — METOPROLOL SUCCINATE ER 50 MG PO TB24
100.0000 mg | ORAL_TABLET | Freq: Every day | ORAL | Status: DC
  Administered 2018-06-04: 100 mg via ORAL
  Filled 2018-06-03: qty 2

## 2018-06-03 MED ORDER — SENNOSIDES-DOCUSATE SODIUM 8.6-50 MG PO TABS: 1.0000 | ORAL_TABLET | Freq: Every evening | ORAL | Status: DC | PRN

## 2018-06-03 MED ORDER — DEXAMETHASONE SODIUM PHOSPHATE 10 MG/ML IJ SOLN
10.0000 mg | Freq: Once | INTRAMUSCULAR | Status: AC
  Administered 2018-06-04: 10 mg via INTRAVENOUS
  Filled 2018-06-03: qty 1

## 2018-06-03 MED ORDER — PANTOPRAZOLE SODIUM 40 MG PO TBEC
40.0000 mg | DELAYED_RELEASE_TABLET | Freq: Every day | ORAL | Status: DC
  Administered 2018-06-04: 40 mg via ORAL
  Filled 2018-06-03: qty 1

## 2018-06-03 MED ORDER — HYDROMORPHONE HCL 1 MG/ML IJ SOLN: 0.5000 mg | INTRAMUSCULAR | Status: DC | PRN

## 2018-06-03 MED ORDER — SODIUM CHLORIDE 0.9% FLUSH
INTRAVENOUS | Status: DC | PRN
  Administered 2018-06-03: 20 mL

## 2018-06-03 MED ORDER — LIRAGLUTIDE 18 MG/3ML ~~LOC~~ SOPN: 1.2000 mg | PEN_INJECTOR | Freq: Every day | SUBCUTANEOUS | Status: DC

## 2018-06-03 MED ORDER — DEXAMETHASONE SODIUM PHOSPHATE 10 MG/ML IJ SOLN
8.0000 mg | Freq: Once | INTRAMUSCULAR | Status: AC
  Administered 2018-06-03: 8 mg via INTRAVENOUS

## 2018-06-03 MED ORDER — PHENYLEPHRINE 40 MCG/ML (10ML) SYRINGE FOR IV PUSH (FOR BLOOD PRESSURE SUPPORT)
PREFILLED_SYRINGE | INTRAVENOUS | Status: DC | PRN
  Administered 2018-06-03 (×3): 80 ug via INTRAVENOUS

## 2018-06-03 MED ORDER — FENTANYL CITRATE (PF) 100 MCG/2ML IJ SOLN: 25.0000 ug | INTRAMUSCULAR | Status: DC | PRN

## 2018-06-03 MED ORDER — LINACLOTIDE 72 MCG PO CAPS
72.0000 ug | ORAL_CAPSULE | ORAL | Status: DC
  Filled 2018-06-03: qty 1

## 2018-06-03 MED ORDER — OXYCODONE HCL 5 MG PO TABS: 5.0000 mg | ORAL_TABLET | Freq: Once | ORAL | Status: DC | PRN

## 2018-06-03 MED ORDER — ROPIVACAINE HCL 7.5 MG/ML IJ SOLN
INTRAMUSCULAR | Status: DC | PRN
  Administered 2018-06-03: 30 mL via PERINEURAL

## 2018-06-03 MED ORDER — PROPOFOL 500 MG/50ML IV EMUL
INTRAVENOUS | Status: DC | PRN
  Administered 2018-06-03: 100 ug/kg/min via INTRAVENOUS

## 2018-06-03 MED ORDER — BUPIVACAINE-EPINEPHRINE (PF) 0.25% -1:200000 IJ SOLN
INTRAMUSCULAR | Status: AC
  Filled 2018-06-03: qty 30

## 2018-06-03 MED ORDER — FERROUS SULFATE 325 (65 FE) MG PO TABS: 325.0000 mg | ORAL_TABLET | Freq: Every day | ORAL | Status: DC

## 2018-06-03 MED ORDER — MIDAZOLAM HCL 2 MG/2ML IJ SOLN: 1.0000 mg | INTRAMUSCULAR | Status: DC

## 2018-06-03 MED ORDER — METFORMIN HCL ER 500 MG PO TB24
1000.0000 mg | ORAL_TABLET | Freq: Every day | ORAL | Status: DC
  Administered 2018-06-03 – 2018-06-04 (×2): 1000 mg via ORAL
  Filled 2018-06-03 (×2): qty 2

## 2018-06-03 MED ORDER — SODIUM CHLORIDE 0.9 % IV SOLN
INTRAVENOUS | Status: DC
  Administered 2018-06-03 – 2018-06-04 (×2): via INTRAVENOUS

## 2018-06-03 MED ORDER — BUPIVACAINE LIPOSOME 1.3 % IJ SUSP
INTRAMUSCULAR | Status: DC | PRN
  Administered 2018-06-03: 20 mL

## 2018-06-03 MED ORDER — ACETAMINOPHEN 160 MG/5ML PO SOLN: 325.0000 mg | ORAL | Status: DC | PRN

## 2018-06-03 MED ORDER — ONDANSETRON HCL 4 MG/2ML IJ SOLN: 4.0000 mg | Freq: Once | INTRAMUSCULAR | Status: DC | PRN

## 2018-06-03 MED ORDER — BUPIVACAINE-EPINEPHRINE 0.25% -1:200000 IJ SOLN
INTRAMUSCULAR | Status: DC | PRN
  Administered 2018-06-03: 30 mL

## 2018-06-03 MED ORDER — METOCLOPRAMIDE HCL 5 MG PO TABS: 5.0000 mg | ORAL_TABLET | Freq: Three times a day (TID) | ORAL | Status: DC | PRN

## 2018-06-03 MED ORDER — CLONAZEPAM 0.5 MG PO TABS
0.5000 mg | ORAL_TABLET | Freq: Every day | ORAL | Status: DC
  Administered 2018-06-03: 0.5 mg via ORAL
  Filled 2018-06-03: qty 1

## 2018-06-03 MED ORDER — FERROUS SULFATE 325 (65 FE) MG PO TABS
325.0000 mg | ORAL_TABLET | Freq: Three times a day (TID) | ORAL | Status: DC
  Administered 2018-06-04 (×3): 325 mg via ORAL
  Filled 2018-06-03 (×3): qty 1

## 2018-06-03 MED ORDER — METHOCARBAMOL 500 MG IVPB - SIMPLE MED
500.0000 mg | Freq: Four times a day (QID) | INTRAVENOUS | Status: DC | PRN
  Filled 2018-06-03: qty 50

## 2018-06-03 MED ORDER — DOCUSATE SODIUM 100 MG PO CAPS
100.0000 mg | ORAL_CAPSULE | Freq: Two times a day (BID) | ORAL | Status: DC
  Administered 2018-06-03 – 2018-06-04 (×2): 100 mg via ORAL
  Filled 2018-06-03 (×2): qty 1

## 2018-06-03 MED ORDER — SODIUM CHLORIDE (PF) 0.9 % IJ SOLN
INTRAMUSCULAR | Status: AC
  Filled 2018-06-03: qty 50

## 2018-06-03 MED ORDER — TRAMADOL HCL 50 MG PO TABS
50.0000 mg | ORAL_TABLET | Freq: Four times a day (QID) | ORAL | Status: DC
  Administered 2018-06-03 – 2018-06-04 (×5): 50 mg via ORAL
  Filled 2018-06-03 (×5): qty 1

## 2018-06-03 MED ORDER — PANTOPRAZOLE SODIUM 40 MG PO TBEC: 40.0000 mg | DELAYED_RELEASE_TABLET | Freq: Every day | ORAL | Status: DC

## 2018-06-03 MED ORDER — INSULIN GLARGINE 100 UNIT/ML ~~LOC~~ SOLN
80.0000 [IU] | Freq: Every day | SUBCUTANEOUS | Status: DC
  Administered 2018-06-03: 80 [IU] via SUBCUTANEOUS
  Filled 2018-06-03 (×2): qty 0.8

## 2018-06-03 MED ORDER — ZOLPIDEM TARTRATE 5 MG PO TABS: 5.0000 mg | ORAL_TABLET | Freq: Every evening | ORAL | Status: DC | PRN

## 2018-06-03 MED ORDER — FLEET ENEMA 7-19 GM/118ML RE ENEM: 1.0000 | ENEMA | Freq: Once | RECTAL | Status: DC | PRN

## 2018-06-03 MED ORDER — CHLORHEXIDINE GLUCONATE 4 % EX LIQD: 60.0000 mL | Freq: Once | CUTANEOUS | Status: DC

## 2018-06-03 MED ORDER — ALBUTEROL SULFATE (2.5 MG/3ML) 0.083% IN NEBU: 2.5000 mg | INHALATION_SOLUTION | Freq: Four times a day (QID) | RESPIRATORY_TRACT | Status: DC | PRN

## 2018-06-03 MED ORDER — ONDANSETRON HCL 4 MG/2ML IJ SOLN: 4.0000 mg | Freq: Four times a day (QID) | INTRAMUSCULAR | Status: DC | PRN

## 2018-06-03 MED ORDER — 0.9 % SODIUM CHLORIDE (POUR BTL) OPTIME
TOPICAL | Status: DC | PRN
  Administered 2018-06-03: 1000 mL

## 2018-06-03 MED ORDER — BUPIVACAINE IN DEXTROSE 0.75-8.25 % IT SOLN
INTRATHECAL | Status: DC | PRN
  Administered 2018-06-03: 1.8 mL via INTRATHECAL

## 2018-06-03 MED ORDER — ONDANSETRON HCL 4 MG/2ML IJ SOLN
INTRAMUSCULAR | Status: DC | PRN
  Administered 2018-06-03: 4 mg via INTRAVENOUS

## 2018-06-03 MED ORDER — BISACODYL 5 MG PO TBEC: 5.0000 mg | DELAYED_RELEASE_TABLET | Freq: Every day | ORAL | Status: DC | PRN

## 2018-06-03 MED ORDER — ACETAMINOPHEN 500 MG PO TABS
1000.0000 mg | ORAL_TABLET | Freq: Four times a day (QID) | ORAL | Status: AC
  Administered 2018-06-03 – 2018-06-04 (×4): 1000 mg via ORAL
  Filled 2018-06-03 (×4): qty 2

## 2018-06-03 MED ORDER — ACETAMINOPHEN 325 MG PO TABS: 325.0000 mg | ORAL_TABLET | ORAL | Status: DC | PRN

## 2018-06-03 MED ORDER — MESALAMINE 1.2 G PO TBEC
2.4000 g | DELAYED_RELEASE_TABLET | Freq: Two times a day (BID) | ORAL | Status: DC
  Administered 2018-06-03 – 2018-06-04 (×2): 2.4 g via ORAL
  Filled 2018-06-03 (×3): qty 2

## 2018-06-03 MED ORDER — MIDAZOLAM HCL 2 MG/2ML IJ SOLN
INTRAMUSCULAR | Status: AC
  Administered 2018-06-03: 2 mg
  Filled 2018-06-03: qty 2

## 2018-06-03 MED ORDER — PHENYLEPHRINE 40 MCG/ML (10ML) SYRINGE FOR IV PUSH (FOR BLOOD PRESSURE SUPPORT)
PREFILLED_SYRINGE | INTRAVENOUS | Status: AC
  Filled 2018-06-03: qty 10

## 2018-06-03 MED ORDER — BUPIVACAINE LIPOSOME 1.3 % IJ SUSP
20.0000 mL | Freq: Once | INTRAMUSCULAR | Status: DC
  Filled 2018-06-03: qty 20

## 2018-06-03 MED ORDER — TRANEXAMIC ACID-NACL 1000-0.7 MG/100ML-% IV SOLN
1000.0000 mg | INTRAVENOUS | Status: AC
  Administered 2018-06-03: 1000 mg via INTRAVENOUS
  Filled 2018-06-03: qty 100

## 2018-06-03 MED ORDER — PROPOFOL 10 MG/ML IV BOLUS
INTRAVENOUS | Status: AC
  Filled 2018-06-03: qty 40

## 2018-06-03 MED ORDER — LEVOTHYROXINE SODIUM 25 MCG PO TABS
137.0000 ug | ORAL_TABLET | Freq: Every day | ORAL | Status: DC
  Administered 2018-06-03: 137 ug via ORAL
  Filled 2018-06-03: qty 1

## 2018-06-03 MED ORDER — ONDANSETRON HCL 4 MG PO TABS: 4.0000 mg | ORAL_TABLET | Freq: Four times a day (QID) | ORAL | Status: DC | PRN

## 2018-06-03 MED ORDER — MEPERIDINE HCL 50 MG/ML IJ SOLN: 6.2500 mg | INTRAMUSCULAR | Status: DC | PRN

## 2018-06-03 MED ORDER — FENTANYL CITRATE (PF) 100 MCG/2ML IJ SOLN: 50.0000 ug | INTRAMUSCULAR | Status: DC

## 2018-06-03 MED ORDER — FENTANYL CITRATE (PF) 100 MCG/2ML IJ SOLN
INTRAMUSCULAR | Status: AC
  Administered 2018-06-03: 100 ug
  Filled 2018-06-03: qty 2

## 2018-06-03 MED ORDER — BUPROPION HCL ER (XL) 300 MG PO TB24
450.0000 mg | ORAL_TABLET | Freq: Every morning | ORAL | Status: DC
  Administered 2018-06-04: 450 mg via ORAL
  Filled 2018-06-03: qty 1

## 2018-06-03 MED ORDER — CEFAZOLIN SODIUM-DEXTROSE 2-4 GM/100ML-% IV SOLN
2.0000 g | INTRAVENOUS | Status: AC
  Administered 2018-06-03: 2 g via INTRAVENOUS
  Filled 2018-06-03: qty 100

## 2018-06-03 MED ORDER — GABAPENTIN 300 MG PO CAPS
300.0000 mg | ORAL_CAPSULE | Freq: Three times a day (TID) | ORAL | Status: DC
  Administered 2018-06-03 – 2018-06-04 (×4): 300 mg via ORAL
  Filled 2018-06-03 (×4): qty 1

## 2018-06-03 SURGICAL SUPPLY — 56 items
ARTISURF 11M PLY R 6-9CD KNEE (Knees) ×1 IMPLANT
BAG SPEC THK2 15X12 ZIP CLS (MISCELLANEOUS) ×1
BAG ZIPLOCK 12X15 (MISCELLANEOUS) ×2 IMPLANT
BANDAGE ACE 6X5 VEL STRL LF (GAUZE/BANDAGES/DRESSINGS) ×2 IMPLANT
BLADE SAGITTAL 13X1.27X60 (BLADE) ×2 IMPLANT
BLADE SAW SGTL 83.5X18.5 (BLADE) ×2 IMPLANT
BLADE SURG 15 STRL LF DISP TIS (BLADE) ×1 IMPLANT
BLADE SURG 15 STRL SS (BLADE) ×2
BNDG CMPR MED 10X6 ELC LF (GAUZE/BANDAGES/DRESSINGS) ×1
BNDG ELASTIC 6X10 VLCR STRL LF (GAUZE/BANDAGES/DRESSINGS) ×1 IMPLANT
BOWL SMART MIX CTS (DISPOSABLE) ×2 IMPLANT
BSPLAT TIB 5D D CMNT STM RT (Knees) ×1 IMPLANT
CEMENT BONE SIMPLEX SPEEDSET (Cement) ×4 IMPLANT
COMP FEM PERSONA SZ7 RT (Joint) ×2 IMPLANT
COMPONENT FEM PERSONA SZ7 RT (Joint) IMPLANT
COVER SURGICAL LIGHT HANDLE (MISCELLANEOUS) ×2 IMPLANT
COVER WAND RF STERILE (DRAPES) IMPLANT
CUFF TOURN SGL QUICK 34 (TOURNIQUET CUFF) ×2
CUFF TRNQT CYL 34X4X40X1 (TOURNIQUET CUFF) ×1 IMPLANT
DECANTER SPIKE VIAL GLASS SM (MISCELLANEOUS) ×4 IMPLANT
DRAPE INCISE IOBAN 66X45 STRL (DRAPES) ×4 IMPLANT
DRAPE U-SHAPE 47X51 STRL (DRAPES) ×2 IMPLANT
DRSG AQUACEL AG ADV 3.5X10 (GAUZE/BANDAGES/DRESSINGS) ×2 IMPLANT
DURAPREP 26ML APPLICATOR (WOUND CARE) ×4 IMPLANT
ELECT REM PT RETURN 15FT ADLT (MISCELLANEOUS) ×2 IMPLANT
GLOVE BIOGEL M STRL SZ7.5 (GLOVE) ×2 IMPLANT
GLOVE BIOGEL PI IND STRL 7.5 (GLOVE) ×1 IMPLANT
GLOVE BIOGEL PI IND STRL 8.5 (GLOVE) ×2 IMPLANT
GLOVE BIOGEL PI INDICATOR 7.5 (GLOVE) ×1
GLOVE BIOGEL PI INDICATOR 8.5 (GLOVE) ×2
GLOVE SURG ORTHO 8.0 STRL STRW (GLOVE) ×6 IMPLANT
GOWN STRL REUS W/ TWL XL LVL3 (GOWN DISPOSABLE) ×2 IMPLANT
GOWN STRL REUS W/TWL 2XL LVL3 (GOWN DISPOSABLE) ×2 IMPLANT
GOWN STRL REUS W/TWL XL LVL3 (GOWN DISPOSABLE) ×4
HANDPIECE INTERPULSE COAX TIP (DISPOSABLE) ×2
HOOD PEEL AWAY FLYTE STAYCOOL (MISCELLANEOUS) ×6 IMPLANT
MANIFOLD NEPTUNE II (INSTRUMENTS) ×2 IMPLANT
NS IRRIG 1000ML POUR BTL (IV SOLUTION) ×2 IMPLANT
PACK TOTAL KNEE CUSTOM (KITS) ×2 IMPLANT
PROTECTOR NERVE ULNAR (MISCELLANEOUS) ×2 IMPLANT
SET HNDPC FAN SPRY TIP SCT (DISPOSABLE) ×1 IMPLANT
STEM POLY PAT PLY 35M KNEE (Knees) ×1 IMPLANT
STEM TIBIA 5 DEG SZ D R KNEE (Knees) IMPLANT
STRIP CLOSURE SKIN 1/2X4 (GAUZE/BANDAGES/DRESSINGS) ×3 IMPLANT
SUT BONE WAX W31G (SUTURE) ×2 IMPLANT
SUT MNCRL AB 3-0 PS2 18 (SUTURE) ×2 IMPLANT
SUT STRATAFIX 0 PDS 27 VIOLET (SUTURE) ×2
SUT STRATAFIX PDS+ 0 24IN (SUTURE) ×2 IMPLANT
SUT VIC AB 1 CT1 36 (SUTURE) ×2 IMPLANT
SUTURE STRATFX 0 PDS 27 VIOLET (SUTURE) ×1 IMPLANT
SYR CONTROL 10ML LL (SYRINGE) ×4 IMPLANT
TIBIA STEM 5 DEG SZ D R KNEE (Knees) ×2 IMPLANT
TRAY FOLEY MTR SLVR 16FR STAT (SET/KITS/TRAYS/PACK) ×2 IMPLANT
WATER STERILE IRR 1000ML POUR (IV SOLUTION) ×4 IMPLANT
WRAP KNEE MAXI GEL POST OP (GAUZE/BANDAGES/DRESSINGS) ×2 IMPLANT
YANKAUER SUCT BULB TIP 10FT TU (MISCELLANEOUS) ×2 IMPLANT

## 2018-06-03 NOTE — Anesthesia Preprocedure Evaluation (Addendum)
Anesthesia Evaluation  Patient identified by MRN, date of birth, ID band Patient awake    Reviewed: Allergy & Precautions, H&P , NPO status , Patient's Chart, lab work & pertinent test results, reviewed documented beta blocker date and time   History of Anesthesia Complications (+) PONV and history of anesthetic complications  Airway Mallampati: II  TM Distance: >3 FB Neck ROM: full    Dental no notable dental hx.    Pulmonary shortness of breath, former smoker,    Pulmonary exam normal breath sounds clear to auscultation       Cardiovascular Exercise Tolerance: Good hypertension, Pt. on medications and Pt. on home beta blockers +CHF  + Valvular Problems/Murmurs  Rhythm:regular Rate:Normal  Echo 19     Large normal coronary arteries in a dominant RCA system.   Moderate LV dysfunction with diffuse hypocontractility and an ejection fraction of 35-40%.  Findings are compatible with a nonischemic cardiomyopathy.   Neuro/Psych  Headaches, negative psych ROS   GI/Hepatic Neg liver ROS, GERD  ,  Endo/Other  negative endocrine ROSdiabetes, Type 2, Insulin Dependent, Oral Hypoglycemic AgentsHypothyroidism   Renal/GU negative Renal ROS  negative genitourinary   Musculoskeletal  (+) Arthritis , Osteoarthritis,    Abdominal   Peds  Hematology negative hematology ROS (+)   Anesthesia Other Findings   Reproductive/Obstetrics negative OB ROS                            Anesthesia Physical Anesthesia Plan  ASA: III  Anesthesia Plan: Spinal   Post-op Pain Management:  [facility] for Post-op pain   Induction:   PONV Risk Score and Plan: 3 and Ondansetron, Treatment may vary due to age or medical condition, Dexamethasone and Midazolam  Airway Management Planned: Nasal Cannula and Natural Airway  Additional Equipment:   Intra-op Plan:   Post-operative Plan:   Informed Consent: I have reviewed  the patients History and Physical, chart, labs and discussed the procedure including the risks, benefits and alternatives for the proposed anesthesia with the patient or authorized representative who has indicated his/her understanding and acceptance.   Dental Advisory Given  Plan Discussed with: CRNA, Anesthesiologist and Surgeon  Anesthesia Plan Comments: (  )       Anesthesia Quick Evaluation

## 2018-06-03 NOTE — H&P (Signed)
Meagan Allen MRN:  [id number] DOB/SEX:  04-Jul-1956/female  CHIEF COMPLAINT:  Painful right Knee  HISTORY: Patient is a 62 y.o. female presented with a history of pain in the right knee. Onset of symptoms was gradual starting a few years ago with gradually worsening course since that time. Patient has been treated conservatively with over-the-counter NSAIDs and activity modification. Patient currently rates pain in the knee at 10 out of 10 with activity. There is pain at night.  PAST MEDICAL HISTORY: Patient Active Problem List   Diagnosis Date Noted  . Left rotator cuff tear 10/17/2017  . Complete rotator cuff tear 10/17/2017  . Tear of left rotator cuff 10/17/2017  . Nonischemic cardiomyopathy ([place]) 08/22/2017  . Ulcerative colitis ([place]) 08/22/2017  . Abnormal stress test 08/22/2017  . Cardiovascular stress test abnormal 08/22/2017  . Cardiomyopathy ([place]) 08/22/2017  . Postsurgical hypothyroidism 08/10/2017  . Esophageal reflux 08/10/2017  . Type 2 diabetes mellitus with diabetic polyneuropathy ([place]) 08/10/2017  . Hepatic steatosis 08/10/2017  . Palpitations 08/10/2017  . Pure hypercholesterolemia 08/10/2017  . Mixed hyperlipidemia 08/10/2017  . Gastroesophageal reflux disease 08/10/2017  . Postoperative hypothyroidism 08/10/2017  . Full thickness rotator cuff tear 08/06/2017  . Resting tremor 03/03/2013  . RUQ pain 03/03/2013  . Hx of thyroid cancer 03/03/2013  . Depression with anxiety 03/03/2013  . History of malignant neoplasm of thyroid 03/03/2013  . Right upper quadrant pain 03/03/2013  . HTN (hypertension) 03/25/2012   Past Medical History:  Diagnosis Date  . Anemia   . Anxiety   . Arthritis    osteo  . Cancer [facility])    thyroid cancer  . CHF (congestive heart failure) ([place])    diagnosed feb 2019  . Depression   . Dyspnea   . Fibromyalgia   . GERD (gastroesophageal reflux disease)   . Hearing loss of right ear   . Heart murmur   . History of colon polyps    . History of thyroid cancer 2007;  2009   dx papillary thyroid cancer w/ mets to cervical lymph nodes  . Hyperlipidemia   . Hypertension   . IBS (irritable bowel syndrome)   . Left rotator cuff tear   . Migraine   . Nonischemic cardiomyopathy [facility]) cardiologist-  dr Edyth Gunnels   per cardiac cath 08-28-2017  moderate LV dysfunction w/ diffuse hypocontractility ,  ef 35-40%  . 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 PONV (postoperative nausea and vomiting)    last colonoscopy propfol bp dropped and vomiting  . Post-surgical hypothyroidism   . Rosacea   . Type 2 diabetes mellitus ([place])    followed by pcp  . 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[place])    Past Surgical History:  Procedure Laterality Date  . CARDIOVASCULAR STRESS TEST  08-20-2017   dr Agustin Cree   High risk nuclear study w/ large irreverisible defect in the basal and mid inferoseptal, inferior, inferolateral walls and apical septal, inferior walls with small peri infarct ischemia in the apical anterior & lateral walls (consistant w/ prior MI )/  no ST segment deviation noted /  nuclear stress ef 36%/  recommended cardiac cath  . CARPOMETACARPAL ([place]) FUSION OF THUMB Bilateral 2003  . COLONOSCOPY WITH PROPOFOL N/A 05/06/2013   Procedure: COLONOSCOPY WITH PROPOFOL;  Surgeon: Garlan Fair, MD;  Location: [facility] END[facility];  Service: Endoscopy;  Laterality: N/A;  . D & C HYSTEROSCOPY /  RESECTION POLYP/ ROLLER  BALL ABLATION  02-26-2004   dr Kathyrn Drown  [facility]  . DILATION AND CURETTAGE OF UTERUS  1984  . EXCISION MORTON'S NEUROMA Left 1996  . giant cell tumor  2011   Left ankle 2011  . KNEE ARTHROSCOPY  08-16-2010  dr Beola Cord  [facility];  05/ 2012   right x2 left x1  . LEFT HEART CATH AND CORONARY ANGIOGRAPHY N/A 08/28/2017   Procedure: LEFT HEART CATH AND CORONARY ANGIOGRAPHY;  Surgeon: Troy Sine, MD;  Location: [place] CV LAB;  Service: Cardiovascular;   Laterality: N/A;   large normal coronary arteries in a dominant RCA system;  moderate LV systoic dysfunction w/ diffuse hypocontractility (compatible w/ nonischemic cardiomyopathy), LV end diastoilc pressure normal, LVEF 35-45% by visual estimate  . NASAL LACRIMAL DUCT SURGERY  06/14/2009  . REDO RIGHT MODIFIED RADICAL NECK DISSECTION  08-29-2007    [facility]  . SHOULDER ARTHROSCOPY WITH ROTATOR CUFF REPAIR AND SUBACROMIAL DECOMPRESSION Left 10/17/2017   Procedure: Left shoulder mini open rotator cuff repair, subacromial decompression;  Surgeon: [name], MD;  Location: [facility] ORS;  Service: Orthopedics;  Laterality: Left;  Interscalene Block  . SHOULDER SURGERY     Left mini- open rotator cuff repair Dr. Tonita Cong 10-17-17  . TOTAL THYROIDECTOMY  2007  -- [facility]   w/ dissection lymph nodes  . TRANSTHORACIC ECHOCARDIOGRAM  08-20-2017  dr Agustin Cree   ef 25-30%, severe diffuse hypokinesis with no identifiable regional variations, but with profound dyssynchrony, due to arrhythmia insuffient to evaluate LV diastolic dysfunction/  trivial MR/  mild LAE/ Ventricular septum motion abnormal funtion,dyssynergy, & paradox  . WRIST SURGERY Right 2001     MEDICATIONS:   No medications prior to admission.    ALLERGIES:   Allergies  Allergen Reactions  . Sulfa Antibiotics Hives and Nausea Only  . Bydureon [Exenatide] Other (See Comments)    Yeast issues.  Anastasio Auerbach [Canagliflozin] Other (See Comments)    Yeast issues.   Vania Rea [Empagliflozin] Other (See Comments)    Yeast issues.   . Lisinopril Other (See Comments)    Pt is unsure of exact reaction  . Pravastatin Other (See Comments)    Body pain.  Marland Kitchen Propofol Nausea And Vomiting and Other (See Comments)    Blood pressure bottomed out/syncope    REVIEW OF SYSTEMS:  A comprehensive review of systems was negative except for: Musculoskeletal: positive for arthralgias and bone pain   FAMILY HISTORY:   Family History  Problem Relation Age of Onset   . Heart disease Father        MI died age 6  . Hypertension Father   . Diabetes Father   . Arthritis Mother        rheumatoid athritis  . Stroke Mother   . Depression Mother   . Gout Brother   . Cancer Brother        thyroid CA  . Gout Brother   . Gout Brother   . Heart disease Sister        75 months old  . Asthma Son     SOCIAL HISTORY:   Social History   Tobacco Use  . Smoking status: Former Smoker    Packs/day: 3.00    Years: 10.00    Pack years: 30.00    Types: Cigarettes    Last attempt to quit: 07/17/1980    Years since quitting: 37.9  . Smokeless tobacco: Never Used  Substance Use Topics  . Alcohol use: No    Comment: Rare  use     EXAMINATION:  Vital signs in last 24 hours:    There were no vitals taken for this visit.  General Appearance:    Alert, cooperative, no distress, appears stated age  Head:    Normocephalic, without obvious abnormality, atraumatic  Eyes:    PERRL, conjunctiva/corneas clear, EOM's intact, fundi    benign, both eyes  Ears:    Normal TM's and external ear canals, both ears  Nose:   Nares normal, septum midline, mucosa normal, no drainage    or sinus tenderness  Throat:   Lips, mucosa, and tongue normal; teeth and gums normal  Neck:   Supple, symmetrical, trachea midline, no adenopathy;    thyroid:  no enlargement/tenderness/nodules; no carotid   bruit or JVD  Back:     Symmetric, no curvature, ROM normal, no CVA tenderness  Lungs:     Clear to auscultation bilaterally, respirations unlabored  Chest Wall:    No tenderness or deformity   Heart:    Regular rate and rhythm, S1 and S2 normal, no murmur, rub   or gallop  Breast Exam:    No tenderness, masses, or nipple abnormality  Abdomen:     Soft, non-tender, bowel sounds active all four quadrants,    no masses, no organomegaly  Genitalia:    Normal female without lesion, discharge or tenderness  Rectal:    Normal tone, normal prostate, no masses or tenderness;   guaiac  negative stool  Extremities:   Extremities normal, atraumatic, no cyanosis or edema  Pulses:   2+ and symmetric all extremities  Skin:   Skin color, texture, turgor normal, no rashes or lesions  Lymph nodes:   Cervical, supraclavicular, and axillary nodes normal  Neurologic:   CNII-XII intact, normal strength, sensation and reflexes    throughout    Musculoskeletal:  ROM 0-120, Ligaments intact,  Imaging Review Plain radiographs demonstrate severe degenerative joint disease of the right knee. The overall alignment is neutral. The bone quality appears to be good for age and reported activity level.  Assessment/Plan: Primary osteoarthritis, right knee   The patient history, physical examination and imaging studies are consistent with advanced degenerative joint disease of the right knee. The patient has failed conservative treatment.  The clearance notes were reviewed.  After discussion with the patient it was felt that Total Knee Replacement was indicated. The procedure,  risks, and benefits of total knee arthroplasty were presented and reviewed. The risks including but not limited to aseptic loosening, infection, blood clots, vascular injury, stiffness, patella tracking problems complications among others were discussed. The patient acknowledged the explanation, agreed to proceed with the plan.  Preoperative templating of the joint replacement has been completed, documented, and submitted to the Operating Room personnel in order to optimize intra-operative equipment management.    Patient's anticipated LOS is less than 2 midnights, meeting these requirements: - Younger than 75 - Lives within 1 hour of care - Has a competent adult at home to recover with post-op recover - NO history of  - Chronic pain requiring opiods  - Diabetes  - Coronary Artery Disease  - Heart failure  - Heart attack  - Stroke  - DVT/VTE  - Cardiac arrhythmia  - Respiratory Failure/COPD  - Renal failure  -  Anemia  - Advanced Liver disease       Donia Ast 06/03/2018, 6:20 AM

## 2018-06-03 NOTE — Evaluation (Signed)
Physical Therapy Evaluation Patient Details Name: Meagan Allen MRN: [id number] DOB: 06/27/1956 Today's Date: 06/03/2018   History of Present Illness  62 yo female s/p R TKR on 06/03/18. 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   Clinical Impression   Pt presents with R knee pain with mobility, difficulty performing mobility tasks, decreased R knee ROM, some unsteadiness with ambulation (limited to room-distance ambulation, recliner placed close to pt for safety), and decreased tolerance for ambulation due to pain. Pt to benefit from acute PT to address deficits. Pt ambulated 15 ft in room with min guard to min assist for R knee guarding and safety. Pt educated on quad sets (5-10/hour), ankle pumps (20/hour), and heel slides (5-10/hour) to perform this afternoon/evening to lessen stiffness and increase circulation, to pt's tolerance and limited by pain. PT to progress mobility as tolerated, and will continue to follow acutely.      Follow Up Recommendations Follow surgeon's recommendation for DC plan and follow-up therapies;Supervision for mobility/OOB(HHPT)    Equipment Recommendations  None recommended by PT    Recommendations for Other Services       Precautions / Restrictions Precautions Precautions: Fall Restrictions Weight Bearing Restrictions: No Other Position/Activity Restrictions: WBAT       Mobility  Bed Mobility Overal bed mobility: Needs Assistance Bed Mobility: Supine to Sit     Supine to sit: Min guard;HOB elevated     General bed mobility comments: Min guard for safety. Verbal cuing for sequencing, increased time to perform.   Transfers Overall transfer level: Needs assistance Equipment used: Rolling walker (2 wheeled) Transfers: Sit to/from Stand Sit to Stand: From elevated surface;Min  assist         General transfer comment: Min assist for power up, increased time to rise. Verbal cuing for hand placement. Pt with no buckling with weight shifts.   Ambulation/Gait Ambulation/Gait assistance: Min assist;Min guard Gait Distance (Feet): 15 Feet Assistive device: Rolling walker (2 wheeled) Gait Pattern/deviations: Step-to pattern;Decreased stride length;Decreased weight shift to right;Decreased stance time - right Gait velocity: decr    General Gait Details: Min assist initially for guarding of RLE. Pt stating R foot felt "not right" which she described as tingling, limited ambulation to room distance only. Pt with mild R knee buckling upon first step, pt-corrected. Pt encouraged to offweight RLE with use of RW, and pt's ambulation improved with no more buckling with forward ambulation. Min assist to min guard, for R knee guarding and safety. Verbal cuing for sequencing and placement in RW. Pt with mild R knee buckling with backing up to recliner, pt-corrected.   Stairs            Wheelchair Mobility    Modified Rankin (Stroke Patients Only)       Balance Overall balance assessment: Mild deficits observed, not formally tested                                           Pertinent Vitals/Pain Pain Assessment: 0-10 Pain Score: 2  Pain Location: R knee, increased with mobility  Pain Descriptors / Indicators: Sore Pain Intervention(s): Limited activity within patient's tolerance;Repositioned;Monitored during session;Premedicated before session;Ice applied    Home Living Family/patient expects to be discharged to:: Private residence Living Arrangements: Spouse/significant other  Available Help at Discharge: Family;Available PRN/intermittently Type of Home: House Home Access: Stairs to enter Entrance Stairs-Rails: None Entrance Stairs-Number of Steps: 3 Home Layout: One level Home Equipment: Cane - single point;Walker - 2 wheels      Prior  Function Level of Independence: Independent               Hand Dominance   Dominant Hand: Right    Extremity/Trunk Assessment   Upper Extremity Assessment Upper Extremity Assessment: Overall WFL for tasks assessed    Lower Extremity Assessment Lower Extremity Assessment: RLE deficits/detail;Overall WFL for tasks assessed RLE Deficits / Details: suspected R knee weakness; able to perform quad set, ankle pump, SLR with no quad lag  RLE Sensation: decreased light touch(mild tingling sensation of R foot)    Cervical / Trunk Assessment Cervical / Trunk Assessment: Normal  Communication   Communication: No difficulties  Cognition Arousal/Alertness: Awake/alert Behavior During Therapy: WFL for tasks assessed/performed Overall Cognitive Status: Within Functional Limits for tasks assessed                                        General Comments      Exercises Total Joint Exercises Goniometric ROM: knee aarom ~5-80*, limited by pain    Assessment/Plan    PT Assessment Patient needs continued PT services  PT Problem List Decreased strength;Pain;Decreased range of motion;Decreased activity tolerance;Decreased knowledge of use of DME;Decreased balance;Decreased safety awareness;Decreased mobility       PT Treatment Interventions Therapeutic activities;DME instruction;Gait training;Therapeutic exercise;Patient/family education;Stair training;Balance training;Functional mobility training    PT Goals (Current goals can be found in the Care Plan section)  Acute Rehab PT Goals PT Goal Formulation: With patient Time For Goal Achievement: 06/10/18 Potential to Achieve Goals: Good    Frequency 7X/week   Barriers to discharge        Co-evaluation               AM-PAC PT "6 Clicks" Daily Activity  Outcome Measure Difficulty turning over in bed (including adjusting bedclothes, sheets and blankets)?: Unable Difficulty moving from lying on back to  sitting on the side of the bed? : Unable Difficulty sitting down on and standing up from a chair with arms (e.g., wheelchair, bedside commode, etc,.)?: Unable Help needed moving to and from a bed to chair (including a wheelchair)?: A Little Help needed walking in hospital room?: A Little Help needed climbing 3-5 steps with a railing? : A Little 6 Click Score: 12    End of Session Equipment Utilized During Treatment: Gait belt Activity Tolerance: Patient tolerated treatment well Patient left: in chair;with chair alarm set;with call bell/phone within reach;with SCD's reapplied Nurse Communication: Mobility status PT Visit Diagnosis: Other abnormalities of gait and mobility (R26.89);Difficulty in walking, not elsewhere classified (R26.2)    Time: 8280-0349 PT Time Calculation (min) (ACUTE ONLY): 35 min   Charges:   PT Evaluation $PT Eval Low Complexity: 1 Low PT Treatments $Gait Training: 8-22 mins        Julien Girt, PT Acute Rehabilitation Services Pager [phone]  Office [phone]  Eoin Willden D Elonda Husky 06/03/2018, 5:43 PM

## 2018-06-03 NOTE — Anesthesia Postprocedure Evaluation (Signed)
Anesthesia Post Note  Patient: DESHAWNA MCNEECE  Procedure(s) Performed: RIGHT TOTAL KNEE ARTHROPLASTY (Right Knee)     Patient location during evaluation: PACU Anesthesia Type: Spinal Level of consciousness: oriented and awake and alert Pain management: pain level controlled Vital Signs Assessment: post-procedure vital signs reviewed and stable Respiratory status: spontaneous breathing, respiratory function stable and patient connected to nasal cannula oxygen Cardiovascular status: blood pressure returned to baseline and stable Postop Assessment: no headache, no backache and no apparent nausea or vomiting Anesthetic complications: no    Last Vitals:  Vitals:   06/03/18 1145 06/03/18 1200  BP: (!) 108/52   Pulse: 63 69  Resp: 12 12  Temp:    SpO2: 98% 97%    Last Pain:  Vitals:   06/03/18 1130  TempSrc:   PainSc: 0-No pain    LLE Motor Response: Purposeful movement (06/03/18 1200)   RLE Motor Response: Purposeful movement (06/03/18 1200)     R Sensory Level: S1-Sole of foot, small toes (06/03/18 1200)  Mitzi Lilja

## 2018-06-03 NOTE — Anesthesia Procedure Notes (Signed)
Anesthesia Regional Block: Adductor canal block   Pre-Anesthetic Checklist: ,, timeout performed, Correct Patient, Correct Site, Correct Laterality, Correct Procedure, Correct Position, site marked, Risks and benefits discussed,  Surgical consent,  Pre-op evaluation,  At surgeon's request and post-op pain management  Laterality: Right  Prep: chloraprep       Needles:  Injection technique: Single-shot  Needle Type: Echogenic Stimulator Needle     Needle Length: 5cm  Needle Gauge: 22     Additional Needles:   Procedures:, nerve stimulator,,, ultrasound used (permanent image in chart),,,,  Narrative:  Start time: 06/03/2018 9:10 AM End time: 06/03/2018 9:20 AM Injection made incrementally with aspirations every 5 mL.  Performed by: Personally  Anesthesiologist: Janeece Riggers, MD  Additional Notes: Functioning IV was confirmed and monitors were applied.  A 68m 22ga Arrow echogenic stimulator needle was used. Sterile prep and drape,hand hygiene and sterile gloves were used. Ultrasound guidance: relevant anatomy identified, needle position confirmed, local anesthetic spread visualized around nerve(s)., vascular puncture avoided.  Image printed for medical record. Negative aspiration and negative test dose prior to incremental administration of local anesthetic. The patient tolerated the procedure well.

## 2018-06-03 NOTE — Anesthesia Procedure Notes (Signed)
Spinal  Patient location during procedure: OR Start time: 06/03/2018 9:37 AM End time: 06/03/2018 9:39 AM Staffing Anesthesiologist: Janeece Riggers, MD Resident/CRNA: Lind Covert, CRNA Performed: resident/CRNA  Preanesthetic Checklist Completed: patient identified, site marked, surgical consent, pre-op evaluation, timeout performed, IV checked, risks and benefits discussed and monitors and equipment checked Spinal Block Patient position: sitting Prep: DuraPrep Patient monitoring: heart rate, cardiac monitor, continuous pulse ox and blood pressure Approach: midline Location: L3-4 Injection technique: single-shot Needle Needle type: Pencan  Needle gauge: 24 G Needle length: 10 cm Needle insertion depth: 7 cm Assessment Sensory level: T6 Additional Notes Timeout performed. SAB kit date checked. SAB without difficulty.

## 2018-06-03 NOTE — Transfer of Care (Signed)
Immediate Anesthesia Transfer of Care Note  Patient: ANIKAH HOGGE  Procedure(s) Performed: RIGHT TOTAL KNEE ARTHROPLASTY (Right Knee)  Patient Location: PACU  Anesthesia Type:Regional and Spinal  Level of Consciousness: sedated  Airway & Oxygen Therapy: Patient Spontanous Breathing and Patient connected to face mask oxygen  Post-op Assessment: Report given to RN and Post -op Vital signs reviewed and stable  Post vital signs: Reviewed and stable  Last Vitals:  Vitals Value Taken Time  BP    Temp    Pulse 67 06/03/2018 11:03 AM  Resp 13 06/03/2018 11:03 AM  SpO2 93 % 06/03/2018 11:03 AM  Vitals shown include unvalidated device data.  Last Pain:  Vitals:   06/03/18 0922  TempSrc:   PainSc: 0-No pain      Patients Stated Pain Goal: 4 (03/55/97 4163)  Complications: No apparent anesthesia complications

## 2018-06-04 ENCOUNTER — Encounter (HOSPITAL_COMMUNITY): Payer: Self-pay | Admitting: Orthopedic Surgery

## 2018-06-04 DIAGNOSIS — M1711 Unilateral primary osteoarthritis, right knee: Secondary | ICD-10-CM | POA: Diagnosis not present

## 2018-06-04 LAB — GLUCOSE, CAPILLARY
Glucose-Capillary: 123 mg/dL — ABNORMAL HIGH (ref 70–99)
Glucose-Capillary: 152 mg/dL — ABNORMAL HIGH (ref 70–99)

## 2018-06-04 LAB — CBC
HCT: 29.6 % — ABNORMAL LOW (ref 36.0–46.0)
Hemoglobin: 8.9 g/dL — ABNORMAL LOW (ref 12.0–15.0)
MCH: 27.6 pg (ref 26.0–34.0)
MCHC: 30.1 g/dL (ref 30.0–36.0)
MCV: 91.9 fL (ref 80.0–100.0)
Platelets: 309 10*3/uL (ref 150–400)
RBC: 3.22 MIL/uL — ABNORMAL LOW (ref 3.87–5.11)
RDW: 16.2 % — ABNORMAL HIGH (ref 11.5–15.5)
WBC: 9.6 10*3/uL (ref 4.0–10.5)
nRBC: 0 % (ref 0.0–0.2)

## 2018-06-04 LAB — BASIC METABOLIC PANEL
Anion gap: 9 (ref 5–15)
BUN: 14 mg/dL (ref 8–23)
CO2: 24 mmol/L (ref 22–32)
Calcium: 8.9 mg/dL (ref 8.9–10.3)
Chloride: 108 mmol/L (ref 98–111)
Creatinine, Ser: 0.91 mg/dL (ref 0.44–1.00)
GFR calc Af Amer: >60 mL/min (ref >60)
GFR calc non Af Amer: >60 mL/min (ref >60)
Glucose, Bld: 172 mg/dL — ABNORMAL HIGH (ref 70–99)
Potassium: 4 mmol/L (ref 3.5–5.1)
Sodium: 141 mmol/L (ref 135–145)

## 2018-06-04 MED ORDER — ASPIRIN 325 MG PO TBEC: 325.0000 mg | DELAYED_RELEASE_TABLET | Freq: Two times a day (BID) | ORAL | 0 refills | Status: DC

## 2018-06-04 MED ORDER — OXYCODONE HCL 5 MG PO TABS: 5.0000 mg | ORAL_TABLET | Freq: Four times a day (QID) | ORAL | 0 refills | Status: DC | PRN

## 2018-06-04 MED ORDER — METHOCARBAMOL 500 MG PO TABS: 500.0000 mg | ORAL_TABLET | Freq: Four times a day (QID) | ORAL | 0 refills | Status: DC | PRN

## 2018-06-04 MED ORDER — ACETAMINOPHEN 500 MG PO TABS: 1000.0000 mg | ORAL_TABLET | Freq: Four times a day (QID) | ORAL | 0 refills | Status: DC

## 2018-06-04 NOTE — Progress Notes (Signed)
SPORTS MEDICINE AND JOINT REPLACEMENT  Lara Mulch, MD    Carlyon Shadow, PA-C [place], [place], [place]  [address]                             [phone]   PROGRESS NOTE  Subjective:  negative for Chest Pain  negative for Shortness of Breath  negative for Nausea/Vomiting   negative for Calf Pain  negative for Bowel Movement   Tolerating Diet: yes         Patient reports pain as 3 on 0-10 scale.    Objective: Vital signs in last 24 hours:    Patient Vitals for the past 24 hrs:  BP Temp Temp src Pulse Resp SpO2 Height Weight  06/04/18 0551 (!) 94/52 98 F (36.7 C) Oral 70 16 95 % - -  06/04/18 0214 (!) 94/51 (!) 97.4 F (36.3 C) Oral 71 17 95 % - -  06/03/18 2224 (!) 107/59 98.3 F (36.8 C) Oral 79 - 97 % - -  06/03/18 2010 - - - 74 16 - - -  06/03/18 1841 117/75 98.2 F (36.8 C) - 76 16 98 % - -  06/03/18 1624 118/65 98.4 F (36.9 C) - 73 18 97 % - -  06/03/18 1536 111/60 97.8 F (36.6 C) Oral 67 16 99 % - -  06/03/18 1415 (!) 122/55 97.6 F (36.4 C) - 66 16 100 % - -  06/03/18 1249 120/62 97.6 F (36.4 C) Oral 72 14 99 % - -  06/03/18 1225 106/63 98.1 F (36.7 C) - 64 12 97 % - -  06/03/18 1215 (!) 97/57 - - 65 12 98 % - -  06/03/18 1200 (!) 113/57 - - 69 12 97 % - -  06/03/18 1145 (!) 108/52 - - 63 12 98 % - -  06/03/18 1130 (!) 102/59 - - 62 13 100 % - -  06/03/18 1115 (!) 102/53 - - 66 12 100 % - -  06/03/18 1104 (!) 95/52 97.8 F (36.6 C) - 67 13 98 % - -  06/03/18 0924 - - - 66 - 99 % - -  06/03/18 0923 - - - 67 12 99 % - -  06/03/18 0922 - - - 68 12 98 % - -  06/03/18 0921 - - - 70 10 99 % - -  06/03/18 0920 135/70 - - 66 (!) 9 97 % - -  06/03/18 0919 - - - 64 (!) 8 99 % - -  06/03/18 0918 - - - 70 (!) 7 97 % - -  06/03/18 0917 - - - 84 13 98 % - -  06/03/18 0916 - - - 72 12 99 % - -  06/03/18 0915 136/72 - - 70 19 100 % - -  06/03/18 0914 - - - 71 20 99 % - -  06/03/18 0913 - - - 76 19 98 % - -  06/03/18 0912 - - - 77 17 97 % - -   06/03/18 0845 - - - - - - 5' 6.5" (1.689 m) 100.8 kg  06/03/18 0833 133/76 98.6 F (37 C) Oral 75 18 98 % - -    @flow {1959:LAST@   Intake/Output from previous day:   11/18 0701 - 11/19 0700 In: 2438 [P.O.:700; I.V.:1438] Out: 1895 [Urine:1875]   Intake/Output this shift:   No intake/output data recorded.   Intake/Output  11/18 0701 - 11/19 0700 11/19 0701 - 11/20 0700   P.O. 700    I.V. (mL/kg) 1438 (14.3)    Other 100    IV Piggyback 200    Total Intake(mL/kg) 2438 (24.2)    Urine (mL/kg/hr) 1875    Blood 20    Total Output 1895    Net +543            LABORATORY DATA: Recent Labs    06/04/18 0427  WBC 9.6  HGB 8.9*  HCT 29.6*  PLT 309   Recent Labs    06/04/18 0427  NA 141  K 4.0  CL 108  CO2 24  BUN 14  CREATININE 0.91  GLUCOSE 172*  CALCIUM 8.9   Lab Results  Component Value Date   INR 0.9 08/22/2017    Examination:  General appearance: alert, cooperative and no distress Extremities: extremities normal, atraumatic, no cyanosis or edema  Wound Exam: clean, dry, intact   Drainage:  None: wound tissue dry  Motor Exam: Quadriceps and Hamstrings Intact  Sensory Exam: Superficial Peroneal, Deep Peroneal and Tibial normal   Assessment:    1 Day Post-Op  Procedure(s) (LRB): RIGHT TOTAL KNEE ARTHROPLASTY (Right)  ADDITIONAL DIAGNOSIS:  Active Problems:   S/P total knee replacement  Patient doing well, expcted D/C home today   Plan: Physical Therapy as ordered Weight Bearing as Tolerated (WBAT)  DVT Prophylaxis:  Aspirin  DISCHARGE PLAN: Home  DISCHARGE NEEDS: HHPT       Patient's anticipated LOS is less than 2 midnights, meeting these requirements: - Younger than 16 - Lives within 1 hour of care - Has a competent adult at home to recover with post-op recover - NO history of  - Chronic pain requiring opiods  - Diabetes  - Coronary Artery Disease  - Heart failure  - Heart attack  - Stroke  - DVT/VTE  - Cardiac  arrhythmia  - Respiratory Failure/COPD  - Renal failure  - Anemia  - Advanced Liver disease        Donia Ast 06/04/2018, 7:04 AM

## 2018-06-04 NOTE — Progress Notes (Signed)
Physical Therapy Treatment Patient Details Name: Meagan Allen MRN: [id number] DOB: [birth year] Today's Date: 06/04/2018    History of Present Illness 62 yo female s/p R TKR on 06/03/18. 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     PT Comments    POD # 1 am session Assisted OOB to amb an increased distance then assisted back to bed and placed in CPM.     Follow Up Recommendations  Follow surgeon's recommendation for DC plan and follow-up therapies;Supervision for mobility/OOB(HH PT)     Equipment Recommendations  None recommended by PT    Recommendations for Other Services       Precautions / Restrictions Precautions Precautions: Fall;Knee Precaution Comments: reviewed "no pillow under knee" Restrictions Weight Bearing Restrictions: No Other Position/Activity Restrictions: WBAT     Mobility  Bed Mobility Overal bed mobility: Needs Assistance Bed Mobility: Supine to Sit     Supine to sit: Min guard;HOB elevated     General bed mobility comments: Min guard for safety. Verbal cuing for sequencing, increased time to perform.   Transfers Overall transfer level: Needs assistance Equipment used: Rolling walker (2 wheeled) Transfers: Sit to/from Stand Sit to Stand: From elevated surface;Min assist         General transfer comment: 25% VC's on safety with turns and to extend LE prior to sit  Ambulation/Gait Ambulation/Gait assistance: Supervision;Min guard Gait Distance (Feet): 45 Feet Assistive device: Rolling walker (2 wheeled) Gait Pattern/deviations: Step-to pattern;Decreased stride length;Decreased weight shift to right;Decreased stance time - right Gait velocity: decr    General Gait Details: 255 VC's on safety with turns and proper walker to self distance.     Stairs              Wheelchair Mobility    Modified Rankin (Stroke Patients Only)       Balance                                            Cognition Arousal/Alertness: Awake/alert Behavior During Therapy: WFL for tasks assessed/performed Overall Cognitive Status: Within Functional Limits for tasks assessed                                        Exercises      General Comments        Pertinent Vitals/Pain Pain Assessment: 0-10 Pain Score: 5  Pain Location: R knee, increased with mobility  Pain Descriptors / Indicators: Grimacing;Sore;Operative site guarding Pain Intervention(s): Premedicated before session;Monitored during session;Repositioned;Ice applied    Home Living                      Prior Function            PT Goals (current goals can now be found in the care plan section) Progress towards PT goals: Progressing toward goals    Frequency    7X/week      PT Plan Current plan remains appropriate    Co-evaluation              AM-PAC PT "6 Clicks" Daily Activity  Outcome Measure  Difficulty turning over in bed (including adjusting  bedclothes, sheets and blankets)?: A Lot Difficulty moving from lying on back to sitting on the side of the bed? : A Lot Difficulty sitting down on and standing up from a chair with arms (e.g., wheelchair, bedside commode, etc,.)?: A Lot Help needed moving to and from a bed to chair (including a wheelchair)?: A Lot Help needed walking in hospital room?: A Lot Help needed climbing 3-5 steps with a railing? : A Lot 6 Click Score: 12    End of Session Equipment Utilized During Treatment: Gait belt Activity Tolerance: Patient tolerated treatment well Patient left: in chair;with chair alarm set;with call bell/phone within reach;with SCD's reapplied Nurse Communication: Mobility status PT Visit Diagnosis: Other abnormalities of gait and mobility (R26.89);Difficulty in walking, not elsewhere  classified (R26.2)     Time: 1110-1135 PT Time Calculation (min) (ACUTE ONLY): 25 min  Charges:  $Gait Training: 8-22 mins $Therapeutic Activity: 8-22 mins                     Rica Koyanagi  PTA Acute  Rehabilitation Services Pager      [phone] Office      [phone]

## 2018-06-04 NOTE — Plan of Care (Signed)
Plan of care reviewed and discussed with patient and spouse.  Questions answered to their satisfaction.

## 2018-06-04 NOTE — Progress Notes (Signed)
Physical Therapy Treatment Patient Details Name: Meagan Allen MRN: [id number] DOB: Jul 19, 1955 Today's Date: 06/04/2018    History of Present Illness 62 yo female s/p R TKR on 06/03/18. 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     PT Comments    POD # 1 pm session Assisted OOB to amb a greater distance in hallway, practiced stairs, assisted to bathroom then performed all TKR TE's following HEP handout.  Instructed on proper tech, freq as well as use of ICE.   Follow Up Recommendations  Follow surgeon's recommendation for DC plan and follow-up therapies;Supervision for mobility/OOB(HH PT)     Equipment Recommendations  None recommended by PT    Recommendations for Other Services       Precautions / Restrictions Precautions Precautions: Fall;Knee Precaution Comments: reviewed "no pillow under knee" Restrictions Weight Bearing Restrictions: No Other Position/Activity Restrictions: WBAT     Mobility  Bed Mobility Overal bed mobility: Needs Assistance Bed Mobility: Supine to Sit     Supine to sit: Min guard;HOB elevated     General bed mobility comments: Min guard for safety. Verbal cuing for sequencing, increased time to perform.   Transfers Overall transfer level: Needs assistance Equipment used: Rolling walker (2 wheeled) Transfers: Sit to/from Stand Sit to Stand: From elevated surface;Min assist         General transfer comment: 25% VC's on safety with turns and to extend LE prior to sit  Ambulation/Gait Ambulation/Gait assistance: Supervision;Min guard Gait Distance (Feet): 75 Feet Assistive device: Rolling walker (2 wheeled) Gait Pattern/deviations: Step-to pattern;Decreased stride length;Decreased weight shift to right;Decreased stance time - right Gait velocity: decr    General Gait  Details: 255 VC's on safety with turns and proper walker to self distance.     Stairs Stairs: Yes Stairs assistance: Min guard;Min assist Stair Management: No rails;One rail Right;Step to pattern;Backwards;Forwards;With walker Number of Stairs: 8 General stair comments: 2 different situations.  1. up backward with walker no rail   2. up forward/partial sideway one R rail all with 50% VC's on proper sequencing and safety.  Handout also given.   Wheelchair Mobility    Modified Rankin (Stroke Patients Only)       Balance                                            Cognition Arousal/Alertness: Awake/alert Behavior During Therapy: WFL for tasks assessed/performed Overall Cognitive Status: Within Functional Limits for tasks assessed                                        Exercises   Total Knee Replacement TE's 10 reps B LE ankle pumps 10 reps towel squeezes 10 reps knee presses 10 reps heel slides  10 reps SAQ's 10 reps SLR's 10 reps ABD Sitting TE's 5 reps  Followed by ICE     General Comments        Pertinent Vitals/Pain Pain Assessment: 0-10 Pain Score: 5  Pain Location: R knee, increased with mobility  Pain Descriptors / Indicators: Grimacing;Sore;Operative site guarding Pain Intervention(s): Premedicated before session;Monitored during session;Repositioned;Ice applied    Home Living  Prior Function            PT Goals (current goals can now be found in the care plan section) Progress towards PT goals: Progressing toward goals    Frequency    7X/week      PT Plan Current plan remains appropriate    Co-evaluation              AM-PAC PT "6 Clicks" Daily Activity  Outcome Measure  Difficulty turning over in bed (including adjusting bedclothes, sheets and blankets)?: A Lot Difficulty moving from lying on back to sitting on the side of the bed? : A Lot Difficulty sitting down on  and standing up from a chair with arms (e.g., wheelchair, bedside commode, etc,.)?: A Lot Help needed moving to and from a bed to chair (including a wheelchair)?: A Lot Help needed walking in hospital room?: A Lot Help needed climbing 3-5 steps with a railing? : A Lot 6 Click Score: 12    End of Session Equipment Utilized During Treatment: Gait belt Activity Tolerance: Patient tolerated treatment well Patient left: in chair;with chair alarm set;with call bell/phone within reach;with SCD's reapplied Nurse Communication: Mobility status PT Visit Diagnosis: Other abnormalities of gait and mobility (R26.89);Difficulty in walking, not elsewhere classified (R26.2)     Time: 1435-1520 PT Time Calculation (min) (ACUTE ONLY): 45 min  Charges:  $Gait Training: 8-22 mins $Therapeutic Exercise: 8-22 mins $Therapeutic Activity: 8-22 mins                     {Aleayah Chico  PTA Acute  Rehabilitation Services Pager      [phone] Office      [phone]

## 2018-06-04 NOTE — Care Management Note (Signed)
Case Management Note  Patient Details  Name: Meagan Allen MRN: [id number] Date of Birth: 1956-02-24  Subjective/Objective:    Discharge planning, spoke with patient at bedside. Have chosen [facility] for [facility], evaluate and treat.   Action/Plan: Contacted [facility] for referral. Needs a 3n1, contacted [facility] to deliver to room. [phone]                Expected Discharge Date:                  Expected Discharge Plan:  [place]  In-House Referral:  NA  Discharge planning Services  CM Consult  Post Acute Care Choice:  Home Health, Durable Medical Equipment Choice offered to:  Patient  DME Arranged:  3-N-1 DME Agency:  [place]:  PT [place]:  [facility] (formerly [facility])  Status of Service:  Completed, signed off  If discussed at [employer] of Stay Meetings, dates discussed:    Additional Comments:  Guadalupe Maple, RN 06/04/2018, 11:30 AM

## 2018-06-04 NOTE — Discharge Summary (Signed)
SPORTS MEDICINE & JOINT REPLACEMENT   Lara Mulch, MD   Carlyon Shadow, PA-C [place], [place], [place]  [address]                             [phone]  PATIENT ID: Meagan Allen        MRN:  [id number]          DOB/AGE: [birth year] / 64 y.o.    DISCHARGE SUMMARY  ADMISSION DATE:    06/03/2018 DISCHARGE DATE:   06/04/2018   ADMISSION DIAGNOSIS: RIGHT Knee Osteoarthritis    DISCHARGE DIAGNOSIS:  RIGHT Knee Osteoarthritis    ADDITIONAL DIAGNOSIS: Active Problems:   S/P total knee replacement  Past Medical History:  Diagnosis Date  . Anemia   . Anxiety   . Arthritis    osteo  . Cancer [facility])    thyroid cancer  . CHF (congestive heart failure) (Meagan Allen)    diagnosed feb 2019  . Depression   . Dyspnea   . Fibromyalgia   . GERD (gastroesophageal reflux disease)   . Hearing loss of right ear   . Heart murmur   . History of colon polyps   . History of thyroid cancer 2007;  2009   dx papillary thyroid cancer w/ mets to cervical lymph nodes  . Hyperlipidemia   . Hypertension   . IBS (irritable bowel syndrome)   . Left rotator cuff tear   . Migraine   . Nonischemic cardiomyopathy [facility]) cardiologist-  dr Edyth Gunnels   per cardiac cath 08-28-2017  moderate LV dysfunction w/ diffuse hypocontractility ,  ef 35-40%  . 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 PONV (postoperative nausea and vomiting)    last colonoscopy propfol bp dropped and vomiting  . Post-surgical hypothyroidism   . Rosacea   . Type 2 diabetes mellitus (Meagan Allen)    followed by pcp  . 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[place])     PROCEDURE: Procedure(s): RIGHT TOTAL KNEE ARTHROPLASTY on 06/03/2018  CONSULTS:    HISTORY:  See H&P in chart  HOSPITAL COURSE:  MARQUETA Allen is a 62 y.o. admitted on 06/03/2018 and found to have a diagnosis of RIGHT Knee Osteoarthritis.  After appropriate laboratory studies were obtained   they were taken to the operating room on 06/03/2018 and underwent Procedure(s): RIGHT TOTAL KNEE ARTHROPLASTY.   They were given perioperative antibiotics:  Anti-infectives (From admission, onward)   Start     Dose/Rate Route Frequency Ordered Stop   06/03/18 1600  ceFAZolin (ANCEF) IVPB 2g/100 mL premix     2 g 200 mL/hr over 30 Minutes Intravenous Every 6 hours 06/03/18 1254 06/03/18 2244   06/03/18 0830  ceFAZolin (ANCEF) IVPB 2g/100 mL premix     2 g 200 mL/hr over 30 Minutes Intravenous On call to O.R. 06/03/18 6144 06/03/18 0940    .  Patient given tranexamic acid IV or topical and exparel intra-operatively.  Tolerated the procedure well.    POD# 1: Vital signs were stable.  Patient denied Chest pain, shortness of breath, or calf pain.  Patient was started on Aspirin twice daily at 8am.  Consults to PT, OT, and care management were made.  The patient was weight bearing as tolerated.  CPM was placed on the operative leg 0-90 degrees for 6-8 hours a day. When out of the CPM, patient was placed in  the foam block to achieve full extension. Incentive spirometry was taught.  Dressing was changed.       POD #2, Continued  PT for ambulation and exercise program.  IV saline locked.  O2 discontinued.    The remainder of the hospital course was dedicated to ambulation and strengthening.   The patient was discharged on 1 Day Post-Op in  Good condition.  Blood products given:none  DIAGNOSTIC STUDIES: Recent vital signs:  Patient Vitals for the past 24 hrs:  BP Temp Temp src Pulse Resp SpO2  06/04/18 1049 131/64 98.2 F (36.8 C) Oral 77 18 94 %  06/04/18 1023 124/70 - - 75 - 98 %  06/04/18 0551 (!) 94/52 98 F (36.7 C) Oral 70 16 95 %  06/04/18 0214 (!) 94/51 (!) 97.4 F (36.3 C) Oral 71 17 95 %  06/03/18 2224 (!) 107/59 98.3 F (36.8 C) Oral 79 - 97 %  06/03/18 2010 - - - 74 16 -  06/03/18 1841 117/75 98.2 F (36.8 C) - 76 16 98 %  06/03/18 1624 118/65 98.4 F (36.9 C) - 73 18  97 %  06/03/18 1536 111/60 97.8 F (36.6 C) Oral 67 16 99 %  06/03/18 1415 (!) 122/55 97.6 F (36.4 C) - 66 16 100 %  06/03/18 1249 120/62 97.6 F (36.4 C) Oral 72 14 99 %  06/03/18 1225 106/63 98.1 F (36.7 C) - 64 12 97 %  06/03/18 1215 (!) 97/57 - - 65 12 98 %       Recent laboratory studies: Recent Labs    06/04/18 0427  WBC 9.6  HGB 8.9*  HCT 29.6*  PLT 309   Recent Labs    06/04/18 0427  NA 141  K 4.0  CL 108  CO2 24  BUN 14  CREATININE 0.91  GLUCOSE 172*  CALCIUM 8.9   Lab Results  Component Value Date   INR 0.9 08/22/2017     Recent Radiographic Studies :  No results found.  DISCHARGE INSTRUCTIONS: Discharge Instructions    Call MD / Call 911   Complete by:  As directed    If you experience chest pain or shortness of breath, CALL 911 and be transported to the hospital emergency room.  If you develope a fever above 101 F, pus (white drainage) or increased drainage or redness at the wound, or calf pain, call your surgeon's office.   Constipation Prevention   Complete by:  As directed    Drink plenty of fluids.  Prune juice may be helpful.  You may use a stool softener, such as Colace (over the counter) 100 mg twice a day.  Use MiraLax (over the counter) for constipation as needed.   Diet - low sodium heart healthy   Complete by:  As directed    Discharge instructions   Complete by:  As directed    INSTRUCTIONS AFTER JOINT REPLACEMENT   Remove items at home which could result in a fall. This includes throw rugs or furniture in walking pathways ICE to the affected joint every three hours while awake for 30 minutes at a time, for at least the first 3-5 days, and then as needed for pain and swelling.  Continue to use ice for pain and swelling. You may notice swelling that will progress down to the foot and ankle.  This is normal after surgery.  Elevate your leg when you are not up walking on it.   Continue to use the breathing machine  you got in the hospital  (incentive spirometer) which will help keep your temperature down.  It is common for your temperature to cycle up and down following surgery, especially at night when you are not up moving around and exerting yourself.  The breathing machine keeps your lungs expanded and your temperature down.   DIET:  As you were doing prior to hospitalization, we recommend a well-balanced diet.  DRESSING / WOUND CARE / SHOWERING  Keep the surgical dressing until follow up.  The dressing is water proof, so you can shower without any extra covering.  IF THE DRESSING FALLS OFF or the wound gets wet inside, change the dressing with sterile gauze.  Please use good hand washing techniques before changing the dressing.  Do not use any lotions or creams on the incision until instructed by your surgeon.    ACTIVITY  Increase activity slowly as tolerated, but follow the weight bearing instructions below.   No driving for 6 weeks or until further direction given by your physician.  You cannot drive while taking narcotics.  No lifting or carrying greater than 10 lbs. until further directed by your surgeon. Avoid periods of inactivity such as sitting longer than an hour when not asleep. This helps prevent blood clots.  You may return to work once you are authorized by your doctor.     WEIGHT BEARING   Weight bearing as tolerated with assist device (walker, cane, etc) as directed, use it as long as suggested by your surgeon or therapist, typically at least 4-6 weeks.   EXERCISES  Results after joint replacement surgery are often greatly improved when you follow the exercise, range of motion and muscle strengthening exercises prescribed by your doctor. Safety measures are also important to protect the joint from further injury. Any time any of these exercises cause you to have increased pain or swelling, decrease what you are doing until you are comfortable again and then slowly increase them. If you have problems or  questions, call your caregiver or physical therapist for advice.   Rehabilitation is important following a joint replacement. After just a few days of immobilization, the muscles of the leg can become weakened and shrink (atrophy).  These exercises are designed to build up the tone and strength of the thigh and leg muscles and to improve motion. Often times heat used for twenty to thirty minutes before working out will loosen up your tissues and help with improving the range of motion but do not use heat for the first two weeks following surgery (sometimes heat can increase post-operative swelling).   These exercises can be done on a training (exercise) mat, on the floor, on a table or on a bed. Use whatever works the best and is most comfortable for you.    Use music or television while you are exercising so that the exercises are a pleasant break in your day. This will make your life better with the exercises acting as a break in your routine that you can look forward to.   Perform all exercises about fifteen times, three times per day or as directed.  You should exercise both the operative leg and the other leg as well.   Exercises include:   Quad Sets - Tighten up the muscle on the front of the thigh (Quad) and hold for 5-10 seconds.   Straight Leg Raises - With your knee straight (if you were given a brace, keep it on), lift the leg to 60 degrees, hold  for 3 seconds, and slowly lower the leg.  Perform this exercise against resistance later as your leg gets stronger.  Leg Slides: Lying on your back, slowly slide your foot toward your buttocks, bending your knee up off the floor (only go as far as is comfortable). Then slowly slide your foot back down until your leg is flat on the floor again.  Angel Wings: Lying on your back spread your legs to the side as far apart as you can without causing discomfort.  Hamstring Strength:  Lying on your back, push your heel against the floor with your leg straight  by tightening up the muscles of your buttocks.  Repeat, but this time bend your knee to a comfortable angle, and push your heel against the floor.  You may put a pillow under the heel to make it more comfortable if necessary.   A rehabilitation program following joint replacement surgery can speed recovery and prevent re-injury in the future due to weakened muscles. Contact your doctor or a physical therapist for more information on knee rehabilitation.    CONSTIPATION  Constipation is defined medically as fewer than three stools per week and severe constipation as less than one stool per week.  Even if you have a regular bowel pattern at home, your normal regimen is likely to be disrupted due to multiple reasons following surgery.  Combination of anesthesia, postoperative narcotics, change in appetite and fluid intake all can affect your bowels.   YOU MUST use at least one of the following options; they are listed in order of increasing strength to get the job done.  They are all available over the counter, and you may need to use some, POSSIBLY even all of these options:    Drink plenty of fluids (prune juice may be helpful) and high fiber foods Colace 100 mg by mouth twice a day  Senokot for constipation as directed and as needed Dulcolax (bisacodyl), take with full glass of water  Miralax (polyethylene glycol) once or twice a day as needed.  If you have tried all these things and are unable to have a bowel movement in the first 3-4 days after surgery call either your surgeon or your primary doctor.    If you experience loose stools or diarrhea, hold the medications until you stool forms back up.  If your symptoms do not get better within 1 week or if they get worse, check with your doctor.  If you experience "the worst abdominal pain ever" or develop nausea or vomiting, please contact the office immediately for further recommendations for treatment.   ITCHING:  If you experience itching with  your medications, try taking only a single pain pill, or even half a pain pill at a time.  You can also use Benadryl over the counter for itching or also to help with sleep.   TED HOSE STOCKINGS:  Use stockings on both legs until for at least 2 weeks or as directed by physician office. They may be removed at night for sleeping.  MEDICATIONS:  See your medication summary on the "After Visit Summary" that nursing will review with you.  You may have some home medications which will be placed on hold until you complete the course of blood thinner medication.  It is important for you to complete the blood thinner medication as prescribed.  PRECAUTIONS:  If you experience chest pain or shortness of breath - call 911 immediately for transfer to the hospital emergency department.   If you  develop a fever greater that 101 F, purulent drainage from wound, increased redness or drainage from wound, foul odor from the wound/dressing, or calf pain - CONTACT YOUR SURGEON.                                                   FOLLOW-UP APPOINTMENTS:  If you do not already have a post-op appointment, please call the office for an appointment to be seen by your surgeon.  Guidelines for how soon to be seen are listed in your "After Visit Summary", but are typically between 1-4 weeks after surgery.  OTHER INSTRUCTIONS:   Knee Replacement:  Do not place pillow under knee, focus on keeping the knee straight while resting. CPM instructions: 0-90 degrees, 2 hours in the morning, 2 hours in the afternoon, and 2 hours in the evening. Place foam block, curve side up under heel at all times except when in CPM or when walking.  DO NOT modify, tear, cut, or change the foam block in any way.  MAKE SURE YOU:  Understand these instructions.  Get help right away if you are not doing well or get worse.    Thank you for letting us be a part of your medical care team.  It is a privilege we respect greatly.  We hope these instructions  will help you stay on track for a fast and full recovery!   Increase activity slowly as tolerated   Complete by:  As directed       DISCHARGE MEDICATIONS:   Allergies as of 06/04/2018      Reactions   Sulfa Antibiotics Hives, Nausea Only   Bydureon [exenatide] Other (See Comments)   Yeast issues.   Invokana [canagliflozin] Other (See Comments)   Yeast issues.   Jardiance [empagliflozin] Other (See Comments)   Yeast issues.   Lisinopril Other (See Comments)   Pt is unsure of exact reaction   Pravastatin Other (See Comments)   Body pain.   Propofol Nausea And Vomiting, Other (See Comments)   Blood pressure bottomed out/syncope      Medication List    TAKE these medications   acetaminophen 500 MG tablet Commonly known as:  TYLENOL Take 2 tablets (1,000 mg total) by mouth every 6 (six) hours.   albuterol 108 (90 Base) MCG/ACT inhaler Commonly known as:  PROVENTIL HFA;VENTOLIN HFA Inhale 2 puffs into the lungs every 6 (six) hours as needed for wheezing or shortness of breath.   aspirin 325 MG EC tablet Take 1 tablet (325 mg total) by mouth 2 (two) times daily. What changed:    medication strength  how much to take  when to take this   buPROPion 150 MG 24 hr tablet Commonly known as:  WELLBUTRIN XL Take 450 mg by mouth every morning.   cetirizine 10 MG tablet Commonly known as:  ZYRTEC Take 10 mg by mouth daily.   clonazePAM 0.5 MG tablet Commonly known as:  KLONOPIN Take 0.5 mg by mouth at bedtime.   COSAMIN ASU ADVANCED FORMULA Caps Take 2 capsules by mouth daily.   ferrous sulfate 325 (65 FE) MG tablet Take 325 mg by mouth daily.   HAIR SKIN & NAILS ADVANCED PO Take 1 tablet by mouth daily.   LANTUS SOLOSTAR 100 UNIT/ML Solostar Pen Generic drug:  Insulin Glargine Inject 80 Units into the skin  daily at 10 pm.   levothyroxine 137 MCG tablet Commonly known as:  SYNTHROID, LEVOTHROID Take 137 mcg by mouth at bedtime.   LINZESS 72 MCG  capsule Generic drug:  linaclotide Take 72 mcg by mouth every other day.   liraglutide 18 MG/3ML Sopn Commonly known as:  VICTOZA Inject 1.2 mg into the skin at bedtime.   mesalamine 1.2 g EC tablet Commonly known as:  LIALDA Take 2.4 g by mouth 2 (two) times daily.   metFORMIN 500 MG 24 hr tablet Commonly known as:  GLUCOPHAGE-XR Take 1,000 mg by mouth every evening.   methocarbamol 500 MG tablet Commonly known as:  ROBAXIN Take 1-2 tablets (500-1,000 mg total) by mouth every 6 (six) hours as needed for muscle spasms. What changed:  how much to take   metoprolol succinate 100 MG 24 hr tablet Commonly known as:  TOPROL-XL Take 1 tablet (100 mg total) by mouth daily. Take with or immediately following a meal.   oxyCODONE 5 MG immediate release tablet Commonly known as:  Oxy IR/ROXICODONE Take 1-2 tablets (5-10 mg total) by mouth every 6 (six) hours as needed for moderate pain (pain score 4-6).   pantoprazole 40 MG tablet Commonly known as:  PROTONIX Take 40 mg by mouth daily.   polyethylene glycol powder powder Commonly known as:  GLYCOLAX/MIRALAX Take 17 g by mouth daily as needed (for constipation.).   sacubitril-valsartan 49-51 MG Commonly known as:  ENTRESTO Take 1 tablet by mouth 2 (two) times daily.            Durable Medical Equipment  (From admission, onward)         Start     Ordered   06/03/18 1255  DME Walker rolling  Once    Question:  Patient needs a walker to treat with the following condition  Answer:  S/P total knee replacement   06/03/18 1254   06/03/18 1255  DME 3 n 1  Once     06/03/18 1254   06/03/18 1255  DME Bedside commode  Once    Question:  Patient needs a bedside commode to treat with the following condition  Answer:  S/P total knee replacement   06/03/18 1254          FOLLOW UP VISIT:   Follow-up Information    [facility] Follow up.   Specialty:  [place] Why:  physical therapy Contact information: [place] [address] [phone]           DISPOSITION: HOME VS. SNF  CONDITION:  Keenan Bachelor 06/04/2018, 12:07 PM

## 2018-06-05 NOTE — Op Note (Signed)
TOTAL KNEE REPLACEMENT OPERATIVE NOTE:  06/03/2018  6:27 AM  PATIENT:  Meagan Allen  62 y.o. female  PRE-OPERATIVE DIAGNOSIS:  RIGHT Knee Osteoarthritis  POST-OPERATIVE DIAGNOSIS:  RIGHT Knee Osteoarthritis  PROCEDURE:  Procedure(s): RIGHT TOTAL KNEE ARTHROPLASTY  SURGEON:  Surgeon(s): Vickey Huger, MD  PHYSICIAN ASSISTANT: Carlyon Shadow, PA-C   ANESTHESIA:   spinal  SPECIMEN: None  COUNTS:  Correct  TOURNIQUET:   Total Tourniquet Time Documented: Thigh (Right) - 32 minutes Total: Thigh (Right) - 32 minutes   DICTATION:  Indication for procedure:    The patient is a 62 y.o. female who has failed conservative treatment for RIGHT Knee Osteoarthritis.  Informed consent was obtained prior to anesthesia. The risks versus benefits of the operation were explain and in a way the patient can, and did, understand.   On the implant demand matching protocol, this patient scored 10.  Therefore, this patient was not receive a polyethylene insert with vitamin E which is a high demand implant.  Description of procedure:     The patient was taken to the operating room and placed under anesthesia.  The patient was positioned in the usual fashion taking care that all body parts were adequately padded and/or protected.  A tourniquet was applied and the leg prepped and draped in the usual sterile fashion.  The extremity was exsanguinated with the esmarch and tourniquet inflated to 350 mmHg.  Pre-operative range of motion was normal.  The knee was in 5 degree of mild varus.  A midline incision approximately 6-7 inches long was made with a #10 blade.  A new blade was used to make a parapatellar arthrotomy going 2-3 cm into the quadriceps tendon, over the patella, and alongside the medial aspect of the patellar tendon.  A synovectomy was then performed with the #10 blade and forceps. I then elevated the deep MCL off the medial tibial metaphysis subperiosteally around to the semimembranosus  attachment.    I everted the patella and used calipers to measure patellar thickness.  I used the reamer to ream down to appropriate thickness to recreate the native thickness.  I then removed excess bone with the rongeur and sagittal saw.  I used the appropriately sized template and drilled the three lug holes.  I then put the trial in place and measured the thickness with the calipers to ensure recreation of the native thickness.  The trial was then removed and the patella subluxed and the knee brought into flexion.  A homan retractor was place to retract and protect the patella and lateral structures.  A Z-retractor was place medially to protect the medial structures.  The extra-medullary alignment system was used to make cut the tibial articular surface perpendicular to the anamotic axis of the tibia and in 3 degrees of posterior slope.  The cut surface and alignment jig was removed.  I then used the intramedullary alignment guide to make a 6 valgus cut on the distal femur.  I then marked out the epicondylar axis on the distal femur.  The posterior condylar axis measured 3 degrees.  I then used the anterior referencing sizer and measured the femur to be a size 7.  The 4-In-1 cutting block was screwed into place in external rotation matching the posterior condylar angle, making our cuts perpendicular to the epicondylar axis.  Anterior, posterior and chamfer cuts were made with the sagittal saw.  The cutting block and cut pieces were removed.  A lamina spreader was placed in 90 degrees of  flexion.  The ACL, PCL, menisci, and posterior condylar osteophytes were removed.  A 11 mm spacer blocked was found to offer good flexion and extension gap balance after mild in degree releasing.   The scoop retractor was then placed and the femoral finishing block was pinned in place.  The small sagittal saw was used as well as the lug drill to finish the femur.  The block and cut surfaces were removed and the medullary  canal hole filled with autograft bone from the cut pieces.  The tibia was delivered forward in deep flexion and external rotation.  A size D tray was selected and pinned into place centered on the medial 1/3 of the tibial tubercle.  The reamer and keel was used to prepare the tibia through the tray.    I then trialed with the size 7 femur, size D tibia, a 11 mm insert and the 35 patella.  I had excellent flexion/extension gap balance, excellent patella tracking.  Flexion was full and beyond 120 degrees; extension was zero.  These components were chosen and the staff opened them to me on the back table while the knee was lavaged copiously and the cement mixed.  The soft tissue was infiltrated with 60cc of exparel 1.3% through a 21 gauge needle.  I cemented in the components and removed all excess cement.  The polyethylene tibial component was snapped into place and the knee placed in extension while cement was hardening.  The capsule was infilltrated with a 60cc exparel/marcaine/saline mixture.   Once the cement was hard, the tourniquet was let down.  Hemostasis was obtained.  The arthrotomy was closed using a #1 stratofix running suture.  The deep soft tissues were closed with #0 vicryls and the subcuticular layer closed with #2-0 vicryl.  The skin was reapproximated and closed with 3.0 Monocryl.  The wound was covered with steristrips, aquacel dressing, and a TED stocking.   The patient was then awakened, extubated, and taken to the recovery room in stable condition.  BLOOD LOSS:  638VF COMPLICATIONS:  None.  PLAN OF CARE: Admit for overnight observation  PATIENT DISPOSITION:  PACU - hemodynamically stable.   Delay start of Pharmacological VTE agent (>24hrs) due to surgical blood loss or risk of bleeding:  not applicable  Please fax a copy of this op note to my office at [phone] (please only include page 1 and 2 of the Case Information op note)

## 2018-07-26 DIAGNOSIS — H5462 Unqualified visual loss, left eye, normal vision right eye: Secondary | ICD-10-CM | POA: Insufficient documentation

## 2018-07-26 DIAGNOSIS — H35033 Hypertensive retinopathy, bilateral: Secondary | ICD-10-CM | POA: Insufficient documentation

## 2018-07-26 DIAGNOSIS — E119 Type 2 diabetes mellitus without complications: Secondary | ICD-10-CM | POA: Insufficient documentation

## 2018-07-26 DIAGNOSIS — Q078 Other specified congenital malformations of nervous system: Secondary | ICD-10-CM | POA: Insufficient documentation

## 2018-08-14 ENCOUNTER — Encounter: Payer: Self-pay | Admitting: Cardiology

## 2018-08-14 ENCOUNTER — Ambulatory Visit (INDEPENDENT_AMBULATORY_CARE_PROVIDER_SITE_OTHER): Payer: BLUE CROSS/BLUE SHIELD | Admitting: Cardiology

## 2018-08-14 VITALS — BP 118/76 | HR 81 | Ht 67.0 in | Wt 229.1 lb

## 2018-08-14 DIAGNOSIS — E782 Mixed hyperlipidemia: Secondary | ICD-10-CM

## 2018-08-14 DIAGNOSIS — E1142 Type 2 diabetes mellitus with diabetic polyneuropathy: Secondary | ICD-10-CM

## 2018-08-14 DIAGNOSIS — Z96651 Presence of right artificial knee joint: Secondary | ICD-10-CM

## 2018-08-14 DIAGNOSIS — I428 Other cardiomyopathies: Secondary | ICD-10-CM

## 2018-08-14 DIAGNOSIS — I10 Essential (primary) hypertension: Secondary | ICD-10-CM | POA: Diagnosis not present

## 2018-08-14 NOTE — Progress Notes (Signed)
Cardiology Office Note:    Date:  08/14/2018   ID:  Meagan Allen, DOB Dec 31, 1955, MRN [id number]  PCP:  Meagan Rossetti, NP  Cardiologist:  Jenne Campus, MD    Referring MD: Meagan Rossetti, NP   Chief Complaint  Patient presents with  . Follow-up  Doing better  History of Present Illness:    Meagan Allen is a 63 y.o. female wit nonischemic cardiomyopathy however latest echocardiogram showed normalization of left ventricle ejection fraction.  On beta-blocker as well as Entresto which I will continue.  Few months ago she went through right knee replacement surgery.  She tells me she is initially disappointed with the knee surgery was a lot of pain but now gradually she is getting better now she seems to be happy with it she complained of having multiple aches in multiple joints in multiple portion of the body.  She does have fibromyalgia which seems to be acting up.  She understand that the problem is probably lack of mobility from her side.  Denies have any chest pain tightness squeezing pressure burning chest.  Past Medical History:  Diagnosis Date  . Anemia   . Anxiety   . Arthritis    osteo  . Cancer [facility])    thyroid cancer  . CHF (congestive heart failure) ([place])    diagnosed feb 2019  . Depression   . Dyspnea   . Fibromyalgia   . GERD (gastroesophageal reflux disease)   . Hearing loss of right ear   . Heart murmur   . History of colon polyps   . History of thyroid cancer 2007;  2009   dx papillary thyroid cancer w/ mets to cervical lymph nodes  . Hyperlipidemia   . Hypertension   . IBS (irritable bowel syndrome)   . Left rotator cuff tear   . Migraine   . Nonischemic cardiomyopathy [facility]) cardiologist-  dr Edyth Gunnels   per cardiac cath 08-28-2017  moderate LV dysfunction w/ diffuse hypocontractility ,  ef 35-40%  . 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 PONV (postoperative nausea  and vomiting)    last colonoscopy propfol bp dropped and vomiting  . Post-surgical hypothyroidism   . Rosacea   . Type 2 diabetes mellitus ([place])    followed by pcp  . 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[place])     Past Surgical History:  Procedure Laterality Date  . CARDIOVASCULAR STRESS TEST  08-20-2017   dr Agustin Cree   High risk nuclear study w/ large irreverisible defect in the basal and mid inferoseptal, inferior, inferolateral walls and apical septal, inferior walls with small peri infarct ischemia in the apical anterior & lateral walls (consistant w/ prior MI )/  no ST segment deviation noted /  nuclear stress ef 36%/  recommended cardiac cath  . CARPOMETACARPAL ([place]) FUSION OF THUMB Bilateral 2003  . COLONOSCOPY WITH PROPOFOL N/A 05/06/2013   Procedure: COLONOSCOPY WITH PROPOFOL;  Surgeon: Meagan Fair, MD;  Location: [facility];  Service: Endoscopy;  Laterality: N/A;  . D & C HYSTEROSCOPY /  RESECTION POLYP/ ROLLER BALL ABLATION  02-26-2004   dr Meagan Allen  [facility]  . DILATION AND CURETTAGE OF UTERUS  1984  . EXCISION MORTON'S NEUROMA Left 1996  . giant cell tumor  2011   Left ankle 2011  . KNEE ARTHROSCOPY  08-16-2010  dr Beola Cord  [facility];  05/ 2012   right x2 left x1  . LEFT  HEART CATH AND CORONARY ANGIOGRAPHY N/A 08/28/2017   Procedure: LEFT HEART CATH AND CORONARY ANGIOGRAPHY;  Surgeon: Meagan Sine, MD;  Location: [place] CV LAB;  Service: Cardiovascular;  Laterality: N/A;   large normal coronary arteries in a dominant RCA system;  moderate LV systoic dysfunction w/ diffuse hypocontractility (compatible w/ nonischemic cardiomyopathy), LV end diastoilc pressure normal, LVEF 35-45% by visual estimate  . NASAL LACRIMAL DUCT SURGERY  06/14/2009  . REDO RIGHT MODIFIED RADICAL NECK DISSECTION  08-29-2007    [facility]  . SHOULDER ARTHROSCOPY WITH ROTATOR CUFF REPAIR AND SUBACROMIAL DECOMPRESSION Left 10/17/2017   Procedure: Left shoulder mini open rotator cuff repair, subacromial decompression;   Surgeon: [name], MD;  Location: [facility];  Service: Orthopedics;  Laterality: Left;  Interscalene Block  . SHOULDER SURGERY     Left mini- open rotator cuff repair Dr. Tonita Cong 10-17-17  . TOTAL KNEE ARTHROPLASTY Right 06/03/2018   Procedure: RIGHT TOTAL KNEE ARTHROPLASTY;  Surgeon: Meagan Huger, MD;  Location: [facility];  Service: Orthopedics;  Laterality: Right;  . TOTAL THYROIDECTOMY  2007  -- [facility]   w/ dissection lymph nodes  . TRANSTHORACIC ECHOCARDIOGRAM  08-20-2017  dr Agustin Cree   ef 25-30%, severe diffuse hypokinesis with no identifiable regional variations, but with profound dyssynchrony, due to arrhythmia insuffient to evaluate LV diastolic dysfunction/  trivial MR/  mild LAE/ Ventricular septum motion abnormal funtion,dyssynergy, & paradox  . WRIST SURGERY Right 2001    Current Medications: Current Meds  Medication Sig  . acetaminophen (TYLENOL) 500 MG tablet Take 2 tablets (1,000 mg total) by mouth every 6 (six) hours.  Marland Kitchen buPROPion (WELLBUTRIN XL) 150 MG 24 hr tablet Take 450 mg by mouth every morning.   . cetirizine (ZYRTEC) 10 MG tablet Take 10 mg by mouth daily.  . clonazePAM (KLONOPIN) 0.5 MG tablet Take 0.5 mg by mouth at bedtime.   . ferrous sulfate 325 (65 FE) MG tablet Take 325 mg by mouth daily.  . Insulin Glargine (LANTUS SOLOSTAR) 100 UNIT/ML Solostar Pen Inject 80 Units into the skin daily at 10 pm.   . levothyroxine (SYNTHROID, LEVOTHROID) 137 MCG tablet Take 137 mcg by mouth at bedtime.   Marland Kitchen linaclotide (LINZESS) 72 MCG capsule Take 72 mcg by mouth every other day.   . liraglutide (VICTOZA) 18 MG/3ML SOPN Inject 1.2 mg into the skin at bedtime.   . mesalamine (LIALDA) 1.2 g EC tablet Take 2.4 g by mouth 2 (two) times daily.  . metFORMIN (GLUCOPHAGE-XR) 500 MG 24 hr tablet Take 1,000 mg by mouth every evening.  . methocarbamol (ROBAXIN) 500 MG tablet Take 1-2 tablets (500-1,000 mg total) by mouth every 6 (six) hours as needed for muscle spasms.  . metoprolol succinate  (TOPROL-XL) 100 MG 24 hr tablet Take 1 tablet (100 mg total) by mouth daily. Take with or immediately following a meal.  . Misc Natural Products (COSAMIN ASU ADVANCED FORMULA) CAPS Take 2 capsules by mouth daily.   . Multiple Vitamins-Minerals (HAIR SKIN & NAILS ADVANCED PO) Take 1 tablet by mouth daily.  . pantoprazole (PROTONIX) 40 MG tablet Take 40 mg by mouth daily.  . polyethylene glycol powder (GLYCOLAX/MIRALAX) powder Take 17 g by mouth daily as needed (for constipation.).   Marland Kitchen sacubitril-valsartan (ENTRESTO) 49-51 MG Take 1 tablet by mouth 2 (two) times daily.     Allergies:   Sulfa antibiotics; Bydureon [exenatide]; Invokana [canagliflozin]; Jardiance [empagliflozin]; Lisinopril; Pravastatin; and Propofol   Social History   Socioeconomic History  . Marital status:  Married    Spouse name: Jeneen Rinks  . Number of children: 2  . Years of education: 43  . Highest education level: Not on file  Occupational History  . Occupation: Banker for [employer]: [place]  Social Needs  . Financial resource strain: Not on file  . Food insecurity:    Worry: Not on file    Inability: Not on file  . Transportation needs:    Medical: Not on file    Non-medical: Not on file  Tobacco Use  . Smoking status: Former Smoker    Packs/day: 3.00    Years: 10.00    Pack years: 30.00    Types: Cigarettes    Last attempt to quit: 07/17/1980    Years since quitting: 38.1  . Smokeless tobacco: Never Used  Substance and Sexual Activity  . Alcohol use: No    Comment: Rare use  . Drug use: No  . Sexual activity: Yes    Birth control/protection: Post-menopausal  Lifestyle  . Physical activity:    Days per week: Not on file    Minutes per session: Not on file  . Stress: Not on file  Relationships  . Social connections:    Talks on phone: Not on file    Gets together: Not on file    Attends religious service: Not on file    Active member of club or organization:  Not on file    Attends meetings of clubs or organizations: Not on file    Relationship status: Not on file  Other Topics Concern  . Not on file  Social History Narrative   Waynetta was born in [place], [place]. She moved to [place] in 1978 when her family moved to this state. Monty currently lives in [place] with her husband of 93 years. They have 2 adult children and 1 grandson. 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     Family History: The patient's family history includes Arthritis in her mother; Asthma in her son; Cancer in her brother; Depression in her mother; Diabetes in her father; Gout in her brother, brother, and brother; Heart disease in her father and sister; Hypertension in her father; Stroke in her mother. ROS:   Please see the history of present illness.    All 14 point review of systems negative except as described per history of present illness  EKGs/Labs/Other Studies Reviewed:      Recent Labs: 02/01/2018: NT-Pro BNP 113 05/27/2018: ALT 21 06/04/2018: BUN 14; Creatinine, Ser 0.91; Hemoglobin 8.9; Platelets 309; Potassium 4.0; Sodium 141  Recent Lipid Panel    Component Value Date/Time   CHOL 192 03/27/2018 0937   TRIG 183 (H) 03/27/2018 0937   HDL 41 03/27/2018 0937   CHOLHDL 4.7 (H) 03/27/2018 0937   LDLCALC 114 (H) 03/27/2018 0937    Physical Exam:    VS:  BP 118/76   Pulse 81   Ht 5' 7"  (1.702 m)   Wt 229 lb 1.9 oz (103.9 kg)   SpO2 98%   BMI 35.89 kg/m     Wt Readings from Last 3 Encounters:  08/14/18 229 lb 1.9 oz (103.9 kg)  06/03/18 222 lb 2 oz (100.8 kg)  05/27/18 222 lb 2 oz (100.8 kg)     GEN:  Well nourished, well developed in no acute distress HEENT: Normal NECK: No JVD; No carotid bruits LYMPHATICS: No lymphadenopathy CARDIAC: RRR, no murmurs, no rubs, no gallops  RESPIRATORY:  Clear to auscultation without rales, wheezing or rhonchi  ABDOMEN: Soft, non-tender, non-distended MUSCULOSKELETAL:   No edema; No deformity  SKIN: Warm and dry LOWER EXTREMITIES: no swelling NEUROLOGIC:  Alert and oriented x 3 PSYCHIATRIC:  Normal affect   ASSESSMENT:    1. Nonischemic cardiomyopathy ([place])   2. Essential hypertension   3. Type 2 diabetes mellitus with diabetic polyneuropathy, unspecified whether long term insulin use (HCC)   4. Status post total right knee replacement   5. Mixed hyperlipidemia    PLAN:    In order of problems listed above:  1. Nonischemic cardiomyopathy beta-blocker and Delene Loll will continue last echocardiogram normalization of ejection fraction clinically she looks good denies having any shortness of breath swelling of lower extremities or paroxysmal nocturnal dyspnea. 2. Essential hypertension blood pressure well controlled continue present management. 3. Diabetes followed by internal medicine team. 4. Status post right knee replacement surgery doing better from that point review 5. Mixed dyslipidemia again long discussion about the treatment for it she does not want to try any statin or every single time we try statin she does have a lot of muscle aches, I talked to her about Zetia I talked to her about PCSK9 agent.  She is not ready to make a decision she want to think it over she said just now she started feeling better after her knee replacement surgery therefore she does not want to add any medication that can make her feel bad.  We will talk about this more in 3 months when I see her back.   Medication Adjustments/Labs and Tests Ordered: Current medicines are reviewed at length with the patient today.  Concerns regarding medicines are outlined above.  No orders of the defined types were placed in this encounter.  Medication changes: No orders of the defined types were placed in this encounter.   Signed, Park Liter, MD, [facility] 08/14/2018 8:34 AM    [place]

## 2018-08-14 NOTE — Patient Instructions (Signed)
Medication Instructions:  Your physician recommends that you continue on your current medications as directed. Please refer to the Current Medication list given to you today.  If you need a refill on your cardiac medications before your next appointment, please call your pharmacy.   Lab work: None   If you have labs (blood work) drawn today and your tests are completely normal, you will receive your results only by: Marland Kitchen MyChart Message (if you have MyChart) OR . A paper copy in the mail If you have any lab test that is abnormal or we need to change your treatment, we will call you to review the results.  Testing/Procedures: None  Follow-Up: At [facility], you and your health needs are our priority.  As part of our continuing mission to provide you with exceptional heart care, we have created designated Provider Care Teams.  These Care Teams include your primary Cardiologist (physician) and Advanced Practice Providers (APPs -  Physician Assistants and Nurse Practitioners) who all work together to provide you with the care you need, when you need it. You will need a follow up appointment in 3 months.  Please call our office 2 months in advance to schedule this appointment.

## 2018-08-15 DIAGNOSIS — H53432 Sector or arcuate defects, left eye: Secondary | ICD-10-CM | POA: Insufficient documentation

## 2018-08-16 ENCOUNTER — Other Ambulatory Visit: Payer: Self-pay | Admitting: Cardiology

## 2018-08-20 DIAGNOSIS — M79642 Pain in left hand: Secondary | ICD-10-CM | POA: Insufficient documentation

## 2018-09-05 DIAGNOSIS — M79639 Pain in unspecified forearm: Secondary | ICD-10-CM | POA: Insufficient documentation

## 2018-10-30 ENCOUNTER — Telehealth: Payer: Self-pay | Admitting: Cardiology

## 2018-10-30 NOTE — Telephone Encounter (Signed)
Left voicemail to return call about virtual visit

## 2018-11-04 ENCOUNTER — Other Ambulatory Visit: Payer: Self-pay

## 2018-11-04 ENCOUNTER — Encounter: Payer: Self-pay | Admitting: Cardiology

## 2018-11-04 ENCOUNTER — Telehealth: Payer: Self-pay | Admitting: Cardiology

## 2018-11-04 ENCOUNTER — Telehealth (INDEPENDENT_AMBULATORY_CARE_PROVIDER_SITE_OTHER): Payer: BLUE CROSS/BLUE SHIELD | Admitting: Cardiology

## 2018-11-04 VITALS — BP 118/76 | Wt 233.0 lb

## 2018-11-04 DIAGNOSIS — R002 Palpitations: Secondary | ICD-10-CM | POA: Diagnosis not present

## 2018-11-04 DIAGNOSIS — I10 Essential (primary) hypertension: Secondary | ICD-10-CM

## 2018-11-04 DIAGNOSIS — K219 Gastro-esophageal reflux disease without esophagitis: Secondary | ICD-10-CM

## 2018-11-04 DIAGNOSIS — I428 Other cardiomyopathies: Secondary | ICD-10-CM

## 2018-11-04 DIAGNOSIS — E782 Mixed hyperlipidemia: Secondary | ICD-10-CM

## 2018-11-04 MED ORDER — VALSARTAN 40 MG PO TABS: 40.0000 mg | ORAL_TABLET | Freq: Every day | ORAL | 1 refills | Status: DC

## 2018-11-04 NOTE — Telephone Encounter (Signed)
° °  Cardiac Questionnaire:    Since your last visit or hospitalization:    1. Have you been having new or worsening chest pain? no   2. Have you been having new or worsening shortness of breath? no 3. Have you been having new or worsening leg swelling, wt gain, or increase in abdominal girth (pants fitting more tightly)? Yes, in left hand   4. Have you had any passing out spells? no    *A YES to any of these questions would result in the appointment being kept. *If all the answers to these questions are NO, we should indicate that given the current situation regarding the worldwide coronarvirus pandemic, at the recommendation of the CDC, we are looking to limit gatherings in our waiting area, and thus will reschedule their appointment beyond four weeks from today.   _____________   [id number] Pre-Screening Questions:   Do you currently have a fever? no (yes = cancel and refer to pcp for e-visit)  Have you recently travelled on a cruise, internationally, or to [place], [place], [place], [place], [place], or [place], [place]) ? no (yes = cancel, stay home, monitor symptoms, and contact pcp or initiate e-visit if symptoms develop)  Have you been in contact with someone that is currently pending confirmation of Covid19 testing or has been confirmed to have the [place] virus?  no (yes = cancel, stay home, away from tested individual, monitor symptoms, and contact pcp or initiate e-visit if symptoms develop)  Are you currently experiencing fatigue or cough? no (yes = pt should be prepared to have a mask placed at the time of their visit).

## 2018-11-04 NOTE — Progress Notes (Signed)
Virtual Visit via Telephone Note   This visit type was conducted due to national recommendations for restrictions regarding the COVID-19 Pandemic (e.g. social distancing) in an effort to limit this patient's exposure and mitigate transmission in our community.  Due to her co-morbid illnesses, this patient is at least at moderate risk for complications without adequate follow up.  This format is felt to be most appropriate for this patient at this time.  The patient did not have access to video technology/had technical difficulties with video requiring transitioning to audio format only (telephone).  All issues noted in this document were discussed and addressed.  No physical exam could be performed with this format.  Please refer to the patient's chart for her  consent to telehealth for [facility].  Evaluation Performed:  Follow-up visit  This visit type was conducted due to national recommendations for restrictions regarding the COVID-19 Pandemic (e.g. social distancing).  This format is felt to be most appropriate for this patient at this time.  All issues noted in this document were discussed and addressed.  No physical exam was performed (except for noted visual exam findings with Video Visits).  Please refer to the patient's chart (MyChart message for video visits and phone note for telephone visits) for the patient's consent to telehealth for [facility].  Date:  11/04/2018  ID: Meagan Allen, DOB 01/24/1956, MRN [id number]   Patient Location: [place] [id number]   Provider location:   [place] Office  PCP:  Dianna Rossetti, NP (Inactive)  Cardiologist:  Jenne Campus, MD     Chief Complaint: I have no cardiac complaints  History of Present Illness:    Meagan Allen is a 63 y.o. female  who presents via audio/video conferencing for a telehealth visit today.  With cardiomyopathy likely normalization, dyslipidemia with intolerance to  some statins.  She does have regular follow-up visit and the purpose of the visit was trying to convince her to use more aggressive management for her dyslipidemia.  However she is suffering with wrist pain apparently she was seen by orthopedics a lot of tests were done which were directed towards the diagnosis of rheumatological problem however she cannot get appointment with rheumatology now because of coronavirus situation.  She is very upset about it.  Otherwise she seems to be doing fine she is able to walk climb stairs with some shortness of breath as usual but overall fine.   The patient does not have symptoms concerning for COVID-19 infection (fever, chills, cough, or new SHORTNESS OF BREATH).    Prior CV studies:   The following studies were reviewed today:       Past Medical History:  Diagnosis Date   Anemia    Anxiety    Arthritis    osteo   Cancer ([place])    thyroid cancer   CHF (congestive heart failure) ([place])    diagnosed feb 2019   Depression    Dyspnea    Fibromyalgia    GERD (gastroesophageal reflux disease)    Hearing loss of right ear    Heart murmur    History of colon polyps    History of thyroid cancer 2007;  2009   dx papillary thyroid cancer w/ mets to cervical lymph nodes   Hyperlipidemia    Hypertension    IBS (irritable bowel syndrome)    Left rotator cuff tear    Migraine    Nonischemic cardiomyopathy [facility]) cardiologist-  dr  krakowski   per cardiac cath 08-28-2017  moderate LV dysfunction w/ diffuse hypocontractility ,  ef 35-40%   Optic nerve and pathway injury, left, initial encounter    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, per opthamologist   PONV (postoperative nausea and vomiting)    last colonoscopy propfol bp dropped and vomiting   Post-surgical hypothyroidism    Rosacea    Type 2 diabetes mellitus ([place])    followed by pcp   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[place])     Past Surgical History:  Procedure  Laterality Date   CARDIOVASCULAR STRESS TEST  08-20-2017   dr Agustin Cree   High risk nuclear study w/ large irreverisible defect in the basal and mid inferoseptal, inferior, inferolateral walls and apical septal, inferior walls with small peri infarct ischemia in the apical anterior & lateral walls (consistant w/ prior MI )/  no ST segment deviation noted /  nuclear stress ef 36%/  recommended cardiac cath   CARPOMETACARPAL [facility]) FUSION OF THUMB Bilateral 2003   COLONOSCOPY WITH PROPOFOL N/A 05/06/2013   Procedure: COLONOSCOPY WITH PROPOFOL;  Surgeon: Garlan Fair, MD;  Location: [facility];  Service: Endoscopy;  Laterality: N/A;   D & C HYSTEROSCOPY /  RESECTION POLYP/ ROLLER BALL ABLATION  02-26-2004   dr Kathyrn Drown  [facility]   DILATION AND CURETTAGE OF UTERUS  1984   EXCISION MORTON'S NEUROMA Left 1996   giant cell tumor  2011   Left ankle 2011   KNEE ARTHROSCOPY  08-16-2010  dr Beola Cord  [facility];  05/ 2012   right x2 left x1   LEFT HEART CATH AND CORONARY ANGIOGRAPHY N/A 08/28/2017   Procedure: LEFT HEART CATH AND CORONARY ANGIOGRAPHY;  Surgeon: Troy Sine, MD;  Location: [place] [facility];  Service: Cardiovascular;  Laterality: N/A;   large normal coronary arteries in a dominant RCA system;  moderate LV systoic dysfunction w/ diffuse hypocontractility (compatible w/ nonischemic cardiomyopathy), LV end diastoilc pressure normal, LVEF 35-45% by visual estimate   NASAL LACRIMAL DUCT SURGERY  06/14/2009   REDO RIGHT MODIFIED RADICAL NECK DISSECTION  08-29-2007    [facility]   SHOULDER ARTHROSCOPY WITH ROTATOR CUFF REPAIR AND SUBACROMIAL DECOMPRESSION Left 10/17/2017   Procedure: Left shoulder mini open rotator cuff repair, subacromial decompression;  Surgeon: [name], MD;  Location: [facility];  Service: Orthopedics;  Laterality: Left;  Interscalene Block   SHOULDER SURGERY     Left mini- open rotator cuff repair Dr. Tonita Cong 10-17-17   TOTAL KNEE ARTHROPLASTY Right 06/03/2018   Procedure:  RIGHT TOTAL KNEE ARTHROPLASTY;  Surgeon: Vickey Huger, MD;  Location: [facility];  Service: Orthopedics;  Laterality: Right;   TOTAL THYROIDECTOMY  2007  -- [facility]   w/ dissection lymph nodes   TRANSTHORACIC ECHOCARDIOGRAM  08-20-2017  dr Agustin Cree   ef 25-30%, severe diffuse hypokinesis with no identifiable regional variations, but with profound dyssynchrony, due to arrhythmia insuffient to evaluate LV diastolic dysfunction/  trivial MR/  mild LAE/ Ventricular septum motion abnormal funtion,dyssynergy, & paradox   WRIST SURGERY Right 2001     Current Meds  Medication Sig   aspirin EC 81 MG tablet Take 81 mg by mouth daily.   buPROPion (WELLBUTRIN XL) 150 MG 24 hr tablet Take 450 mg by mouth every morning.    cetirizine (ZYRTEC) 10 MG tablet Take 10 mg by mouth daily.   clonazePAM (KLONOPIN) 0.5 MG tablet Take 0.5 mg by mouth at bedtime.    ferrous sulfate 325 (65 FE) MG tablet Take 325  mg by mouth daily.   Insulin Glargine (LANTUS SOLOSTAR) 100 UNIT/ML Solostar Pen Inject 80 Units into the skin daily at 10 pm.    levothyroxine (SYNTHROID, LEVOTHROID) 137 MCG tablet Take 137 mcg by mouth at bedtime.    linaclotide (LINZESS) 72 MCG capsule Take 72 mcg by mouth every other day.    liraglutide (VICTOZA) 18 MG/3ML SOPN Inject 1.2 mg into the skin at bedtime.    mesalamine (LIALDA) 1.2 g EC tablet Take 2.4 g by mouth 2 (two) times daily.   metFORMIN (GLUCOPHAGE-XR) 500 MG 24 hr tablet Take 1,000 mg by mouth every evening.   methocarbamol (ROBAXIN) 500 MG tablet Take 1-2 tablets (500-1,000 mg total) by mouth every 6 (six) hours as needed for muscle spasms.   metoprolol succinate (TOPROL-XL) 100 MG 24 hr tablet TAKE 1 TABLET BY MOUTH DAILY WITH OR IMMEDIATELY FOLLOWING A MEAL.   Misc Natural Products (COSAMIN ASU ADVANCED FORMULA) CAPS Take 2 capsules by mouth daily.    Multiple Vitamins-Minerals (HAIR SKIN & NAILS ADVANCED PO) Take 1 tablet by mouth daily.   pantoprazole (PROTONIX)  40 MG tablet Take 40 mg by mouth daily.   polyethylene glycol powder (GLYCOLAX/MIRALAX) powder Take 17 g by mouth daily as needed (for constipation.).    [DISCONTINUED] sacubitril-valsartan (ENTRESTO) 49-51 MG Take 1 tablet by mouth 2 (two) times daily.      Family History: The patient's family history includes Arthritis in her mother; Asthma in her son; Cancer in her brother; Depression in her mother; Diabetes in her father; Gout in her brother, brother, and brother; Heart disease in her father and sister; Hypertension in her father; Stroke in her mother.   ROS:   Please see the history of present illness.     All other systems reviewed and are negative.   Labs/Other Tests and Data Reviewed:     Recent Labs: 02/01/2018: NT-Pro BNP 113 05/27/2018: ALT 21 06/04/2018: BUN 14; Creatinine, Ser 0.91; Hemoglobin 8.9; Platelets 309; Potassium 4.0; Sodium 141  Recent Lipid Panel    Component Value Date/Time   CHOL 192 03/27/2018 0937   TRIG 183 (H) 03/27/2018 0937   HDL 41 03/27/2018 0937   CHOLHDL 4.7 (H) 03/27/2018 0937   LDLCALC 114 (H) 03/27/2018 0937      Exam:    Vital Signs:  BP 118/76    Wt 233 lb (105.7 kg)    BMI 36.49 kg/m     Wt Readings from Last 3 Encounters:  11/04/18 233 lb (105.7 kg)  08/14/18 229 lb 1.9 oz (103.9 kg)  06/03/18 222 lb 2 oz (100.8 kg)     Well nourished, well developed in no acute distress. Alert awake and x3.  We are not able to stop his video link with talking only on a phone.  There is no symptoms she described sometimes to have some swelling of the wrist and actually question she got to me was if it is possible that the wrist swelling is related to her heart which I told her no.  Diagnosis for this visit:   1. Essential hypertension   2. Nonischemic cardiomyopathy ([place])   3. Gastroesophageal reflux disease, esophagitis presence not specified   4. Palpitations   5. Mixed hyperlipidemia      ASSESSMENT & PLAN:    1.  Essential  hypertension blood pressure well controlled continue present management. 2.  Nonischemic cardiomyopathy with normalization I will stop her Entresto and I will simply put her on pure losartan.  She  want to simplify her medical therapy thinking that maybe the symptoms of the wrist are related some of the medication she is taking 3.  Gastroesophageal reflux disease doing well from that point of view. 4.  Palpitations denies having any. Mixed dyslipidemia I wanted to put PCSK9 on board however she declined she said she prefers not especially now when we get this a unclear situation about her wrist.  COVID-19 Education: The signs and symptoms of COVID-19 were discussed with the patient and how to seek care for testing (follow up with PCP or arrange E-visit).  The importance of social distancing was discussed today.  Patient Risk:   After full review of this patients clinical status, I feel that they are at least moderate risk at this time.  Time:   Today, I have spent 21 minutes with the patient with telehealth technology discussing pt health issues.  I spent 5 minutes reviewing her chart before the visit.  Visit was finished at 3:57 PM.    Medication Adjustments/Labs and Tests Ordered: Current medicines are reviewed at length with the patient today.  Concerns regarding medicines are outlined above.  No orders of the defined types were placed in this encounter.  Medication changes: No orders of the defined types were placed in this encounter.    Disposition: Follow-up in 4 months.  Signed, Park Liter, MD, [facility] 11/04/2018 4:00 PM    [place]

## 2018-11-04 NOTE — Patient Instructions (Signed)
Medication Instructions:  Your physician has recommended you make the following change in your medication:  STOP: ENTRESTO START: Valsartan 57m (1 TAB) daily If you need a refill on your cardiac medications before your next appointment, please call your pharmacy.   Lab work: None  If you have labs (blood work) drawn today and your tests are completely normal, you will receive your results only by: .Marland KitchenMyChart Message (if you have MyChart) OR . A paper copy in the mail If you have any lab test that is abnormal or we need to change your treatment, we will call you to review the results.  Testing/Procedures: NOne  Follow-Up: [facility] our [facility].  As part of our continuing mission to provide you with exceptional heart care, we have created designated Provider Care Teams.  These Care Teams include your primary Cardiologist (physician) and Advanced Practice Providers (APPs -  Physician Assistants and Nurse Practitioners) who all work together to provide you with the care you need, when you need it. You will need a follow up appointment in 4 months.   Any Other Special Instructions Will Be Listed Below (If Applicable).   Valsartan tablets What is this medicine? VALSARTAN (val SAR tan) is used to treat high blood pressure. This drug is also used to treat patients with heart failure and patients who have had a heart attack. This medicine may be used for other purposes; ask your health care provider or pharmacist if you have questions. COMMON BRAND NAME(S): Diovan What should I tell my health care provider before I take this medicine? They need to know if you have any of these conditions: -heart failure -kidney disease -liver disease -an unusual or allergic reaction to valsartan, other medicines, foods, dyes, or preservatives -pregnant or trying to get pregnant -breast-feeding How should I use this medicine? Take this medicine by mouth with a glass  of water. Follow the directions on the prescription label. This medicine can be taken with or without food. Take your medicine at regular intervals. Do not take it more often than directed. Talk to your pediatrician regarding the use of this medicine in children. While this drug may be prescribed for children as young as 6 years for selected conditions, precautions do apply. Overdosage: If you think you have taken too much of this medicine contact a poison control center or emergency room at once. NOTE: This medicine is only for you. Do not share this medicine with others. What if I miss a dose? If you miss a dose, take it as soon as you can. If it is almost time for your next dose, take only that dose. Do not take double or extra doses. What may interact with this medicine? -blood pressure medicines -lithium -diuretics, especially triamterene, spironolactone or amiloride -potassium salts or potassium supplements This list may not describe all possible interactions. Give your health care provider a list of all the medicines, herbs, non-prescription drugs, or dietary supplements you use. Also tell them if you smoke, drink alcohol, or use illegal drugs. Some items may interact with your medicine. What should I watch for while using this medicine? Visit your doctor or health care professional for regular checks on your progress. Check your blood pressure as directed. Ask your doctor or health care professional what your blood pressure should be and when you should contact him or her. Call your doctor or health care professional if you notice an irregular or fast heart beat. Women should  inform their doctor if they wish to become pregnant or think they might be pregnant. There is a potential for serious side effects to an unborn child, particularly in the second or third trimester. Talk to your health care professional or pharmacist for more information. You may get drowsy or dizzy. Do not drive, use  machinery, or do anything that needs mental alertness until you know how this drug affects you. Do not stand or sit up quickly, especially if you are an older patient. This reduces the risk of dizzy or fainting spells. Alcohol can make you more drowsy and dizzy. Avoid alcoholic drinks. Avoid salt substitutes unless you are told otherwise by your doctor or health care professional. Do not treat yourself for coughs, colds, or pain while you are taking this medicine without asking your doctor or health care professional for advice. Some ingredients may increase your blood pressure. What side effects may I notice from receiving this medicine? Side effects that you should report to your doctor or health care professional as soon as possible: -confusion, dizziness, light headedness or fainting spells -decreased amount of urine passed -difficulty breathing or swallowing, hoarseness, or tightening of the throat -fast or irregular heart beat, palpitations, or chest pain -skin rash, itching -swelling of your face, lips, tongue, hands, or feet Side effects that usually do not require medical attention (report to your doctor or health care professional if they continue or are bothersome): -cough -decreased sexual function -headache -nausea or stomach pain This list may not describe all possible side effects. Call your doctor for medical advice about side effects. You may report side effects to FDA at 1-800-FDA-1088. Where should I keep my medicine? Keep out of the reach of children. Store at room temperature between 15 and 30 degrees C (59 and 86 degrees F). Keep your medicine container tightly closed and protect from moisture. Throw away any unused medicine after the expiration date. NOTE: This sheet is a summary. It may not cover all possible information. If you have questions about this medicine, talk to your doctor, pharmacist, or health care provider.  2019 Elsevier/Gold Standard (2012-10-03  12:39:59)

## 2018-11-27 ENCOUNTER — Ambulatory Visit: Payer: Self-pay

## 2018-11-27 ENCOUNTER — Ambulatory Visit: Payer: BLUE CROSS/BLUE SHIELD | Admitting: Rheumatology

## 2018-11-27 ENCOUNTER — Telehealth: Payer: Self-pay | Admitting: *Deleted

## 2018-11-27 ENCOUNTER — Ambulatory Visit (INDEPENDENT_AMBULATORY_CARE_PROVIDER_SITE_OTHER): Payer: BLUE CROSS/BLUE SHIELD

## 2018-11-27 ENCOUNTER — Other Ambulatory Visit: Payer: Self-pay

## 2018-11-27 ENCOUNTER — Encounter: Payer: Self-pay | Admitting: Rheumatology

## 2018-11-27 VITALS — BP 155/86 | HR 87 | Resp 18 | Ht 67.0 in | Wt 232.4 lb

## 2018-11-27 DIAGNOSIS — M79641 Pain in right hand: Secondary | ICD-10-CM

## 2018-11-27 DIAGNOSIS — M25532 Pain in left wrist: Secondary | ICD-10-CM | POA: Diagnosis not present

## 2018-11-27 DIAGNOSIS — Z8719 Personal history of other diseases of the digestive system: Secondary | ICD-10-CM

## 2018-11-27 DIAGNOSIS — I428 Other cardiomyopathies: Secondary | ICD-10-CM

## 2018-11-27 DIAGNOSIS — Z79899 Other long term (current) drug therapy: Secondary | ICD-10-CM

## 2018-11-27 DIAGNOSIS — K760 Fatty (change of) liver, not elsewhere classified: Secondary | ICD-10-CM

## 2018-11-27 DIAGNOSIS — E782 Mixed hyperlipidemia: Secondary | ICD-10-CM

## 2018-11-27 DIAGNOSIS — M79642 Pain in left hand: Secondary | ICD-10-CM | POA: Diagnosis not present

## 2018-11-27 DIAGNOSIS — M79672 Pain in left foot: Secondary | ICD-10-CM

## 2018-11-27 DIAGNOSIS — Z8261 Family history of arthritis: Secondary | ICD-10-CM

## 2018-11-27 DIAGNOSIS — R5383 Other fatigue: Secondary | ICD-10-CM

## 2018-11-27 DIAGNOSIS — Z85850 Personal history of malignant neoplasm of thyroid: Secondary | ICD-10-CM

## 2018-11-27 DIAGNOSIS — M79671 Pain in right foot: Secondary | ICD-10-CM

## 2018-11-27 DIAGNOSIS — Z8739 Personal history of other diseases of the musculoskeletal system and connective tissue: Secondary | ICD-10-CM

## 2018-11-27 DIAGNOSIS — Z96651 Presence of right artificial knee joint: Secondary | ICD-10-CM

## 2018-11-27 DIAGNOSIS — M797 Fibromyalgia: Secondary | ICD-10-CM

## 2018-11-27 DIAGNOSIS — I10 Essential (primary) hypertension: Secondary | ICD-10-CM

## 2018-11-27 DIAGNOSIS — F418 Other specified anxiety disorders: Secondary | ICD-10-CM

## 2018-11-27 DIAGNOSIS — Z8639 Personal history of other endocrine, nutritional and metabolic disease: Secondary | ICD-10-CM

## 2018-11-27 NOTE — Progress Notes (Signed)
Office Visit Note  Patient: Meagan Allen             Date of Birth: 23-Feb-1956           MRN: [id number]             PCP: Dianna Rossetti, NP (Inactive) Referring: Verner Mould,* Visit Date: 11/27/2018 Occupation: _0 @  Subjective:  Left wrist pain and swelling.   History of Present Illness: Meagan Allen is a 63 y.o. female seen in consultation per request of Dr. Jeannie Fend.  She was seen last by me in 2016 for the symptoms of osteoarthritis.  At that time all her autoimmune work-up was negative.  She states over time she continued to have some discomfort due to fibromyalgia and osteoarthritis.  She initially got Visco supplement injections by Dr. Lorre Nick and then underwent right total knee replacement in November 2019 by Dr. Lorre Nick.  In January 2020 she went back to work at that time she developed increased pain and swelling in her left wrist joint.  She states this swelling progressively got worse over the next 2 weeks.  She went to see Dr. Gladstone Lighter and as she had history of left ankle joint giant cell tumor he did MRI of her left forearm to evaluate.  The MRI only showed edema.  She was advised to apply heat and take some anti-inflammatories.  She states the symptoms got worse and she got to see Dr. Jeannie Fend who evaluated her hand and obtained MRI of her left left wrist joint which revealed synovitis and if moderate effusion.  She was given a left wrist joint cortisone injection she states it helped for 3 days and she noticed improvement in her symptoms by 90% but gradually the symptoms came back.  She was given meloxicam and tramadol.  She states she has been off tramadol now.  Meloxicam helped her quite significantly.  She has been taking meloxicam on PRN basis only.  She has been also going to physical therapy where they are using a stim unit and massage therapy.  She has noticed improvement in her symptoms by 70 to 80%.  She also had been having some swelling over the left hand  MCP joints.  She has not noticed any discomfort or swelling in her left hand.  She has intermittent discomfort in her feet which she relates to osteoarthritis.  She continues to have some discomfort in her left shoulder where she had left rotator cuff tear in the past.  She also has discomfort in her right knee joint after right total knee replacement.  Activities of Daily Living:  Patient reports morning stiffness for 24 hours.   Patient Denies nocturnal pain.  Difficulty dressing/grooming: Reports Difficulty climbing stairs: Denies Difficulty getting out of chair: Reports Difficulty using hands for taps, buttons, cutlery, and/or writing: Reports  Review of Systems  Constitutional: Positive for fatigue. Negative for night sweats, weight gain and weight loss.  HENT: Positive for mouth dryness. Negative for mouth sores, trouble swallowing, trouble swallowing and nose dryness.   Eyes: Positive for dryness. Negative for pain, redness and visual disturbance.  Respiratory: Negative for cough, shortness of breath and difficulty breathing.   Cardiovascular: Positive for swelling in legs/feet. Negative for chest pain, palpitations, hypertension and irregular heartbeat.  Gastrointestinal: Positive for constipation. Negative for blood in stool and diarrhea.  Endocrine: Negative for increased urination.  Genitourinary: Negative for difficulty urinating and vaginal dryness.  Musculoskeletal: Positive for arthralgias, gait problem, joint pain, joint  swelling, muscle weakness, morning stiffness and muscle tenderness. Negative for myalgias and myalgias.  Skin: Negative for color change, rash, hair loss, skin tightness, ulcers and sensitivity to sunlight.  Allergic/Immunologic: Positive for susceptible to infections.  Neurological: Positive for weakness. Negative for dizziness, memory loss and night sweats.  Hematological: Negative for bruising/bleeding tendency and swollen glands.  Psychiatric/Behavioral:  Positive for depressed mood. Negative for sleep disturbance. The patient is nervous/anxious.     PMFS History:  Patient Active Problem List   Diagnosis Date Noted   S/P total knee replacement 06/03/2018   Left rotator cuff tear 10/17/2017   Complete rotator cuff tear 10/17/2017   Tear of left rotator cuff 10/17/2017   Nonischemic cardiomyopathy ([place]) 08/22/2017   Ulcerative colitis ([place]) 08/22/2017   Abnormal stress test 08/22/2017   Cardiovascular stress test abnormal 08/22/2017   Cardiomyopathy ([place]) 08/22/2017   Postsurgical hypothyroidism 08/10/2017   Esophageal reflux 08/10/2017   Type 2 diabetes mellitus with diabetic polyneuropathy ([place]) 08/10/2017   Hepatic steatosis 08/10/2017   Palpitations 08/10/2017   Pure hypercholesterolemia 08/10/2017   Mixed hyperlipidemia 08/10/2017   Gastroesophageal reflux disease 08/10/2017   Postoperative hypothyroidism 08/10/2017   Full thickness rotator cuff tear 08/06/2017   Resting tremor 03/03/2013   RUQ pain 03/03/2013   Hx of thyroid cancer 03/03/2013   Depression with anxiety 03/03/2013   History of malignant neoplasm of thyroid 03/03/2013   Right upper quadrant pain 03/03/2013   HTN (hypertension) 03/25/2012    Past Medical History:  Diagnosis Date   Anemia    Anxiety    Arthritis    osteo   Cancer ([place])    thyroid cancer   CHF (congestive heart failure) ([place])    diagnosed feb 2019   Depression    Dyspnea    Fibromyalgia    GERD (gastroesophageal reflux disease)    Hearing loss of right ear    Heart murmur    History of colon polyps    History of thyroid cancer 2007;  2009   dx papillary thyroid cancer w/ mets to cervical lymph nodes   Hyperlipidemia    Hypertension    IBS (irritable bowel syndrome)    Left rotator cuff tear    Migraine    Nonischemic cardiomyopathy [facility]) cardiologist-  dr Edyth Gunnels   per cardiac cath 08-28-2017  moderate LV dysfunction w/ diffuse  hypocontractility ,  ef 35-40%   Optic nerve and pathway injury, left, initial encounter    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, per opthamologist   PONV (postoperative nausea and vomiting)    last colonoscopy propfol bp dropped and vomiting   Post-surgical hypothyroidism    Rosacea    Type 2 diabetes mellitus ([place])    followed by pcp   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[place])     Family History  Problem Relation Age of Onset   Heart disease Father        MI died age 12   Hypertension Father    Diabetes Father    Arthritis Mother        rheumatoid athritis   Stroke Mother    Depression Mother    Gout Brother    Cancer Brother        thyroid CA   Gout Brother    Gout Brother    Heart disease Sister        3 months old   Asthma Son    Past Surgical History:  Procedure Laterality Date   CARDIOVASCULAR STRESS TEST  08-20-2017   dr Agustin Cree   High risk nuclear study w/ large irreverisible defect in the basal and mid inferoseptal, inferior, inferolateral walls and apical septal, inferior walls with small peri infarct ischemia in the apical anterior & lateral walls (consistant w/ prior MI )/  no ST segment deviation noted /  nuclear stress ef 36%/  recommended cardiac cath   CARPOMETACARPAL [facility]) FUSION OF THUMB Bilateral 2003   COLONOSCOPY WITH PROPOFOL N/A 05/06/2013   Procedure: COLONOSCOPY WITH PROPOFOL;  Surgeon: Garlan Fair, MD;  Location: [facility] END[facility]COPY;  Service: Endoscopy;  Laterality: N/A;   D & C HYSTEROSCOPY /  RESECTION POLYP/ ROLLER BALL ABLATION  02-26-2004   dr Kathyrn Drown  [facility]   DILATION AND CURETTAGE OF UTERUS  1984   EXCISION MORTON'S NEUROMA Left 1996   giant cell tumor  2011   Left ankle 2011   JOINT REPLACEMENT     KNEE ARTHROSCOPY  08-16-2010  dr Beola Cord  [facility];  05/ 2012   right x2 left x1   LEFT HEART CATH AND CORONARY ANGIOGRAPHY N/A 08/28/2017   Procedure: LEFT HEART CATH AND CORONARY ANGIOGRAPHY;  Surgeon: Troy Sine, MD;   Location: [place] CV LAB;  Service: Cardiovascular;  Laterality: N/A;   large normal coronary arteries in a dominant RCA system;  moderate LV systoic dysfunction w/ diffuse hypocontractility (compatible w/ nonischemic cardiomyopathy), LV end diastoilc pressure normal, LVEF 35-45% by visual estimate   NASAL LACRIMAL DUCT SURGERY  06/14/2009   REDO RIGHT MODIFIED RADICAL NECK DISSECTION  08-29-2007    [facility]   SHOULDER ARTHROSCOPY WITH ROTATOR CUFF REPAIR AND SUBACROMIAL DECOMPRESSION Left 10/17/2017   Procedure: Left shoulder mini open rotator cuff repair, subacromial decompression;  Surgeon: [name], MD;  Location: [facility];  Service: Orthopedics;  Laterality: Left;  Interscalene Block   SHOULDER SURGERY     Left mini- open rotator cuff repair Dr. Tonita Cong 10-17-17   TOTAL KNEE ARTHROPLASTY Right 06/03/2018   Procedure: RIGHT TOTAL KNEE ARTHROPLASTY;  Surgeon: Vickey Huger, MD;  Location: [facility];  Service: Orthopedics;  Laterality: Right;   TOTAL THYROIDECTOMY  2007  -- [facility]   w/ dissection lymph nodes   TRANSTHORACIC ECHOCARDIOGRAM  08-20-2017  dr Agustin Cree   ef 25-30%, severe diffuse hypokinesis with no identifiable regional variations, but with profound dyssynchrony, due to arrhythmia insuffient to evaluate LV diastolic dysfunction/  trivial MR/  mild LAE/ Ventricular septum motion abnormal funtion,dyssynergy, & paradox   WRIST SURGERY Right 2001   Social History   Social History Narrative   Jazalyn was born in [place], [place]. She moved to [place] in 1978 when her family moved to this state. Leia currently lives in [place] with her husband of 12 years. They have 2 adult children and 1 grandson. 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    There is no immunization history on file for this patient.   Objective: Vital Signs: BP (!) 155/86 (BP Location: Right Arm, Patient Position: Sitting, Cuff Size: Normal)    Pulse 87    Resp 18    Ht _0   (1.702 m)    Wt 232 lb 6.4 oz (105.4 kg)    BMI 36.40 kg/m    Physical Exam Vitals signs and nursing note reviewed.  Constitutional:      Appearance: She is well-developed.  HENT:     Head: Normocephalic and atraumatic.  Eyes:     Conjunctiva/sclera: Conjunctivae normal.  Neck:  Musculoskeletal: Normal range of motion.  Cardiovascular:     Rate and Rhythm: Normal rate and regular rhythm.     Heart sounds: Normal heart sounds.  Pulmonary:     Effort: Pulmonary effort is normal.     Breath sounds: Normal breath sounds.  Abdominal:     General: Bowel sounds are normal.     Palpations: Abdomen is soft.  Lymphadenopathy:     Cervical: No cervical adenopathy.  Skin:    General: Skin is warm and dry.     Capillary Refill: Capillary refill takes less than 2 seconds.  Neurological:     Mental Status: She is alert and oriented to person, place, and time.  Psychiatric:        Behavior: Behavior normal.      Musculoskeletal Exam: C-spine thoracic and lumbar spine good range of motion.  Shoulder joints elbow joints wrist joints were in good range of motion.  She has discomfort range of motion of her left wrist joint.  Swelling and tenderness was noted over the left wrist joint.  She also had some tenderness over left second third and fourth MCP joints.  Hip joints were in good range of motion without discomfort.  She has right total knee replacement which appears to be doing well.  She had no tenderness over her ankles.  She has minimal tenderness over MTPs and PIPs of her feet without any synovitis.  CDAI Exam: CDAI Score: Not documented Patient Global Assessment: Not documented; Provider Global Assessment: Not documented Swollen: Not documented; Tender: Not documented Joint Exam   Not documented   There is currently no information documented on the homunculus. Go to the Rheumatology activity and complete the homunculus joint exam.  Investigation: Findings:  March 2016:   Comprehensive metabolic panel showed potassium of 3.4, glucose 198, ALT 43, CBC was normal, sed rate was 9, CK was normal, uric acid 7.2.  ANA, ENA, ACE level and vitamin D were all within normal limits.    Imaging: Xr Foot 2 Views Left  Result Date: 11/27/2018 PIP and DIP narrowing was noted.  No MTP joint narrowing was noted.  No erosive changes were noted.  No intertarsal or tibiotalar joint space narrowing was noted.  A small calcaneal spur was noted. Impression: These findings are consistent with osteoarthritis of the hand.  Xr Foot 2 Views Right  Result Date: 11/27/2018 PIP and DIP narrowing was noted.  No MTP joint narrowing was noted.  No erosive changes were noted.  No intertarsal or tibiotalar joint space narrowing was noted.  A small calcaneal spur was noted. Impression: These findings are consistent with osteoarthritis of the hand.  Xr Hand 2 View Left  Result Date: 11/27/2018 Juxta-articular osteopenia was noted.  PIP DIP and MCP joint narrowing was noted.  Postsurgical changes in the [facility] joint was noted.  Cystic versus erosive changes were noted in the carpal bones.  Possible erosion was noted in the ulnar styloid. Impression: These findings are consistent with inflammatory arthritis most likely rheumatoid arthritis.  Xr Hand 2 View Right  Result Date: 11/27/2018 PIP and DIP narrowing was noted.  No significant MCP radiocarpal or intercarpal joint space narrowing was noted.  No erosive changes were noted.  Cystic changes were noted in the base of the first metacarpal. Impression: These findings are consistent with osteoarthritis of the hand.   Recent Labs: Lab Results  Component Value Date   WBC 9.6 06/04/2018   HGB 8.9 (L) 06/04/2018   PLT 309  06/04/2018   NA 141 06/04/2018   K 4.0 06/04/2018   CL 108 06/04/2018   CO2 24 06/04/2018   GLUCOSE 172 (H) 06/04/2018   BUN 14 06/04/2018   CREATININE 0.91 06/04/2018   BILITOT 0.6 05/27/2018   ALKPHOS 77 05/27/2018   AST 26  05/27/2018   ALT 21 05/27/2018   PROT 7.2 05/27/2018   ALBUMIN 4.3 05/27/2018   CALCIUM 8.9 06/04/2018   GFRAA >60 06/04/2018  September 19, 2018 CBC normal, BMP normal, ESR 31, uric acid 6.6, HLA-B27 negative October 18, 2018 ESR 28, ANA negative, SSA negative, SSB negative, RF negative, anti-CCP negative, CRP 10 August 28, 2018 MRI of the left forearm showed nonspecific edema over distal forearm September 26, 2018 MRI of the wrist joint showed right ulnar styloid synovitis with moderate effusion, radiocarpal synovitis with a small effusion and bone marrow edema in the capitate bone  Speciality Comments: No specialty comments available.  Procedures:  No procedures performed Allergies: Sulfa antibiotics; Bydureon [exenatide]; Invokana [canagliflozin]; Jardiance [empagliflozin]; Lisinopril; Pravastatin; and Propofol   Assessment / Plan:     Visit Diagnoses: Pain in left wrist -over her left wrist joint and MCP joints.  I also reviewed her MRI of the forearm and the left wrist joint.  The left wrist joint MRI showed radial ulnar joint synovitis with moderate effusion and radiocarpal synovitis with a small effusion.  She also had some scoreable bone marrow edema.  These findings are consistent with inflammatory arthritis.  She also had good response to cortisone injection.  The Mobic has been helpful as well.  These findings most likely are consistent with rheumatoid arthritis or could be related to ulcerative colitis..  There is positive family history of rheumatoid arthritis in her mother.  We discussed different treatment options and their side effects.  I was hesitant to put her on Plaquenil as she had some retinal changes per patient.  She is also allergic to sulfasalazine.  We discussed option of methotrexate.  Indications side effects contraindications were discussed.  She was in agreement to proceed with methotrexate.  I will obtain baseline labs today.  Once the labs are available she can be started on  methotrexate.  The plan is to start her on methotrexate 4 tablets p.o. weekly along with folic acid 2 mg p.o. daily.  We will check lab work every 2 weeks x 2 and then every 2 months to monitor for drug toxicity.  If her labs are stable at 2 weeks I will increase methotrexate to 6 tablets p.o. weekly.- Plan: Uric acid, 14-3-3 eta Protein  High risk medication use - Plan: COMPLETE METABOLIC PANEL WITH GFR, Hepatitis B surface antigen, Hepatitis B core antibody, IgM, Hepatitis C antibody, QuantiFERON-TB Gold Plus, HIV Antibody (routine testing w rflx), Serum protein electrophoresis with reflex, IgG, IgA, IgM, Thiopurine methyltransferase(tpmt)rbc  Drug Counseling  Chest-xray: August 23, 2017 unremarkable.  Patient was counseled on the purpose, proper use, and adverse effects of methotrexate including nausea, infection, and signs and symptoms of pneumonitis.  Reviewed instructions with patient to take methotrexate weekly along with folic acid daily.  Discussed the importance of frequent monitoring of kidney and liver function and blood counts, and provided patient with standing lab instructions.  Counseled patient to avoid NSAIDs and alcohol while on methotrexate.  Provided patient with educational materials on methotrexate and answered all questions.  Advised patient to get annual influenza vaccine and to get a pneumococcal vaccine if patient has not already had one.  Patient  voiced understanding.  Patient consented to methotrexate use.  Will upload into chart.    Other fatigue - Plan: Urinalysis, Routine w reflex microscopic, CK   Pain in both hands - Plan: XR Hand 2 View Right, XR Hand 2 View Left.  She has some decreased discomfort in her hands from underlying osteoarthritis.  The left hand showed significant juxta-articular osteopenia possible cystic and erosive changes in the carpal bones and joint space narrowing.  These findings are consistent with inflammatory arthritis.  Pain in both feet -  Plan: XR Foot 2 Views Right, XR Foot 2 Views Left.  The x-ray of bilateral feet were consistent with osteoarthritis.  Status post total right knee replacement-she had left total knee replacement by Dr. Lorre Nick in November 2019 which is recovering well.  Hx of rotator cuff tear - left April 2019 by Dr. Maxie Better.  She still have some residual discomfort in the left shoulder.  Family history of rheumatoid arthritis - mother.  According to patient her mother was treated with methotrexate.  History of ulcerative colitis-she is currently on Lialda.  Fibromyalgia - March 2016:  sed rate was 9, CK was normal, uric acid 7.2.  ANA, ENA, ACE level WNL  Other medical problems are listed as follows:  Hx of thyroid cancer  Depression with anxiety  Mixed hyperlipidemia  Hepatic steatosis  Nonischemic cardiomyopathy (HCC)  Essential hypertension  History of type 2 diabetes mellitus  History of gastroesophageal reflux (GERD)    Orders: Orders Placed This Encounter  Procedures   XR Hand 2 View Right   XR Hand 2 View Left   XR Foot 2 Views Right   XR Foot 2 Views Left   COMPLETE METABOLIC PANEL WITH GFR   Urinalysis, Routine w reflex microscopic   CK   Uric acid   14-3-3 eta Protein   Hepatitis B surface antigen   Hepatitis B core antibody, IgM   Hepatitis C antibody   QuantiFERON-TB Gold Plus   HIV Antibody (routine testing w rflx)   Serum protein electrophoresis with reflex   IgG, IgA, IgM   Thiopurine methyltransferase(tpmt)rbc   No orders of the defined types were placed in this encounter.   Face-to-face time spent with patient was 60 minutes. Greater than 50% of time was spent in counseling and coordination of care.  Follow-Up Instructions: Return for inflammatory arthritis.   Bo Merino, MD  Note - This record has been created using Editor, commissioning.  Chart creation errors have been sought, but may not always  have been located. Such creation errors  do not reflect on  the standard of medical care.

## 2018-11-27 NOTE — Patient Instructions (Addendum)
Methotrexate tablets What is this medicine? METHOTREXATE (METH oh TREX ate) is a chemotherapy drug used to treat cancer including breast cancer, leukemia, and lymphoma. This medicine can also be used to treat psoriasis and certain kinds of arthritis. This medicine may be used for other purposes; ask your health care provider or pharmacist if you have questions. COMMON BRAND NAME(S): Rheumatrex, Trexall What should I tell my health care provider before I take this medicine? They need to know if you have any of these conditions: -fluid in the stomach area or lungs -if you often drink alcohol -infection or immune system problems -kidney disease or on hemodialysis -liver disease -low blood counts, like low white cell, platelet, or red cell counts -lung disease -radiation therapy -stomach ulcers -ulcerative colitis -an unusual or allergic reaction to methotrexate, other medicines, foods, dyes, or preservatives -pregnant or trying to get pregnant -breast-feeding How should I use this medicine? Take this medicine by mouth with a glass of water. Follow the directions on the prescription label. Take your medicine at regular intervals. Do not take it more often than directed. Do not stop taking except on your doctor's advice. Make sure you know why you are taking this medicine and how often you should take it. If this medicine is used for a condition that is not cancer, like arthritis or psoriasis, it should be taken weekly, NOT daily. Taking this medicine more often than directed can cause serious side effects, even death. Talk to your healthcare provider about safe handling and disposal of this medicine. You may need to take special precautions. Talk to your pediatrician regarding the use of this medicine in children. While this drug may be prescribed for selected conditions, precautions do apply. Overdosage: If you think you have taken too much of this medicine contact a poison control center or  emergency room at once. NOTE: This medicine is only for you. Do not share this medicine with others. What if I miss a dose? If you miss a dose, talk with your doctor or health care professional. Do not take double or extra doses. What may interact with this medicine? This medicine may interact with the following medication: -acitretin -aspirin and aspirin-like medicines including salicylates -azathioprine -certain antibiotics like penicillins, tetracycline, and chloramphenicol -cyclosporine -gold -hydroxychloroquine -live virus vaccines -NSAIDs, medicines for pain and inflammation, like ibuprofen or naproxen -other cytotoxic agents -penicillamine -phenylbutazone -phenytoin -probenecid -retinoids such as isotretinoin and tretinoin -steroid medicines like prednisone or cortisone -sulfonamides like sulfasalazine and trimethoprim/sulfamethoxazole -theophylline This list may not describe all possible interactions. Give your health care provider a list of all the medicines, herbs, non-prescription drugs, or dietary supplements you use. Also tell them if you smoke, drink alcohol, or use illegal drugs. Some items may interact with your medicine. What should I watch for while using this medicine? Avoid alcoholic drinks. This medicine can make you more sensitive to the sun. Keep out of the sun. If you cannot avoid being in the sun, wear protective clothing and use sunscreen. Do not use sun lamps or tanning beds/booths. You may need blood work done while you are taking this medicine. Call your doctor or health care professional for advice if you get a fever, chills or sore throat, or other symptoms of a cold or flu. Do not treat yourself. This drug decreases your body's ability to fight infections. Try to avoid being around people who are sick. This medicine may increase your risk to bruise or bleed. Call your doctor or health care professional  if you notice any unusual bleeding. Check with your  doctor or health care professional if you get an attack of severe diarrhea, nausea and vomiting, or if you sweat a lot. The loss of too much body fluid can make it dangerous for you to take this medicine. Talk to your doctor about your risk of cancer. You may be more at risk for certain types of cancers if you take this medicine. Both men and women must use effective birth control with this medicine. Do not become pregnant while taking this medicine or until at least 1 normal menstrual cycle has occurred after stopping it. Women should inform their doctor if they wish to become pregnant or think they might be pregnant. Men should not father a child while taking this medicine and for 3 months after stopping it. There is a potential for serious side effects to an unborn child. Talk to your health care professional or pharmacist for more information. Do not breast-feed an infant while taking this medicine. What side effects may I notice from receiving this medicine? Side effects that you should report to your doctor or health care professional as soon as possible: -allergic reactions like skin rash, itching or hives, swelling of the face, lips, or tongue -breathing problems or shortness of breath -diarrhea -dry, nonproductive cough -low blood counts - this medicine may decrease the number of white blood cells, red blood cells and platelets. You may be at increased risk for infections and bleeding. -mouth sores -redness, blistering, peeling or loosening of the skin, including inside the mouth -signs of infection - fever or chills, cough, sore throat, pain or trouble passing urine -signs and symptoms of bleeding such as bloody or black, tarry stools; red or dark-brown urine; spitting up blood or brown material that looks like coffee grounds; red spots on the skin; unusual bruising or bleeding from the eye, gums, or nose -signs and symptoms of kidney injury like trouble passing urine or change in the amount  of urine -signs and symptoms of liver injury like dark yellow or brown urine; general ill feeling or flu-like symptoms; light-colored stools; loss of appetite; nausea; right upper belly pain; unusually weak or tired; yellowing of the eyes or skin Side effects that usually do not require medical attention (report to your doctor or health care professional if they continue or are bothersome): -dizziness -hair loss -tiredness -upset stomach -vomiting This list may not describe all possible side effects. Call your doctor for medical advice about side effects. You may report side effects to FDA at 1-800-FDA-1088. Where should I keep my medicine? Keep out of the reach of children. Store at room temperature between 20 and 25 degrees C (68 and 77 degrees F). Protect from light. Throw away any unused medicine after the expiration date. NOTE: This sheet is a summary. It may not cover all possible information. If you have questions about this medicine, talk to your doctor, pharmacist, or health care provider.  2019 [employer] (2017-02-22 13:38:43) Standing Labs We placed an order today for your standing lab work.    Please come back and get your standing labs in 2 weeks x 2 and then in 2 months  We have open lab Monday through Friday from 8:30-11:30 AM and 1:30-4:00 PM  at the office of Dr. Bo Merino.   You may experience shorter wait times on Monday and Friday afternoons. The office is located at [address], [place], [place], [place] [address] No appointment is necessary.  Labs are drawn by [facility].  You may receive a bill from [facility] for your lab work.  If you wish to have your labs drawn at another location, please call the office 24 hours in advance to send orders.  If you have any questions regarding directions or hours of operation,  please call [phone].   Just as a reminder please drink plenty of water prior to coming for your lab work. Thanks!

## 2018-11-27 NOTE — Telephone Encounter (Signed)
Once labs return, send prescription to the pharmacy for MTX 4 tabs weekly and Folic Acid 1 mg daily. Standing Orders in 2 weeks.

## 2018-11-29 NOTE — Telephone Encounter (Signed)
Awaiting lab results

## 2018-12-05 LAB — URINALYSIS, ROUTINE W REFLEX MICROSCOPIC
Bilirubin Urine: NEGATIVE
Glucose, UA: NEGATIVE
Hgb urine dipstick: NEGATIVE
Ketones, ur: NEGATIVE
Leukocytes,Ua: NEGATIVE
Nitrite: NEGATIVE
Protein, ur: NEGATIVE
Specific Gravity, Urine: 1.021 (ref 1.001–1.03)
pH: <=5 (ref 5.0–8.0)

## 2018-12-05 LAB — IGG, IGA, IGM
IgG (Immunoglobin G), Serum: 567 mg/dL — ABNORMAL LOW (ref 600–1540)
IgM, Serum: 72 mg/dL (ref 50–300)
Immunoglobulin A: 96 mg/dL (ref 70–320)

## 2018-12-05 LAB — PROTEIN ELECTROPHORESIS, SERUM, WITH REFLEX
Albumin ELP: 3.9 g/dL (ref 3.8–4.8)
Alpha 1: 0.3 g/dL (ref 0.2–0.3)
Alpha 2: 0.7 g/dL (ref 0.5–0.9)
Beta 2: 0.3 g/dL (ref 0.2–0.5)
Beta Globulin: 0.4 g/dL (ref 0.4–0.6)
Gamma Globulin: 0.6 g/dL — ABNORMAL LOW (ref 0.8–1.7)
Total Protein: 6.2 g/dL (ref 6.1–8.1)

## 2018-12-05 LAB — COMPLETE METABOLIC PANEL WITH GFR
AG Ratio: 2.3 (calc) (ref 1.0–2.5)
ALT: 33 U/L — ABNORMAL HIGH (ref 6–29)
AST: 19 U/L (ref 10–35)
Albumin: 4.3 g/dL (ref 3.6–5.1)
Alkaline phosphatase (APISO): 88 U/L (ref 37–153)
BUN: 11 mg/dL (ref 7–25)
CO2: 24 mmol/L (ref 20–32)
Calcium: 10 mg/dL (ref 8.6–10.4)
Chloride: 106 mmol/L (ref 98–110)
Creat: 0.76 mg/dL (ref 0.50–0.99)
GFR, Est African American: 96 mL/min/{1.73_m2} (ref >=60)
GFR, Est Non African American: 83 mL/min/{1.73_m2} (ref >=60)
Globulin: 1.9 g/dL (calc) (ref 1.9–3.7)
Glucose, Bld: 191 mg/dL — ABNORMAL HIGH (ref 65–139)
Potassium: 3.9 mmol/L (ref 3.5–5.3)
Sodium: 141 mmol/L (ref 135–146)
Total Bilirubin: 0.4 mg/dL (ref 0.2–1.2)
Total Protein: 6.2 g/dL (ref 6.1–8.1)

## 2018-12-05 LAB — CK: Total CK: 54 U/L (ref 29–143)

## 2018-12-05 LAB — HEPATITIS B SURFACE ANTIGEN: Hepatitis B Surface Ag: NONREACTIVE

## 2018-12-05 LAB — QUANTIFERON-TB GOLD PLUS
Mitogen-NIL: >10 IU/mL
NIL: 0.02 IU/mL
QuantiFERON-TB Gold Plus: NEGATIVE
TB1-NIL: 0 IU/mL
TB2-NIL: 0 IU/mL

## 2018-12-05 LAB — HEPATITIS C ANTIBODY
Hepatitis C Ab: NONREACTIVE
SIGNAL TO CUT-OFF: 0.01 (ref <1.00)

## 2018-12-05 LAB — HEPATITIS B CORE ANTIBODY, IGM: Hep B C IgM: NONREACTIVE

## 2018-12-05 LAB — 14-3-3 ETA PROTEIN: 14-3-3 eta Protein: <0.2 ng/mL (ref <0.2)

## 2018-12-05 LAB — URIC ACID: Uric Acid, Serum: 8.8 mg/dL — ABNORMAL HIGH (ref 2.5–7.0)

## 2018-12-05 LAB — THIOPURINE METHYLTRANSFERASE (TPMT), RBC: Thiopurine Methyltransferase, RBC: 13 nmol/hr/mL RBC

## 2018-12-05 LAB — HIV ANTIBODY (ROUTINE TESTING W REFLEX): HIV 1&2 Ab, 4th Generation: NONREACTIVE

## 2018-12-05 NOTE — Telephone Encounter (Signed)
Patient would like to discuss the medication in detail again prior to starting the medication. Patient has been scheduled for a sooner new patient follow up to discuss lab results and medication.

## 2018-12-05 NOTE — Telephone Encounter (Signed)
Ok thank you for informing me.  We will review labs and MTX at her follow up visit.

## 2018-12-05 NOTE — Progress Notes (Signed)
Office Visit Note  Patient: Meagan Allen             Date of Birth: [birth year]           MRN: [id number]             PCP: Dianna Rossetti, NP (Inactive) Referring: No ref. provider found Visit Date: 12/10/2018 Occupation: _0 @  Subjective:  Left wrist pain.   History of Present Illness: Meagan Allen is a 63 y.o. female with history of left wrist swelling.  According to patient in the last few days she has been experiencing pain and swelling in the left wrist joint, left ankle joint and right knee joint.  She continues to have significant stiffness.  She also has difficulty after prolonged sitting.  She has difficulty opening her left hand in the morning.  She states her right knee joint replacement has been more bothersome recently.  She also has some discomfort in the left shoulder where she had rotator cuff tear repair.  She states her ulcerative colitis is not currently active.  She also recalls in the past she was given treatment for gout but she never had episodes of gout.  She recalls having history of hyperuricemia.  She recalls her uric acid as high as 11 in the past.  Activities of Daily Living:  Patient reports morning stiffness for 30 minutes.   Patient Denies nocturnal pain.  Difficulty dressing/grooming: Denies Difficulty climbing stairs: Reports Difficulty getting out of chair: Reports Difficulty using hands for taps, buttons, cutlery, and/or writing: Reports  Review of Systems  Constitutional: Positive for fatigue. Negative for night sweats, weight gain and weight loss.  HENT: Positive for mouth dryness. Negative for mouth sores, trouble swallowing, trouble swallowing and nose dryness.   Eyes: Positive for dryness. Negative for pain, redness and visual disturbance.  Respiratory: Negative for cough, shortness of breath and difficulty breathing.   Cardiovascular: Negative for chest pain, palpitations, hypertension, irregular heartbeat and swelling in legs/feet.   Gastrointestinal: Positive for blood in stool and diarrhea. Negative for constipation.  Endocrine: Negative for increased urination.  Genitourinary: Negative for vaginal dryness.  Musculoskeletal: Positive for arthralgias, joint pain, joint swelling and morning stiffness. Negative for myalgias, muscle weakness, muscle tenderness and myalgias.  Skin: Negative for color change, rash, hair loss, skin tightness, ulcers and sensitivity to sunlight.  Allergic/Immunologic: Negative for susceptible to infections.  Neurological: Negative for dizziness, memory loss, night sweats and weakness.  Hematological: Negative for swollen glands.  Psychiatric/Behavioral: Positive for depressed mood. Negative for sleep disturbance. The patient is not nervous/anxious.     PMFS History:  Patient Active Problem List   Diagnosis Date Noted  . S/P total knee replacement 06/03/2018  . Left rotator cuff tear 10/17/2017  . Complete rotator cuff tear 10/17/2017  . Tear of left rotator cuff 10/17/2017  . Nonischemic cardiomyopathy ([place]) 08/22/2017  . Ulcerative colitis ([place]) 08/22/2017  . Abnormal stress test 08/22/2017  . Cardiovascular stress test abnormal 08/22/2017  . Cardiomyopathy ([place]) 08/22/2017  . Postsurgical hypothyroidism 08/10/2017  . Esophageal reflux 08/10/2017  . Type 2 diabetes mellitus with diabetic polyneuropathy ([place]) 08/10/2017  . Hepatic steatosis 08/10/2017  . Palpitations 08/10/2017  . Pure hypercholesterolemia 08/10/2017  . Mixed hyperlipidemia 08/10/2017  . Gastroesophageal reflux disease 08/10/2017  . Postoperative hypothyroidism 08/10/2017  . Full thickness rotator cuff tear 08/06/2017  . Resting tremor 03/03/2013  . RUQ pain 03/03/2013  . Hx of thyroid cancer 03/03/2013  . Depression with anxiety 03/03/2013  .  History of malignant neoplasm of thyroid 03/03/2013  . Right upper quadrant pain 03/03/2013  . HTN (hypertension) 03/25/2012    Past Medical History:  Diagnosis Date   . Anemia   . Anxiety   . Arthritis    osteo  . Cancer [facility])    thyroid cancer  . CHF (congestive heart failure) ([place])    diagnosed feb 2019  . Depression   . Dyspnea   . Fibromyalgia   . GERD (gastroesophageal reflux disease)   . Hearing loss of right ear   . Heart murmur   . History of colon polyps   . History of thyroid cancer 2007;  2009   dx papillary thyroid cancer w/ mets to cervical lymph nodes  . Hyperlipidemia   . Hypertension   . IBS (irritable bowel syndrome)   . Left rotator cuff tear   . Migraine   . Nonischemic cardiomyopathy [facility]) cardiologist-  dr Edyth Gunnels   per cardiac cath 08-28-2017  moderate LV dysfunction w/ diffuse hypocontractility ,  ef 35-40%  . 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 PONV (postoperative nausea and vomiting)    last colonoscopy propfol bp dropped and vomiting  . Post-surgical hypothyroidism   . Rosacea   . Type 2 diabetes mellitus ([place])    followed by pcp  . 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[place])     Family History  Problem Relation Age of Onset  . Heart disease Father        MI died age 28  . Hypertension Father   . Diabetes Father   . Arthritis Mother        rheumatoid athritis  . Stroke Mother   . Depression Mother   . Gout Brother   . Cancer Brother        thyroid CA  . Gout Brother   . Gout Brother   . Heart disease Sister        3 months old  . Asthma Son    Past Surgical History:  Procedure Laterality Date  . CARDIOVASCULAR STRESS TEST  08-20-2017   dr Agustin Cree   High risk nuclear study w/ large irreverisible defect in the basal and mid inferoseptal, inferior, inferolateral walls and apical septal, inferior walls with small peri infarct ischemia in the apical anterior & lateral walls (consistant w/ prior MI )/  no ST segment deviation noted /  nuclear stress ef 36%/  recommended cardiac cath  . CARPOMETACARPAL ([place]) FUSION OF THUMB  Bilateral 2003  . COLONOSCOPY WITH PROPOFOL N/A 05/06/2013   Procedure: COLONOSCOPY WITH PROPOFOL;  Surgeon: Garlan Fair, MD;  Location: [facility];  Service: Endoscopy;  Laterality: N/A;  . D & C HYSTEROSCOPY /  RESECTION POLYP/ ROLLER BALL ABLATION  02-26-2004   dr Kathyrn Drown  [facility]  . DILATION AND CURETTAGE OF UTERUS  1984  . EXCISION MORTON'S NEUROMA Left 1996  . giant cell tumor  2011   Left ankle 2011  . JOINT REPLACEMENT    . KNEE ARTHROSCOPY  08-16-2010  dr Beola Cord  [facility];  05/ 2012   right x2 left x1  . LEFT HEART CATH AND CORONARY ANGIOGRAPHY N/A 08/28/2017   Procedure: LEFT HEART CATH AND CORONARY ANGIOGRAPHY;  Surgeon: Troy Sine, MD;  Location: [facility];  Service: Cardiovascular;  Laterality: N/A;   large normal coronary arteries in a dominant RCA system;  moderate LV systoic dysfunction w/ diffuse hypocontractility (  compatible w/ nonischemic cardiomyopathy), LV end diastoilc pressure normal, LVEF 35-45% by visual estimate  . NASAL LACRIMAL DUCT SURGERY  06/14/2009  . REDO RIGHT MODIFIED RADICAL NECK DISSECTION  08-29-2007    [facility]  . SHOULDER ARTHROSCOPY WITH ROTATOR CUFF REPAIR AND SUBACROMIAL DECOMPRESSION Left 10/17/2017   Procedure: Left shoulder mini open rotator cuff repair, subacromial decompression;  Surgeon: [name], MD;  Location: [facility];  Service: Orthopedics;  Laterality: Left;  Interscalene Block  . SHOULDER SURGERY     Left mini- open rotator cuff repair Dr. Tonita Cong 10-17-17  . TOTAL KNEE ARTHROPLASTY Right 06/03/2018   Procedure: RIGHT TOTAL KNEE ARTHROPLASTY;  Surgeon: Vickey Huger, MD;  Location: [facility];  Service: Orthopedics;  Laterality: Right;  . TOTAL THYROIDECTOMY  2007  -- [facility]   w/ dissection lymph nodes  . TRANSTHORACIC ECHOCARDIOGRAM  08-20-2017  dr Agustin Cree   ef 25-30%, severe diffuse hypokinesis with no identifiable regional variations, but with profound dyssynchrony, due to arrhythmia insuffient to evaluate LV diastolic dysfunction/   trivial MR/  mild LAE/ Ventricular septum motion abnormal funtion,dyssynergy, & paradox  . WRIST SURGERY Right 2001   Social History   Social History Narrative   Karna was born in [place], [place]. She moved to [place] in 1978 when her family moved to this state. Lexxus currently lives in [place] with her husband of 35 years. They have 2 adult children and 1 grandson. 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    There is no immunization history on file for this patient.   Objective: Vital Signs: BP (!) 142/84 (BP Location: Left Arm, Patient Position: Sitting, Cuff Size: Normal)   Pulse 81   Resp 14   Ht _0  (1.702 m)   Wt 234 lb (106.1 kg)   BMI 36.65 kg/m    Physical Exam Vitals signs and nursing note reviewed.  Constitutional:      Appearance: She is well-developed.  HENT:     Head: Normocephalic and atraumatic.  Eyes:     Conjunctiva/sclera: Conjunctivae normal.  Neck:     Musculoskeletal: Normal range of motion.  Cardiovascular:     Rate and Rhythm: Normal rate and regular rhythm.     Heart sounds: Normal heart sounds.  Pulmonary:     Effort: Pulmonary effort is normal.     Breath sounds: Normal breath sounds.  Abdominal:     General: Bowel sounds are normal.     Palpations: Abdomen is soft.  Lymphadenopathy:     Cervical: No cervical adenopathy.  Skin:    General: Skin is warm and dry.     Capillary Refill: Capillary refill takes less than 2 seconds.  Neurological:     Mental Status: She is alert and oriented to person, place, and time.  Psychiatric:        Behavior: Behavior normal.      Musculoskeletal Exam: C-spine thoracic and lumbar spine good range of motion.  Shoulder joints elbow joints with good range of motion.  She has synovitis over left wrist joint.  MCPs PIPs DIPs with good range of motion with no synovitis.  Right knee joint is replaced and warm to touch which was swollen.  She is swelling over left ankle  joint.  None of the other joints were swollen.  CDAI Exam: CDAI Score: Not documented Patient Global Assessment: Not documented; Provider Global Assessment: Not documented Swollen: Not documented; Tender: Not documented Joint Exam   Not documented  There is currently no information documented on the homunculus. Go to the Rheumatology activity and complete the homunculus joint exam.  Investigation: No additional findings.  Imaging: Xr Foot 2 Views Left  Result Date: 11/27/2018 PIP and DIP narrowing was noted.  No MTP joint narrowing was noted.  No erosive changes were noted.  No intertarsal or tibiotalar joint space narrowing was noted.  A small calcaneal spur was noted. Impression: These findings are consistent with osteoarthritis of the hand.  Xr Foot 2 Views Right  Result Date: 11/27/2018 PIP and DIP narrowing was noted.  No MTP joint narrowing was noted.  No erosive changes were noted.  No intertarsal or tibiotalar joint space narrowing was noted.  A small calcaneal spur was noted. Impression: These findings are consistent with osteoarthritis of the hand.  Xr Hand 2 View Left  Result Date: 11/27/2018 Juxta-articular osteopenia was noted.  PIP DIP and MCP joint narrowing was noted.  Postsurgical changes in the [facility] joint was noted.  Cystic versus erosive changes were noted in the carpal bones.  Possible erosion was noted in the ulnar styloid. Impression: These findings are consistent with inflammatory arthritis most likely rheumatoid arthritis.  Xr Hand 2 View Right  Result Date: 11/27/2018 PIP and DIP narrowing was noted.  No significant MCP radiocarpal or intercarpal joint space narrowing was noted.  No erosive changes were noted.  Cystic changes were noted in the base of the first metacarpal. Impression: These findings are consistent with osteoarthritis of the hand.   Recent Labs: Lab Results  Component Value Date   WBC 9.6 06/04/2018   HGB 8.9 (L) 06/04/2018   PLT 309  06/04/2018   NA 141 11/29/2018   K 3.9 11/29/2018   CL 106 11/29/2018   CO2 24 11/29/2018   GLUCOSE 191 (H) 11/29/2018   BUN 11 11/29/2018   CREATININE 0.76 11/29/2018   BILITOT 0.4 11/29/2018   ALKPHOS 77 05/27/2018   AST 19 11/29/2018   ALT 33 (H) 11/29/2018   PROT 6.2 11/29/2018   PROT 6.2 11/29/2018   ALBUMIN 4.3 05/27/2018   CALCIUM 10.0 11/29/2018   GFRAA 96 11/29/2018   QFTBGOLDPLUS NEGATIVE 11/29/2018  Nov 29, 2018 UA negative, CK 54, TB Gold negative, SPEP consistent with hypogammaglobulinemia, IgG low, hepatitis B-, hepatitis C negative, HIV negative, _0 eta negative, uric acid 8.8  September 19, 2018 CBC normal, BMP normal, ESR 31, uric acid 6.6, HLA-B27 negative October 18, 2018 ESR 28, ANA negative, SSA negative, SSB negative, RF negative, anti-CCP negative, CRP 10  Speciality Comments: No specialty comments available.  Procedures:  No procedures performed Allergies: Sulfa antibiotics; Bydureon [exenatide]; Invokana [canagliflozin]; Jardiance [empagliflozin]; Lisinopril; Pravastatin; and Propofol   Assessment / Plan:     Visit Diagnoses: Inflammatory arthritis - The left wrist joint MRI showed radial ulnar joint synovitis with moderate effusion and radiocarpal synovitis with a small effusion.  Erosive changes in the carpal bones were noted on the x-ray.  She continues to has warmth and swelling in her left wrist joint.  Today she also has warmth and swelling in her left ankle joint and her right knee joint which is been replaced.  We had detailed discussion during the last visit with possibility of seronegative rheumatoid arthritis or inflammatory arthritis secondary to underlying ulcerative colitis.  We discussed use of methotrexate.  Patient is hesitant to go on methotrexate.  She wants to diagnosis to be more confirmatory.  We had detailed discussion that she can have a synovial  biopsy by Dr. Jeannie Fend to see if she has uric acid crystals or synovitis.  If she does not have  any uric acid crystals it may be worth trying methotrexate in that case.  She would like to discuss this further with Dr. Jeannie Fend.  History of ulcerative colitis - Treated with Lialda.  According to patient ulcerative colitis is not active.  High risk medication use -  SPEP-hypogammaglobulinemia.,  IgG deficiency.  I will obtain UPEP today.  I have also advised her to discuss this further with her PCP.  Hyperuricemia-patient's uric acid is elevated.  She states she has had longstanding history of hyperuricemia but never had a gout flare.  I am uncertain if the joint swelling is coming from chronic gout.  Synovectomy will be helpful.  I will hold off lowering uric acid levels at this point.  Left ankle swelling-patient had warmth and swelling in her left ankle today.  Patient states she has history of giant cell tumor resection in the past.  She has been experiencing increased swelling.  This may be part of her inflammatory arthritis.  Primary osteoarthritis of both feet-chronic pain.  Status post total right knee replacement-her left knee was also warm and swollen.  She states is more swollen than usual.  Hx of rotator cuff tear-she had repair but he still has chronic pain.  Family history of rheumatoid arthritis  Fibromyalgia-she continues to have some generalized pain.  Other medical problems are listed as follows:  Hx of thyroid cancer  Nonischemic cardiomyopathy ([place])  Essential hypertension  History of type 2 diabetes mellitus  History of gastroesophageal reflux (GERD)  Hepatic steatosis  Mixed hyperlipidemia  Depression with anxiety   Orders: Orders Placed This Encounter  Procedures  . Protein Electrophoresis,Random Urn   No orders of the defined types were placed in this encounter.   Face-to-face time spent with patient was 35 minutes. Greater than 50% of time was spent in counseling and coordination of care.  Follow-Up Instructions: Return in about 4 weeks  (around 01/07/2019) for Inflammatory arthritis.   Bo Merino, MD  Note - This record has been created using Editor, commissioning.  Chart creation errors have been sought, but may not always  have been located. Such creation errors do not reflect on  the standard of medical care.

## 2018-12-05 NOTE — Progress Notes (Signed)
We will discuss labs at the follow-up visit.

## 2018-12-10 ENCOUNTER — Other Ambulatory Visit: Payer: Self-pay

## 2018-12-10 ENCOUNTER — Ambulatory Visit: Payer: BLUE CROSS/BLUE SHIELD | Admitting: Rheumatology

## 2018-12-10 ENCOUNTER — Encounter: Payer: Self-pay | Admitting: Rheumatology

## 2018-12-10 VITALS — BP 142/84 | HR 81 | Resp 14 | Ht 67.0 in | Wt 234.0 lb

## 2018-12-10 DIAGNOSIS — Z79899 Other long term (current) drug therapy: Secondary | ICD-10-CM | POA: Diagnosis not present

## 2018-12-10 DIAGNOSIS — M797 Fibromyalgia: Secondary | ICD-10-CM

## 2018-12-10 DIAGNOSIS — M19071 Primary osteoarthritis, right ankle and foot: Secondary | ICD-10-CM | POA: Diagnosis not present

## 2018-12-10 DIAGNOSIS — M1990 Unspecified osteoarthritis, unspecified site: Secondary | ICD-10-CM

## 2018-12-10 DIAGNOSIS — Z8719 Personal history of other diseases of the digestive system: Secondary | ICD-10-CM

## 2018-12-10 DIAGNOSIS — I10 Essential (primary) hypertension: Secondary | ICD-10-CM

## 2018-12-10 DIAGNOSIS — E790 Hyperuricemia without signs of inflammatory arthritis and tophaceous disease: Secondary | ICD-10-CM

## 2018-12-10 DIAGNOSIS — Z8261 Family history of arthritis: Secondary | ICD-10-CM

## 2018-12-10 DIAGNOSIS — Z85850 Personal history of malignant neoplasm of thyroid: Secondary | ICD-10-CM

## 2018-12-10 DIAGNOSIS — I428 Other cardiomyopathies: Secondary | ICD-10-CM

## 2018-12-10 DIAGNOSIS — M19072 Primary osteoarthritis, left ankle and foot: Secondary | ICD-10-CM

## 2018-12-10 DIAGNOSIS — D801 Nonfamilial hypogammaglobulinemia: Secondary | ICD-10-CM

## 2018-12-10 DIAGNOSIS — Z8739 Personal history of other diseases of the musculoskeletal system and connective tissue: Secondary | ICD-10-CM

## 2018-12-10 DIAGNOSIS — K760 Fatty (change of) liver, not elsewhere classified: Secondary | ICD-10-CM

## 2018-12-10 DIAGNOSIS — E782 Mixed hyperlipidemia: Secondary | ICD-10-CM

## 2018-12-10 DIAGNOSIS — F418 Other specified anxiety disorders: Secondary | ICD-10-CM

## 2018-12-10 DIAGNOSIS — M25472 Effusion, left ankle: Secondary | ICD-10-CM

## 2018-12-10 DIAGNOSIS — Z96651 Presence of right artificial knee joint: Secondary | ICD-10-CM

## 2018-12-10 DIAGNOSIS — Z8639 Personal history of other endocrine, nutritional and metabolic disease: Secondary | ICD-10-CM

## 2018-12-12 LAB — PROTEIN ELECTROPHORESIS,RANDOM URN
Creatinine, Urine: 55 mg/dL (ref 20–275)
Protein/Creat Ratio: 73 mg/g creat (ref 21–161)
Protein/Creatinine Ratio: 0.073 mg/mg creat (ref 0.021–0.16)
Total Protein, Urine: 4 mg/dL — ABNORMAL LOW (ref 5–24)

## 2018-12-12 NOTE — Progress Notes (Signed)
UPEP negative

## 2018-12-16 ENCOUNTER — Other Ambulatory Visit: Payer: Self-pay | Admitting: Internal Medicine

## 2018-12-16 DIAGNOSIS — Z1231 Encounter for screening mammogram for malignant neoplasm of breast: Secondary | ICD-10-CM

## 2018-12-18 ENCOUNTER — Telehealth: Payer: Self-pay | Admitting: *Deleted

## 2018-12-18 MED ORDER — FOLIC ACID 1 MG PO TABS: 2.0000 mg | ORAL_TABLET | Freq: Every day | ORAL | 3 refills | Status: DC

## 2018-12-18 MED ORDER — METHOTREXATE 2.5 MG PO TABS: ORAL_TABLET | ORAL | 0 refills | Status: DC

## 2018-12-18 NOTE — Telephone Encounter (Signed)
Patient states she spoke with the doctor regarding the biopsy. Patient states she has concerns having the biopsy as she would have to be "put under" to have it done. Patient states she has already had two other surgeries. Patient has decided to try the MTX. Plan from note on 11/27/18 4 tablets p.o. weekly along with folic acid 2 mg p.o. daily.  We will check lab work every 2 weeks x 2 and then every 2 months to monitor for drug toxicity.  If her labs are stable at 2 weeks I will increase methotrexate to 6 tablets p.o. weekly. Okay to send prescription to the pharmacy for patient?

## 2018-12-18 NOTE — Addendum Note (Signed)
Addended by: Carole Binning on: 12/18/2018 04:55 PM   Modules accepted: Orders

## 2018-12-18 NOTE — Telephone Encounter (Signed)
Patient advised prescriptions sent to the pharmacy

## 2018-12-18 NOTE — Telephone Encounter (Signed)
Patient LMOM . Patient decided not to get wrist biopsy. Patient wants to start on MTX. Please contact patient.

## 2018-12-18 NOTE — Telephone Encounter (Signed)
Okay to start on methotrexate starting at 4 tablets p.o. weekly along with folic acid and then increase it to 6 tablets p.o. weekly.

## 2018-12-19 ENCOUNTER — Ambulatory Visit: Payer: BLUE CROSS/BLUE SHIELD | Admitting: Rheumatology

## 2018-12-30 ENCOUNTER — Other Ambulatory Visit: Payer: Self-pay

## 2018-12-30 DIAGNOSIS — Z79899 Other long term (current) drug therapy: Secondary | ICD-10-CM

## 2018-12-31 LAB — CBC WITH DIFFERENTIAL/PLATELET
Absolute Monocytes: 351 cells/uL (ref 200–950)
Basophils Absolute: 22 cells/uL (ref 0–200)
Basophils Relative: 0.4 %
Eosinophils Absolute: 92 cells/uL (ref 15–500)
Eosinophils Relative: 1.7 %
HCT: 35.1 % (ref 35.0–45.0)
Hemoglobin: 11.7 g/dL (ref 11.7–15.5)
Lymphs Abs: 1906 cells/uL (ref 850–3900)
MCH: 28.4 pg (ref 27.0–33.0)
MCHC: 33.3 g/dL (ref 32.0–36.0)
MCV: 85.2 fL (ref 80.0–100.0)
MPV: 10.4 fL (ref 7.5–12.5)
Monocytes Relative: 6.5 %
Neutro Abs: 3029 cells/uL (ref 1500–7800)
Neutrophils Relative %: 56.1 %
Platelets: 356 10*3/uL (ref 140–400)
RBC: 4.12 10*6/uL (ref 3.80–5.10)
RDW: 15.1 % — ABNORMAL HIGH (ref 11.0–15.0)
Total Lymphocyte: 35.3 %
WBC: 5.4 10*3/uL (ref 3.8–10.8)

## 2018-12-31 LAB — COMPLETE METABOLIC PANEL WITH GFR
AG Ratio: 2.2 (calc) (ref 1.0–2.5)
ALT: 53 U/L — ABNORMAL HIGH (ref 6–29)
AST: 33 U/L (ref 10–35)
Albumin: 4.2 g/dL (ref 3.6–5.1)
Alkaline phosphatase (APISO): 94 U/L (ref 37–153)
BUN: 9 mg/dL (ref 7–25)
CO2: 25 mmol/L (ref 20–32)
Calcium: 9.6 mg/dL (ref 8.6–10.4)
Chloride: 107 mmol/L (ref 98–110)
Creat: 0.87 mg/dL (ref 0.50–0.99)
GFR, Est African American: 82 mL/min/{1.73_m2} (ref >=60)
GFR, Est Non African American: 70 mL/min/{1.73_m2} (ref >=60)
Globulin: 1.9 g/dL (calc) (ref 1.9–3.7)
Glucose, Bld: 131 mg/dL — ABNORMAL HIGH (ref 65–99)
Potassium: 3.9 mmol/L (ref 3.5–5.3)
Sodium: 142 mmol/L (ref 135–146)
Total Bilirubin: 0.4 mg/dL (ref 0.2–1.2)
Total Protein: 6.1 g/dL (ref 6.1–8.1)

## 2018-12-31 NOTE — Progress Notes (Signed)
LFTs has gone up.  Patient is on increasing dose of methotrexate.  Please check what is the current dose of methotrexate she is taking.  She should stay on current dose and not to increase it any further.

## 2019-01-01 NOTE — Progress Notes (Deleted)
Office Visit Note  Patient: Meagan Allen             Date of Birth: 05-11-1956           MRN: [id number]             PCP: Dianna Rossetti, NP (Inactive) Referring: No ref. provider found Visit Date: 01/10/2019 Occupation: @GUAROCC @  Subjective:  No chief complaint on file.  Methotrexate 2.5 mg 4 tablets every 7 days (started May 2951) and folic acid 1 mg 2 tablets daily.  Most recent CBC/CMP within normal limits except for elevated LFTs on 12/30/2018.  Due for CBC/CMP in September and will monitor every 3 months.  Standing orders are in place. Recommend annual influenza, Pneumovax 23, Prevnar 13, and Shingrix as indicated for immunosuppressant therapy.    History of Present Illness: Meagan Allen is a 63 y.o. female ***   Activities of Daily Living:  Patient reports morning stiffness for *** {minute/hour:19697}.   Patient {ACTIONS;DENIES/REPORTS:21021675::"Denies"} nocturnal pain.  Difficulty dressing/grooming: {ACTIONS;DENIES/REPORTS:21021675::"Denies"} Difficulty climbing stairs: {ACTIONS;DENIES/REPORTS:21021675::"Denies"} Difficulty getting out of chair: {ACTIONS;DENIES/REPORTS:21021675::"Denies"} Difficulty using hands for taps, buttons, cutlery, and/or writing: {ACTIONS;DENIES/REPORTS:21021675::"Denies"}  No Rheumatology ROS completed.   PMFS History:  Patient Active Problem List   Diagnosis Date Noted  . S/P total knee replacement 06/03/2018  . Left rotator cuff tear 10/17/2017  . Complete rotator cuff tear 10/17/2017  . Tear of left rotator cuff 10/17/2017  . Nonischemic cardiomyopathy ([place]) 08/22/2017  . Ulcerative colitis ([place]) 08/22/2017  . Abnormal stress test 08/22/2017  . Cardiovascular stress test abnormal 08/22/2017  . Cardiomyopathy ([place]) 08/22/2017  . Postsurgical hypothyroidism 08/10/2017  . Esophageal reflux 08/10/2017  . Type 2 diabetes mellitus with diabetic polyneuropathy ([place]) 08/10/2017  . Hepatic steatosis 08/10/2017  . Palpitations 08/10/2017   . Pure hypercholesterolemia 08/10/2017  . Mixed hyperlipidemia 08/10/2017  . Gastroesophageal reflux disease 08/10/2017  . Postoperative hypothyroidism 08/10/2017  . Full thickness rotator cuff tear 08/06/2017  . Resting tremor 03/03/2013  . RUQ pain 03/03/2013  . Hx of thyroid cancer 03/03/2013  . Depression with anxiety 03/03/2013  . History of malignant neoplasm of thyroid 03/03/2013  . Right upper quadrant pain 03/03/2013  . HTN (hypertension) 03/25/2012    Past Medical History:  Diagnosis Date  . Anemia   . Anxiety   . Arthritis    osteo  . Cancer [facility])    thyroid cancer  . CHF (congestive heart failure) ([place])    diagnosed feb 2019  . Depression   . Dyspnea   . Fibromyalgia   . GERD (gastroesophageal reflux disease)   . Hearing loss of right ear   . Heart murmur   . History of colon polyps   . History of thyroid cancer 2007;  2009   dx papillary thyroid cancer w/ mets to cervical lymph nodes  . Hyperlipidemia   . Hypertension   . IBS (irritable bowel syndrome)   . Left rotator cuff tear   . Migraine   . Nonischemic cardiomyopathy [facility]) cardiologist-  dr Edyth Gunnels   per cardiac cath 08-28-2017  moderate LV dysfunction w/ diffuse hypocontractility ,  ef 35-40%  . 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 PONV (postoperative nausea and vomiting)    last colonoscopy propfol bp dropped and vomiting  . Post-surgical hypothyroidism   . Rosacea   . Type 2 diabetes mellitus ([place])    followed by pcp  .  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[place])     Family History  Problem Relation Age of Onset  . Heart disease Father        MI died age 38  . Hypertension Father   . Diabetes Father   . Arthritis Mother        rheumatoid athritis  . Stroke Mother   . Depression Mother   . Gout Brother   . Cancer Brother        thyroid CA  . Gout Brother   . Gout Brother   . Heart disease Sister        21  months old  . Asthma Son    Past Surgical History:  Procedure Laterality Date  . CARDIOVASCULAR STRESS TEST  08-20-2017   dr Agustin Cree   High risk nuclear study w/ large irreverisible defect in the basal and mid inferoseptal, inferior, inferolateral walls and apical septal, inferior walls with small peri infarct ischemia in the apical anterior & lateral walls (consistant w/ prior MI )/  no ST segment deviation noted /  nuclear stress ef 36%/  recommended cardiac cath  . CARPOMETACARPAL ([place]) FUSION OF THUMB Bilateral 2003  . COLONOSCOPY WITH PROPOFOL N/A 05/06/2013   Procedure: COLONOSCOPY WITH PROPOFOL;  Surgeon: Garlan Fair, MD;  Location: [facility];  Service: Endoscopy;  Laterality: N/A;  . D & C HYSTEROSCOPY /  RESECTION POLYP/ ROLLER BALL ABLATION  02-26-2004   dr Kathyrn Drown  [facility]  . DILATION AND CURETTAGE OF UTERUS  1984  . EXCISION MORTON'S NEUROMA Left 1996  . giant cell tumor  2011   Left ankle 2011  . JOINT REPLACEMENT    . KNEE ARTHROSCOPY  08-16-2010  dr Beola Cord  [facility];  05/ 2012   right x2 left x1  . LEFT HEART CATH AND CORONARY ANGIOGRAPHY N/A 08/28/2017   Procedure: LEFT HEART CATH AND CORONARY ANGIOGRAPHY;  Surgeon: Troy Sine, MD;  Location: [facility] [facility];  Service: Cardiovascular;  Laterality: N/A;   large normal coronary arteries in a dominant RCA system;  moderate LV systoic dysfunction w/ diffuse hypocontractility (compatible w/ nonischemic cardiomyopathy), LV end diastoilc pressure normal, LVEF 35-45% by visual estimate  . NASAL LACRIMAL DUCT SURGERY  06/14/2009  . REDO RIGHT MODIFIED RADICAL NECK DISSECTION  08-29-2007    [facility]  . SHOULDER ARTHROSCOPY WITH ROTATOR CUFF REPAIR AND SUBACROMIAL DECOMPRESSION Left 10/17/2017   Procedure: Left shoulder mini open rotator cuff repair, subacromial decompression;  Surgeon: [name], MD;  Location: [facility];  Service: Orthopedics;  Laterality: Left;  Interscalene Block  . SHOULDER SURGERY     Left mini- open rotator  cuff repair Dr. Tonita Cong 10-17-17  . TOTAL KNEE ARTHROPLASTY Right 06/03/2018   Procedure: RIGHT TOTAL KNEE ARTHROPLASTY;  Surgeon: Vickey Huger, MD;  Location: [facility];  Service: Orthopedics;  Laterality: Right;  . TOTAL THYROIDECTOMY  2007  -- [facility]   w/ dissection lymph nodes  . TRANSTHORACIC ECHOCARDIOGRAM  08-20-2017  dr Agustin Cree   ef 25-30%, severe diffuse hypokinesis with no identifiable regional variations, but with profound dyssynchrony, due to arrhythmia insuffient to evaluate LV diastolic dysfunction/  trivial MR/  mild LAE/ Ventricular septum motion abnormal funtion,dyssynergy, & paradox  . WRIST SURGERY Right 2001   Social History   Social History Narrative   [place]ome was born in [place], [place]. She moved to [place] in 1978 when her family moved to this state. [place] currently lives in [place] with her husband of 35 years. They have 2 adult  children and 1 grandson. 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    There is no immunization history on file for this patient.   Objective: Vital Signs: There were no vitals taken for this visit.   Physical Exam   Musculoskeletal Exam: ***  CDAI Exam: CDAI Score: - Patient Global: -; Provider Global: - Swollen: -; Tender: - Joint Exam   No joint exam has been documented for this visit   There is currently no information documented on the homunculus. Go to the Rheumatology activity and complete the homunculus joint exam.  Investigation: No additional findings.  Imaging: No results found.  Recent Labs: Lab Results  Component Value Date   WBC 5.4 12/30/2018   HGB 11.7 12/30/2018   PLT 356 12/30/2018   NA 142 12/30/2018   K 3.9 12/30/2018   CL 107 12/30/2018   CO2 25 12/30/2018   GLUCOSE 131 (H) 12/30/2018   BUN 9 12/30/2018   CREATININE 0.87 12/30/2018   BILITOT 0.4 12/30/2018   ALKPHOS 77 05/27/2018   AST 33 12/30/2018   ALT 53 (H) 12/30/2018   PROT 6.1 12/30/2018   ALBUMIN 4.3  05/27/2018   CALCIUM 9.6 12/30/2018   GFRAA 82 12/30/2018   QFTBGOLDPLUS NEGATIVE 11/29/2018    Speciality Comments: No specialty comments available.  Procedures:  No procedures performed Allergies: Sulfa antibiotics, Bydureon [exenatide], Invokana [canagliflozin], Jardiance [empagliflozin], Lisinopril, Pravastatin, and Propofol   Assessment / Plan:     Visit Diagnoses: No diagnosis found.   Orders: No orders of the defined types were placed in this encounter.  No orders of the defined types were placed in this encounter.   Face-to-face time spent with patient was *** minutes. Greater than 50% of time was spent in counseling and coordination of care.  Follow-Up Instructions: No follow-ups on file.   Ofilia Neas, PA-C  Note - This record has been created using Dragon software.  Chart creation errors have been sought, but may not always  have been located. Such creation errors do not reflect on  the standard of medical care.

## 2019-01-03 ENCOUNTER — Telehealth: Payer: Self-pay | Admitting: Cardiology

## 2019-01-03 MED ORDER — AMLODIPINE BESYLATE 2.5 MG PO TABS: 2.5000 mg | ORAL_TABLET | Freq: Every day | ORAL | 1 refills | Status: DC

## 2019-01-03 NOTE — Telephone Encounter (Signed)
Patient informed to start amlodipine 2.5 mg daily.

## 2019-01-03 NOTE — Telephone Encounter (Signed)
Patient specificallyy asked for [facility]. Patient is having issues since changing medicines.  Please call patient to discuss [phone]

## 2019-01-03 NOTE — Telephone Encounter (Signed)
Let's add norvasc 2.5 mg po qd

## 2019-01-03 NOTE — Telephone Encounter (Signed)
Called patient back and she reports she has been having issues with blood pressure since switching from entresto to valsartan in April 2020. She reports her blood pressure has been higher with an average of 148/86. She discussed with her pcp and her pcp increased her valsartan from 40 mg daily to 40 mg bid. However she is still concerned with blood pressure. Today before medication her readings were 140/84, 137/84, and 151/88. After valsartan her blood pressure is now currently 167/91 and heart rate 99. Yesterday she reports she was sitting on couch and felt her heart racing in her chest and at that time her heart rate was 118 and blood pressure 160/99. She reports shortness of breath as well when laying down. She was recently started on methotrexate for RA and it has now increased her liver enzymes. I will consult with Dr. Agustin Cree and follow up with patient.

## 2019-01-10 ENCOUNTER — Ambulatory Visit: Payer: BC Managed Care – PPO | Admitting: Rheumatology

## 2019-01-10 NOTE — Progress Notes (Signed)
Office Visit Note  Patient: Meagan Allen             Date of Birth: [birth year]           MRN: [id number]             PCP: Dianna Rossetti, NP (Inactive) Referring: No ref. provider found Visit Date: 01/15/2019 Occupation: @GUAROCC @  Subjective:  Pain in left wrist and both ankles.   History of Present Illness: Meagan Allen is a 63 y.o. female with history of inflammatory arthritis and ulcerative colitis.  She was placed on methotrexate 4 tablets/week about 3 weeks ago for inflammatory arthritis.  Patient states she has not noticed any improvement on methotrexate so far.  Although while she was on methotrexate she developed inguinal yeast infection which was treated with topical agents.  She states she is also developed a lesion under her right breast which is having a difficult time healing.  She had a flare of ulcerative colitis with some blood in her stool.  She is concerned about continuation of methotrexate due to elevation in her LFTs.  She is also concerned about more aggressive therapy because of hypogammaglobulinemia.  She continues to have pain and discomfort in her wrist joints and her ankles.  She has history of fibromyalgia.  She was going for massage therapy in the past.  She states recently she has been experiencing increased pain in her cervical spine and the shoulder area as she cannot go for massage therapy due to recent pandemic.  Activities of Daily Living:  Patient reports morning stiffness for 30-60 minutes.   Patient Reports nocturnal pain.  Difficulty dressing/grooming: Denies Difficulty climbing stairs: Reports Difficulty getting out of chair: Reports Difficulty using hands for taps, buttons, cutlery, and/or writing: Denies  Review of Systems  Constitutional: Positive for fatigue. Negative for night sweats, weight gain and weight loss.  HENT: Negative for mouth sores, trouble swallowing, trouble swallowing, mouth dryness and nose dryness.   Eyes: Negative for  pain, redness, visual disturbance and dryness.  Respiratory: Negative for cough, shortness of breath and difficulty breathing.   Cardiovascular: Negative for chest pain, palpitations, hypertension, irregular heartbeat and swelling in legs/feet.  Gastrointestinal: Positive for blood in stool, constipation and diarrhea.  Endocrine: Negative for increased urination.  Genitourinary: Negative for vaginal dryness.  Musculoskeletal: Positive for arthralgias, joint pain, joint swelling, myalgias, morning stiffness and myalgias. Negative for muscle weakness and muscle tenderness.  Skin: Negative for color change, rash, hair loss, skin tightness, ulcers and sensitivity to sunlight.  Allergic/Immunologic: Negative for susceptible to infections.  Neurological: Negative for dizziness, memory loss, night sweats and weakness.  Hematological: Negative for swollen glands.  Psychiatric/Behavioral: Positive for depressed mood. Negative for sleep disturbance. The patient is nervous/anxious.     PMFS History:  Patient Active Problem List   Diagnosis Date Noted  . S/P total knee replacement 06/03/2018  . Left rotator cuff tear 10/17/2017  . Complete rotator cuff tear 10/17/2017  . Tear of left rotator cuff 10/17/2017  . Nonischemic cardiomyopathy ([place]) 08/22/2017  . Ulcerative colitis ([place]) 08/22/2017  . Abnormal stress test 08/22/2017  . Cardiovascular stress test abnormal 08/22/2017  . Cardiomyopathy ([place]) 08/22/2017  . Postsurgical hypothyroidism 08/10/2017  . Esophageal reflux 08/10/2017  . Type 2 diabetes mellitus with diabetic polyneuropathy ([place]) 08/10/2017  . Hepatic steatosis 08/10/2017  . Palpitations 08/10/2017  . Pure hypercholesterolemia 08/10/2017  . Mixed hyperlipidemia 08/10/2017  . Gastroesophageal reflux disease 08/10/2017  . Postoperative hypothyroidism 08/10/2017  .  Full thickness rotator cuff tear 08/06/2017  . Resting tremor 03/03/2013  . RUQ pain 03/03/2013  . Hx of thyroid  cancer 03/03/2013  . Depression with anxiety 03/03/2013  . History of malignant neoplasm of thyroid 03/03/2013  . Right upper quadrant pain 03/03/2013  . HTN (hypertension) 03/25/2012    Past Medical History:  Diagnosis Date  . Anemia   . Anxiety   . Arthritis    osteo  . Cancer [facility])    thyroid cancer  . CHF (congestive heart failure) ([place])    diagnosed feb 2019  . Depression   . Dyspnea   . Fibromyalgia   . GERD (gastroesophageal reflux disease)   . Hearing loss of right ear   . Heart murmur   . History of colon polyps   . History of thyroid cancer 2007;  2009   dx papillary thyroid cancer w/ mets to cervical lymph nodes  . Hyperlipidemia   . Hypertension   . IBS (irritable bowel syndrome)   . Left rotator cuff tear   . Migraine   . Nonischemic cardiomyopathy [facility]) cardiologist-  dr Edyth Gunnels   per cardiac cath 08-28-2017  moderate LV dysfunction w/ diffuse hypocontractility ,  ef 35-40%  . 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 PONV (postoperative nausea and vomiting)    last colonoscopy propfol bp dropped and vomiting  . Post-surgical hypothyroidism   . Rosacea   . Type 2 diabetes mellitus ([place])    followed by pcp  . 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[place])     Family History  Problem Relation Age of Onset  . Heart disease Father        MI died age 62  . Hypertension Father   . Diabetes Father   . Arthritis Mother        rheumatoid athritis  . Stroke Mother   . Depression Mother   . Gout Brother   . Cancer Brother        thyroid CA  . Gout Brother   . Gout Brother   . Heart disease Sister        26 months old  . Asthma Son    Past Surgical History:  Procedure Laterality Date  . CARDIOVASCULAR STRESS TEST  08-20-2017   dr Agustin Cree   High risk nuclear study w/ large irreverisible defect in the basal and mid inferoseptal, inferior, inferolateral walls and apical septal,  inferior walls with small peri infarct ischemia in the apical anterior & lateral walls (consistant w/ prior MI )/  no ST segment deviation noted /  nuclear stress ef 36%/  recommended cardiac cath  . CARPOMETACARPAL ([place]) FUSION OF THUMB Bilateral 2003  . COLONOSCOPY WITH PROPOFOL N/A 05/06/2013   Procedure: COLONOSCOPY WITH PROPOFOL;  Surgeon: Garlan Fair, MD;  Location: [facility];  Service: Endoscopy;  Laterality: N/A;  . D & C HYSTEROSCOPY /  RESECTION POLYP/ ROLLER BALL ABLATION  02-26-2004   dr Kathyrn Drown  [facility]  . DILATION AND CURETTAGE OF UTERUS  1984  . EXCISION MORTON'S NEUROMA Left 1996  . giant cell tumor  2011   Left ankle 2011  . JOINT REPLACEMENT    . KNEE ARTHROSCOPY  08-16-2010  dr Beola Cord  [facility];  05/ 2012   right x2 left x1  . LEFT HEART CATH AND CORONARY ANGIOGRAPHY N/A 08/28/2017   Procedure: LEFT HEART CATH AND CORONARY ANGIOGRAPHY;  Surgeon: Troy Sine, MD;  Location: [facility];  Service: Cardiovascular;  Laterality: N/A;   large normal coronary arteries in a dominant RCA system;  moderate LV systoic dysfunction w/ diffuse hypocontractility (compatible w/ nonischemic cardiomyopathy), LV end diastoilc pressure normal, LVEF 35-45% by visual estimate  . NASAL LACRIMAL DUCT SURGERY  06/14/2009  . REDO RIGHT MODIFIED RADICAL NECK DISSECTION  08-29-2007    [facility]  . SHOULDER ARTHROSCOPY WITH ROTATOR CUFF REPAIR AND SUBACROMIAL DECOMPRESSION Left 10/17/2017   Procedure: Left shoulder mini open rotator cuff repair, subacromial decompression;  Surgeon: [name], MD;  Location: [facility];  Service: Orthopedics;  Laterality: Left;  Interscalene Block  . SHOULDER SURGERY     Left mini- open rotator cuff repair Dr. Tonita Cong 10-17-17  . TOTAL KNEE ARTHROPLASTY Right 06/03/2018   Procedure: RIGHT TOTAL KNEE ARTHROPLASTY;  Surgeon: Vickey Huger, MD;  Location: [facility];  Service: Orthopedics;  Laterality: Right;  . TOTAL THYROIDECTOMY  2007  -- [facility]   w/ dissection lymph nodes  .  TRANSTHORACIC ECHOCARDIOGRAM  08-20-2017  dr Agustin Cree   ef 25-30%, severe diffuse hypokinesis with no identifiable regional variations, but with profound dyssynchrony, due to arrhythmia insuffient to evaluate LV diastolic dysfunction/  trivial MR/  mild LAE/ Ventricular septum motion abnormal funtion,dyssynergy, & paradox  . WRIST SURGERY Right 2001   Social History   Social History Narrative   Meagan Allen was born in [place], [place]. She moved to [place] in 1978 when her family moved to this state. Orly currently lives in [place] with her husband of 76 years. They have 2 adult children and 1 grandson. 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   Immunization History  Administered Date(s) Administered  . Influenza,inj,quad, With Preservative 04/16/2017     Objective: Vital Signs: BP (!) 141/83 (BP Location: Left Arm, Patient Position: Sitting)   Resp 13   Ht 5' 7"  (1.702 m)   Wt 230 lb (104.3 kg)   BMI 36.02 kg/m    Physical Exam Vitals signs and nursing note reviewed.  Constitutional:      Appearance: She is well-developed.  HENT:     Head: Normocephalic and atraumatic.  Eyes:     Conjunctiva/sclera: Conjunctivae normal.  Neck:     Musculoskeletal: Normal range of motion.  Cardiovascular:     Rate and Rhythm: Normal rate and regular rhythm.     Heart sounds: Normal heart sounds.  Pulmonary:     Effort: Pulmonary effort is normal.     Breath sounds: Normal breath sounds.  Abdominal:     General: Bowel sounds are normal.     Palpations: Abdomen is soft.  Lymphadenopathy:     Cervical: No cervical adenopathy.  Skin:    General: Skin is warm and dry.     Capillary Refill: Capillary refill takes less than 2 seconds.  Neurological:     Mental Status: She is alert and oriented to person, place, and time.  Psychiatric:        Behavior: Behavior normal.      Musculoskeletal Exam: She had good range of motion of her cervical thoracic and  lumbar spine.  Shoulder joints and elbow joints with good range of motion.  She had synovitis and tenderness on palpation of her left wrist joint.  There was no swelling over MCPs or PIPs.  She has warmth and swelling in her bilateral ankle joints more prominent on the left side.  Right total knee replacement is doing well.  CDAI  Exam: CDAI Score: 3  Patient Global: 5 mm; Provider Global: 5 mm Swollen: 3 ; Tender: 3  Joint Exam      Right  Left  Wrist     Swollen Tender  Ankle  Swollen Tender  Swollen Tender     Investigation: No additional findings.  Imaging: No results found.  Recent Labs: Lab Results  Component Value Date   WBC 5.4 12/30/2018   HGB 11.7 12/30/2018   PLT 356 12/30/2018   NA 142 12/30/2018   K 3.9 12/30/2018   CL 107 12/30/2018   CO2 25 12/30/2018   GLUCOSE 131 (H) 12/30/2018   BUN 9 12/30/2018   CREATININE 0.87 12/30/2018   BILITOT 0.4 12/30/2018   ALKPHOS 77 05/27/2018   AST 33 12/30/2018   ALT 53 (H) 12/30/2018   PROT 6.1 12/30/2018   ALBUMIN 4.3 05/27/2018   CALCIUM 9.6 12/30/2018   GFRAA 82 12/30/2018   QFTBGOLDPLUS NEGATIVE 11/29/2018    Speciality Comments: No specialty comments available.  Procedures:  No procedures performed Allergies: Sulfa antibiotics, Bydureon [exenatide], Invokana [canagliflozin], Jardiance [empagliflozin], Lisinopril, Pravastatin, and Propofol   Assessment / Plan:     Visit Diagnoses: Inflammatory arthritis -patient continues to have swelling and inflammation in her left wrist joint and bilateral ankle joints.  She was placed on methotrexate which she took only 2 doses at 10 mg p.o. weekly and developed mild elevation of LFTs.  She states while she was on methotrexate she developed inguinal yeast infection and an infection in her right breast.  She does not want to take immunosuppressive therapy at this point which is aggressive and will cause increased risk of infections.  She is also concerned about  hypogammaglobulinemia.  I will hold off immunosuppressive therapy until she is ready.  I offered her left wrist cortisone injection.  She states she has had in the past but no benefit was noted.  Sulfasalazine could be an option which I discussed with her briefly.  Have also advised her to discuss this further with her gastroenterologist.  High risk medication use - Methotrexate 2.5 mg 6 tablets every 7 days and folic acid 1 mg 2 tablets daily.  Most recent CBC/CMP within normal limits except elevated LFTs on 12/30/2018.  We decided to discontinue methotrexate as she has not noticed any benefit on 4 tablets/week.  She is concerned about elevated LFTs and frequent infections on methotrexate.  She will discuss possible use of sulfasalazine with her primary care physician.  I also discussed that anti-TNF's like Humira or Cimzia could be more effective with her ulcerative colitis.  She is concerned about the increased risk of infection.  Recommend annual influenza, Pneumovax 23, Prevnar 13, and Shingrix as indicated for immunosuppressant therapy.   - Plan: CBC with Differential/Platelet, COMPLETE METABOLIC PANEL WITH GFR,   Elevated LFTs -on methotrexate.  She will discontinue methotrexate.  We will check labs today.  History of ulcerative colitis - Treated with Lialda.  She has appointment coming up with gastroenterologist in couple of weeks.  Primary osteoarthritis of both feet -proper fitting shoes were discussed.  Hyperuricemia -she has no symptoms of gout.  Status post total right knee replacement -she has mild chronic discomfort.  Family history of rheumatoid arthritis   Fibromyalgia -she is having a flare of fibromyalgia as she cannot get massage therapy right now.  Hypogammaglobulinemia (H[facility]) - SPEP-hypogammaglobulinemia.,  IgG deficiency.  I will refer her to hematology for further evaluation.  As she will need more aggressive immunosuppressive  therapy in the future.  Other medical problems  are listed as follows:  Hx of rotator cuff tear  History of gastroesophageal reflux (GERD)   Nonischemic cardiomyopathy ([place])   Depression with anxiety   Hepatic steatosis  Hx of thyroid cancer   History of type 2 diabetes mellitus  Essential hypertension  Mixed hyperlipidemia   Orders: Orders Placed This Encounter  Procedures  . CBC with Differential/Platelet  . COMPLETE METABOLIC PANEL WITH GFR   No orders of the defined types were placed in this encounter.    Follow-Up Instructions: Return in about 3 months (around 04/17/2019) for inflammatory arthritis, UC.   Bo Merino, MD  Note - This record has been created using Editor, commissioning.  Chart creation errors have been sought, but may not always  have been located. Such creation errors do not reflect on  the standard of medical care.

## 2019-01-11 ENCOUNTER — Other Ambulatory Visit: Payer: Self-pay | Admitting: Rheumatology

## 2019-01-13 NOTE — Telephone Encounter (Signed)
Last Visit: 12/10/18 Next Visit: 01/15/19 Labs: 12/30/18 increased LFT's patient to stay on 4 tabs MTX  Okay to refill per Dr. Estanislado Pandy

## 2019-01-15 ENCOUNTER — Encounter: Payer: Self-pay | Admitting: Rheumatology

## 2019-01-15 ENCOUNTER — Other Ambulatory Visit: Payer: Self-pay

## 2019-01-15 ENCOUNTER — Ambulatory Visit: Payer: BC Managed Care – PPO | Admitting: Rheumatology

## 2019-01-15 VITALS — BP 141/83 | Resp 13 | Ht 67.0 in | Wt 230.0 lb

## 2019-01-15 DIAGNOSIS — Z85850 Personal history of malignant neoplasm of thyroid: Secondary | ICD-10-CM

## 2019-01-15 DIAGNOSIS — I428 Other cardiomyopathies: Secondary | ICD-10-CM

## 2019-01-15 DIAGNOSIS — R945 Abnormal results of liver function studies: Secondary | ICD-10-CM | POA: Diagnosis not present

## 2019-01-15 DIAGNOSIS — Z8261 Family history of arthritis: Secondary | ICD-10-CM

## 2019-01-15 DIAGNOSIS — D801 Nonfamilial hypogammaglobulinemia: Secondary | ICD-10-CM

## 2019-01-15 DIAGNOSIS — E782 Mixed hyperlipidemia: Secondary | ICD-10-CM

## 2019-01-15 DIAGNOSIS — R7989 Other specified abnormal findings of blood chemistry: Secondary | ICD-10-CM

## 2019-01-15 DIAGNOSIS — Z79899 Other long term (current) drug therapy: Secondary | ICD-10-CM

## 2019-01-15 DIAGNOSIS — M19072 Primary osteoarthritis, left ankle and foot: Secondary | ICD-10-CM

## 2019-01-15 DIAGNOSIS — I10 Essential (primary) hypertension: Secondary | ICD-10-CM

## 2019-01-15 DIAGNOSIS — F418 Other specified anxiety disorders: Secondary | ICD-10-CM

## 2019-01-15 DIAGNOSIS — M19071 Primary osteoarthritis, right ankle and foot: Secondary | ICD-10-CM

## 2019-01-15 DIAGNOSIS — Z8639 Personal history of other endocrine, nutritional and metabolic disease: Secondary | ICD-10-CM

## 2019-01-15 DIAGNOSIS — Z8719 Personal history of other diseases of the digestive system: Secondary | ICD-10-CM | POA: Diagnosis not present

## 2019-01-15 DIAGNOSIS — M1990 Unspecified osteoarthritis, unspecified site: Secondary | ICD-10-CM

## 2019-01-15 DIAGNOSIS — K760 Fatty (change of) liver, not elsewhere classified: Secondary | ICD-10-CM

## 2019-01-15 DIAGNOSIS — E790 Hyperuricemia without signs of inflammatory arthritis and tophaceous disease: Secondary | ICD-10-CM

## 2019-01-15 DIAGNOSIS — Z96651 Presence of right artificial knee joint: Secondary | ICD-10-CM

## 2019-01-15 DIAGNOSIS — Z8739 Personal history of other diseases of the musculoskeletal system and connective tissue: Secondary | ICD-10-CM

## 2019-01-15 DIAGNOSIS — M797 Fibromyalgia: Secondary | ICD-10-CM

## 2019-01-16 ENCOUNTER — Telehealth: Payer: Self-pay | Admitting: Internal Medicine

## 2019-01-16 LAB — CBC WITH DIFFERENTIAL/PLATELET
Absolute Monocytes: 479 cells/uL (ref 200–950)
Basophils Absolute: 28 cells/uL (ref 0–200)
Basophils Relative: 0.5 %
Eosinophils Absolute: 61 cells/uL (ref 15–500)
Eosinophils Relative: 1.1 %
HCT: 37.8 % (ref 35.0–45.0)
Hemoglobin: 12.5 g/dL (ref 11.7–15.5)
Lymphs Abs: 1881 cells/uL (ref 850–3900)
MCH: 28.2 pg (ref 27.0–33.0)
MCHC: 33.1 g/dL (ref 32.0–36.0)
MCV: 85.3 fL (ref 80.0–100.0)
MPV: 10 fL (ref 7.5–12.5)
Monocytes Relative: 8.7 %
Neutro Abs: 3053 cells/uL (ref 1500–7800)
Neutrophils Relative %: 55.5 %
Platelets: 323 10*3/uL (ref 140–400)
RBC: 4.43 10*6/uL (ref 3.80–5.10)
RDW: 15.3 % — ABNORMAL HIGH (ref 11.0–15.0)
Total Lymphocyte: 34.2 %
WBC: 5.5 10*3/uL (ref 3.8–10.8)

## 2019-01-16 LAB — COMPLETE METABOLIC PANEL WITH GFR
AG Ratio: 2.4 (calc) (ref 1.0–2.5)
ALT: 41 U/L — ABNORMAL HIGH (ref 6–29)
AST: 26 U/L (ref 10–35)
Albumin: 4.7 g/dL (ref 3.6–5.1)
Alkaline phosphatase (APISO): 100 U/L (ref 37–153)
BUN: 9 mg/dL (ref 7–25)
CO2: 27 mmol/L (ref 20–32)
Calcium: 10.6 mg/dL — ABNORMAL HIGH (ref 8.6–10.4)
Chloride: 105 mmol/L (ref 98–110)
Creat: 0.81 mg/dL (ref 0.50–0.99)
GFR, Est African American: 89 mL/min/{1.73_m2} (ref >=60)
GFR, Est Non African American: 77 mL/min/{1.73_m2} (ref >=60)
Globulin: 2 g/dL (calc) (ref 1.9–3.7)
Glucose, Bld: 97 mg/dL (ref 65–99)
Potassium: 4.3 mmol/L (ref 3.5–5.3)
Sodium: 141 mmol/L (ref 135–146)
Total Bilirubin: 0.4 mg/dL (ref 0.2–1.2)
Total Protein: 6.7 g/dL (ref 6.1–8.1)

## 2019-01-16 NOTE — Progress Notes (Signed)
Calcium is mildly elevated.  Please advise patient to avoid calcium supplement.  Liver function is a still mildly elevated but stable.

## 2019-01-16 NOTE — Telephone Encounter (Signed)
Received a new hem referral from Dr. Estanislado Pandy for Hypogammaglobulinemia. Pt has been cld and scheduled to see Dr. Walden Field on 7/22 at 1pm. Aware to arrive 20 minutes early.

## 2019-01-23 ENCOUNTER — Telehealth: Payer: Self-pay | Admitting: Hematology

## 2019-01-23 NOTE — Telephone Encounter (Signed)
Erroneous encounter

## 2019-01-24 ENCOUNTER — Telehealth: Payer: Self-pay | Admitting: Internal Medicine

## 2019-01-24 NOTE — Telephone Encounter (Signed)
Returned call re message left to reschedule. Left message asking patient to return call re rescheduling.

## 2019-01-29 ENCOUNTER — Telehealth: Payer: Self-pay | Admitting: Hematology

## 2019-01-29 NOTE — Telephone Encounter (Signed)
Per patient moved new patient appointment from 7/22 to 7/29 and from [facility] to [place]. VH last day in office 7/31.

## 2019-01-31 ENCOUNTER — Ambulatory Visit: Payer: BLUE CROSS/BLUE SHIELD

## 2019-02-05 ENCOUNTER — Encounter: Payer: BC Managed Care – PPO | Admitting: Internal Medicine

## 2019-02-11 NOTE — Progress Notes (Signed)
HEMATOLOGY/ONCOLOGY CONSULTATION NOTE  Date of Service: 02/12/2019  Patient Care Team: Dianna Rossetti, NP (Inactive) as PCP - General (Nurse Practitioner) [name], MD as Consulting Physician (Orthopedic Surgery)  CHIEF COMPLAINTS/PURPOSE OF CONSULTATION:  Hypogammaglobulinemia   HISTORY OF PRESENTING ILLNESS:   Meagan Allen is a wonderful 63 y.o. female who has been referred to [place] by Dr. Estanislado Pandy for evaluation and management of hypogammaglobulinemia. The pt reports that she is doing well overall.  The pt reports that she had left wrist pain that started in 07/2018. After a week, it began to swell. She bounced from doctor to doctor until she saw Dr. Estanislado Pandy. She had MRIs which revealed inflammation and fluid. The pt was put on Methotrexate which caused a large boil on her right breast and rectal bleeding. She only took it for about a month.  Now, she experiences stiffness in her left wrist in the morning. The swelling has started to creep distally towards her fingers. She also has some swelling in her left ankle.  Her blood counts have been low since 2018 and she had multiple blood transfusions last year.  The pt was diagnosed with ulcerative colitis in September 2018. She was on steroids on and off for 3-4 months, and since then she has been taking Lialda.   She had thyroid cancer and her thyroid was surgically removed years ago. She had a high dose of radioactive iodine, which caused dry eyes. This is currently being managed by Dr. Buddy Duty, endocrinologist.  She had a tumor in her ankle that was removed surgically. She did not have radiation.  Most recent lab results (01/15/2019) of CBC w/ diff and CMP is as follows: all values are WNL except for calcium at 10.6, ALT at 41, RDW at 15.3 11/29/2018 SPEP revealed all values WNL except gamma globulin at 0.6 11/29/2018 IgG at 567  On review of systems, pt reports dry eyes, right belly pain and denies salivary insufficiency,  weight loss, mouth sores and any other symptoms.   On PMHx the pt reports: -thyroid cancer radioactive iodine -ulcerative colitis -rotator cuff repair -fibromyalgia since the 90s -well managed diabetes -fatty liver -hx of numerous cysts and boils on her back and chest -deafness on the right On Family Hx the pt reports that her daughter passed away 2 years ago from stage IV melanoma   MEDICAL HISTORY:  Past Medical History:  Diagnosis Date  . Anemia   . Anxiety   . Arthritis    osteo  . Cancer [facility])    thyroid cancer  . CHF (congestive heart failure) ([place])    diagnosed feb 2019  . Depression   . Dyspnea   . Fibromyalgia   . GERD (gastroesophageal reflux disease)   . Hearing loss of right ear   . Heart murmur   . History of colon polyps   . History of thyroid cancer 2007;  2009   dx papillary thyroid cancer w/ mets to cervical lymph nodes  . Hyperlipidemia   . Hypertension   . IBS (irritable bowel syndrome)   . Left rotator cuff tear   . Migraine   . Nonischemic cardiomyopathy [facility]) cardiologist-  dr Edyth Gunnels   per cardiac cath 08-28-2017  moderate LV dysfunction w/ diffuse hypocontractility ,  ef 35-40%  . 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 PONV (postoperative nausea and vomiting)    last colonoscopy propfol bp dropped  and vomiting  . Post-surgical hypothyroidism   . Rosacea   . Type 2 diabetes mellitus ([place])    followed by pcp  . 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 CARDIOVASCULAR STRESS TEST  08-20-2017   dr Agustin Cree   High risk nuclear study w/ large irreverisible defect in the basal and mid inferoseptal, inferior, inferolateral walls and apical septal, inferior walls with small peri infarct ischemia in the apical anterior & lateral walls (consistant w/ prior MI )/  no ST segment deviation noted /   nuclear stress ef 36%/  recommended cardiac cath  . CARPOMETACARPAL ([place]) FUSION OF THUMB Bilateral 2003  . COLONOSCOPY WITH PROPOFOL N/A 05/06/2013   Procedure: COLONOSCOPY WITH PROPOFOL;  Surgeon: Garlan Fair, MD;  Location: [facility];  Service: Endoscopy;  Laterality: N/A;  . D & C HYSTEROSCOPY /  RESECTION POLYP/ ROLLER BALL ABLATION  02-26-2004   dr Kathyrn Drown  [facility]  . DILATION AND CURETTAGE OF UTERUS  1984  . EXCISION MORTON'S NEUROMA Left 1996  . giant cell tumor  2011   Left ankle 2011  . JOINT REPLACEMENT    . KNEE ARTHROSCOPY  08-16-2010  dr Beola Cord  [facility];  05/ 2012   right x2 left x1  . LEFT HEART CATH AND CORONARY ANGIOGRAPHY N/A 08/28/2017   Procedure: LEFT HEART CATH AND CORONARY ANGIOGRAPHY;  Surgeon: Troy Sine, MD;  Location: [place] [facility];  Service: Cardiovascular;  Laterality: N/A;   large normal coronary arteries in a dominant RCA system;  moderate LV systoic dysfunction w/ diffuse hypocontractility (compatible w/ nonischemic cardiomyopathy), LV end diastoilc pressure normal, LVEF 35-45% by visual estimate  . NASAL LACRIMAL DUCT SURGERY  06/14/2009  . REDO RIGHT MODIFIED RADICAL NECK DISSECTION  08-29-2007    [facility]  . SHOULDER ARTHROSCOPY WITH ROTATOR CUFF REPAIR AND SUBACROMIAL DECOMPRESSION Left 10/17/2017   Procedure: Left shoulder mini open rotator cuff repair, subacromial decompression;  Surgeon: [name], MD;  Location: [facility];  Service: Orthopedics;  Laterality: Left;  Interscalene Block  . SHOULDER SURGERY     Left mini- open rotator cuff repair Dr. Tonita Cong 10-17-17  . TOTAL KNEE ARTHROPLASTY Right 06/03/2018   Procedure: RIGHT TOTAL KNEE ARTHROPLASTY;  Surgeon: Vickey Huger, MD;  Location: [facility];  Service: Orthopedics;  Laterality: Right;  . TOTAL THYROIDECTOMY  2007  -- [facility]   w/ dissection lymph nodes  . TRANSTHORACIC ECHOCARDIOGRAM  08-20-2017  dr Agustin Cree   ef 25-30%, severe diffuse hypokinesis with no identifiable regional variations, but with  profound dyssynchrony, due to arrhythmia insuffient to evaluate LV diastolic dysfunction/  trivial MR/  mild LAE/ Ventricular septum motion abnormal funtion,dyssynergy, & paradox  . WRIST SURGERY Right 2001    SOCIAL HISTORY: Social History   Socioeconomic History  . Marital status: Married    Spouse name: Jeneen Rinks  . Number of children: 2  . Years of education: 38  . Highest education level: Not on file  Occupational History  . Occupation: Banker for [employer]: [place]  Social Needs  . Financial resource strain: Not on file  . Food insecurity    Worry: Not on file    Inability: Not on file  . Transportation needs    Medical: Not on file    Non-medical: Not on file  Tobacco Use  . Smoking status: Former Smoker    Packs/day: 3.00    Years: 10.00  Pack years: 30.00    Types: Cigarettes    Quit date: 07/17/1980    Years since quitting: 38.6  . Smokeless tobacco: Never Used  Substance and Sexual Activity  . Alcohol use: No  . Drug use: No  . Sexual activity: Yes    Birth control/protection: Post-menopausal  Lifestyle  . Physical activity    Days per week: Not on file    Minutes per session: Not on file  . Stress: Not on file  Relationships  . Social Herbalist on phone: Not on file    Gets together: Not on file    Attends religious service: Not on file    Active member of club or organization: Not on file    Attends meetings of clubs or organizations: Not on file    Relationship status: Not on file  . Intimate partner violence    Fear of current or ex partner: Not on file    Emotionally abused: Not on file    Physically abused: Not on file    Forced sexual activity: Not on file  Other Topics Concern  . Not on file  Social History Narrative   Marcene was born in [place], [place]. She moved to [place] in 1978 when her family moved to this state. Dajsha currently lives in [place] with her husband of 16 years. They  have 2 adult children and 1 grandson. 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    FAMILY HISTORY: Family History  Problem Relation Age of Onset  . Heart disease Father        MI died age 75  . Hypertension Father   . Diabetes Father   . Arthritis Mother        rheumatoid athritis  . Stroke Mother   . Depression Mother   . Gout Brother   . Cancer Brother        thyroid CA  . Gout Brother   . Gout Brother   . Heart disease Sister        95 months old  . Asthma Son     ALLERGIES:  is allergic to sulfa antibiotics; bydureon [exenatide]; invokana [canagliflozin]; jardiance [empagliflozin]; lisinopril; pravastatin; and propofol.  MEDICATIONS:  Current Outpatient Medications  Medication Sig Dispense Refill  . amLODipine (NORVASC) 2.5 MG tablet Take 1 tablet (2.5 mg total) by mouth daily. 90 tablet 1  . aspirin EC 81 MG tablet Take 81 mg by mouth daily.    Marland Kitchen buPROPion (WELLBUTRIN XL) 150 MG 24 hr tablet Take 450 mg by mouth every morning.     . cetirizine (ZYRTEC) 10 MG tablet Take 10 mg by mouth daily.    . clonazePAM (KLONOPIN) 0.5 MG tablet Take 0.5 mg by mouth at bedtime.     . ferrous sulfate 325 (65 FE) MG tablet Take 325 mg by mouth daily.  6  . folic acid (FOLVITE) 1 MG tablet Take 2 tablets (2 mg total) by mouth daily. 180 tablet 3  . Insulin Glargine (LANTUS SOLOSTAR) 100 UNIT/ML Solostar Pen Inject 80 Units into the skin daily at 10 pm.     . levothyroxine (SYNTHROID, LEVOTHROID) 137 MCG tablet Take 137 mcg by mouth at bedtime.     Marland Kitchen linaclotide (LINZESS) 72 MCG capsule Take 72 mcg by mouth every other day.     . liraglutide (VICTOZA) 18 MG/3ML SOPN Inject 1.2 mg into the skin at bedtime.     Marland Kitchen  mesalamine (LIALDA) 1.2 g EC tablet Take 2.4 g by mouth 2 (two) times daily.    . metFORMIN (GLUCOPHAGE-XR) 500 MG 24 hr tablet Take 1,000 mg by mouth every evening.    . methocarbamol (ROBAXIN) 500 MG tablet Take 1-2 tablets (500-1,000 mg total) by  mouth every 6 (six) hours as needed for muscle spasms. 60 tablet 0  . methotrexate (RHEUMATREX) 2.5 MG tablet Take 4 tablets (10 mg total) by mouth once a week. PROTECT FROM LIGHT. 48 tablet 0  . metoprolol succinate (TOPROL-XL) 100 MG 24 hr tablet TAKE 1 TABLET BY MOUTH DAILY WITH OR IMMEDIATELY FOLLOWING A MEAL. 90 tablet 3  . Misc Natural Products (COSAMIN ASU ADVANCED FORMULA) CAPS Take 2 capsules by mouth daily.     . Multiple Vitamins-Minerals (HAIR SKIN & NAILS ADVANCED PO) Take 1 tablet by mouth daily.    . pantoprazole (PROTONIX) 40 MG tablet Take 40 mg by mouth daily.    . polyethylene glycol powder (GLYCOLAX/MIRALAX) powder Take 17 g by mouth daily as needed (for constipation.).     Marland Kitchen valsartan (DIOVAN) 80 MG tablet      No current facility-administered medications for this visit.     REVIEW OF SYSTEMS:    A 10+ POINT REVIEW OF SYSTEMS WAS OBTAINED including neurology, dermatology, psychiatry, cardiac, respiratory, lymph, extremities, GI, GU, Musculoskeletal, constitutional, breasts, reproductive, HEENT.  All pertinent positives are noted in the HPI.  All others are negative.   PHYSICAL EXAMINATION: ECOG PERFORMANCE STATUS: 2 - Symptomatic, <50% confined to bed   Vitals:   02/12/19 1310  BP: (!) 156/84  Pulse: 87  Resp: 18  Temp: 98.9 F (37.2 C)  SpO2: 98%   Filed Weights   02/12/19 1310  Weight: 230 lb 8 oz (104.6 kg)   .Body mass index is 36.1 kg/m.  GENERAL:alert, in no acute distress and comfortable SKIN: no acute rashes, no significant lesions EYES: conjunctiva are pink and non-injected, sclera anicteric OROPHARYNX: MMM, no exudates, no oropharyngeal erythema or ulceration NECK: supple, no JVD LYMPH:  no palpable lymphadenopathy in the cervical, axillary or inguinal regions LUNGS: clear to auscultation b/l with normal respiratory effort HEART: regular rate & rhythm ABDOMEN:  normoactive bowel sounds , non tender, not distended. Extremity: no pedal edema  PSYCH: alert & oriented x 3 with fluent speech NEURO: no focal motor/sensory deficits  LABORATORY DATA:  I have reviewed the data as listed  REFERRAL LABS -- SPEP and IgG, IgA, IgM: Component     Latest Ref Rng & Units 11/29/2018  Total Protein     6.1 - 8.1 g/dL 6.2  Albumin ELP     3.8 - 4.8 g/dL 3.9  Alpha 1     0.2 - 0.3 g/dL 0.3  Alpha 2     0.5 - 0.9 g/dL 0.7  Beta Globulin     0.4 - 0.6 g/dL 0.4  Beta 2     0.2 - 0.5 g/dL 0.3  Gamma Globulin     0.8 - 1.7 g/dL 0.6 (L)  SPE Interp.        Immunoglobulin A     70 - 320 mg/dL 96  IgG (Immunoglobin G), Serum     600 - 1,540 mg/dL 567 (L)  IgM, Serum     50 - 300 mg/dL 72    . CBC Latest Ref Rng & Units 01/15/2019 12/30/2018 06/04/2018  WBC 3.8 - 10.8 Thousand/uL 5.5 5.4 9.6  Hemoglobin 11.7 - 15.5 g/dL 12.5 11.7 8.9(L)  Hematocrit 35.0 - 45.0 % 37.8 35.1 29.6(L)  Platelets 140 - 400 Thousand/uL 323 356 309    . CMP Latest Ref Rng & Units 01/15/2019 12/30/2018 11/29/2018  Glucose 65 - 99 mg/dL 97 131(H) -  BUN 7 - 25 mg/dL 9 9 -  Creatinine 0.50 - 0.99 mg/dL 0.81 0.87 -  Sodium 135 - 146 mmol/L 141 142 -  Potassium 3.5 - 5.3 mmol/L 4.3 3.9 -  Chloride 98 - 110 mmol/L 105 107 -  CO2 20 - 32 mmol/L 27 25 -  Calcium 8.6 - 10.4 mg/dL 10.6(H) 9.6 -  Total Protein 6.1 - 8.1 g/dL 6.7 6.1 6.2  Total Bilirubin 0.2 - 1.2 mg/dL 0.4 0.4 -  Alkaline Phos 38 - 126 U/L - - -  AST 10 - 35 U/L 26 33 -  ALT 6 - 29 U/L 41(H) 53(H) -     RADIOGRAPHIC STUDIES: I have personally reviewed the radiological images as listed and agreed with the findings in the report. No results found.  ASSESSMENT & PLAN:   #1 hypogammaglobulinemia -mild IgG deficiency IgG level of 567 -likely multifactorial -- hx of high dose steroids, other immunosuppressants, diabetes, radioactive iodine exposure, ulcerative colitis  PLAN: -Discussed patient's most recent labs from 01/15/2019, HIV neg, neg M protein suggesting against a plasma cell dyscrasia  -Explained that there is not a significant risk of infection unless the pt's IgG drops below 500 -Discussed that her low IgG is multifactorial and could be due to her hx of high dose steroids, other immunosuppressants, diabetes, radioactive iodine exposure, ulcerative colitis -No indications for IVIg at this time. Would consider IgG subclass analysis to see if there is a subclass that is deficient -Primary bone marrow disease is unlikely at this time. -Recommend that PCP test her for MRSA with nasal swab and rx if positive if she continues to have recurrent skin boils Dm2 additional risk factor for infection. -Lab work today -Will see the pt back in 10 days by phone   - labs today -phone visit in 10 days   Orders Placed This Encounter  Procedures  . CBC with Differential/Platelet    Standing Status:   Future    Number of Occurrences:   1    Standing Expiration Date:   03/18/2020  . Ferritin    Standing Status:   Future    Number of Occurrences:   1    Standing Expiration Date:   02/12/2020  . Iron and TIBC    Standing Status:   Future    Number of Occurrences:   1    Standing Expiration Date:   02/12/2020  . Vitamin B12    Standing Status:   Future    Number of Occurrences:   1    Standing Expiration Date:   02/12/2020  . Multiple Myeloma Panel (SPEP&IFE w/QIG)    Standing Status:   Future    Number of Occurrences:   1    Standing Expiration Date:   02/12/2020  . Kappa/lambda light chains    Standing Status:   Future    Number of Occurrences:   1    Standing Expiration Date:   03/18/2020  . IgG 1, 2, 3, and 4    Standing Status:   Future    Number of Occurrences:   1    Standing Expiration Date:   02/12/2020    All of the patients questions were answered with apparent satisfaction. The patient knows to call the clinic  with any problems, questions or concerns.  I spent 45 minutes counseling the patient face to face. The total time spent in the appointment was 60 minutes and  more than 50% was on counseling and direct patient cares.    Sullivan Lone MD MS AAHIVMS [facility] Hematology/Oncology Physician [facility]  (Office):       [phone] (Work cell):  [phone] (Fax):           [phone]  02/12/2019 2:16 PM  I, De Burrs, am acting as a scribe for Dr. Irene Limbo  .I have reviewed the above documentation for accuracy and completeness, and I agree with the above. Brunetta Genera MD

## 2019-02-12 ENCOUNTER — Inpatient Hospital Stay: Payer: BC Managed Care – PPO

## 2019-02-12 ENCOUNTER — Inpatient Hospital Stay: Payer: BC Managed Care – PPO | Attending: Internal Medicine | Admitting: Hematology

## 2019-02-12 ENCOUNTER — Other Ambulatory Visit: Payer: Self-pay

## 2019-02-12 VITALS — BP 156/84 | HR 87 | Temp 98.9°F | Resp 18 | Ht 67.0 in | Wt 230.5 lb

## 2019-02-12 DIAGNOSIS — Z85850 Personal history of malignant neoplasm of thyroid: Secondary | ICD-10-CM | POA: Diagnosis not present

## 2019-02-12 DIAGNOSIS — Z7982 Long term (current) use of aspirin: Secondary | ICD-10-CM | POA: Insufficient documentation

## 2019-02-12 DIAGNOSIS — Z87891 Personal history of nicotine dependence: Secondary | ICD-10-CM | POA: Insufficient documentation

## 2019-02-12 DIAGNOSIS — D649 Anemia, unspecified: Secondary | ICD-10-CM

## 2019-02-12 DIAGNOSIS — K760 Fatty (change of) liver, not elsewhere classified: Secondary | ICD-10-CM | POA: Diagnosis not present

## 2019-02-12 DIAGNOSIS — D801 Nonfamilial hypogammaglobulinemia: Secondary | ICD-10-CM | POA: Diagnosis present

## 2019-02-12 DIAGNOSIS — E119 Type 2 diabetes mellitus without complications: Secondary | ICD-10-CM | POA: Insufficient documentation

## 2019-02-12 DIAGNOSIS — Z79899 Other long term (current) drug therapy: Secondary | ICD-10-CM | POA: Diagnosis not present

## 2019-02-12 DIAGNOSIS — I10 Essential (primary) hypertension: Secondary | ICD-10-CM | POA: Insufficient documentation

## 2019-02-12 DIAGNOSIS — Z794 Long term (current) use of insulin: Secondary | ICD-10-CM | POA: Insufficient documentation

## 2019-02-12 DIAGNOSIS — M797 Fibromyalgia: Secondary | ICD-10-CM | POA: Diagnosis not present

## 2019-02-12 LAB — CBC WITH DIFFERENTIAL/PLATELET
Abs Immature Granulocytes: 0.03 10*3/uL (ref 0.00–0.07)
Basophils Absolute: 0 10*3/uL (ref 0.0–0.1)
Basophils Relative: 1 %
Eosinophils Absolute: 0.1 10*3/uL (ref 0.0–0.5)
Eosinophils Relative: 1 %
HCT: 37.8 % (ref 36.0–46.0)
Hemoglobin: 12.7 g/dL (ref 12.0–15.0)
Immature Granulocytes: 1 %
Lymphocytes Relative: 32 %
Lymphs Abs: 1.8 10*3/uL (ref 0.7–4.0)
MCH: 28.9 pg (ref 26.0–34.0)
MCHC: 33.6 g/dL (ref 30.0–36.0)
MCV: 85.9 fL (ref 80.0–100.0)
Monocytes Absolute: 0.4 10*3/uL (ref 0.1–1.0)
Monocytes Relative: 7 %
Neutro Abs: 3.3 10*3/uL (ref 1.7–7.7)
Neutrophils Relative %: 58 %
Platelets: 297 10*3/uL (ref 150–400)
RBC: 4.4 MIL/uL (ref 3.87–5.11)
RDW: 15.8 % — ABNORMAL HIGH (ref 11.5–15.5)
WBC: 5.7 10*3/uL (ref 4.0–10.5)
nRBC: 0 % (ref 0.0–0.2)

## 2019-02-12 LAB — VITAMIN B12: Vitamin B-12: 205 pg/mL (ref 180–914)

## 2019-02-12 NOTE — Patient Instructions (Signed)
Thank you for choosing [facility] to provide your oncology and hematology care.   Should you have questions after your visit to the [facility] ([facility]), please contact this office at [phone] between 8:30 AM and 4:30 PM. Voicemails left after 4:00 PM may not be returned until the following business day. Calls received after 4:30 PM will be answered by an off-site Nurse Triage Line.    Prescription Refills:  Please have your pharmacy contact us directly for most prescription requests.  Contact the office directly for refills of narcotics (pain medications). Allow 48-72 hours for refills.  Appointments: Please contact the [facility] scheduling department [phone] for questions regarding [facility] appointment scheduling.  Contact the schedulers with any scheduling changes so that your appointment can be rescheduled in a timely manner.   Central Scheduling for [place] [phone] - Call to schedule procedures such as PET scans, CT scans, MRI, Ultrasound, etc.  To afford each patient quality time with our providers, please arrive 30 minutes before your scheduled appointment time.  If you arrive late for your appointment, you may be asked to reschedule.  We strive to give you quality time with our providers, and arriving late affects you and other patients whose appointments are after yours. If you are a no show for multiple scheduled visits, you may be dismissed from the clinic at the providers discretion.     Resources: [facility] Social Workers [phone] for additional information on assistance programs --Anne Cunningham/Abigail Elmore  [place] DSS  [phone]: Information regarding food stamps, Medicaid, and utility assistance [employer] [phone]   [place] [employer]'s shared-ride transportation service for eligible riders who have a disability that prevents them from riding the fixed route bus.   Medicare Rights Center 800-333-4114 Helps people with  Medicare understand their rights and benefits, navigate the Medicare system, and secure the quality healthcare they deserve [employer] 800-227-2345 Assists patients locate various types of support and financial assistance Cancer Care: 1-800-813-HOPE (4673) Provides financial assistance, online support groups, medication/co-pay assistance.      

## 2019-02-13 LAB — FERRITIN: Ferritin: 30 ng/mL (ref 11–307)

## 2019-02-13 LAB — KAPPA/LAMBDA LIGHT CHAINS
Kappa free light chain: 13.7 mg/L (ref 3.3–19.4)
Kappa, lambda light chain ratio: 0.93 (ref 0.26–1.65)
Lambda free light chains: 14.7 mg/L (ref 5.7–26.3)

## 2019-02-13 LAB — IGG 1, 2, 3, AND 4
IgG (Immunoglobin G), Serum: 644 mg/dL (ref 586–1602)
IgG, Subclass 1: 350 mg/dL (ref 248–810)
IgG, Subclass 2: 179 mg/dL (ref 130–555)
IgG, Subclass 3: 92 mg/dL (ref 15–102)
IgG, Subclass 4: 24 mg/dL (ref 2–96)

## 2019-02-13 LAB — IRON AND TIBC
Iron: 151 ug/dL — ABNORMAL HIGH (ref 41–142)
Saturation Ratios: 42 % (ref 21–57)
TIBC: 361 ug/dL (ref 236–444)
UIBC: 210 ug/dL (ref 120–384)

## 2019-02-14 LAB — MULTIPLE MYELOMA PANEL, SERUM
Albumin SerPl Elph-Mcnc: 3.8 g/dL (ref 2.9–4.4)
Albumin/Glob SerPl: 1.5 (ref 0.7–1.7)
Alpha 1: 0.2 g/dL (ref 0.0–0.4)
Alpha2 Glob SerPl Elph-Mcnc: 0.8 g/dL (ref 0.4–1.0)
B-Globulin SerPl Elph-Mcnc: 1.1 g/dL (ref 0.7–1.3)
Gamma Glob SerPl Elph-Mcnc: 0.6 g/dL (ref 0.4–1.8)
Globulin, Total: 2.6 g/dL (ref 2.2–3.9)
IgA: 94 mg/dL (ref 87–352)
IgG (Immunoglobin G), Serum: 630 mg/dL (ref 586–1602)
IgM (Immunoglobulin M), Srm: 58 mg/dL (ref 26–217)
Total Protein ELP: 6.4 g/dL (ref 6.0–8.5)

## 2019-02-24 ENCOUNTER — Other Ambulatory Visit: Payer: Self-pay | Admitting: Cardiology

## 2019-03-04 ENCOUNTER — Ambulatory Visit
Admission: RE | Admit: 2019-03-04 | Discharge: 2019-03-04 | Disposition: A | Discharge status: Home / Self Care | Payer: BC Managed Care – PPO | Source: Ambulatory Visit | Attending: Internal Medicine | Admitting: Internal Medicine

## 2019-03-04 ENCOUNTER — Other Ambulatory Visit: Payer: Self-pay

## 2019-03-04 DIAGNOSIS — Z1231 Encounter for screening mammogram for malignant neoplasm of breast: Secondary | ICD-10-CM

## 2019-03-05 NOTE — Progress Notes (Signed)
Office Visit Note  Patient: Meagan Allen             Date of Birth: [birth year]           MRN: [id number]             PCP: Dianna Rossetti, NP (Inactive) Referring: No ref. provider found Visit Date: 03/06/2019 Occupation: @GUAROCC @  Subjective:  Left hand and wrist pain and swelling   History of Present Illness: NELY DEDMON is a 63 y.o. female with history of inflammatory arthritis and UC.  She discontinued MTX due to elevated LFTs, breast infection, and having a UC flare.  She is having increased pain and swelling in the left wrist and left hand.  She is having left ankle joint pain and swelling.  She went hiking this past weekend and had a flare after walking for a prolonged period of time.  She iced and elevated her left ankle which has helped.  She has been having blood in her stool recently, which she attributes to hemorrhoids not a UC flare.   Activities of Daily Living:  Patient reports morning stiffness for  hours.   Patient Reports nocturnal pain.  Difficulty dressing/grooming: Denies Difficulty climbing stairs: Denies Difficulty getting out of chair: Reports Difficulty using hands for taps, buttons, cutlery, and/or writing: Reports  Review of Systems  Constitutional: Positive for fatigue.  HENT: Positive for mouth dryness. Negative for mouth sores and nose dryness.   Eyes: Positive for dryness. Negative for pain and visual disturbance.  Respiratory: Negative for cough, hemoptysis, shortness of breath and difficulty breathing.   Cardiovascular: Positive for swelling in legs/feet. Negative for chest pain, palpitations and hypertension.  Gastrointestinal: Positive for constipation and diarrhea. Negative for blood in stool.  Endocrine: Negative for increased urination.  Genitourinary: Negative for painful urination.  Musculoskeletal: Positive for arthralgias, joint pain, joint swelling, morning stiffness and muscle tenderness. Negative for myalgias, muscle weakness and  myalgias.  Skin: Negative for color change, pallor, rash, hair loss, nodules/bumps, skin tightness, ulcers and sensitivity to sunlight.  Allergic/Immunologic: Negative for susceptible to infections.  Neurological: Negative for dizziness, numbness and headaches.  Hematological: Positive for bruising/bleeding tendency. Negative for swollen glands.  Psychiatric/Behavioral: Negative for depressed mood and sleep disturbance. The patient is not nervous/anxious.     PMFS History:  Patient Active Problem List   Diagnosis Date Noted  . S/P total knee replacement 06/03/2018  . Left rotator cuff tear 10/17/2017  . Complete rotator cuff tear 10/17/2017  . Tear of left rotator cuff 10/17/2017  . Nonischemic cardiomyopathy ([place]) 08/22/2017  . Ulcerative colitis ([place]) 08/22/2017  . Abnormal stress test 08/22/2017  . Cardiovascular stress test abnormal 08/22/2017  . Cardiomyopathy ([place]) 08/22/2017  . Postsurgical hypothyroidism 08/10/2017  . Esophageal reflux 08/10/2017  . Type 2 diabetes mellitus with diabetic polyneuropathy ([place]) 08/10/2017  . Hepatic steatosis 08/10/2017  . Palpitations 08/10/2017  . Pure hypercholesterolemia 08/10/2017  . Mixed hyperlipidemia 08/10/2017  . Gastroesophageal reflux disease 08/10/2017  . Postoperative hypothyroidism 08/10/2017  . Full thickness rotator cuff tear 08/06/2017  . Resting tremor 03/03/2013  . RUQ pain 03/03/2013  . Hx of thyroid cancer 03/03/2013  . Depression with anxiety 03/03/2013  . History of malignant neoplasm of thyroid 03/03/2013  . Right upper quadrant pain 03/03/2013  . HTN (hypertension) 03/25/2012    Past Medical History:  Diagnosis Date  . Anemia   . Anxiety   . Arthritis    osteo  . Cancer ([place])  thyroid cancer  . CHF (congestive heart failure) ([place])    diagnosed feb 2019  . Depression   . Dyspnea   . Fibromyalgia   . GERD (gastroesophageal reflux disease)   . Hearing loss of right ear   . Heart murmur   . History of  colon polyps   . History of thyroid cancer 2007;  2009   dx papillary thyroid cancer w/ mets to cervical lymph nodes  . Hyperlipidemia   . Hypertension   . IBS (irritable bowel syndrome)   . Left rotator cuff tear   . Migraine   . Nonischemic cardiomyopathy [facility]) cardiologist-  dr Edyth Gunnels   per cardiac cath 08-28-2017  moderate LV dysfunction w/ diffuse hypocontractility ,  ef 35-40%  . 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 PONV (postoperative nausea and vomiting)    last colonoscopy propfol bp dropped and vomiting  . Post-surgical hypothyroidism   . Rosacea   . Type 2 diabetes mellitus ([place])    followed by pcp  . 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[place])     Family History  Problem Relation Age of Onset  . Heart disease Father        MI died age 25  . Hypertension Father   . Diabetes Father   . Arthritis Mother        rheumatoid athritis  . Stroke Mother   . Depression Mother   . Gout Brother   . Cancer Brother        thyroid CA  . Gout Brother   . Gout Brother   . Heart disease Sister        67 months old  . Asthma Son   . Breast cancer Neg Hx    Past Surgical History:  Procedure Laterality Date  . CARDIOVASCULAR STRESS TEST  08-20-2017   dr Agustin Cree   High risk nuclear study w/ large irreverisible defect in the basal and mid inferoseptal, inferior, inferolateral walls and apical septal, inferior walls with small peri infarct ischemia in the apical anterior & lateral walls (consistant w/ prior MI )/  no ST segment deviation noted /  nuclear stress ef 36%/  recommended cardiac cath  . CARPOMETACARPAL ([place]) FUSION OF THUMB Bilateral 2003  . COLONOSCOPY WITH PROPOFOL N/A 05/06/2013   Procedure: COLONOSCOPY WITH PROPOFOL;  Surgeon: Garlan Fair, MD;  Location: [facility];  Service: Endoscopy;  Laterality: N/A;  . D & C HYSTEROSCOPY /  RESECTION POLYP/ ROLLER BALL ABLATION  02-26-2004   dr  Kathyrn Drown  [facility]  . DILATION AND CURETTAGE OF UTERUS  1984  . EXCISION MORTON'S NEUROMA Left 1996  . giant cell tumor  2011   Left ankle 2011  . JOINT REPLACEMENT    . KNEE ARTHROSCOPY  08-16-2010  dr Beola Cord  [facility];  05/ 2012   right x2 left x1  . LEFT HEART CATH AND CORONARY ANGIOGRAPHY N/A 08/28/2017   Procedure: LEFT HEART CATH AND CORONARY ANGIOGRAPHY;  Surgeon: Troy Sine, MD;  Location: [facility];  Service: Cardiovascular;  Laterality: N/A;   large normal coronary arteries in a dominant RCA system;  moderate LV systoic dysfunction w/ diffuse hypocontractility (compatible w/ nonischemic cardiomyopathy), LV end diastoilc pressure normal, LVEF 35-45% by visual estimate  . NASAL LACRIMAL DUCT SURGERY  06/14/2009  . REDO RIGHT MODIFIED RADICAL NECK DISSECTION  08-29-2007    [facility]  . SHOULDER ARTHROSCOPY WITH ROTATOR CUFF  REPAIR AND SUBACROMIAL DECOMPRESSION Left 10/17/2017   Procedure: Left shoulder mini open rotator cuff repair, subacromial decompression;  Surgeon: [name], MD;  Location: [facility];  Service: Orthopedics;  Laterality: Left;  Interscalene Block  . SHOULDER SURGERY     Left mini- open rotator cuff repair Dr. Tonita Cong 10-17-17  . TOTAL KNEE ARTHROPLASTY Right 06/03/2018   Procedure: RIGHT TOTAL KNEE ARTHROPLASTY;  Surgeon: Vickey Huger, MD;  Location: [facility];  Service: Orthopedics;  Laterality: Right;  . TOTAL THYROIDECTOMY  2007  -- [facility]   w/ dissection lymph nodes  . TRANSTHORACIC ECHOCARDIOGRAM  08-20-2017  dr Agustin Cree   ef 25-30%, severe diffuse hypokinesis with no identifiable regional variations, but with profound dyssynchrony, due to arrhythmia insuffient to evaluate LV diastolic dysfunction/  trivial MR/  mild LAE/ Ventricular septum motion abnormal funtion,dyssynergy, & paradox  . WRIST SURGERY Right 2001   Social History   Social History Narrative   Betty was born in [place], [place]. She moved to [place] in 1978 when her family moved to this state. Whitlee  currently lives in [place] with her husband of 81 years. They have 2 adult children and 1 grandson. 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   Immunization History  Administered Date(s) Administered  . Influenza,inj,quad, With Preservative 04/16/2017     Objective: Vital Signs: BP 137/79 (BP Location: Left Arm, Patient Position: Sitting, Cuff Size: Normal)   Pulse 81   Resp 18   Ht 5' 7"  (1.702 m)   Wt 234 lb 12.8 oz (106.5 kg)   BMI 36.77 kg/m    Physical Exam Vitals signs and nursing note reviewed.  Constitutional:      Appearance: She is well-developed.  HENT:     Head: Normocephalic and atraumatic.  Eyes:     Conjunctiva/sclera: Conjunctivae normal.  Neck:     Musculoskeletal: Normal range of motion.  Cardiovascular:     Rate and Rhythm: Normal rate and regular rhythm.     Heart sounds: Normal heart sounds.  Pulmonary:     Effort: Pulmonary effort is normal.     Breath sounds: Normal breath sounds.  Abdominal:     General: Bowel sounds are normal.     Palpations: Abdomen is soft.  Lymphadenopathy:     Cervical: No cervical adenopathy.  Skin:    General: Skin is warm and dry.     Capillary Refill: Capillary refill takes less than 2 seconds.  Neurological:     Mental Status: She is alert and oriented to person, place, and time.  Psychiatric:        Behavior: Behavior normal.      Musculoskeletal Exam: C-spine, thoracic spine, and lumbar spine good ROM.  No midline spinal tenderness.  No SI joint tenderness.  Shoulder joints, elbow joints, MCPs, PIPs, and DIPs good ROM with no synovitis.  Left wrist tenderness and synovitis noted. Right wrist has good ROM with no tenderness or inflammation. Hip joints, ankle joints, MTPs, PIPs, and DIPs good ROM with no synovitis. Right knee replacement is slightly warm but no effusion.  Left knee good ROM with no warmth or effusion. No tenderness of ankle joints.   CDAI Exam: CDAI Score: -  Patient Global: -; Provider Global: - Swollen: 1 ; Tender: 1  Joint Exam      Right  Left  Wrist     Swollen Tender     Investigation: No additional findings.  Imaging: Mm 3d Screen Breast Bilateral  Result Date: 03/04/2019 CLINICAL DATA:  Screening. EXAM: DIGITAL SCREENING BILATERAL MAMMOGRAM WITH TOMO AND CAD COMPARISON:  Previous exam(s). ACR Breast Density Category b: There are scattered areas of fibroglandular density. FINDINGS: There are no findings suspicious for malignancy. Images were processed with CAD. IMPRESSION: No mammographic evidence of malignancy. A result letter of this screening mammogram will be mailed directly to the patient. RECOMMENDATION: Screening mammogram in one year. (Code:SM-B-01Y) BI-RADS CATEGORY  1: Negative. Electronically Signed   By: Dorise Bullion III M.D   On: 03/04/2019 16:58    Recent Labs: Lab Results  Component Value Date   WBC 5.7 02/12/2019   HGB 12.7 02/12/2019   PLT 297 02/12/2019   NA 141 01/15/2019   K 4.3 01/15/2019   CL 105 01/15/2019   CO2 27 01/15/2019   GLUCOSE 97 01/15/2019   BUN 9 01/15/2019   CREATININE 0.81 01/15/2019   BILITOT 0.4 01/15/2019   ALKPHOS 77 05/27/2018   AST 26 01/15/2019   ALT 41 (H) 01/15/2019   PROT 6.7 01/15/2019   ALBUMIN 4.3 05/27/2018   CALCIUM 10.6 (H) 01/15/2019   GFRAA 89 01/15/2019   QFTBGOLDPLUS NEGATIVE 11/29/2018    Speciality Comments: No specialty comments available.  Procedures:  No procedures performed Allergies: Sulfa antibiotics, Bydureon [exenatide], Invokana [canagliflozin], Jardiance [empagliflozin], Lisinopril, Pravastatin, and Propofol     Assessment / Plan:     Visit Diagnoses: Inflammatory arthritis: She has tenderness and synovitis of the left wrist joint on exam.  No tenderness or synovitis of MCP joints.  She has edema in both hands and lower extremities, and she was advised to follow up with PCP or her cardiologist for further evaluation and treatment.  She has no  tenderness or inflammation of the left ankle joint at this time.  She is not taking Methotrexate at this time.  She discontinued MTX due to elevated LFTs and a breast infection.  She does not want to start on any immunosuppressive agents at this time.  She will notify us if she develops increased joint pain or joint swelling.  She will follow up in 6 months.     High risk medication use - D/c MTX in the past due to elevation in LFTs. She is not on any immunosuppressive agents at this time.   Elevated LFTs: ALT was 41 and AST was 26 ([facility]) on 01/15/19.  She was previously taken off of MTX due to elevation in LFTs.   History of ulcerative colitis: She is taking mesalamine as prescribed for management of ulcerative colitis.  She has not had any recent UC flares.  She alternates between diarrhea and constipation. She occasionally has blood in her stool, which she attributes to hemorrhoids.  She will continue to follow up with her GI specialist.    Hyperuricemia: Uric acid was 8.8 on 11/29/18. She has not had any gout flares.   Status post total right knee replacement: Doing well.  She has no discomfort at this time.  She has mild warmth but no effusion.   Primary osteoarthritis of both feet: She has no pain in her feet at this time. She wears proper fitting shoes.  She has good ROM of both ankle joints with no discomfort.  No tenderness or swelling of ankle joints.  Pedal edema noted bilaterally.   Fibromyalgia: She continues to have generalized muscle aches and muscle tenderness due to fibromyalgia. She was encouraged to stay active.   Other medical conditions are listed as follows:   Family history  of rheumatoid arthritis  Hypogammaglobulinemia (HCC)  Hx of rotator cuff tear  History of gastroesophageal reflux (GERD)  Nonischemic cardiomyopathy (HCC)  Hepatic steatosis  Hx of thyroid cancer  History of type 2 diabetes mellitus  Essential hypertension  Orders: No orders of the defined  types were placed in this encounter.  No orders of the defined types were placed in this encounter.    Follow-Up Instructions: Return in about 6 months (around 09/06/2019) for Inflammatory arthritis and UC .   Ofilia Neas, PA-C   I examined and evaluated the patient with Hazel Sams PA.  Patient was concerned about the swelling on her left hand.  The discomfort in her wrist and mild swelling has not changed.  On examination today she had no joint tenderness and no joint swelling.  She had soft tissue swelling on her left hand.  She states she has difficulty making a fist because her hand feels tight.  At this point she will follow-up with her PCP and her cardiologist.  I have advised her to raise her arm to decrease swelling.  She had no swelling over her ankle and no tenderness on examination.  The plan of care was discussed as noted above.  Bo Merino, MD  Note - This record has been created using Editor, commissioning.  Chart creation errors have been sought, but may not always  have been located. Such creation errors do not reflect on  the standard of medical care.

## 2019-03-06 ENCOUNTER — Other Ambulatory Visit: Payer: Self-pay

## 2019-03-06 ENCOUNTER — Encounter: Payer: Self-pay | Admitting: Rheumatology

## 2019-03-06 ENCOUNTER — Ambulatory Visit: Payer: BC Managed Care – PPO | Admitting: Rheumatology

## 2019-03-06 VITALS — BP 137/79 | HR 81 | Resp 18 | Ht 67.0 in | Wt 234.8 lb

## 2019-03-06 DIAGNOSIS — Z79899 Other long term (current) drug therapy: Secondary | ICD-10-CM

## 2019-03-06 DIAGNOSIS — R7989 Other specified abnormal findings of blood chemistry: Secondary | ICD-10-CM

## 2019-03-06 DIAGNOSIS — Z8639 Personal history of other endocrine, nutritional and metabolic disease: Secondary | ICD-10-CM

## 2019-03-06 DIAGNOSIS — Z8719 Personal history of other diseases of the digestive system: Secondary | ICD-10-CM | POA: Diagnosis not present

## 2019-03-06 DIAGNOSIS — Z96651 Presence of right artificial knee joint: Secondary | ICD-10-CM

## 2019-03-06 DIAGNOSIS — M797 Fibromyalgia: Secondary | ICD-10-CM

## 2019-03-06 DIAGNOSIS — I10 Essential (primary) hypertension: Secondary | ICD-10-CM

## 2019-03-06 DIAGNOSIS — K760 Fatty (change of) liver, not elsewhere classified: Secondary | ICD-10-CM

## 2019-03-06 DIAGNOSIS — Z8739 Personal history of other diseases of the musculoskeletal system and connective tissue: Secondary | ICD-10-CM

## 2019-03-06 DIAGNOSIS — M19071 Primary osteoarthritis, right ankle and foot: Secondary | ICD-10-CM

## 2019-03-06 DIAGNOSIS — M1990 Unspecified osteoarthritis, unspecified site: Secondary | ICD-10-CM

## 2019-03-06 DIAGNOSIS — R945 Abnormal results of liver function studies: Secondary | ICD-10-CM | POA: Diagnosis not present

## 2019-03-06 DIAGNOSIS — E790 Hyperuricemia without signs of inflammatory arthritis and tophaceous disease: Secondary | ICD-10-CM

## 2019-03-06 DIAGNOSIS — M19072 Primary osteoarthritis, left ankle and foot: Secondary | ICD-10-CM

## 2019-03-06 DIAGNOSIS — I428 Other cardiomyopathies: Secondary | ICD-10-CM

## 2019-03-06 DIAGNOSIS — Z8261 Family history of arthritis: Secondary | ICD-10-CM

## 2019-03-06 DIAGNOSIS — Z85850 Personal history of malignant neoplasm of thyroid: Secondary | ICD-10-CM

## 2019-03-06 DIAGNOSIS — D801 Nonfamilial hypogammaglobulinemia: Secondary | ICD-10-CM

## 2019-03-11 ENCOUNTER — Telehealth: Payer: BC Managed Care – PPO | Admitting: Cardiology

## 2019-03-12 ENCOUNTER — Ambulatory Visit: Payer: BC Managed Care – PPO | Admitting: Cardiology

## 2019-03-14 ENCOUNTER — Other Ambulatory Visit: Payer: Self-pay

## 2019-03-14 ENCOUNTER — Encounter: Payer: Self-pay | Admitting: Cardiology

## 2019-03-14 ENCOUNTER — Ambulatory Visit (INDEPENDENT_AMBULATORY_CARE_PROVIDER_SITE_OTHER): Payer: BC Managed Care – PPO | Admitting: Cardiology

## 2019-03-14 ENCOUNTER — Telehealth: Payer: Self-pay | Admitting: Rheumatology

## 2019-03-14 VITALS — BP 140/80 | HR 96 | Ht 67.0 in | Wt 229.4 lb

## 2019-03-14 DIAGNOSIS — K51019 Ulcerative (chronic) pancolitis with unspecified complications: Secondary | ICD-10-CM

## 2019-03-14 DIAGNOSIS — M797 Fibromyalgia: Secondary | ICD-10-CM

## 2019-03-14 DIAGNOSIS — M1990 Unspecified osteoarthritis, unspecified site: Secondary | ICD-10-CM

## 2019-03-14 DIAGNOSIS — I428 Other cardiomyopathies: Secondary | ICD-10-CM | POA: Diagnosis not present

## 2019-03-14 DIAGNOSIS — I10 Essential (primary) hypertension: Secondary | ICD-10-CM

## 2019-03-14 MED ORDER — FUROSEMIDE 20 MG PO TABS: 20.0000 mg | ORAL_TABLET | Freq: Every day | ORAL | 1 refills | Status: DC

## 2019-03-14 NOTE — Patient Instructions (Signed)
Medication Instructions:  Your physician has recommended you make the following change in your medication:   START: Lasix 20 mg daily   If you need a refill on your cardiac medications before your next appointment, please call your pharmacy.   Lab work: None.  If you have labs (blood work) drawn today and your tests are completely normal, you will receive your results only by: Marland Kitchen MyChart Message (if you have MyChart) OR . A paper copy in the mail If you have any lab test that is abnormal or we need to change your treatment, we will call you to review the results.  Testing/Procedures: Your physician has requested that you have an echocardiogram. Echocardiography is a painless test that uses sound waves to create images of your heart. It provides your doctor with information about the size and shape of your heart and how well your heart's chambers and valves are working. This procedure takes approximately one hour. There are no restrictions for this procedure.    Follow-Up: At [facility], you and your health needs are our priority.  As part of our continuing mission to provide you with exceptional heart care, we have created designated Provider Care Teams.  These Care Teams include your primary Cardiologist (physician) and Advanced Practice Providers (APPs -  Physician Assistants and Nurse Practitioners) who all work together to provide you with the care you need, when you need it. You will need a follow up appointment in 3 months.  Please call our office 2 months in advance to schedule this appointment.  You may see No primary care provider on file. or another member of our [employer] Provider Team in [place]: Shirlee More, MD . Jyl Heinz, MD  Any Other Special Instructions Will Be Listed Below (If Applicable).  Furosemide tablets What is this medicine? FUROSEMIDE (fyoor OH se mide) is a diuretic. It helps you make more urine and to lose salt and excess water from your  body. This medicine is used to treat high blood pressure, and edema or swelling from heart, kidney, or liver disease. This medicine may be used for other purposes; ask your health care provider or pharmacist if you have questions. COMMON BRAND NAME(S): Active-Medicated Specimen Kit, Delone, Diuscreen, Lasix, RX Specimen Collection Kit, Specimen Collection Kit, URINX Medicated Specimen Collection What should I tell my health care provider before I take this medicine? They need to know if you have any of these conditions:  abnormal blood electrolytes  diarrhea or vomiting  gout  heart disease  kidney disease, small amounts of urine, or difficulty passing urine  liver disease  thyroid disease  an unusual or allergic reaction to furosemide, sulfa drugs, other medicines, foods, dyes, or preservatives  pregnant or trying to get pregnant  breast-feeding How should I use this medicine? Take this medicine by mouth with a glass of water. Follow the directions on the prescription label. You may take this medicine with or without food. If it upsets your stomach, take it with food or milk. Do not take your medicine more often than directed. Remember that you will need to pass more urine after taking this medicine. Do not take your medicine at a time of day that will cause you problems. Do not take at bedtime. Talk to your pediatrician regarding the use of this medicine in children. While this drug may be prescribed for selected conditions, precautions do apply. Overdosage: If you think you have taken too much of this medicine contact a poison control  center or emergency room at once. NOTE: This medicine is only for you. Do not share this medicine with others. What if I miss a dose? If you miss a dose, take it as soon as you can. If it is almost time for your next dose, take only that dose. Do not take double or extra doses. What may interact with this medicine?  aspirin and aspirin-like  medicines  certain antibiotics  chloral hydrate  cisplatin  cyclosporine  digoxin  diuretics  laxatives  lithium  medicines for blood pressure  medicines that relax muscles for surgery  methotrexate  NSAIDs, medicines for pain and inflammation like ibuprofen, naproxen, or indomethacin  phenytoin  steroid medicines like prednisone or cortisone  sucralfate  thyroid hormones This list may not describe all possible interactions. Give your health care provider a list of all the medicines, herbs, non-prescription drugs, or dietary supplements you use. Also tell them if you smoke, drink alcohol, or use illegal drugs. Some items may interact with your medicine. What should I watch for while using this medicine? Visit your doctor or health care provider for regular checks on your progress. Check your blood pressure regularly. Ask your doctor or health care provider what your blood pressure should be, and when you should contact him or her. If you are a diabetic, check your blood sugar as directed. This medicine may cause serious skin reactions. They can happen weeks to months after starting the medicine. Contact your health care provider right away if you notice fevers or flu-like symptoms with a rash. The rash may be red or purple and then turn into blisters or peeling of the skin. Or, you might notice a red rash with swelling of the face, lips or lymph nodes in your neck or under your arms. You may need to be on a special diet while taking this medicine. Check with your doctor. Also, ask how many glasses of fluid you need to drink a day. You must not get dehydrated. You may get drowsy or dizzy. Do not drive, use machinery, or do anything that needs mental alertness until you know how this drug affects you. Do not stand or sit up quickly, especially if you are an older patient. This reduces the risk of dizzy or fainting spells. Alcohol can make you more drowsy and dizzy. Avoid alcoholic  drinks. This medicine can make you more sensitive to the sun. Keep out of the sun. If you cannot avoid being in the sun, wear protective clothing and use sunscreen. Do not use sun lamps or tanning beds/booths. What side effects may I notice from receiving this medicine? Side effects that you should report to your doctor or health care professional as soon as possible:  blood in urine or stools  dry mouth  fever or chills  hearing loss or ringing in the ears  irregular heartbeat  muscle pain or weakness, cramps  rash, fever, and swollen lymph nodes  redness, blistering, peeling or loosening of the skin, including inside the mouth  skin rash  stomach upset, pain, or nausea  tingling or numbness in the hands or feet  unusually weak or tired  vomiting or diarrhea  yellowing of the eyes or skin Side effects that usually do not require medical attention (report to your doctor or health care professional if they continue or are bothersome):  headache  loss of appetite  unusual bleeding or bruising This list may not describe all possible side effects. Call your doctor for medical advice  about side effects. You may report side effects to FDA at 1-800-FDA-1088. Where should I keep my medicine? Keep out of the reach of children. Store at room temperature between 15 and 30 degrees C (59 and 86 degrees F). Protect from light. Throw away any unused medicine after the expiration date. NOTE: This sheet is a summary. It may not cover all possible information. If you have questions about this medicine, talk to your doctor, pharmacist, or health care provider.  2020 Elsevier/Gold Standard (2018-10-04 14:04:13)  Echocardiogram An echocardiogram is a procedure that uses painless sound waves (ultrasound) to produce an image of the heart. Images from an echocardiogram can provide important information about:  Signs of coronary artery disease (CAD).  Aneurysm detection. An aneurysm is a  weak or damaged part of an artery wall that bulges out from the normal force of blood pumping through the body.  Heart size and shape. Changes in the size or shape of the heart can be associated with certain conditions, including heart failure, aneurysm, and CAD.  Heart muscle function.  Heart valve function.  Signs of a past heart attack.  Fluid buildup around the heart.  Thickening of the heart muscle.  A tumor or infectious growth around the heart valves. Tell a health care provider about:  Any allergies you have.  All medicines you are taking, including vitamins, herbs, eye drops, creams, and over-the-counter medicines.  Any blood disorders you have.  Any surgeries you have had.  Any medical conditions you have.  Whether you are pregnant or may be pregnant. What are the risks? Generally, this is a safe procedure. However, problems may occur, including:  Allergic reaction to dye (contrast) that may be used during the procedure. What happens before the procedure? No specific preparation is needed. You may eat and drink normally. What happens during the procedure?   An IV tube may be inserted into one of your veins.  You may receive contrast through this tube. A contrast is an injection that improves the quality of the pictures from your heart.  A gel will be applied to your chest.  A wand-like tool (transducer) will be moved over your chest. The gel will help to transmit the sound waves from the transducer.  The sound waves will harmlessly bounce off of your heart to allow the heart images to be captured in real-time motion. The images will be recorded on a computer. The procedure may vary among health care providers and hospitals. What happens after the procedure?  You may return to your normal, everyday life, including diet, activities, and medicines, unless your health care provider tells you not to do that. Summary  An echocardiogram is a procedure that uses  painless sound waves (ultrasound) to produce an image of the heart.  Images from an echocardiogram can provide important information about the size and shape of your heart, heart muscle function, heart valve function, and fluid buildup around your heart.  You do not need to do anything to prepare before this procedure. You may eat and drink normally.  After the echocardiogram is completed, you may return to your normal, everyday life, unless your health care provider tells you not to do that. This information is not intended to replace advice given to you by your health care provider. Make sure you discuss any questions you have with your health care provider. Document Released: 06/30/2000 Document Revised: 10/24/2018 Document Reviewed: 08/05/2016 Elsevier Patient Education  2020 [employer].

## 2019-03-14 NOTE — Telephone Encounter (Signed)
Referral placed.

## 2019-03-14 NOTE — Telephone Encounter (Signed)
Patient called requesting a referral be sent to [facility].  Patient states "they require a physician referral and since Dr. Estanislado Pandy is not able to help me" is requesting she fax the referral to [phone].

## 2019-03-14 NOTE — Addendum Note (Signed)
Addended by: Ashok Norris on: 03/14/2019 10:36 AM   Modules accepted: Orders

## 2019-03-14 NOTE — Progress Notes (Signed)
Cardiology Office Note:    Date:  03/14/2019   ID:  Meagan Allen, DOB 04/16/1956, MRN [id number]  PCP:  Dianna Rossetti, NP (Inactive)  Cardiologist:  Jenne Campus, MD    Referring MD: No ref. provider found   Chief Complaint  Patient presents with  . Follow-up    History of Present Illness:    Meagan Allen is a 63 y.o. female with history of cardiomyopathy, normalization based on latest echocardiogram done last year.  Hypertension, diabetes, arthritis.  She comes today to my office for follow-up overall she is not doing well chief complaint is pain in multiple joints.  She did see new rheumatologist she was put back on methotrexate however developed liver function test abnormality as well as flareup of her ulcerative colitis and this medication has been withdrawn.  She was told by rheumatologist there is nothing can be done for her.  Obviously she is very disappointed with that.  She also has some hematological test done.  She does have follow-up with that physician she is also unhappy about the fact that her primary care physician has been changed.  Cardiac wise seems to be doing well complain of having swelling of her hands and actually she was told by rheumatologist that the swelling is most likely related to her heart and I respectfully disagree with this.  Her blood pressure still slightly elevated.  There is some room for improvement.  She is already taking ARB as well as beta-blocker which I will continue.  We added Norvasc last time.  However because of swelling she would like me to add some diuretic which probably is a good idea anyway.  Past Medical History:  Diagnosis Date  . Anemia   . Anxiety   . Arthritis    osteo  . Cancer [facility])    thyroid cancer  . CHF (congestive heart failure) ([place])    diagnosed feb 2019  . Depression   . Dyspnea   . Fibromyalgia   . GERD (gastroesophageal reflux disease)   . Hearing loss of right ear   . Heart murmur   . History of colon  polyps   . History of thyroid cancer 2007;  2009   dx papillary thyroid cancer w/ mets to cervical lymph nodes  . Hyperlipidemia   . Hypertension   . IBS (irritable bowel syndrome)   . Left rotator cuff tear   . Migraine   . Nonischemic cardiomyopathy [facility]) cardiologist-  dr Edyth Gunnels   per cardiac cath 08-28-2017  moderate LV dysfunction w/ diffuse hypocontractility ,  ef 35-40%  . 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 PONV (postoperative nausea and vomiting)    last colonoscopy propfol bp dropped and vomiting  . Post-surgical hypothyroidism   . Rosacea   . Type 2 diabetes mellitus ([place])    followed by pcp  . 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[place])     Past Surgical History:  Procedure Laterality Date  . CARDIOVASCULAR STRESS TEST  08-20-2017   dr Agustin Cree   High risk nuclear study w/ large irreverisible defect in the basal and mid inferoseptal, inferior, inferolateral walls and apical septal, inferior walls with small peri infarct ischemia in the apical anterior & lateral walls (consistant w/ prior MI )/  no ST segment deviation noted /  nuclear stress ef 36%/  recommended cardiac cath  . CARPOMETACARPAL ([place]) FUSION OF THUMB Bilateral 2003  . COLONOSCOPY  WITH PROPOFOL N/A 05/06/2013   Procedure: COLONOSCOPY WITH PROPOFOL;  Surgeon: Garlan Fair, MD;  Location: [facility] ENDOSCOPY;  Service: Endoscopy;  Laterality: N/A;  . D & C HYSTEROSCOPY /  RESECTION POLYP/ ROLLER BALL ABLATION  02-26-2004   dr Kathyrn Drown  [facility]  . DILATION AND CURETTAGE OF UTERUS  1984  . EXCISION MORTON'S NEUROMA Left 1996  . giant cell tumor  2011   Left ankle 2011  . JOINT REPLACEMENT    . KNEE ARTHROSCOPY  08-16-2010  dr Beola Cord  [facility];  05/ 2012   right x2 left x1  . LEFT HEART CATH AND CORONARY ANGIOGRAPHY N/A 08/28/2017   Procedure: LEFT HEART CATH AND CORONARY ANGIOGRAPHY;  Surgeon: Troy Sine, MD;  Location: [facility] CV  LAB;  Service: Cardiovascular;  Laterality: N/A;   large normal coronary arteries in a dominant RCA system;  moderate LV systoic dysfunction w/ diffuse hypocontractility (compatible w/ nonischemic cardiomyopathy), LV end diastoilc pressure normal, LVEF 35-45% by visual estimate  . NASAL LACRIMAL DUCT SURGERY  06/14/2009  . REDO RIGHT MODIFIED RADICAL NECK DISSECTION  08-29-2007    [facility]  . SHOULDER ARTHROSCOPY WITH ROTATOR CUFF REPAIR AND SUBACROMIAL DECOMPRESSION Left 10/17/2017   Procedure: Left shoulder mini open rotator cuff repair, subacromial decompression;  Surgeon: [name], MD;  Location: [facility] ORS;  Service: Orthopedics;  Laterality: Left;  Interscalene Block  . SHOULDER SURGERY     Left mini- open rotator cuff repair Dr. Tonita Cong 10-17-17  . TOTAL KNEE ARTHROPLASTY Right 06/03/2018   Procedure: RIGHT TOTAL KNEE ARTHROPLASTY;  Surgeon: Vickey Huger, MD;  Location: [facility] ORS;  Service: Orthopedics;  Laterality: Right;  . TOTAL THYROIDECTOMY  2007  -- [facility]   w/ dissection lymph nodes  . TRANSTHORACIC ECHOCARDIOGRAM  08-20-2017  dr Agustin Cree   ef 25-30%, severe diffuse hypokinesis with no identifiable regional variations, but with profound dyssynchrony, due to arrhythmia insuffient to evaluate LV diastolic dysfunction/  trivial MR/  mild LAE/ Ventricular septum motion abnormal funtion,dyssynergy, & paradox  . WRIST SURGERY Right 2001    Current Medications: Current Meds  Medication Sig  . amLODipine (NORVASC) 2.5 MG tablet Take 1 tablet (2.5 mg total) by mouth daily.  Marland Kitchen aspirin EC 81 MG tablet Take 81 mg by mouth daily.  Marland Kitchen buPROPion (WELLBUTRIN XL) 150 MG 24 hr tablet Take 450 mg by mouth every morning.   . cetirizine (ZYRTEC) 10 MG tablet Take 10 mg by mouth daily.  . clonazePAM (KLONOPIN) 0.5 MG tablet Take 0.5 mg by mouth at bedtime.   . CONTOUR NEXT TEST test strip USE TO CHECK BLOOD SUGAR 4 TIMES DAILY  . ferrous sulfate 325 (65 FE) MG tablet Take 325 mg by mouth daily as needed.   .  Insulin Glargine (LANTUS SOLOSTAR) 100 UNIT/ML Solostar Pen Inject 80 Units into the skin daily at 10 pm.   . levothyroxine (SYNTHROID, LEVOTHROID) 137 MCG tablet Take 137 mcg by mouth at bedtime.   Marland Kitchen linaclotide (LINZESS) 72 MCG capsule Take 72 mcg by mouth every other day.   . liraglutide (VICTOZA) 18 MG/3ML SOPN Inject 1.2 mg into the skin at bedtime.   . mesalamine (LIALDA) 1.2 g EC tablet Take 2.4 g by mouth 2 (two) times daily.  . metFORMIN (GLUCOPHAGE-XR) 500 MG 24 hr tablet Take 1,000 mg by mouth every evening.  . metoprolol succinate (TOPROL-XL) 100 MG 24 hr tablet TAKE 1 TABLET BY MOUTH DAILY WITH OR IMMEDIATELY FOLLOWING A MEAL.  Marland Kitchen Misc Natural Products (COSAMIN  ASU ADVANCED FORMULA) CAPS Take 2 capsules by mouth daily.   . Multiple Vitamins-Minerals (HAIR SKIN & NAILS ADVANCED PO) Take 1 tablet by mouth daily.  . pantoprazole (PROTONIX) 40 MG tablet Take 40 mg by mouth daily.  . polyethylene glycol powder (GLYCOLAX/MIRALAX) powder Take 17 g by mouth daily as needed (for constipation.).   Marland Kitchen valsartan (DIOVAN) 80 MG tablet      Allergies:   Sulfa antibiotics, Bydureon [exenatide], Invokana [canagliflozin], Jardiance [empagliflozin], Lisinopril, Pravastatin, and Propofol   Social History   Socioeconomic History  . Marital status: Married    Spouse name: Jeneen Rinks  . Number of children: 2  . Years of education: 90  . Highest education level: Not on file  Occupational History  . Occupation: Banker for [employer]: [employer]  Social Needs  . Financial resource strain: Not on file  . Food insecurity    Worry: Not on file    Inability: Not on file  . Transportation needs    Medical: Not on file    Non-medical: Not on file  Tobacco Use  . Smoking status: Former Smoker    Packs/day: 3.00    Years: 10.00    Pack years: 30.00    Types: Cigarettes    Quit date: 07/17/1980    Years since quitting: 38.6  . Smokeless tobacco: Never Used  Substance  and Sexual Activity  . Alcohol use: No  . Drug use: No  . Sexual activity: Yes    Birth control/protection: Post-menopausal  Lifestyle  . Physical activity    Days per week: Not on file    Minutes per session: Not on file  . Stress: Not on file  Relationships  . Social Herbalist on phone: Not on file    Gets together: Not on file    Attends religious service: Not on file    Active member of club or organization: Not on file    Attends meetings of clubs or organizations: Not on file    Relationship status: Not on file  Other Topics Concern  . Not on file  Social History Narrative   Tuesday was born in [place], [place]. She moved to [place] in 1978 when her family moved to this state. Cecilee currently lives in [place] with her husband of 54 years. They have 2 adult children and 1 grandson. 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     Family History: The patient's family history includes Arthritis in her mother; Asthma in her son; Cancer in her brother; Depression in her mother; Diabetes in her father; Gout in her brother, brother, and brother; Heart disease in her father and sister; Hypertension in her father; Stroke in her mother. There is no history of Breast cancer. ROS:   Please see the history of present illness.    All 14 point review of systems negative except as described per history of present illness  EKGs/Labs/Other Studies Reviewed:      Recent Labs: 01/15/2019: ALT 41; BUN 9; Creat 0.81; Potassium 4.3; Sodium 141 02/12/2019: Hemoglobin 12.7; Platelets 297  Recent Lipid Panel    Component Value Date/Time   CHOL 192 03/27/2018 0937   TRIG 183 (H) 03/27/2018 0937   HDL 41 03/27/2018 0937   CHOLHDL 4.7 (H) 03/27/2018 0937   LDLCALC 114 (H) 03/27/2018 0937    Physical Exam:    VS:  BP 140/80   Pulse 96  Ht 5' 7"  (1.702 m)   Wt 229 lb 6.4 oz (104.1 kg)   SpO2 98%   BMI 35.93 kg/m     Wt Readings from Last 3  Encounters:  03/14/19 229 lb 6.4 oz (104.1 kg)  03/06/19 234 lb 12.8 oz (106.5 kg)  02/12/19 230 lb 8 oz (104.6 kg)     GEN:  Well nourished, well developed in no acute distress HEENT: Normal NECK: No JVD; No carotid bruits LYMPHATICS: No lymphadenopathy CARDIAC: RRR, no murmurs, no rubs, no gallops RESPIRATORY:  Clear to auscultation without rales, wheezing or rhonchi  ABDOMEN: Soft, non-tender, non-distended MUSCULOSKELETAL:  No edema; No deformity  SKIN: Warm and dry LOWER EXTREMITIES: no swelling NEUROLOGIC:  Alert and oriented x 3 PSYCHIATRIC:  Normal affect   ASSESSMENT:    1. Nonischemic cardiomyopathy ([place])   2. Essential hypertension   3. Ulcerative pancolitis with complication ([place])    PLAN:    In order of problems listed above:  1. Nonischemic cardiomyopathy after recheck and will do echocardiogram. 2. Essential hypertension last furosemide 1 mg to her medical regimen. 3. Ulcerative colitis.  Doing well now from that point of view. 4. Arthritis she did see a rheumatologist however she is very unhappy.  She wants to get second opinion I told her probably best place to go is [facility] and be seen there.   Medication Adjustments/Labs and Tests Ordered: Current medicines are reviewed at length with the patient today.  Concerns regarding medicines are outlined above.  No orders of the defined types were placed in this encounter.  Medication changes: No orders of the defined types were placed in this encounter.   Signed, Park Liter, MD, [facility] 03/14/2019 10:29 AM    [place]

## 2019-03-19 ENCOUNTER — Ambulatory Visit (HOSPITAL_BASED_OUTPATIENT_CLINIC_OR_DEPARTMENT_OTHER)
Admission: RE | Admit: 2019-03-19 | Discharge: 2019-03-19 | Disposition: A | Discharge status: Home / Self Care | Payer: BC Managed Care – PPO | Source: Ambulatory Visit | Attending: Cardiology | Admitting: Cardiology

## 2019-03-19 DIAGNOSIS — I428 Other cardiomyopathies: Secondary | ICD-10-CM | POA: Insufficient documentation

## 2019-03-19 NOTE — Progress Notes (Signed)
  Echocardiogram 2D Echocardiogram has been performed.  Meagan Allen 03/19/2019, 1:50 PM

## 2019-03-21 ENCOUNTER — Telehealth: Payer: Self-pay | Admitting: Emergency Medicine

## 2019-03-21 DIAGNOSIS — I10 Essential (primary) hypertension: Secondary | ICD-10-CM

## 2019-03-21 MED ORDER — ENTRESTO 24-26 MG PO TABS: 1.0000 | ORAL_TABLET | Freq: Two times a day (BID) | ORAL | 1 refills | Status: DC

## 2019-03-21 NOTE — Telephone Encounter (Signed)
Patient informed of results and advised to stop valsartan and start entresto 24/26 mg twice daily. Patient advised to have labs drawn in 1 week. No further questions.

## 2019-03-28 ENCOUNTER — Telehealth: Payer: Self-pay | Admitting: Cardiology

## 2019-03-28 ENCOUNTER — Telehealth: Payer: Self-pay | Admitting: Rheumatology

## 2019-03-28 NOTE — Telephone Encounter (Signed)
Patient called stated [facility] told her they did not received records that we faxed 9/3. She would like to just pick up records and hand deliver. I have these ready at front for her to pickup

## 2019-03-28 NOTE — Telephone Encounter (Signed)
Patient informed of Dr. Ollen Bowl recommendation. Patient verbally understands. No further questions.

## 2019-03-28 NOTE — Telephone Encounter (Signed)
Called patient. She was wanting clarification on her echocardiogram results. She was worried about "left bundle branch block". I explained the meaning behind this as it is a electrical block. She also wants to understand if it os okay for her to be starting back entresto 24/26 bid even though she used to be on 49/51bid. I let her know I will confirm with Dr.Krasowksi.

## 2019-03-28 NOTE — Telephone Encounter (Signed)
Patient wants to discuss test results with Dr Raliegh Ip

## 2019-03-28 NOTE — Telephone Encounter (Signed)
Lets stsrat with Entresto 24/26 bid if everything is ok, later will increase

## 2019-04-01 LAB — BASIC METABOLIC PANEL
BUN/Creatinine Ratio: 12 (ref 12–28)
BUN: 11 mg/dL (ref 8–27)
CO2: 24 mmol/L (ref 20–29)
Calcium: 10 mg/dL (ref 8.7–10.3)
Chloride: 104 mmol/L (ref 96–106)
Creatinine, Ser: 0.89 mg/dL (ref 0.57–1.00)
GFR calc Af Amer: 79 mL/min/{1.73_m2} (ref >59)
GFR calc non Af Amer: 69 mL/min/{1.73_m2} (ref >59)
Glucose: 189 mg/dL — ABNORMAL HIGH (ref 65–99)
Potassium: 4.5 mmol/L (ref 3.5–5.2)
Sodium: 143 mmol/L (ref 134–144)

## 2019-04-17 ENCOUNTER — Ambulatory Visit: Payer: BC Managed Care – PPO | Admitting: Rheumatology

## 2019-04-23 ENCOUNTER — Other Ambulatory Visit: Payer: Self-pay | Admitting: Internal Medicine

## 2019-04-23 DIAGNOSIS — Z780 Asymptomatic menopausal state: Secondary | ICD-10-CM

## 2019-04-25 ENCOUNTER — Other Ambulatory Visit: Payer: Self-pay

## 2019-04-25 ENCOUNTER — Ambulatory Visit
Admission: RE | Admit: 2019-04-25 | Discharge: 2019-04-25 | Disposition: A | Discharge status: Home / Self Care | Payer: BC Managed Care – PPO | Source: Ambulatory Visit | Attending: Internal Medicine | Admitting: Internal Medicine

## 2019-04-25 DIAGNOSIS — Z780 Asymptomatic menopausal state: Secondary | ICD-10-CM

## 2019-04-29 ENCOUNTER — Other Ambulatory Visit: Payer: Self-pay | Admitting: Cardiology

## 2019-04-29 NOTE — Telephone Encounter (Signed)
Entresto refill sent

## 2019-05-03 IMAGING — CT CT ABD-PELV W/ CM
2 of 5 series · 14 of 46 positions shown, 16 images · IV contrast (iopamidol)
Comparison: None.

CLINICAL DATA: Ulcerative colitis with rectal bleeding and
proctitis

EXAM:
CT ABDOMEN AND PELVIS WITH CONTRAST
TECHNIQUE: Multidetector CT imaging of the abdomen and pelvis was performed
using the standard protocol following bolus administration of
intravenous contrast.
CONTRAST:  125mL [id number]-KPP IOPAMIDOL (UU2SXS-KPP) INJECTION 61%

[Series 2: abd pelvis 5.00 br40 s3 ax · axial · 0.78mm/px · z∈[+1070,+1485]mm · 11 of 93 slices shown, 13 images]
[im 5/93  soft-tissue]
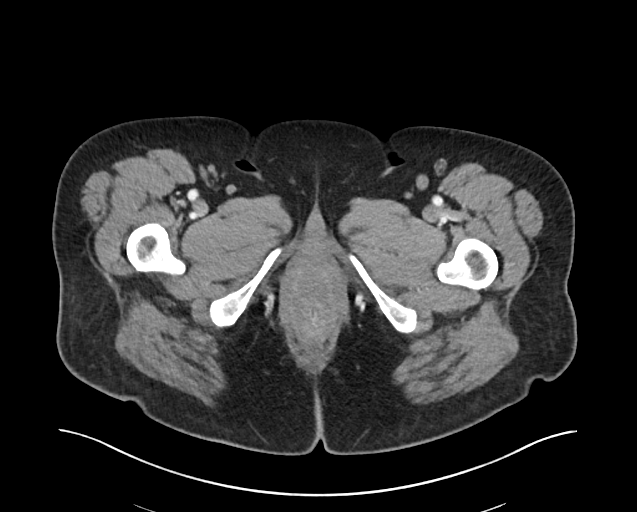
[im 5/93  bone]
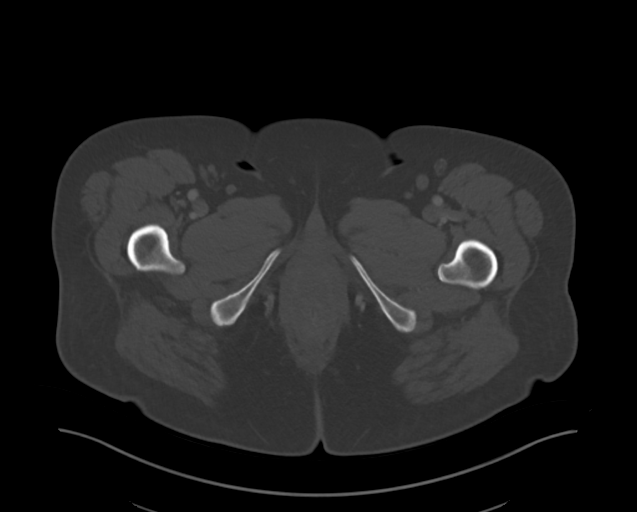
[im 15/93  soft-tissue]
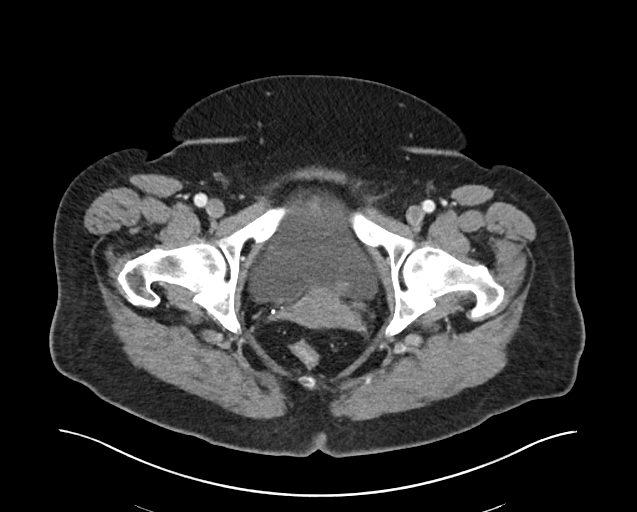
[im 25/93  soft-tissue]
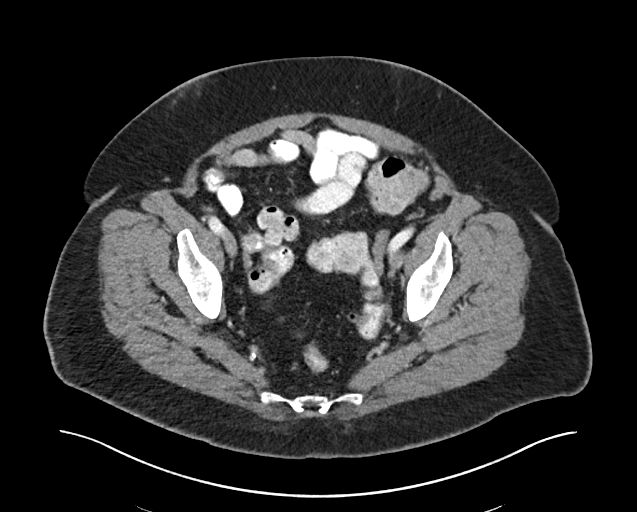
[im 30/93  soft-tissue]
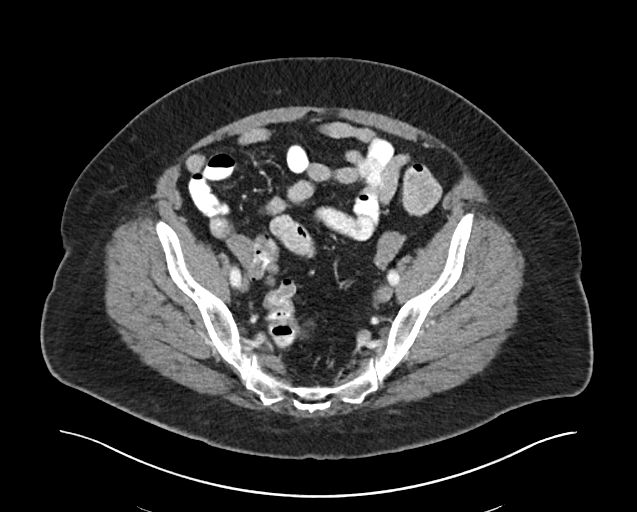
[im 39/93  soft-tissue]
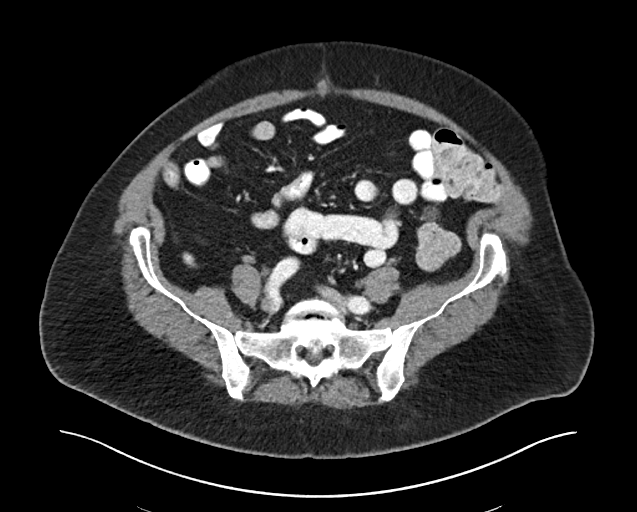
[im 49/93  soft-tissue]
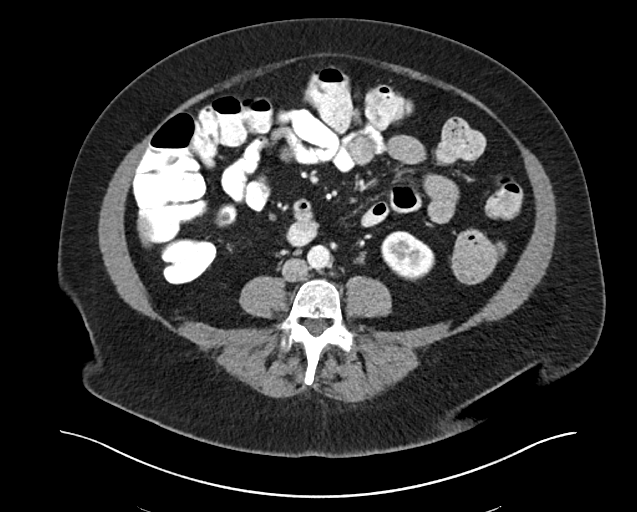
[im 54/93  soft-tissue]
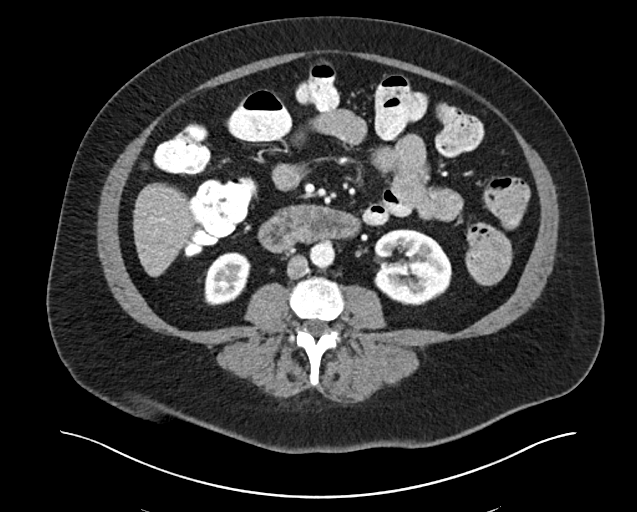
[im 63/93  soft-tissue]
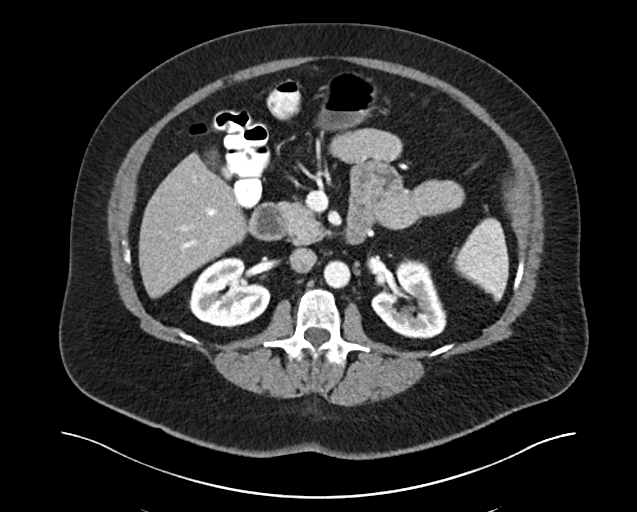
[im 68/93  soft-tissue]
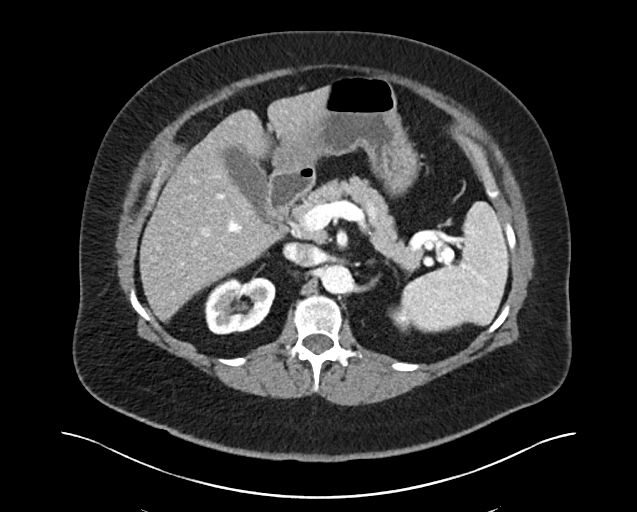
[im 68/93  bone]
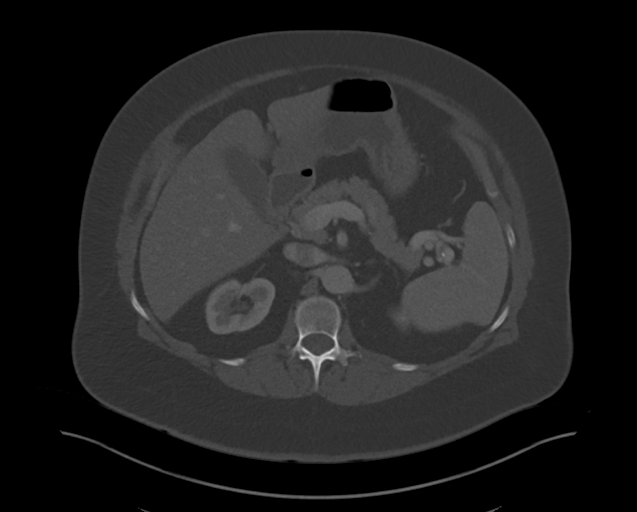
[im 78/93  soft-tissue]
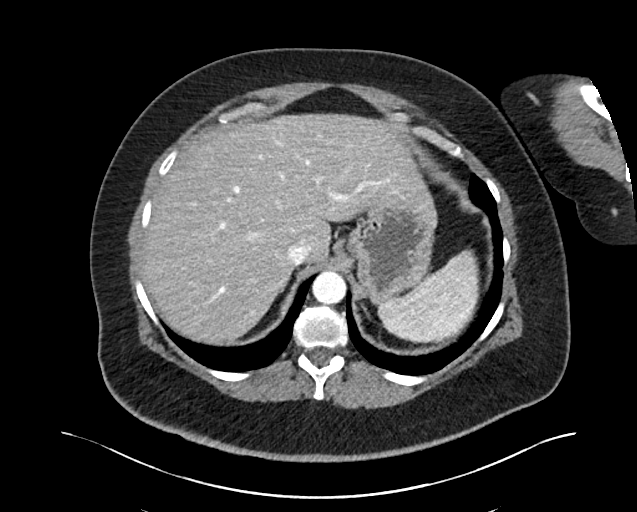
[im 88/93  soft-tissue]
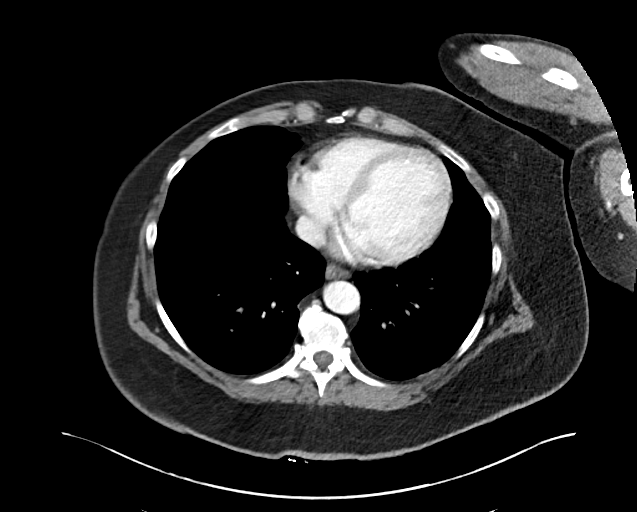

[Series 6: abd pelvis 2.00 br40 s3 cor · coronal · 0.91mm/px · 3 of 200 slices shown]
[im 67/200  soft-tissue]
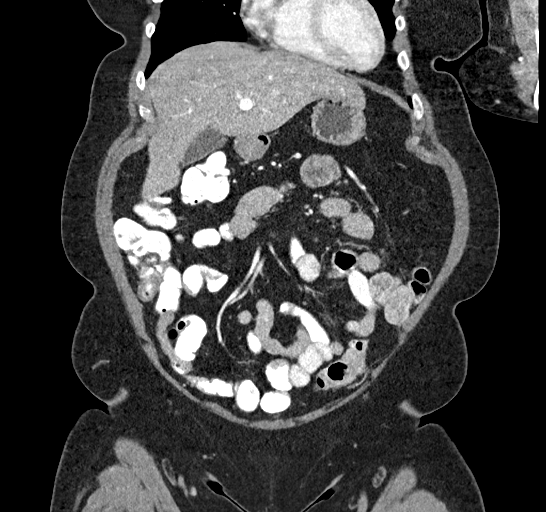
[im 89/200  soft-tissue]
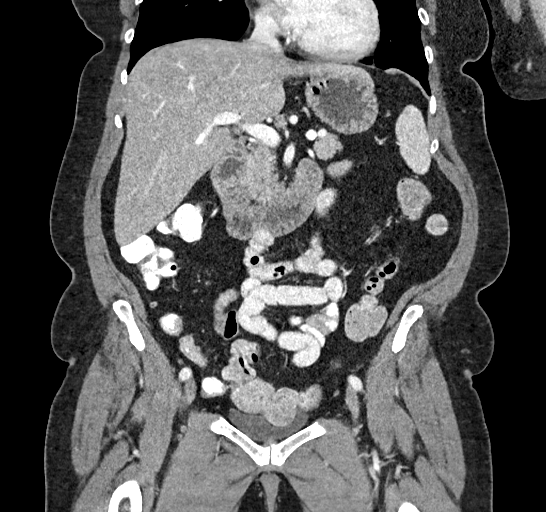
[im 111/200  soft-tissue]
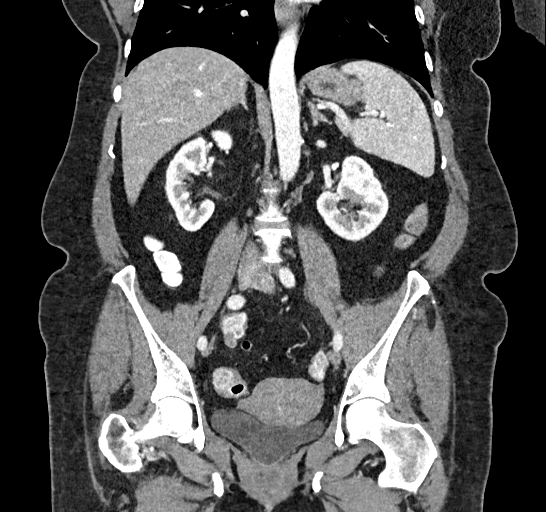

[14 of 46 positions shown; findings below may reference images not displayed]

FINDINGS: Lower chest:  No contributory findings.

Hepatobiliary: Hepatic steatosis.No evidence of biliary obstruction
or stone.

Pancreas: Unremarkable.

Spleen: Unremarkable.

Adrenals/Urinary Tract: Negative adrenals. No hydronephrosis or
stone. At least partial duplication of the bilateral ureters.
Unremarkable bladder.

Stomach/Bowel: History of proctocolitis. There is no detected
extraluminal inflammation or wall thickening. Rectal mucosal density
is not convincing for inflammatory hyperenhancement when allowing
for intravascular contrast and ingested luminal contrast. No
obstruction or visible enteritis. Negative appendix. Mild
diverticulosis, mainly seen at the sigmoid.

Vascular/Lymphatic: No acute vascular abnormality. No mass or
adenopathy.

Reproductive:Low-density myometrial masses as with fibroids, up to
3.8 cm. Probable exophytic left cornual fibroid. Negative ovaries.

Other: No ascites or pneumoperitoneum.

Musculoskeletal: No acute abnormalities. Lower lumbar facet
arthropathy with grade 1 anterolisthesis at L4-5.
IMPRESSION: 1. No acute finding.  No visible inflammation.
2. Mild colonic diverticulosis.
3. Hepatic steatosis.
4. Fibroid uterus.

## 2019-05-22 ENCOUNTER — Other Ambulatory Visit: Payer: Self-pay

## 2019-05-22 ENCOUNTER — Encounter: Payer: Self-pay | Admitting: Cardiology

## 2019-05-22 ENCOUNTER — Ambulatory Visit (INDEPENDENT_AMBULATORY_CARE_PROVIDER_SITE_OTHER): Payer: BC Managed Care – PPO | Admitting: Cardiology

## 2019-05-22 VITALS — BP 110/68 | HR 67 | Ht 67.0 in | Wt 227.0 lb

## 2019-05-22 DIAGNOSIS — E1142 Type 2 diabetes mellitus with diabetic polyneuropathy: Secondary | ICD-10-CM | POA: Diagnosis not present

## 2019-05-22 DIAGNOSIS — I10 Essential (primary) hypertension: Secondary | ICD-10-CM | POA: Diagnosis not present

## 2019-05-22 DIAGNOSIS — I428 Other cardiomyopathies: Secondary | ICD-10-CM

## 2019-05-22 NOTE — Progress Notes (Signed)
Cardiology Office Note:    Date:  05/22/2019   ID:  Meagan Allen, DOB 1955/12/15, MRN [id number]  PCP:  Dianna Rossetti, NP (Inactive)  Cardiologist:  Jenne Campus, MD    Referring MD: No ref. provider found   Chief Complaint  Patient presents with  . Follow-up  Doing well  History of Present Illness:    Meagan Allen is a 63 y.o. female with past medical history significant for cardiomyopathy ejection fraction 35 to 40%, diabetes, multiple joint pains.  She comes today to my office for follow-up of all seems to be doing well tolerating therapy well the goal is to augment her medical therapy hopefully improving her left ventricle ejection fraction which was normal last year and now it is diminished what we do today I will check a Chem-7, discontinue her amlodipine, double Entresto if kidney function is normal.  Past Medical History:  Diagnosis Date  . Anemia   . Anxiety   . Arthritis    osteo  . Cancer [facility])    thyroid cancer  . CHF (congestive heart failure) ([place])    diagnosed feb 2019  . Depression   . Dyspnea   . Fibromyalgia   . GERD (gastroesophageal reflux disease)   . Hearing loss of right ear   . Heart murmur   . History of colon polyps   . History of thyroid cancer 2007;  2009   dx papillary thyroid cancer w/ mets to cervical lymph nodes  . Hyperlipidemia   . Hypertension   . IBS (irritable bowel syndrome)   . Left rotator cuff tear   . Migraine   . Nonischemic cardiomyopathy [facility]) cardiologist-  dr Edyth Gunnels   per cardiac cath 08-28-2017  moderate LV dysfunction w/ diffuse hypocontractility ,  ef 35-40%  . 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 PONV (postoperative nausea and vomiting)    last colonoscopy propfol bp dropped and vomiting  . Post-surgical hypothyroidism   . Rosacea   . Type 2 diabetes mellitus ([place])    followed by pcp  . Ulcerative proctosigmoiditis  ([place])     Past Surgical History:  Procedure Laterality Date  . CARDIOVASCULAR STRESS TEST  08-20-2017   dr Agustin Cree   High risk nuclear study w/ large irreverisible defect in the basal and mid inferoseptal, inferior, inferolateral walls and apical septal, inferior walls with small peri infarct ischemia in the apical anterior & lateral walls (consistant w/ prior MI )/  no ST segment deviation noted /  nuclear stress ef 36%/  recommended cardiac cath  . CARPOMETACARPAL ([place]) FUSION OF THUMB Bilateral 2003  . COLONOSCOPY WITH PROPOFOL N/A 05/06/2013   Procedure: COLONOSCOPY WITH PROPOFOL;  Surgeon: Garlan Fair, MD;  Location: [facility];  Service: Endoscopy;  Laterality: N/A;  . D & C HYSTEROSCOPY /  RESECTION POLYP/ ROLLER BALL ABLATION  02-26-2004   dr Kathyrn Drown  [facility]  . DILATION AND CURETTAGE OF UTERUS  1984  . EXCISION MORTON'S NEUROMA Left 1996  . giant cell tumor  2011   Left ankle 2011  . JOINT REPLACEMENT    . KNEE ARTHROSCOPY  08-16-2010  dr Beola Cord  [facility];  05/ 2012   right x2 left x1  . LEFT HEART CATH AND CORONARY ANGIOGRAPHY N/A 08/28/2017   Procedure: LEFT HEART CATH AND CORONARY ANGIOGRAPHY;  Surgeon: Troy Sine, MD;  Location: [facility];  Service: Cardiovascular;  Laterality: N/A;   large normal coronary arteries in a dominant RCA system;  moderate LV systoic dysfunction w/ diffuse hypocontractility (compatible w/ nonischemic cardiomyopathy), LV end diastoilc pressure normal, LVEF 35-45% by visual estimate  . NASAL LACRIMAL DUCT SURGERY  06/14/2009  . REDO RIGHT MODIFIED RADICAL NECK DISSECTION  08-29-2007    [facility]  . SHOULDER ARTHROSCOPY WITH ROTATOR CUFF REPAIR AND SUBACROMIAL DECOMPRESSION Left 10/17/2017   Procedure: Left shoulder mini open rotator cuff repair, subacromial decompression;  Surgeon: [name], MD;  Location: [facility];  Service: Orthopedics;  Laterality: Left;  Interscalene Block  . SHOULDER SURGERY     Left mini- open rotator cuff repair Dr.  Tonita Cong 10-17-17  . TOTAL KNEE ARTHROPLASTY Right 06/03/2018   Procedure: RIGHT TOTAL KNEE ARTHROPLASTY;  Surgeon: Vickey Huger, MD;  Location: [facility];  Service: Orthopedics;  Laterality: Right;  . TOTAL THYROIDECTOMY  2007  -- [facility]   w/ dissection lymph nodes  . TRANSTHORACIC ECHOCARDIOGRAM  08-20-2017  dr Agustin Cree   ef 25-30%, severe diffuse hypokinesis with no identifiable regional variations, but with profound dyssynchrony, due to arrhythmia insuffient to evaluate LV diastolic dysfunction/  trivial MR/  mild LAE/ Ventricular septum motion abnormal funtion,dyssynergy, & paradox  . WRIST SURGERY Right 2001    Current Medications: Current Meds  Medication Sig  . amLODipine (NORVASC) 2.5 MG tablet Take 1 tablet (2.5 mg total) by mouth daily.  Marland Kitchen aspirin EC 81 MG tablet Take 81 mg by mouth daily.  Marland Kitchen buPROPion (WELLBUTRIN XL) 150 MG 24 hr tablet Take 450 mg by mouth every morning.   . calcium carbonate (OSCAL) 1500 (600 Ca) MG TABS tablet Take 1,500 mg by mouth daily with breakfast.  . cetirizine (ZYRTEC) 10 MG tablet Take 10 mg by mouth daily.  . Cholecalciferol (VITAMIN D) 50 MCG (2000 UT) CAPS Take 1 capsule by mouth daily.  . clonazePAM (KLONOPIN) 0.5 MG tablet Take 0.5 mg by mouth at bedtime.   . CONTOUR NEXT TEST test strip USE TO CHECK BLOOD SUGAR 4 TIMES DAILY  . ENTRESTO 24-26 MG TAKE 1 TABLET BY MOUTH TWICE A DAY  . furosemide (LASIX) 20 MG tablet Take 1 tablet (20 mg total) by mouth daily.  Marland Kitchen gabapentin (NEURONTIN) 100 MG capsule Take 1 capsule by mouth at bedtime.  . hydroxychloroquine (PLAQUENIL) 200 MG tablet Take 1 tablet by mouth 2 (two) times daily.  . Insulin Glargine (LANTUS SOLOSTAR) 100 UNIT/ML Solostar Pen Inject 80 Units into the skin daily at 10 pm.   . levothyroxine (SYNTHROID, LEVOTHROID) 137 MCG tablet Take 137 mcg by mouth at bedtime.   Marland Kitchen linaclotide (LINZESS) 72 MCG capsule Take 72 mcg by mouth every other day.   . liraglutide (VICTOZA) 18 MG/3ML SOPN Inject 1.2 mg  into the skin at bedtime.   . mesalamine (LIALDA) 1.2 g EC tablet Take 2.4 g by mouth 2 (two) times daily.  . metFORMIN (GLUCOPHAGE-XR) 500 MG 24 hr tablet Take 1,000 mg by mouth every evening.  . metoprolol succinate (TOPROL-XL) 100 MG 24 hr tablet TAKE 1 TABLET BY MOUTH DAILY WITH OR IMMEDIATELY FOLLOWING A MEAL.  Marland Kitchen Misc Natural Products (COSAMIN ASU ADVANCED FORMULA) CAPS Take 2 capsules by mouth daily.   . Multiple Vitamins-Minerals (HAIR SKIN & NAILS ADVANCED PO) Take 1 tablet by mouth daily.  . pantoprazole (PROTONIX) 40 MG tablet Take 40 mg by mouth daily.  . polyethylene glycol powder (GLYCOLAX/MIRALAX) powder Take 17 g by mouth daily as needed (for constipation.).  Allergies:   Sulfa antibiotics, Bydureon [exenatide], Invokana [canagliflozin], Jardiance [empagliflozin], Lisinopril, Pravastatin, and Propofol   Social History   Socioeconomic History  . Marital status: Married    Spouse name: Jeneen Rinks  . Number of children: 2  . Years of education: 69  . Highest education level: Not on file  Occupational History  . Occupation: Banker for [employer]: [employer]  Social Needs  . Financial resource strain: Not on file  . Food insecurity    Worry: Not on file    Inability: Not on file  . Transportation needs    Medical: Not on file    Non-medical: Not on file  Tobacco Use  . Smoking status: Former Smoker    Packs/day: 3.00    Years: 10.00    Pack years: 30.00    Types: Cigarettes    Quit date: 07/17/1980    Years since quitting: 38.8  . Smokeless tobacco: Never Used  Substance and Sexual Activity  . Alcohol use: No  . Drug use: No  . Sexual activity: Yes    Birth control/protection: Post-menopausal  Lifestyle  . Physical activity    Days per week: Not on file    Minutes per session: Not on file  . Stress: Not on file  Relationships  . Social Herbalist on phone: Not on file    Gets together: Not on file    Attends  religious service: Not on file    Active member of club or organization: Not on file    Attends meetings of clubs or organizations: Not on file    Relationship status: Not on file  Other Topics Concern  . Not on file  Social History Narrative   Kellen was born in [place], [place]. She moved to [place] in 1978 when her family moved to this state. Lyndsee currently lives in [place] with her husband of 21 years. They have 2 adult children and 1 grandson. 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     Family History: The patient's family history includes Arthritis in her mother; Asthma in her son; Cancer in her brother; Depression in her mother; Diabetes in her father; Gout in her brother, brother, and brother; Heart disease in her father and sister; Hypertension in her father; Stroke in her mother. There is no history of Breast cancer. ROS:   Please see the history of present illness.    All 14 point review of systems negative except as described per history of present illness  EKGs/Labs/Other Studies Reviewed:      Recent Labs: 01/15/2019: ALT 41 02/12/2019: Hemoglobin 12.7; Platelets 297 03/31/2019: BUN 11; Creatinine, Ser 0.89; Potassium 4.5; Sodium 143  Recent Lipid Panel    Component Value Date/Time   CHOL 192 03/27/2018 0937   TRIG 183 (H) 03/27/2018 0937   HDL 41 03/27/2018 0937   CHOLHDL 4.7 (H) 03/27/2018 0937   LDLCALC 114 (H) 03/27/2018 0937    Physical Exam:    VS:  BP 110/68   Pulse 67   Ht 5' 7"  (1.702 m)   Wt 227 lb (103 kg)   SpO2 97%   BMI 35.55 kg/m     Wt Readings from Last 3 Encounters:  05/22/19 227 lb (103 kg)  03/14/19 229 lb 6.4 oz (104.1 kg)  03/06/19 234 lb 12.8 oz (106.5 kg)     GEN:  Well nourished, well developed in no acute distress  HEENT: Normal NECK: No JVD; No carotid bruits LYMPHATICS: No lymphadenopathy CARDIAC: RRR, no murmurs, no rubs, no gallops RESPIRATORY:  Clear to auscultation without rales,  wheezing or rhonchi  ABDOMEN: Soft, non-tender, non-distended MUSCULOSKELETAL:  No edema; No deformity  SKIN: Warm and dry LOWER EXTREMITIES: no swelling NEUROLOGIC:  Alert and oriented x 3 PSYCHIATRIC:  Normal affect   ASSESSMENT:    1. Nonischemic cardiomyopathy ([place])   2. Essential hypertension   3. Type 2 diabetes mellitus with diabetic polyneuropathy, unspecified whether long term insulin use (HCC)    PLAN:    In order of problems listed above:  1. Nonischemic cardiomyopathy plan as outlined above 2. Essential hypertension blood pressure on the lower side we will discontinue Norvasc will double Entresto if Chem-7 is normal 3. Type 2 diabetes followed by internal medicine team stable   Medication Adjustments/Labs and Tests Ordered: Current medicines are reviewed at length with the patient today.  Concerns regarding medicines are outlined above.  No orders of the defined types were placed in this encounter.  Medication changes: No orders of the defined types were placed in this encounter.   Signed, Park Liter, MD, [facility] 05/22/2019 3:21 PM    [place]

## 2019-05-22 NOTE — Patient Instructions (Addendum)
Medication Instructions:  Your physician has recommended you make the following change in your medication:   STOP: Amlodipine   *If you need a refill on your cardiac medications before your next appointment, please call your pharmacy*  Lab Work: Your physician recommends that you return for lab work today: bmp   If you have labs (blood work) drawn today and your tests are completely normal, you will receive your results only by: Marland Kitchen MyChart Message (if you have MyChart) OR . A paper copy in the mail If you have any lab test that is abnormal or we need to change your treatment, we will call you to review the results.  Testing/Procedures: None.   Follow-Up: At [facility], you and your health needs are our priority.  As part of our continuing mission to provide you with exceptional heart care, we have created designated Provider Care Teams.  These Care Teams include your primary Cardiologist (physician) and Advanced Practice Providers (APPs -  Physician Assistants and Nurse Practitioners) who all work together to provide you with the care you need, when you need it.  Your next appointment:   2 months    The format for your next appointment:   In Person  Provider:   You may see Dr. Agustin Cree or the following Advanced Practice Provider on your designated Care Team:    Laurann Montana, FNP   Other Instructions

## 2019-05-22 NOTE — Addendum Note (Signed)
Addended by: Ashok Norris on: 05/22/2019 03:27 PM   Modules accepted: Orders

## 2019-05-23 LAB — BASIC METABOLIC PANEL
BUN/Creatinine Ratio: 14 (ref 12–28)
BUN: 12 mg/dL (ref 8–27)
CO2: 24 mmol/L (ref 20–29)
Calcium: 10.1 mg/dL (ref 8.7–10.3)
Chloride: 104 mmol/L (ref 96–106)
Creatinine, Ser: 0.87 mg/dL (ref 0.57–1.00)
GFR calc Af Amer: 81 mL/min/{1.73_m2} (ref >59)
GFR calc non Af Amer: 71 mL/min/{1.73_m2} (ref >59)
Glucose: 159 mg/dL — ABNORMAL HIGH (ref 65–99)
Potassium: 4.4 mmol/L (ref 3.5–5.2)
Sodium: 143 mmol/L (ref 134–144)

## 2019-05-27 ENCOUNTER — Telehealth: Payer: Self-pay | Admitting: Emergency Medicine

## 2019-05-27 DIAGNOSIS — I428 Other cardiomyopathies: Secondary | ICD-10-CM

## 2019-05-27 NOTE — Telephone Encounter (Signed)
Left message for patient to return call for lab results. 

## 2019-05-28 MED ORDER — ENTRESTO 49-51 MG PO TABS: 1.0000 | ORAL_TABLET | Freq: Two times a day (BID) | ORAL | 1 refills | Status: DC

## 2019-05-28 NOTE — Telephone Encounter (Signed)
Patient called back and was informed of lab results and per Dr. Agustin Cree to increase entresto to 49/51 mg twice daily and have labs rechecked in 1  Week. Patient verbally understands. No further questions.

## 2019-05-28 NOTE — Addendum Note (Signed)
Addended by: Ashok Norris on: 05/28/2019 08:54 AM   Modules accepted: Orders

## 2019-06-07 LAB — BASIC METABOLIC PANEL
BUN/Creatinine Ratio: 16 (ref 12–28)
BUN: 16 mg/dL (ref 8–27)
CO2: 25 mmol/L (ref 20–29)
Calcium: 10 mg/dL (ref 8.7–10.3)
Chloride: 102 mmol/L (ref 96–106)
Creatinine, Ser: 1.02 mg/dL — ABNORMAL HIGH (ref 0.57–1.00)
GFR calc Af Amer: 67 mL/min/{1.73_m2} (ref >59)
GFR calc non Af Amer: 58 mL/min/{1.73_m2} — ABNORMAL LOW (ref >59)
Glucose: 115 mg/dL — ABNORMAL HIGH (ref 65–99)
Potassium: 4.4 mmol/L (ref 3.5–5.2)
Sodium: 143 mmol/L (ref 134–144)

## 2019-06-09 DIAGNOSIS — L732 Hidradenitis suppurativa: Secondary | ICD-10-CM | POA: Insufficient documentation

## 2019-06-19 DIAGNOSIS — R7989 Other specified abnormal findings of blood chemistry: Secondary | ICD-10-CM | POA: Insufficient documentation

## 2019-06-23 ENCOUNTER — Telehealth: Payer: Self-pay | Admitting: Cardiology

## 2019-06-23 NOTE — Telephone Encounter (Signed)
Patient called requesting to speak to you

## 2019-06-23 NOTE — Telephone Encounter (Signed)
Called patient. She reports for the past 3 weeks she has felt weak and very tired. Yesterday she also felt dizzy, and sweaty, she checked her blood pressure and it was 96/59. Her blood pressure came back up to 117/69 and it is better today also but she wants to know what could be causing this and if she can do something diffeently. Her plaquenil was increased in November she takes 400 mg once a day. She also checked her blood sugar during her dizziness, and sweating yesterday it was fine 107. She has had sleep studies in the [place] but never told to have sleep apnea she snores due to her deviated septum. Will consult with Dr. Agustin Cree.

## 2019-06-25 NOTE — Telephone Encounter (Signed)
Patient returned your call.  Please call her back

## 2019-06-25 NOTE — Telephone Encounter (Signed)
Called patient back. She reports she hasn't felt as bad as she did the other day. For now she will monitor and let us know if something changes or get worse. No further questions.

## 2019-06-25 NOTE — Telephone Encounter (Signed)
Left message for patient to return call.

## 2019-06-25 NOTE — Telephone Encounter (Signed)
Her blood pressure could be low because of Entresto however I prefer to continue if she is doing well overall I will just continue present medications.

## 2019-06-27 DIAGNOSIS — H52203 Unspecified astigmatism, bilateral: Secondary | ICD-10-CM | POA: Insufficient documentation

## 2019-06-27 DIAGNOSIS — H5213 Myopia, bilateral: Secondary | ICD-10-CM | POA: Insufficient documentation

## 2019-06-27 DIAGNOSIS — H524 Presbyopia: Secondary | ICD-10-CM | POA: Insufficient documentation

## 2019-07-15 ENCOUNTER — Ambulatory Visit (INDEPENDENT_AMBULATORY_CARE_PROVIDER_SITE_OTHER): Payer: BC Managed Care – PPO | Admitting: Cardiology

## 2019-07-15 ENCOUNTER — Other Ambulatory Visit: Payer: Self-pay

## 2019-07-15 ENCOUNTER — Encounter: Payer: Self-pay | Admitting: Cardiology

## 2019-07-15 VITALS — BP 128/80 | HR 89 | Ht 67.0 in | Wt 229.8 lb

## 2019-07-15 DIAGNOSIS — I10 Essential (primary) hypertension: Secondary | ICD-10-CM

## 2019-07-15 DIAGNOSIS — I428 Other cardiomyopathies: Secondary | ICD-10-CM | POA: Diagnosis not present

## 2019-07-15 DIAGNOSIS — E1142 Type 2 diabetes mellitus with diabetic polyneuropathy: Secondary | ICD-10-CM

## 2019-07-15 NOTE — Patient Instructions (Signed)
Medication Instructions:  No Changes   *If you need a refill on your cardiac medications before your next appointment, please call your pharmacy*  Lab Work: None Ordered  If you have labs (blood work) drawn today and your tests are completely normal, you will receive your results only by: Marland Kitchen MyChart Message (if you have MyChart) OR . A paper copy in the mail If you have any lab test that is abnormal or we need to change your treatment, we will call you to review the results.  Testing/Procedures: None      Follow-Up: At [facility], you and your health needs are our priority.  As part of our continuing mission to provide you with exceptional heart care, we have created designated Provider Care Teams.  These Care Teams include your primary Cardiologist (physician) and Advanced Practice Providers (APPs -  Physician Assistants and Nurse Practitioners) who all work together to provide you with the care you need, when you need it.  Your next appointment:   3 month(s)  The format for your next appointment:   In Person  Provider:   Jenne Campus, MD  Other Instructions None

## 2019-07-15 NOTE — Progress Notes (Signed)
Cardiology Office Note:    Date:  07/15/2019   ID:  Meagan Allen, DOB 1955/08/29, MRN [id number]  PCP:  Meagan Rossetti, NP (Inactive)  Cardiologist:  Meagan Campus, MD    Referring MD: No ref. provider found   No chief complaint on file. Doing better  History of Present Illness:    Meagan Allen is a 63 y.o. female with history of cardiomyopathy ejection fraction 35 to 45% diagnosed in 2019 then in the summer 2019 echocardiogram showed normalization of left ventricle ejection fraction but we will repeat the test in September of this year we find out again the same ejection fraction as before 35 to 45%.  She did have a cardiac catheterization done in 2019 which showed normal coronaries.  I tried to put her on right medication follow-up with echocardiogram.  She comes today to my office follow-up doing better.  About 2 weeks ago she called [place] complaining of being weak tired exhausted having low blood pressure.  There was some thinking about potentially cutting down her Entresto but eventually she ended up holding on with the present dose and seems to be tolerating medication well.  We talked about different options regarding augmenting her medical therapy.  She can benefit from Aldactone as well as she can benefit from either [place].  What prevent me from putting her on this medication is the fact that she does have quite significant yeast infection in the groin area which may be aggravated by dose medications.  Also recently she changed primary care physician and new primary care physician was able to convince her to take some medication for cholesterol.  She does remember the med name of this medication but this is some statin.  She takes this 3 times a week.  Will wait for results of fasting lipid profile and then hopefully she will agree to be put on PCSK9 agent if cholesterol still will be acceptable which I expect will be the case.  Past Medical History:  Diagnosis Date  .  Anemia   . Anxiety   . Arthritis    osteo  . Cancer [facility])    thyroid cancer  . CHF (congestive heart failure) ([place])    diagnosed feb 2019  . Depression   . Dyspnea   . Fibromyalgia   . GERD (gastroesophageal reflux disease)   . Hearing loss of right ear   . Heart murmur   . History of colon polyps   . History of thyroid cancer 2007;  2009   dx papillary thyroid cancer w/ mets to cervical lymph nodes  . Hyperlipidemia   . Hypertension   . IBS (irritable bowel syndrome)   . Left rotator cuff tear   . Migraine   . Nonischemic cardiomyopathy [facility]) cardiologist-  dr Edyth Gunnels   per cardiac cath 08-28-2017  moderate LV dysfunction w/ diffuse hypocontractility ,  ef 35-40%  . 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 PONV (postoperative nausea and vomiting)    last colonoscopy propfol bp dropped and vomiting  . Post-surgical hypothyroidism   . Rosacea   . Type 2 diabetes mellitus ([place])    followed by pcp  . 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[place])     Past Surgical History:  Procedure Laterality Date  . CARDIOVASCULAR STRESS TEST  08-20-2017   dr Agustin Cree   High risk nuclear study w/ large irreverisible defect in the basal and mid inferoseptal,  inferior, inferolateral walls and apical septal, inferior walls with small peri infarct ischemia in the apical anterior & lateral walls (consistant w/ prior MI )/  no ST segment deviation noted /  nuclear stress ef 36%/  recommended cardiac cath  . CARPOMETACARPAL ([place]) FUSION OF THUMB Bilateral 2003  . COLONOSCOPY WITH PROPOFOL N/A 05/06/2013   Procedure: COLONOSCOPY WITH PROPOFOL;  Surgeon: Garlan Fair, MD;  Location: [facility] ENDOSCOPY;  Service: Endoscopy;  Laterality: N/A;  . D & C HYSTEROSCOPY /  RESECTION POLYP/ ROLLER BALL ABLATION  02-26-2004   dr Kathyrn Drown  [facility]  . DILATION AND CURETTAGE OF UTERUS  1984  . EXCISION MORTON'S NEUROMA Left 1996  . giant cell  tumor  2011   Left ankle 2011  . JOINT REPLACEMENT    . KNEE ARTHROSCOPY  08-16-2010  dr Beola Cord  [facility];  05/ 2012   right x2 left x1  . LEFT HEART CATH AND CORONARY ANGIOGRAPHY N/A 08/28/2017   Procedure: LEFT HEART CATH AND CORONARY ANGIOGRAPHY;  Surgeon: Troy Sine, MD;  Location: [facility];  Service: Cardiovascular;  Laterality: N/A;   large normal coronary arteries in a dominant RCA system;  moderate LV systoic dysfunction w/ diffuse hypocontractility (compatible w/ nonischemic cardiomyopathy), LV end diastoilc pressure normal, LVEF 35-45% by visual estimate  . NASAL LACRIMAL DUCT SURGERY  06/14/2009  . REDO RIGHT MODIFIED RADICAL NECK DISSECTION  08-29-2007    [facility]  . SHOULDER ARTHROSCOPY WITH ROTATOR CUFF REPAIR AND SUBACROMIAL DECOMPRESSION Left 10/17/2017   Procedure: Left shoulder mini open rotator cuff repair, subacromial decompression;  Surgeon: [name], MD;  Location: [facility];  Service: Orthopedics;  Laterality: Left;  Interscalene Block  . SHOULDER SURGERY     Left mini- open rotator cuff repair Dr. Tonita Cong 10-17-17  . TOTAL KNEE ARTHROPLASTY Right 06/03/2018   Procedure: RIGHT TOTAL KNEE ARTHROPLASTY;  Surgeon: Vickey Huger, MD;  Location: [facility];  Service: Orthopedics;  Laterality: Right;  . TOTAL THYROIDECTOMY  2007  -- [facility]   w/ dissection lymph nodes  . TRANSTHORACIC ECHOCARDIOGRAM  08-20-2017  dr Agustin Cree   ef 25-30%, severe diffuse hypokinesis with no identifiable regional variations, but with profound dyssynchrony, due to arrhythmia insuffient to evaluate LV diastolic dysfunction/  trivial MR/  mild LAE/ Ventricular septum motion abnormal funtion,dyssynergy, & paradox  . WRIST SURGERY Right 2001    Current Medications: Current Meds  Medication Sig  . aspirin EC 81 MG tablet Take 81 mg by mouth daily.  Marland Kitchen buPROPion (WELLBUTRIN XL) 150 MG 24 hr tablet Take 450 mg by mouth every morning.   . cetirizine (ZYRTEC) 10 MG tablet Take 10 mg by mouth daily.  .  Cholecalciferol (VITAMIN D) 50 MCG (2000 UT) CAPS Take 1 capsule by mouth daily.  . clonazePAM (KLONOPIN) 0.5 MG tablet Take 0.5 mg by mouth at bedtime.   . CONTOUR NEXT TEST test strip USE TO CHECK BLOOD SUGAR 4 TIMES DAILY  . furosemide (LASIX) 20 MG tablet Take 1 tablet (20 mg total) by mouth daily.  . Insulin Glargine (LANTUS SOLOSTAR) 100 UNIT/ML Solostar Pen Inject 80 Units into the skin daily at 10 pm.   . levothyroxine (SYNTHROID, LEVOTHROID) 137 MCG tablet Take 137 mcg by mouth at bedtime.   Marland Kitchen linaclotide (LINZESS) 72 MCG capsule Take 72 mcg by mouth every other day.   . liraglutide (VICTOZA) 18 MG/3ML SOPN Inject 1.2 mg into the skin at bedtime.   . mesalamine (LIALDA) 1.2 g EC tablet Take 2.4  g by mouth 2 (two) times daily.  . metFORMIN (GLUCOPHAGE-XR) 500 MG 24 hr tablet Take 1,000 mg by mouth every evening.  . metoprolol succinate (TOPROL-XL) 100 MG 24 hr tablet TAKE 1 TABLET BY MOUTH DAILY WITH OR IMMEDIATELY FOLLOWING A MEAL.  Marland Kitchen Misc Natural Products (COSAMIN ASU ADVANCED FORMULA) CAPS Take 2 capsules by mouth daily.   . Multiple Vitamins-Minerals (HAIR SKIN & NAILS ADVANCED PO) Take 1 tablet by mouth daily.  . pantoprazole (PROTONIX) 40 MG tablet Take 40 mg by mouth daily.  . polyethylene glycol powder (GLYCOLAX/MIRALAX) powder Take 17 g by mouth daily as needed (for constipation.).   Marland Kitchen rosuvastatin (CRESTOR) 5 MG tablet Take 5 mg by mouth 3 (three) times a week.  . sacubitril-valsartan (ENTRESTO) 49-51 MG Take 1 tablet by mouth 2 (two) times daily.     Allergies:   Sulfa antibiotics, Bydureon [exenatide], Invokana [canagliflozin], Jardiance [empagliflozin], Lisinopril, Pravastatin, and Propofol   Social History   Socioeconomic History  . Marital status: Married    Spouse name: Jeneen Rinks  . Number of children: 2  . Years of education: 88  . Highest education level: Not on file  Occupational History  . Occupation: Banker for [employer]: [employer]  Tobacco Use  . Smoking status: Former Smoker    Packs/day: 3.00    Years: 10.00    Pack years: 30.00    Types: Cigarettes    Quit date: 07/17/1980    Years since quitting: 39.0  . Smokeless tobacco: Never Used  Substance and Sexual Activity  . Alcohol use: No  . Drug use: No  . Sexual activity: Yes    Birth control/protection: Post-menopausal  Other Topics Concern  . Not on file  Social History Narrative   Maelys was born in [place], [place]. She moved to [place] in 1978 when her family moved to this state. Nicholette currently lives in [place] with her husband of 59 years. They have 2 adult children and 1 grandson. 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   Social Determinants of Health   Financial Resource Strain:   . Difficulty of Paying Living Expenses: Not on file  Food Insecurity:   . Worried About Charity fundraiser in the Last Year: Not on file  . Ran Out of Food in the Last Year: Not on file  Transportation Needs:   . Lack of Transportation (Medical): Not on file  . Lack of Transportation (Non-Medical): Not on file  Physical Activity:   . Days of Exercise per Week: Not on file  . Minutes of Exercise per Session: Not on file  Stress:   . Feeling of Stress : Not on file  Social Connections:   . Frequency of Communication with Friends and Family: Not on file  . Frequency of Social Gatherings with Friends and Family: Not on file  . Attends Religious Services: Not on file  . Active Member of Clubs or Organizations: Not on file  . Attends Archivist Meetings: Not on file  . Marital Status: Not on file     Family History: The patient's family history includes Arthritis in her mother; Asthma in her son; Cancer in her brother; Depression in her mother; Diabetes in her father; Gout in her brother, brother, and brother; Heart disease in her father and sister; Hypertension in her father; Stroke in her mother.  There is no history of Breast cancer. ROS:  Please see the history of present illness.    All 14 point review of systems negative except as described per history of present illness  EKGs/Labs/Other Studies Reviewed:      Recent Labs: 01/15/2019: ALT 41 02/12/2019: Hemoglobin 12.7; Platelets 297 06/06/2019: BUN 16; Creatinine, Ser 1.02; Potassium 4.4; Sodium 143  Recent Lipid Panel    Component Value Date/Time   CHOL 192 03/27/2018 0937   TRIG 183 (H) 03/27/2018 0937   HDL 41 03/27/2018 0937   CHOLHDL 4.7 (H) 03/27/2018 0937   LDLCALC 114 (H) 03/27/2018 0937    Physical Exam:    VS:  BP 128/80   Pulse 89   Ht 5' 7"  (1.702 m)   Wt 229 lb 12.8 oz (104.2 kg)   SpO2 98%   BMI 35.99 kg/m     Wt Readings from Last 3 Encounters:  07/15/19 229 lb 12.8 oz (104.2 kg)  05/22/19 227 lb (103 kg)  03/14/19 229 lb 6.4 oz (104.1 kg)     GEN:  Well nourished, well developed in no acute distress HEENT: Normal NECK: No JVD; No carotid bruits LYMPHATICS: No lymphadenopathy CARDIAC: RRR, no murmurs, no rubs, no gallops RESPIRATORY:  Clear to auscultation without rales, wheezing or rhonchi  ABDOMEN: Soft, non-tender, non-distended MUSCULOSKELETAL:  No edema; No deformity  SKIN: Warm and dry LOWER EXTREMITIES: no swelling NEUROLOGIC:  Alert and oriented x 3 PSYCHIATRIC:  Normal affect   ASSESSMENT:    1. Nonischemic cardiomyopathy ([place])   2. Essential hypertension   3. Type 2 diabetes mellitus with diabetic polyneuropathy, unspecified whether long term insulin use (HCC)    PLAN:    In order of problems listed above:  1. Nonischemic cardiomyopathy on Entresto will continue, also on beta-blocker which I will continue.  Probably not the best candidate for Jardiance because of frequent yeast infection.  In about 3 months we will repeat echocardiogram. 2. Essential hypertension blood pressure well controlled 3. Type 2 diabetes stable followed by internal medicine team doing well from  that point review.   Medication Adjustments/Labs and Tests Ordered: Current medicines are reviewed at length with the patient today.  Concerns regarding medicines are outlined above.  No orders of the defined types were placed in this encounter.  Medication changes: No orders of the defined types were placed in this encounter.   Signed, Park Liter, MD, [facility] 07/15/2019 3:54 PM    [place]

## 2019-07-16 ENCOUNTER — Ambulatory Visit: Payer: Self-pay | Admitting: Cardiology

## 2019-07-16 ENCOUNTER — Other Ambulatory Visit: Payer: Self-pay | Admitting: Cardiology

## 2019-07-16 DIAGNOSIS — M109 Gout, unspecified: Secondary | ICD-10-CM | POA: Insufficient documentation

## 2019-07-16 NOTE — Telephone Encounter (Signed)
Left message for patient to return call regarding allergy.

## 2019-07-17 NOTE — Telephone Encounter (Signed)
Spoke to patient she reports she has been taking with no issues.

## 2019-07-25 ENCOUNTER — Telehealth: Payer: Self-pay | Admitting: Emergency Medicine

## 2019-07-25 NOTE — Telephone Encounter (Signed)
Left message for patient to return call to let her know we need to schedule her for a echocardiogram.

## 2019-07-28 NOTE — Telephone Encounter (Signed)
Patient returned your call about scheduling echo and no order was in and we need order to schedule at [facility]. Please enter order and patient will vall and schedule echo. Please call her back.

## 2019-07-29 NOTE — Telephone Encounter (Signed)
Patent returned your call.

## 2019-07-30 ENCOUNTER — Other Ambulatory Visit: Payer: Self-pay

## 2019-07-30 ENCOUNTER — Telehealth: Payer: Self-pay

## 2019-07-30 DIAGNOSIS — I428 Other cardiomyopathies: Secondary | ICD-10-CM

## 2019-07-30 DIAGNOSIS — I10 Essential (primary) hypertension: Secondary | ICD-10-CM

## 2019-07-30 NOTE — Telephone Encounter (Signed)
Called patient and spoke with her. Let her know that she needed an echo and that the order had been placed for this to be scheduled. She thanked me. She said she will call back to get this scheduled, patient understood that Dr. Raliegh Ip wanted this done soon.

## 2019-07-31 ENCOUNTER — Other Ambulatory Visit: Payer: Self-pay

## 2019-07-31 ENCOUNTER — Ambulatory Visit (HOSPITAL_BASED_OUTPATIENT_CLINIC_OR_DEPARTMENT_OTHER)
Admission: RE | Admit: 2019-07-31 | Discharge: 2019-07-31 | Disposition: A | Discharge status: Home / Self Care | Payer: BC Managed Care – PPO | Source: Ambulatory Visit | Attending: Cardiology | Admitting: Cardiology

## 2019-07-31 DIAGNOSIS — I10 Essential (primary) hypertension: Secondary | ICD-10-CM | POA: Insufficient documentation

## 2019-07-31 DIAGNOSIS — I428 Other cardiomyopathies: Secondary | ICD-10-CM | POA: Insufficient documentation

## 2019-07-31 NOTE — Progress Notes (Signed)
  Echocardiogram 2D Echocardiogram has been performed.  Meagan Allen 07/31/2019, 10:46 AM

## 2019-08-04 ENCOUNTER — Telehealth: Payer: Self-pay | Admitting: Cardiology

## 2019-08-04 NOTE — Telephone Encounter (Signed)
Returned patients call regarding her echo results. The patient has been notified of the result and verbalized understanding.   Pt would like clarification from Dr. Raliegh Ip if it is ok that she begins her biological medication that is being prescribed by her rheumatologist.

## 2019-08-04 NOTE — Telephone Encounter (Signed)
Patient returning call for echo results. 

## 2019-08-05 ENCOUNTER — Ambulatory Visit (INDEPENDENT_AMBULATORY_CARE_PROVIDER_SITE_OTHER): Payer: BLUE CROSS/BLUE SHIELD | Admitting: Podiatry

## 2019-08-05 ENCOUNTER — Other Ambulatory Visit: Payer: Self-pay

## 2019-08-05 DIAGNOSIS — M1000 Idiopathic gout, unspecified site: Secondary | ICD-10-CM | POA: Diagnosis not present

## 2019-08-05 DIAGNOSIS — R0989 Other specified symptoms and signs involving the circulatory and respiratory systems: Secondary | ICD-10-CM | POA: Diagnosis not present

## 2019-08-05 DIAGNOSIS — R609 Edema, unspecified: Secondary | ICD-10-CM

## 2019-08-05 DIAGNOSIS — E1149 Type 2 diabetes mellitus with other diabetic neurological complication: Secondary | ICD-10-CM | POA: Diagnosis not present

## 2019-08-05 NOTE — Patient Instructions (Signed)
Look at getting an Oofos or Vionic shoe/slip-on  I have ordered a circulation test for you. If you do not hear for them about scheduling within the next 1 week, or you have any questions please give [place] a call at [phone].

## 2019-08-05 NOTE — Telephone Encounter (Signed)
Overall her echocardiogram looks slightly better but I will still be reluctant to go with biological and check for rheumatoid arthritis.  Could you please find out which exactly medication we talking about starting for rheumatoid arthritis

## 2019-08-06 LAB — C-REACTIVE PROTEIN: CRP: 4.8 mg/L (ref <8.0)

## 2019-08-06 LAB — URIC ACID: Uric Acid, Serum: 9 mg/dL — ABNORMAL HIGH (ref 2.5–7.0)

## 2019-08-06 LAB — SEDIMENTATION RATE: Sed Rate: 22 mm/h (ref 0–30)

## 2019-08-08 ENCOUNTER — Telehealth: Payer: Self-pay | Admitting: *Deleted

## 2019-08-08 DIAGNOSIS — R0989 Other specified symptoms and signs involving the circulatory and respiratory systems: Secondary | ICD-10-CM

## 2019-08-08 NOTE — Telephone Encounter (Signed)
I informed pt of Dr. Leigh Aurora review of results and recommendations. Pt states her Rheumatologist is Dr. Manuella Ghazi with [facility] states she was to have [place] of her legs but no one has called.

## 2019-08-08 NOTE — Telephone Encounter (Signed)
Attempted to call pt on home and work phone-no answer. LMTCB

## 2019-08-08 NOTE — Telephone Encounter (Signed)
I informed pt of Dr. Leigh Aurora orders and [facility] [address] would call her to schedule. Faxed orders to [facility].

## 2019-08-08 NOTE — Telephone Encounter (Signed)
Follow Up  Patient returning call. Please give patient a call back at [phone].

## 2019-08-08 NOTE — Telephone Encounter (Signed)
There is a lot of cardiovascular warnings for this medication.  I be very reluctant to recommend to use this medication unless there is no other alternatives.

## 2019-08-08 NOTE — Telephone Encounter (Signed)
-----   Message from Trula Slade, DPM sent at 08/07/2019  7:11 AM EST ----- Val- can you let her know that the uric acid level is high? Can you send this and a note to her rheumatologist and inquire about starting allopurinol or another medication for chronic gout? Thanks.

## 2019-08-08 NOTE — Telephone Encounter (Signed)
Pt states the RA biological medicine she is inquiring about is: xeljanz 95m BID

## 2019-08-08 NOTE — Telephone Encounter (Signed)
Can you please order arterial duplex? Thanks.

## 2019-08-11 NOTE — Telephone Encounter (Signed)
Faxed note requesting advise concerning beginning allopurinol or other medication to control chronic gout, labs and clinicals to [facility] - Dr. Manuella Ghazi.

## 2019-08-11 NOTE — Progress Notes (Signed)
Subjective: Tria presents the office today for concerns of occasional swelling to both of her feet as well as for diabetic foot exam.  She states that she was previously seen by orthopedics and thought she had gout.  She was in follow-up with rheumatology was prescribed colchicine which did help.  Her last uric acid level 7.8.  She still gets intermittent swelling and pain to the feet but currently she states she is not having significant symptoms.  She also wants to make sure her circulation is adequate as this has not been evaluated.  She denies any recent ulcerations. Denies any systemic complaints such as fevers, chills, nausea, vomiting. No acute changes since last appointment, and no other complaints at this time.   Objective: AAO x3, NAD Today on exam PT pulses 2/4, PT pulses 1/4.  CRT less than 3 seconds Previously been diagnosed with neuropathy Mild swelling present to bilateral lower extremities but there is no erythema or warmth.  No specific area of tenderness identified today. No open lesions or pre-ulcerative lesions.  No pain with calf compression, swelling, warmth, erythema  Assessment: 64 year old female with likely chronic gout, type 2 diabetes with neuropathy  Plan: -All treatment options discussed with the patient including all alternatives, risks, complications.  -I did an ABI in the office today which was read as "PAD" bilaterally. I can palpate pulses but DP are decreased.  This could be because of swelling but given the abnormal read in the office today will order arterial studies. -We will recheck uric acid level is some concern about chronic gout. -Discussed elevation. -Daily foot inspection. -Patient encouraged to call the office with any questions, concerns, change in symptoms.   Trula Slade DPM

## 2019-08-12 ENCOUNTER — Encounter: Payer: Self-pay | Admitting: Cardiology

## 2019-08-12 ENCOUNTER — Other Ambulatory Visit: Payer: Self-pay

## 2019-08-12 ENCOUNTER — Ambulatory Visit (INDEPENDENT_AMBULATORY_CARE_PROVIDER_SITE_OTHER): Payer: BC Managed Care – PPO | Admitting: Cardiology

## 2019-08-12 VITALS — BP 142/76 | HR 78 | Ht 67.0 in | Wt 229.0 lb

## 2019-08-12 DIAGNOSIS — I509 Heart failure, unspecified: Secondary | ICD-10-CM | POA: Insufficient documentation

## 2019-08-12 DIAGNOSIS — G43909 Migraine, unspecified, not intractable, without status migrainosus: Secondary | ICD-10-CM

## 2019-08-12 DIAGNOSIS — I420 Dilated cardiomyopathy: Secondary | ICD-10-CM | POA: Diagnosis not present

## 2019-08-12 DIAGNOSIS — I10 Essential (primary) hypertension: Secondary | ICD-10-CM | POA: Diagnosis not present

## 2019-08-12 DIAGNOSIS — E119 Type 2 diabetes mellitus without complications: Secondary | ICD-10-CM | POA: Diagnosis not present

## 2019-08-12 NOTE — Patient Instructions (Signed)
Medication Instructions:  Your physician recommends that you continue on your current medications as directed. Please refer to the Current Medication list given to you today.  *If you need a refill on your cardiac medications before your next appointment, please call your pharmacy*  Lab Work: None.  If you have labs (blood work) drawn today and your tests are completely normal, you will receive your results only by: Marland Kitchen MyChart Message (if you have MyChart) OR . A paper copy in the mail If you have any lab test that is abnormal or we need to change your treatment, we will call you to review the results.  Testing/Procedures: None.   Follow-Up: At [facility], you and your health needs are our priority.  As part of our continuing mission to provide you with exceptional heart care, we have created designated Provider Care Teams.  These Care Teams include your primary Cardiologist (physician) and Advanced Practice Providers (APPs -  Physician Assistants and Nurse Practitioners) who all work together to provide you with the care you need, when you need it.  Your next appointment:   Follow up as scheduled

## 2019-08-12 NOTE — Progress Notes (Signed)
Cardiology Office Note:    Date:  08/12/2019   ID:  Meagan Allen, DOB 11/09/1955, MRN [id number]  PCP:  Meagan Rossetti, NP (Inactive)  Cardiologist:  Meagan Campus, MD    Referring MD: No ref. provider found   Chief Complaint  Patient presents with  . Follow-up    Discuss Medications    History of Present Illness:    Meagan Allen is a 64 y.o. female   history of cardiomyopathy ejection fraction 35 to 45% diagnosed in 2019 then in the summer 2019 echocardiogram showed normalization of left ventricle ejection fraction but we will repeat the test in September of this year we find out again the same ejection fraction as before 35 to 45%.  She did have a cardiac catheterization done in 2019 which showed normal coronaries.    Recently she had a meeting with her rheumatologist and we talked about potentially starting some biological agent.  However this agent carries some significant risks which includes risk of cardiac death.  We did repeat echocardiogram to recheck left ventricle ejection fraction.  The truth is that ejection fraction improved to about 45% but still somewhat diminished.  I think he reached the point that careful balance between risk and benefits of this medication need to be considered.  1 approach could be we can temporarily try the agent and see if she got significant benefits of this if not simply abandon this approach if she has again considerable risk and benefits.  Cardiac wise doing well.  Denies having any swelling of lower extremities except when she got out.  She try to ride bicycle stationary on a regular basis however she says she can write only for about 10 minutes and she is completely exhausted.  She blames partially on the fact that she is not active enough which probably is the case.  Past Medical History:  Diagnosis Date  . Anemia   . Anxiety   . Arthritis    osteo  . Cancer [facility])    thyroid cancer  . CHF (congestive heart failure) ([place])    diagnosed  feb 2019  . Depression   . Dyspnea   . Fibromyalgia   . GERD (gastroesophageal reflux disease)   . Hearing loss of right ear   . Heart murmur   . History of colon polyps   . History of thyroid cancer 2007;  2009   dx papillary thyroid cancer w/ mets to cervical lymph nodes  . Hyperlipidemia   . Hypertension   . IBS (irritable bowel syndrome)   . Left rotator cuff tear   . Migraine   . Nonischemic cardiomyopathy [facility]) cardiologist-  Meagan Meagan Allen   per cardiac cath 08-28-2017  moderate LV dysfunction w/ diffuse hypocontractility ,  ef 35-40%  . 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 PONV (postoperative nausea and vomiting)    last colonoscopy propfol bp dropped and vomiting  . Post-surgical hypothyroidism   . Rosacea   . Type 2 diabetes mellitus ([place])    followed by pcp  . 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[place])     Past Surgical History:  Procedure Laterality Date  . CARDIOVASCULAR STRESS TEST  08-20-2017   Meagan Allen   High risk nuclear study w/ large irreverisible defect in the basal and mid inferoseptal, inferior, inferolateral walls and apical septal, inferior walls with small peri infarct ischemia in the apical anterior & lateral walls (consistant  w/ prior MI )/  no ST segment deviation noted /  nuclear stress ef 36%/  recommended cardiac cath  . CARPOMETACARPAL ([place]) FUSION OF THUMB Bilateral 2003  . COLONOSCOPY WITH PROPOFOL N/A 05/06/2013   Procedure: COLONOSCOPY WITH PROPOFOL;  Surgeon: Meagan Fair, MD;  Location: [facility] ENDOSCOPY;  Service: Endoscopy;  Laterality: N/A;  . D & C HYSTEROSCOPY /  RESECTION POLYP/ ROLLER BALL ABLATION  02-26-2004   Meagan Allen  [facility]  . DILATION AND CURETTAGE OF UTERUS  1984  . EXCISION MORTON'S NEUROMA Left 1996  . giant cell tumor  2011   Left ankle 2011  . JOINT REPLACEMENT    . KNEE ARTHROSCOPY  08-16-2010  Meagan Beola Cord  [facility];  05/ 2012   right x2 left x1  .  LEFT HEART CATH AND CORONARY ANGIOGRAPHY N/A 08/28/2017   Procedure: LEFT HEART CATH AND CORONARY ANGIOGRAPHY;  Surgeon: Meagan Sine, MD;  Location: [place] CV LAB;  Service: Cardiovascular;  Laterality: N/A;   large normal coronary arteries in a dominant RCA system;  moderate LV systoic dysfunction w/ diffuse hypocontractility (compatible w/ nonischemic cardiomyopathy), LV end diastoilc pressure normal, LVEF 35-45% by visual estimate  . NASAL LACRIMAL DUCT SURGERY  06/14/2009  . REDO RIGHT MODIFIED RADICAL NECK DISSECTION  08-29-2007    Meagan Allen  . SHOULDER ARTHROSCOPY WITH ROTATOR CUFF REPAIR AND SUBACROMIAL DECOMPRESSION Left 10/17/2017   Procedure: Left shoulder mini open rotator cuff repair, subacromial decompression;  Surgeon: [name], MD;  Location: [facility] ORS;  Service: Orthopedics;  Laterality: Left;  Interscalene Block  . SHOULDER SURGERY     Left mini- open rotator cuff repair Meagan. Tonita Cong 10-17-17  . TOTAL KNEE ARTHROPLASTY Right 06/03/2018   Procedure: RIGHT TOTAL KNEE ARTHROPLASTY;  Surgeon: Vickey Huger, MD;  Location: [facility] ORS;  Service: Orthopedics;  Laterality: Right;  . TOTAL THYROIDECTOMY  2007  -- Meagan Allen   w/ dissection lymph nodes  . TRANSTHORACIC ECHOCARDIOGRAM  08-20-2017  Meagan Allen   ef 25-30%, severe diffuse hypokinesis with no identifiable regional variations, but with profound dyssynchrony, due to arrhythmia insuffient to evaluate LV diastolic dysfunction/  trivial MR/  mild LAE/ Ventricular septum motion abnormal funtion,dyssynergy, & paradox  . WRIST SURGERY Right 2001    Current Medications: Current Meds  Medication Sig  . aspirin EC 81 MG tablet Take 81 mg by mouth daily.  Marland Kitchen buPROPion (WELLBUTRIN XL) 150 MG 24 hr tablet Take 450 mg by mouth every morning.   . Calcium-Magnesium-Vitamin D (CALCIUM 1200+D3 PO) Take by mouth 2 (two) times daily.   . cetirizine (ZYRTEC) 10 MG tablet Take 10 mg by mouth daily.  . chlorhexidine (HIBICLENS) 4 % external liquid Hibiclens  4 % topical liquid  BATH SKIN FOLDS WITH THIS ONCE A WEEK. AVOID EYES AND EARS.  Marland Kitchen Cholecalciferol (VITAMIN D) 50 MCG (2000 UT) CAPS Take 1 capsule by mouth daily.  . clindamycin (CLEOCIN T) 1 % external solution APPLY TO AFFECTED AREAS ON BODY DAILY (SKIN FOLDS  GROIN, UNDER BREASTS)  . clonazePAM (KLONOPIN) 0.5 MG tablet Take 0.5 mg by mouth at bedtime.   . Colchicine (MITIGARE) 0.6 MG CAPS Mitigare 0.6 mg capsule  TAKE 1 CAPSULE (0.6 MG TOTAL) BY MOUTH DAILY.  . CONTOUR NEXT TEST test strip USE TO CHECK BLOOD SUGAR 4 TIMES DAILY  . diclofenac Sodium (VOLTAREN) 1 % GEL diclofenac 1 % topical gel  APPLY 2 G TO KNEES ANKLES AS NEEDED TWICE A DAY   NO MORE THAN 24 G  IN THE DAY TOTAL.  Marland Kitchen ENTRESTO 49-51 MG TAKE 1 TABLET BY MOUTH TWICE A DAY  . Insulin Glargine (LANTUS SOLOSTAR) 100 UNIT/ML Solostar Pen Inject 80 Units into the skin daily at 10 pm.   . levothyroxine (SYNTHROID, LEVOTHROID) 137 MCG tablet Take 137 mcg by mouth at bedtime.   Marland Kitchen linaclotide (LINZESS) 72 MCG capsule Take 72 mcg by mouth every other day.   . liraglutide (VICTOZA) 18 MG/3ML SOPN Inject 1.2 mg into the skin at bedtime.   . metFORMIN (GLUCOPHAGE-XR) 500 MG 24 hr tablet Take 1,000 mg by mouth every evening.  . methocarbamol (ROBAXIN) 500 MG tablet methocarbamol 500 mg tablet  TAKE 1 TABLET TWO TIMES A DAY FOR FIBROMYALGIA  . metoprolol succinate (TOPROL-XL) 100 MG 24 hr tablet TAKE 1 TABLET BY MOUTH DAILY WITH OR IMMEDIATELY FOLLOWING A MEAL.  Marland Kitchen Misc Natural Products (COSAMIN ASU ADVANCED FORMULA) CAPS Take 2 capsules by mouth daily.   Marland Kitchen nystatin (MYCOSTATIN/NYSTOP) powder nystatin 100,000 unit/gram topical powder  APPLY TO SKIN FOLDS DAILY FOR YEAST.  . pantoprazole (PROTONIX) 40 MG tablet Take 40 mg by mouth daily.  . polyethylene glycol powder (GLYCOLAX/MIRALAX) powder Take 17 g by mouth daily as needed (for constipation.).   Marland Kitchen polyethylene glycol-electrolytes (NULYTELY) 420 g solution peg-electrolyte solution 420 gram  oral solution  MIX AND DRINK AS DIRECTED  . rosuvastatin (CRESTOR) 5 MG tablet Take 5 mg by mouth 3 (three) times a week.  . Tofacitinib Citrate (XELJANZ) 5 MG TABS Xeljanz 5 mg tablet  . traMADol (ULTRAM) 50 MG tablet tramadol 50 mg tablet  TAKE 1 TABLET UP TO 3 TIMES DAILY AS NEEDED FOR ARTHRITIS PAIN  . Turmeric (QC TUMERIC COMPLEX) 500 MG CAPS      Allergies:   Sulfa antibiotics, Bydureon [exenatide], Invokana [canagliflozin], Jardiance [empagliflozin], Lisinopril, Pravastatin, and Propofol   Social History   Socioeconomic History  . Marital status: Married    Spouse name: Jeneen Rinks  . Number of children: 2  . Years of education: 57  . Highest education level: Not on file  Occupational History  . Occupation: Banker for [employer]: [employer]  Tobacco Use  . Smoking status: Former Smoker    Packs/day: 3.00    Years: 10.00    Pack years: 30.00    Types: Cigarettes    Quit date: 07/17/1980    Years since quitting: 39.0  . Smokeless tobacco: Never Used  Substance and Sexual Activity  . Alcohol use: No  . Drug use: No  . Sexual activity: Yes    Birth control/protection: Post-menopausal  Other Topics Concern  . Not on file  Social History Narrative   Sylvi was born in [place], [place]. She moved to [place] in 1978 when her family moved to this state. Lakayla currently lives in [place] with her husband of 83 years. They have 2 adult children and 1 grandson. 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   Social Determinants of Health   Financial Resource Strain:   . Difficulty of Paying Living Expenses: Not on file  Food Insecurity:   . Worried About Charity fundraiser in the Last Year: Not on file  . Ran Out of Food in the Last Year: Not on file  Transportation Needs:   . Lack of Transportation (Medical): Not on file  . Lack of Transportation (Non-Medical): Not on file  Physical Activity:   . Days of  Exercise per  Week: Not on file  . Minutes of Exercise per Session: Not on file  Stress:   . Feeling of Stress : Not on file  Social Connections:   . Frequency of Communication with Friends and Family: Not on file  . Frequency of Social Gatherings with Friends and Family: Not on file  . Attends Religious Services: Not on file  . Active Member of Clubs or Organizations: Not on file  . Attends Archivist Meetings: Not on file  . Marital Status: Not on file     Family History: The patient's family history includes Arthritis in her mother; Asthma in her son; Cancer in her brother; Depression in her mother; Diabetes in her father; Gout in her brother, brother, and brother; Heart disease in her father and sister; Hypertension in her father; Stroke in her mother. There is no history of Breast cancer. ROS:   Please see the history of present illness.    All 14 point review of systems negative except as described per history of present illness  EKGs/Labs/Other Studies Reviewed:      Recent Labs: 01/15/2019: ALT 41 02/12/2019: Hemoglobin 12.7; Platelets 297 06/06/2019: BUN 16; Creatinine, Ser 1.02; Potassium 4.4; Sodium 143  Recent Lipid Panel    Component Value Date/Time   CHOL 192 03/27/2018 0937   TRIG 183 (H) 03/27/2018 0937   HDL 41 03/27/2018 0937   CHOLHDL 4.7 (H) 03/27/2018 0937   LDLCALC 114 (H) 03/27/2018 0937    Physical Exam:    VS:  BP (!) 142/76   Pulse 78   Ht 5' 7"  (1.702 m)   Wt 229 lb (103.9 kg)   SpO2 97%   BMI 35.87 kg/m     Wt Readings from Last 3 Encounters:  08/12/19 229 lb (103.9 kg)  07/15/19 229 lb 12.8 oz (104.2 kg)  05/22/19 227 lb (103 kg)     GEN:  Well nourished, well developed in no acute distress HEENT: Normal NECK: No JVD; No carotid bruits LYMPHATICS: No lymphadenopathy CARDIAC: RRR, no murmurs, no rubs, no gallops RESPIRATORY:  Clear to auscultation without rales, wheezing or rhonchi  ABDOMEN: Soft, non-tender,  non-distended MUSCULOSKELETAL:  No edema; No deformity  SKIN: Warm and dry LOWER EXTREMITIES: no swelling NEUROLOGIC:  Alert and oriented x 3 PSYCHIATRIC:  Normal affect   ASSESSMENT:    1. Dilated cardiomyopathy ([place])   2. Essential hypertension   3. Diabetes mellitus without complication ([place])    PLAN:    In order of problems listed above:  1. Dilated cardiomyopathy: Ejection fraction improved to 45 continue beta-blocker as well as Entresto.  Not a candidate for Jardiance because of history of yeast infection.  I will continue present management. 2. Essential hypertension blood pressure moderately controlled continue present management. 3. Diabetes mellitus: Followed by internal medicine team stable   Medication Adjustments/Labs and Tests Ordered: Current medicines are reviewed at length with the patient today.  Concerns regarding medicines are outlined above.  No orders of the defined types were placed in this encounter.  Medication changes: No orders of the defined types were placed in this encounter.   Signed, Park Liter, MD, [facility] 08/12/2019 3:06 PM    [facility]

## 2019-08-13 ENCOUNTER — Ambulatory Visit (HOSPITAL_COMMUNITY)
Admission: RE | Admit: 2019-08-13 | Discharge: 2019-08-13 | Disposition: A | Discharge status: Home / Self Care | Payer: BC Managed Care – PPO | Source: Ambulatory Visit | Attending: Cardiology | Admitting: Cardiology

## 2019-08-13 DIAGNOSIS — R0989 Other specified symptoms and signs involving the circulatory and respiratory systems: Secondary | ICD-10-CM | POA: Diagnosis present

## 2019-08-15 ENCOUNTER — Telehealth: Payer: Self-pay | Admitting: *Deleted

## 2019-08-15 NOTE — Telephone Encounter (Signed)
Pt called and I informed of Dr. Leigh Aurora review of results.

## 2019-08-15 NOTE — Telephone Encounter (Signed)
-----   Message from Trula Slade, DPM sent at 08/14/2019  7:01 AM EST ----- Val- please let her know that the circulation test is normal. Thanks.

## 2019-08-15 NOTE — Telephone Encounter (Signed)
Left message requesting a call back to discuss results.

## 2019-09-04 ENCOUNTER — Ambulatory Visit: Payer: BC Managed Care – PPO | Admitting: Rheumatology

## 2019-09-05 ENCOUNTER — Other Ambulatory Visit: Payer: Self-pay | Admitting: Cardiology

## 2019-09-16 ENCOUNTER — Ambulatory Visit (INDEPENDENT_AMBULATORY_CARE_PROVIDER_SITE_OTHER): Payer: BC Managed Care – PPO

## 2019-09-16 ENCOUNTER — Other Ambulatory Visit: Payer: Self-pay

## 2019-09-16 ENCOUNTER — Encounter: Payer: Self-pay | Admitting: Podiatry

## 2019-09-16 ENCOUNTER — Ambulatory Visit: Payer: BC Managed Care – PPO | Admitting: Podiatry

## 2019-09-16 DIAGNOSIS — M722 Plantar fascial fibromatosis: Secondary | ICD-10-CM

## 2019-09-16 DIAGNOSIS — M1000 Idiopathic gout, unspecified site: Secondary | ICD-10-CM

## 2019-09-16 DIAGNOSIS — I739 Peripheral vascular disease, unspecified: Secondary | ICD-10-CM

## 2019-09-16 DIAGNOSIS — L603 Nail dystrophy: Secondary | ICD-10-CM

## 2019-09-16 DIAGNOSIS — E1149 Type 2 diabetes mellitus with other diabetic neurological complication: Secondary | ICD-10-CM

## 2019-09-16 NOTE — Patient Instructions (Signed)

## 2019-09-17 NOTE — Progress Notes (Signed)
Subjective: 64 year old female presents the office today for follow-up evaluation swelling to both feet.  She previous had arterial studies performed which showed adequate circulation.  Her uric acid level was increased and she discussed with her rheumatologist.  They were going to start allopurinol but her parathyroid hormone was off and she want to have this checked prior.  She is going to follow-up with her rheumatologist later this week and likely start allopurinol.  She also has new concerns of right plantar heel pain which is been aching for the last 1 month pain in the morning after activity.  No recent injury.  No radiating pain.  Also she is concerned that her right big toenail started to split on the medial nail border.  She was to make sure is not growing into the skin.  No redness or drainage or any signs of infection. Denies any systemic complaints such as fevers, chills, nausea, vomiting. No acute changes since last appointment, and no other complaints at this time.   Objective: AAO x3, NAD DP/PT pulses palpable bilaterally, CRT less than 3 seconds There is tenderness palpation of the plantar medial tubercle of the calcaneus at the insertion of plantar fascia on the right side.  Plantar fascia appears to be intact.  No pain with lateral compression of calcaneus.  No pain to the Achilles tendon.  No edema, erythema.  Negative Tinel sign. On the medial aspect of right hallux toenail there is a small vertical split in the nail.  There is no edema, erythema or any signs of infection noted today.  No significant pain. No other areas of discomfort bilaterally. No pain with calf compression, swelling, warmth, erythema  Assessment: Right foot plantar fascias with nail splitting right hallux; elevated uric acid  Plan: -All treatment options discussed with the patient including all alternatives, risks, complications.  -She is to follow-up with her rheumatologist medically start allopurinol -In  regards to plantar fasciitis we discussed steroid injection but will get hold off on that today.  She is good to be going to [place] in a couple of weeks.  We will consider an injection before she goes as needed.  Discussed stretching, icing exercises daily.  Discussed new shoes and also orthotics.  She has Voltaren gel that she can use as needed. -Debrided the right hallux toenail with any complications or bleeding.  No significant ingrown toenail today we will continue to monitor. -Patient encouraged to call the office with any questions, concerns, change in symptoms.   Trula Slade DPM

## 2019-09-24 DIAGNOSIS — R82993 Hyperuricosuria: Secondary | ICD-10-CM

## 2019-09-24 HISTORY — DX: Hyperuricosuria: R82.993

## 2019-09-25 ENCOUNTER — Ambulatory Visit: Payer: BC Managed Care – PPO | Admitting: Podiatry

## 2019-09-26 ENCOUNTER — Other Ambulatory Visit: Payer: Self-pay | Admitting: Cardiology

## 2019-10-07 ENCOUNTER — Other Ambulatory Visit: Payer: Self-pay

## 2019-10-07 ENCOUNTER — Encounter: Payer: Self-pay | Admitting: Cardiology

## 2019-10-07 ENCOUNTER — Ambulatory Visit (INDEPENDENT_AMBULATORY_CARE_PROVIDER_SITE_OTHER): Payer: BC Managed Care – PPO | Admitting: Cardiology

## 2019-10-07 VITALS — BP 114/70 | HR 74 | Ht 67.0 in | Wt 228.1 lb

## 2019-10-07 DIAGNOSIS — I428 Other cardiomyopathies: Secondary | ICD-10-CM

## 2019-10-07 NOTE — Patient Instructions (Signed)
Medication Instructions:  Your physician recommends that you continue on your current medications as directed. Please refer to the Current Medication list given to you today. *If you need a refill on your cardiac medications before your next appointment, please call your pharmacy*   Lab Work: None ordered  If you have labs (blood work) drawn today and your tests are completely normal, you will receive your results only by: Marland Kitchen MyChart Message (if you have MyChart) OR . A paper copy in the mail If you have any lab test that is abnormal or we need to change your treatment, we will call you to review the results.   Testing/Procedures: Your physician has requested that you have an echocardiogram 1 WEEK BEFORE 3 MONTH F/U APPT. Echocardiography is a painless test that uses sound waves to create images of your heart. It provides your doctor with information about the size and shape of your heart and how well your heart's chambers and valves are working. This procedure takes approximately one hour. There are no restrictions for this procedure.     Follow-Up: At [facility], you and your health needs are our priority.  As part of our continuing mission to provide you with exceptional heart care, we have created designated Provider Care Teams.  These Care Teams include your primary Cardiologist (physician) and Advanced Practice Providers (APPs -  Physician Assistants and Nurse Practitioners) who all work together to provide you with the care you need, when you need it.  We recommend signing up for the patient portal called "MyChart".  Sign up information is provided on this After Visit Summary.  MyChart is used to connect with patients for Virtual Visits (Telemedicine).  Patients are able to view lab/test results, encounter notes, upcoming appointments, etc.  Non-urgent messages can be sent to your provider as well.   To learn more about what you can do with MyChart, go to [email or web address].     Your next appointment:   3 month(s)  The format for your next appointment:   In Person  Provider:   Jenne Campus, MD   Other Instructions

## 2019-12-17 ENCOUNTER — Encounter: Payer: Self-pay | Admitting: Cardiology

## 2019-12-24 NOTE — Addendum Note (Signed)
Addended by: Teresita Madura on: 12/24/2019 04:26 PM   Modules accepted: Orders

## 2019-12-26 ENCOUNTER — Encounter (INDEPENDENT_AMBULATORY_CARE_PROVIDER_SITE_OTHER): Payer: Self-pay

## 2020-01-02 ENCOUNTER — Other Ambulatory Visit: Payer: Self-pay

## 2020-01-02 ENCOUNTER — Ambulatory Visit (HOSPITAL_COMMUNITY)
Admission: RE | Admit: 2020-01-02 | Discharge: 2020-01-02 | Disposition: A | Discharge status: Home / Self Care | Payer: BC Managed Care – PPO | Source: Ambulatory Visit | Attending: Cardiology | Admitting: Cardiology

## 2020-01-02 DIAGNOSIS — I428 Other cardiomyopathies: Secondary | ICD-10-CM | POA: Diagnosis not present

## 2020-01-02 DIAGNOSIS — I509 Heart failure, unspecified: Secondary | ICD-10-CM | POA: Insufficient documentation

## 2020-01-02 DIAGNOSIS — I110 Hypertensive heart disease with heart failure: Secondary | ICD-10-CM | POA: Diagnosis not present

## 2020-01-02 NOTE — Progress Notes (Signed)
  Echocardiogram 2D Echocardiogram has been performed.  Meagan Allen 01/02/2020, 2:57 PM

## 2020-01-13 ENCOUNTER — Other Ambulatory Visit (HOSPITAL_BASED_OUTPATIENT_CLINIC_OR_DEPARTMENT_OTHER): Payer: BC Managed Care – PPO

## 2020-01-14 ENCOUNTER — Other Ambulatory Visit (HOSPITAL_BASED_OUTPATIENT_CLINIC_OR_DEPARTMENT_OTHER): Payer: BC Managed Care – PPO

## 2020-01-20 ENCOUNTER — Ambulatory Visit: Payer: BC Managed Care – PPO | Admitting: Cardiology

## 2020-01-21 ENCOUNTER — Encounter: Payer: Self-pay | Admitting: Cardiology

## 2020-01-21 ENCOUNTER — Other Ambulatory Visit: Payer: Self-pay

## 2020-01-21 ENCOUNTER — Ambulatory Visit (INDEPENDENT_AMBULATORY_CARE_PROVIDER_SITE_OTHER): Payer: BC Managed Care – PPO | Admitting: Cardiology

## 2020-01-21 VITALS — BP 140/82 | HR 79 | Ht 67.0 in | Wt 225.0 lb

## 2020-01-21 DIAGNOSIS — I428 Other cardiomyopathies: Secondary | ICD-10-CM

## 2020-01-21 DIAGNOSIS — I509 Heart failure, unspecified: Secondary | ICD-10-CM

## 2020-01-21 DIAGNOSIS — I10 Essential (primary) hypertension: Secondary | ICD-10-CM | POA: Diagnosis not present

## 2020-01-21 NOTE — Progress Notes (Signed)
Cardiology Office Note:    Date:  01/21/2020   ID:  Meagan Allen, DOB 14-Sep-1955, MRN [id number]  PCP:  Lennie Odor, [place]  Cardiologist:  Jenne Campus, MD    Referring MD: No ref. provider found   No chief complaint on file. Am doing fair  History of Present Illness:    Meagan Allen is a 64 y.o. female with cardiomyopathy ejection fraction latest assessment about 45 to 50% on appropriate medical therapy.  She did have echocardiogram last time which shows approximately some segmental motion abnormalities.  EKG done today did not show any new changes.  Overall she said she was doing great but within the last week she feels weak tired she does have pain in her left knee as well as right ankle.  She is getting ready to go to [place] and she is worried about this.  Denies having any typical chest pain tightness squeezing pressure burning chest however sometimes when she lays down she will feel heartburn.  She take a proton pump inhibitor which seems to be helping.  Past Medical History:  Diagnosis Date  . Anemia   . Anxiety   . Arthritis    osteo  . Cancer [facility])    thyroid cancer  . CHF (congestive heart failure) ([place])    diagnosed feb 2019  . Depression   . Dyspnea   . Fibromyalgia   . GERD (gastroesophageal reflux disease)   . Hearing loss of right ear   . Heart murmur   . History of colon polyps   . History of thyroid cancer 2007;  2009   dx papillary thyroid cancer w/ mets to cervical lymph nodes  . Hyperlipidemia   . Hypertension   . IBS (irritable bowel syndrome)   . Left rotator cuff tear   . Migraine   . Nonischemic cardiomyopathy [facility]) cardiologist-  dr Edyth Gunnels   per cardiac cath 08-28-2017  moderate LV dysfunction w/ diffuse hypocontractility ,  ef 35-40%  . 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 PONV (postoperative nausea and vomiting)    last colonoscopy propfol bp  dropped and vomiting  . Post-surgical hypothyroidism   . Rosacea   . Type 2 diabetes mellitus ([place])    followed by pcp  . 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[place])     Past Surgical History:  Procedure Laterality Date  . CARDIOVASCULAR STRESS TEST  08-20-2017   dr Agustin Cree   High risk nuclear study w/ large irreverisible defect in the basal and mid inferoseptal, inferior, inferolateral walls and apical septal, inferior walls with small peri infarct ischemia in the apical anterior & lateral walls (consistant w/ prior MI )/  no ST segment deviation noted /  nuclear stress ef 36%/  recommended cardiac cath  . CARPOMETACARPAL ([place]) FUSION OF THUMB Bilateral 2003  . COLONOSCOPY WITH PROPOFOL N/A 05/06/2013   Procedure: COLONOSCOPY WITH PROPOFOL;  Surgeon: Garlan Fair, MD;  Location: [facility];  Service: Endoscopy;  Laterality: N/A;  . D & C HYSTEROSCOPY /  RESECTION POLYP/ ROLLER BALL ABLATION  02-26-2004   dr Kathyrn Drown  [facility]  . DILATION AND CURETTAGE OF UTERUS  1984  . EXCISION MORTON'S NEUROMA Left 1996  . giant cell tumor  2011   Left ankle 2011  . JOINT REPLACEMENT    . KNEE ARTHROSCOPY  08-16-2010  dr Beola Cord  [facility];  05/ 2012   right x2 left x1  .  LEFT HEART CATH AND CORONARY ANGIOGRAPHY N/A 08/28/2017   Procedure: LEFT HEART CATH AND CORONARY ANGIOGRAPHY;  Surgeon: Troy Sine, MD;  Location: [place] CV LAB;  Service: Cardiovascular;  Laterality: N/A;   large normal coronary arteries in a dominant RCA system;  moderate LV systoic dysfunction w/ diffuse hypocontractility (compatible w/ nonischemic cardiomyopathy), LV end diastoilc pressure normal, LVEF 35-45% by visual estimate  . NASAL LACRIMAL DUCT SURGERY  06/14/2009  . REDO RIGHT MODIFIED RADICAL NECK DISSECTION  08-29-2007    [facility]  . SHOULDER ARTHROSCOPY WITH ROTATOR CUFF REPAIR AND SUBACROMIAL DECOMPRESSION Left 10/17/2017   Procedure: Left shoulder mini open rotator cuff repair, subacromial decompression;  Surgeon: [name], MD;  Location: [facility];  Service: Orthopedics;  Laterality: Left;  Interscalene Block  . SHOULDER SURGERY     Left mini- open rotator cuff repair Dr. Tonita Cong 10-17-17  . TOTAL KNEE ARTHROPLASTY Right 06/03/2018   Procedure: RIGHT TOTAL KNEE ARTHROPLASTY;  Surgeon: Vickey Huger, MD;  Location: [facility];  Service: Orthopedics;  Laterality: Right;  . TOTAL THYROIDECTOMY  2007  -- [facility]   w/ dissection lymph nodes  . TRANSTHORACIC ECHOCARDIOGRAM  08-20-2017  dr Agustin Cree   ef 25-30%, severe diffuse hypokinesis with no identifiable regional variations, but with profound dyssynchrony, due to arrhythmia insuffient to evaluate LV diastolic dysfunction/  trivial MR/  mild LAE/ Ventricular septum motion abnormal funtion,dyssynergy, & paradox  . WRIST SURGERY Right 2001    Current Medications: Current Meds  Medication Sig  . aspirin EC 81 MG tablet Take 81 mg by mouth daily.  Marland Kitchen buPROPion (WELLBUTRIN XL) 150 MG 24 hr tablet Take 450 mg by mouth every morning.   . Calcium-Magnesium-Vitamin D (CALCIUM 1200+D3 PO) Take by mouth 2 (two) times daily.   . cetirizine (ZYRTEC) 10 MG tablet Take 10 mg by mouth daily.  . chlorhexidine (HIBICLENS) 4 % external liquid Hibiclens 4 % topical liquid  BATH SKIN FOLDS WITH THIS ONCE A WEEK. AVOID EYES AND EARS.  Marland Kitchen Cholecalciferol (VITAMIN D) 50 MCG (2000 UT) CAPS Take 1 capsule by mouth daily.  . cinacalcet (SENSIPAR) 30 MG tablet Take 30 mg by mouth daily.  . clindamycin (CLEOCIN T) 1 % external solution APPLY TO AFFECTED AREAS ON BODY DAILY (SKIN FOLDS  GROIN, UNDER BREASTS)  . clonazePAM (KLONOPIN) 0.5 MG tablet Take 0.5 mg by mouth at bedtime.   . Colchicine (MITIGARE) 0.6 MG CAPS Mitigare 0.6 mg capsule  TAKE 1 CAPSULE (0.6 MG TOTAL) BY MOUTH DAILY.  . CONTOUR NEXT TEST test strip USE TO CHECK BLOOD SUGAR 4 TIMES DAILY  . diclofenac Sodium (VOLTAREN) 1 % GEL diclofenac 1 % topical gel  APPLY 2 G TO KNEES ANKLES AS NEEDED TWICE A DAY   NO MORE THAN 24 G IN THE  DAY TOTAL.  Marland Kitchen doxycycline (VIBRAMYCIN) 100 MG capsule 100 mg as directed.   Marland Kitchen ENTRESTO 49-51 MG TAKE 1 TABLET BY MOUTH TWICE A DAY  . folic acid (FOLVITE) 1 MG tablet folic acid 1 mg tablet  TAKE 2 TABLETS BY MOUTH EVERY DAY  . furosemide (LASIX) 20 MG tablet TAKE 1 TABLET BY MOUTH EVERY DAY  . Insulin Glargine (LANTUS SOLOSTAR) 100 UNIT/ML Solostar Pen Inject 80 Units into the skin daily at 10 pm.   . levothyroxine (SYNTHROID, LEVOTHROID) 137 MCG tablet Take 137 mcg by mouth at bedtime.   . liraglutide (VICTOZA) 18 MG/3ML SOPN Inject 1.2 mg into the skin at bedtime.   . mesalamine (LIALDA) 1.2  g EC tablet Take 2.4 g by mouth 2 (two) times daily.  . metFORMIN (GLUCOPHAGE-XR) 500 MG 24 hr tablet Take 1,000 mg by mouth every evening.  . methocarbamol (ROBAXIN) 500 MG tablet methocarbamol 500 mg tablet  TAKE 1 TABLET TWO TIMES A DAY FOR FIBROMYALGIA  . metoprolol succinate (TOPROL-XL) 100 MG 24 hr tablet TAKE 1 TABLET BY MOUTH DAILY WITH OR IMMEDIATELY FOLLOWING A MEAL.  Marland Kitchen Misc Natural Products (COSAMIN ASU ADVANCED FORMULA) CAPS Take 2 capsules by mouth daily.   Marland Kitchen nystatin (MYCOSTATIN/NYSTOP) powder nystatin 100,000 unit/gram topical powder  APPLY TO SKIN FOLDS DAILY FOR YEAST.  . pantoprazole (PROTONIX) 40 MG tablet Take 40 mg by mouth daily.  . polyethylene glycol powder (GLYCOLAX/MIRALAX) powder Take 17 g by mouth daily as needed (for constipation.).   Marland Kitchen predniSONE (DELTASONE) 10 MG tablet prednisone 10 mg tablet  TAKE 4 TABLETS ONCE DAILY X 2 DAYS, 3 TABS X 2 DAYS, 2 TABS X 2 DAYS THEN 1 TAB X 2 DAYS  . rosuvastatin (CRESTOR) 5 MG tablet Take 5 mg by mouth 3 (three) times a week.  . Tofacitinib Citrate (XELJANZ) 5 MG TABS Xeljanz 5 mg tablet  . traMADol (ULTRAM) 50 MG tablet tramadol 50 mg tablet  TAKE 1 TABLET UP TO 3 TIMES DAILY AS NEEDED FOR ARTHRITIS PAIN  . Turmeric (QC TUMERIC COMPLEX) 500 MG CAPS      Allergies:   Hydroxychloroquine, Sulfa antibiotics, Bydureon [exenatide],  Invokana [canagliflozin], Jardiance [empagliflozin], Lisinopril, Pravastatin, and Propofol   Social History   Socioeconomic History  . Marital status: Married    Spouse name: Jeneen Rinks  . Number of children: 2  . Years of education: 57  . Highest education level: Not on file  Occupational History  . Occupation: Banker for [employer]: [employer]  Tobacco Use  . Smoking status: Former Smoker    Packs/day: 3.00    Years: 10.00    Pack years: 30.00    Types: Cigarettes    Quit date: 07/17/1980    Years since quitting: 39.5  . Smokeless tobacco: Never Used  Vaping Use  . Vaping Use: Never used  Substance and Sexual Activity  . Alcohol use: No  . Drug use: No  . Sexual activity: Yes    Birth control/protection: Post-menopausal  Other Topics Concern  . Not on file  Social History Narrative   Nyeli was born in [place], [place]. She moved to [place] in 1978 when her family moved to this state. Shadell currently lives in [place] with her husband of 65 years. They have 2 adult children and 1 grandson. 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   Social Determinants of Health   Financial Resource Strain:   . Difficulty of Paying Living Expenses:   Food Insecurity:   . Worried About Charity fundraiser in the Last Year:   . Arboriculturist in the Last Year:   Transportation Needs:   . Film/video editor (Medical):   Marland Kitchen Lack of Transportation (Non-Medical):   Physical Activity:   . Days of Exercise per Week:   . Minutes of Exercise per Session:   Stress:   . Feeling of Stress :   Social Connections:   . Frequency of Communication with Friends and Family:   . Frequency of Social Gatherings with Friends and Family:   . Attends Religious Services:   . Active Member of Clubs or Organizations:   .  Attends Archivist Meetings:   Marland Kitchen Marital Status:      Family History: The patient's family history  includes Arthritis in her mother; Asthma in her son; Cancer in her brother; Depression in her mother; Diabetes in her father; Gout in her brother, brother, and brother; Heart disease in her father and sister; Hypertension in her father; Stroke in her mother. There is no history of Breast cancer. ROS:   Please see the history of present illness.    All 14 point review of systems negative except as described per history of present illness  EKGs/Labs/Other Studies Reviewed:      Recent Labs: 02/12/2019: Hemoglobin 12.7; Platelets 297 06/06/2019: BUN 16; Creatinine, Ser 1.02; Potassium 4.4; Sodium 143  Recent Lipid Panel    Component Value Date/Time   CHOL 192 03/27/2018 0937   TRIG 183 (H) 03/27/2018 0937   HDL 41 03/27/2018 0937   CHOLHDL 4.7 (H) 03/27/2018 0937   LDLCALC 114 (H) 03/27/2018 0937    Physical Exam:    VS:  BP 140/82 (BP Location: Right Arm, Patient Position: Sitting, Cuff Size: Large)   Pulse 79   Ht 5' 7"  (1.702 m)   Wt 225 lb (102.1 kg)   SpO2 96%   BMI 35.24 kg/m     Wt Readings from Last 3 Encounters:  01/21/20 225 lb (102.1 kg)  10/07/19 228 lb 1.9 oz (103.5 kg)  08/12/19 229 lb (103.9 kg)     GEN:  Well nourished, well developed in no acute distress HEENT: Normal NECK: No JVD; No carotid bruits LYMPHATICS: No lymphadenopathy CARDIAC: RRR, no murmurs, no rubs, no gallops RESPIRATORY:  Clear to auscultation without rales, wheezing or rhonchi  ABDOMEN: Soft, non-tender, non-distended MUSCULOSKELETAL:  No edema; No deformity  SKIN: Warm and dry LOWER EXTREMITIES: no swelling NEUROLOGIC:  Alert and oriented x 3 PSYCHIATRIC:  Normal affect   ASSESSMENT:    1. Nonischemic cardiomyopathy ([place])   2. Congestive heart failure, unspecified HF chronicity, unspecified heart failure type ([place])   3. Essential hypertension    PLAN:    In order of problems listed above:  1. Nonischemic cardiomyopathy on appropriate medical therapy which I will  continue. 2. Congestive heart failure seems to be compensated continue present management. 3. Essential hypertension blood pressure well controlled with blood pressure today 140/82.  She said when to check it at home which much better. 4. Atypical symptoms of what appears to be heartburn but happening at the meantime.  EKG did not show any new changes.  I will ask her to see me back in about 3 months to see how things are going and ask her to take proton pump inhibitor on a regular basis. 5. Dyslipidemia I did review her K PN her LDL 77 HDL 48 this is from 17 December 2019.  We will continue present management   Medication Adjustments/Labs and Tests Ordered: Current medicines are reviewed at length with the patient today.  Concerns regarding medicines are outlined above.  Orders Placed This Encounter  Procedures  . EKG 12-Lead   Medication changes: No orders of the defined types were placed in this encounter.   Signed, Park Liter, MD, [facility] 01/21/2020 3:05 PM    [facility]

## 2020-01-21 NOTE — Patient Instructions (Signed)
Medication Instructions:  Your physician recommends that you continue on your current medications as directed. Please refer to the Current Medication list given to you today.  *If you need a refill on your cardiac medications before your next appointment, please call your pharmacy*   Lab Work: NONE If you have labs (blood work) drawn today and your tests are completely normal, you will receive your results only by: Marland Kitchen MyChart Message (if you have MyChart) OR . A paper copy in the mail If you have any lab test that is abnormal or we need to change your treatment, we will call you to review the results.   Testing/Procedures: NONE   Follow-Up: At [facility], you and your health needs are our priority.  As part of our continuing mission to provide you with exceptional heart care, we have created designated Provider Care Teams.  These Care Teams include your primary Cardiologist (physician) and Advanced Practice Providers (APPs -  Physician Assistants and Nurse Practitioners) who all work together to provide you with the care you need, when you need it.  We recommend signing up for the patient portal called "MyChart".  Sign up information is provided on this After Visit Summary.  MyChart is used to connect with patients for Virtual Visits (Telemedicine).  Patients are able to view lab/test results, encounter notes, upcoming appointments, etc.  Non-urgent messages can be sent to your provider as well.   To learn more about what you can do with MyChart, go to [email or web address].    Your next appointment:   3-4 month(s)  The format for your next appointment:   In Person  Provider:   Jenne Campus, MD

## 2020-02-13 DIAGNOSIS — M19079 Primary osteoarthritis, unspecified ankle and foot: Secondary | ICD-10-CM

## 2020-02-13 DIAGNOSIS — M19071 Primary osteoarthritis, right ankle and foot: Secondary | ICD-10-CM | POA: Insufficient documentation

## 2020-02-13 HISTORY — DX: Primary osteoarthritis, unspecified ankle and foot: M19.079

## 2020-02-27 ENCOUNTER — Other Ambulatory Visit: Payer: Self-pay | Admitting: Cardiology

## 2020-03-02 ENCOUNTER — Telehealth: Payer: Self-pay | Admitting: *Deleted

## 2020-03-02 NOTE — Telephone Encounter (Signed)
   [facility] Pre-operative Risk Assessment   Request for surgical clearance:  1. What type of surgery is being performed? TKA   2. When is this surgery scheduled? TBD   3. What type of clearance is required (medical clearance vs. Pharmacy clearance to hold med vs. Both)? Both   4. Are there any medications that need to be held prior to surgery and how long? ASA   5. Practice name and name of physician performing surgery? [facility]    6. What is the office phone number? [phone]   7.   What is the office fax number? [phone]  8.   Anesthesia type (None, local, MAC, general) ?    Meagan Allen Meagan Allen 03/02/2020, 4:46 PM  _________________________________________________________________   (provider comments below)

## 2020-03-03 NOTE — Telephone Encounter (Signed)
The real question is does it have to be held for this procedure.  We do a lot of surgery with aspirin on board.  If needed, yes it can be held

## 2020-03-03 NOTE — Telephone Encounter (Signed)
   Primary Cardiologist: Jenne Campus, MD  Chart reviewed as part of pre-operative protocol coverage. Given past medical history and time since last visit, based on ACC/AHA guidelines, Meagan Allen would be at acceptable risk for the planned procedure without further cardiovascular testing.   If needed his aspirin may be held for 5-7 days prior to his procedure.  Please resume as soon as hemostasis is achieved.  I will route this recommendation to the requesting party via Epic fax function and remove from pre-op pool.  Please call with questions.  Jossie Ng. Jimmylee Ratterree NP-C    03/03/2020, 12:32 PM [address] Office [phone] Fax [phone]

## 2020-03-03 NOTE — Telephone Encounter (Signed)
Meagan Allen 64 year old female is requesting TKA.  She was last seen in the clinic on 01/21/2020 by you.  During that time she expressed atypical chest pain which was believed to be related to reflux.  She was instructed to take her PPI on a regular basis and follow-up in 3 months.  May her aspirin be held for the procedure?  Her PMH includes coronary artery disease S/p cardiac catheterization 2/19 showed nonischemic cardiomyopathy with an LVEF of 35-45%, echocardiogram 6/18/2021showed EF of 45-50%, essential hypertension, and migraine.   Thank you for your help.  Please direct response to CV DIV preop pool.  Meagan Allen. Meagan Sermons NP-C    03/03/2020, 9:49 AM Meagan Allen [address] Office [phone] Fax [phone]

## 2020-03-05 ENCOUNTER — Telehealth: Payer: Self-pay | Admitting: *Deleted

## 2020-03-05 ENCOUNTER — Other Ambulatory Visit: Payer: Self-pay | Admitting: Internal Medicine

## 2020-03-05 ENCOUNTER — Other Ambulatory Visit: Payer: Self-pay | Admitting: Physician Assistant

## 2020-03-05 DIAGNOSIS — Z1231 Encounter for screening mammogram for malignant neoplasm of breast: Secondary | ICD-10-CM

## 2020-03-05 NOTE — Telephone Encounter (Signed)
   [facility] Pre-operative Risk Assessment    Request for surgical clearance:  1. What type of surgery is being performed? Total Knee Arthroscopy   2. When is this surgery scheduled? TBD   3. What type of clearance is required (medical clearance vs. Pharmacy clearance to hold med vs. Both)? Both  4. Are there any medications that need to be held prior to surgery and how long?Aspirin   5. Practice name and name of physician performing surgery? [facility], unable to read signature   6. What is your office phone number [phone]    7.   What is your office fax number [phone]  8.   Anesthesia type (None, local, MAC, general) ? Not listed   Roselie Skinner 03/05/2020, 3:48 PM  _________________________________________________________________   (provider comments below)

## 2020-03-08 NOTE — Telephone Encounter (Signed)
This is a duplicate request. Will remove from pool. I will resend the previous clearance to the surgeon's office

## 2020-03-09 NOTE — Telephone Encounter (Addendum)
Patient is called stating the provider who is doing her surgery did not receive clearance for her surgery.  She would like it faxed to them at [phone]

## 2020-03-15 DIAGNOSIS — R4586 Emotional lability: Secondary | ICD-10-CM

## 2020-03-15 DIAGNOSIS — R519 Headache, unspecified: Secondary | ICD-10-CM

## 2020-03-15 DIAGNOSIS — R4701 Aphasia: Secondary | ICD-10-CM

## 2020-03-15 HISTORY — DX: Headache, unspecified: R51.9

## 2020-03-15 HISTORY — DX: Emotional lability: R45.86

## 2020-03-15 HISTORY — DX: Aphasia: R47.01

## 2020-03-16 ENCOUNTER — Other Ambulatory Visit: Payer: Self-pay

## 2020-03-16 ENCOUNTER — Ambulatory Visit
Admission: RE | Admit: 2020-03-16 | Discharge: 2020-03-16 | Disposition: A | Discharge status: Home / Self Care | Payer: BC Managed Care – PPO | Source: Ambulatory Visit | Attending: Physician Assistant | Admitting: Physician Assistant

## 2020-03-16 DIAGNOSIS — Z1231 Encounter for screening mammogram for malignant neoplasm of breast: Secondary | ICD-10-CM

## 2020-03-18 ENCOUNTER — Other Ambulatory Visit: Payer: Self-pay | Admitting: Neurology

## 2020-03-18 DIAGNOSIS — R410 Disorientation, unspecified: Secondary | ICD-10-CM

## 2020-03-18 DIAGNOSIS — R519 Headache, unspecified: Secondary | ICD-10-CM

## 2020-03-18 DIAGNOSIS — D329 Benign neoplasm of meninges, unspecified: Secondary | ICD-10-CM

## 2020-03-28 ENCOUNTER — Other Ambulatory Visit: Payer: Self-pay | Admitting: Cardiology

## 2020-04-09 ENCOUNTER — Ambulatory Visit
Admission: RE | Admit: 2020-04-09 | Discharge: 2020-04-09 | Disposition: A | Discharge status: Home / Self Care | Payer: BLUE CROSS/BLUE SHIELD | Source: Ambulatory Visit | Attending: Neurology | Admitting: Neurology

## 2020-04-09 ENCOUNTER — Other Ambulatory Visit: Payer: Self-pay

## 2020-04-09 DIAGNOSIS — R519 Headache, unspecified: Secondary | ICD-10-CM

## 2020-04-09 DIAGNOSIS — R410 Disorientation, unspecified: Secondary | ICD-10-CM

## 2020-04-09 DIAGNOSIS — D329 Benign neoplasm of meninges, unspecified: Secondary | ICD-10-CM

## 2020-04-09 MED ORDER — GADOBENATE DIMEGLUMINE 529 MG/ML IV SOLN
20.0000 mL | Freq: Once | INTRAVENOUS | Status: AC | PRN
  Administered 2020-04-09: 20 mL via INTRAVENOUS

## 2020-05-12 DIAGNOSIS — H9191 Unspecified hearing loss, right ear: Secondary | ICD-10-CM | POA: Insufficient documentation

## 2020-05-12 DIAGNOSIS — I10 Essential (primary) hypertension: Secondary | ICD-10-CM | POA: Insufficient documentation

## 2020-05-12 DIAGNOSIS — S04012A Injury of optic nerve, left eye, initial encounter: Secondary | ICD-10-CM | POA: Insufficient documentation

## 2020-05-12 DIAGNOSIS — D649 Anemia, unspecified: Secondary | ICD-10-CM | POA: Insufficient documentation

## 2020-05-12 DIAGNOSIS — E119 Type 2 diabetes mellitus without complications: Secondary | ICD-10-CM | POA: Insufficient documentation

## 2020-05-12 DIAGNOSIS — Z9889 Other specified postprocedural states: Secondary | ICD-10-CM | POA: Insufficient documentation

## 2020-05-12 DIAGNOSIS — S04032A Injury of optic tract and pathways, left eye, initial encounter: Secondary | ICD-10-CM | POA: Insufficient documentation

## 2020-05-12 DIAGNOSIS — E785 Hyperlipidemia, unspecified: Secondary | ICD-10-CM | POA: Insufficient documentation

## 2020-05-12 DIAGNOSIS — M1990 Unspecified osteoarthritis, unspecified site: Secondary | ICD-10-CM | POA: Insufficient documentation

## 2020-05-12 DIAGNOSIS — E890 Postprocedural hypothyroidism: Secondary | ICD-10-CM | POA: Insufficient documentation

## 2020-05-12 DIAGNOSIS — C801 Malignant (primary) neoplasm, unspecified: Secondary | ICD-10-CM | POA: Insufficient documentation

## 2020-05-12 DIAGNOSIS — L719 Rosacea, unspecified: Secondary | ICD-10-CM | POA: Insufficient documentation

## 2020-05-12 DIAGNOSIS — F419 Anxiety disorder, unspecified: Secondary | ICD-10-CM | POA: Insufficient documentation

## 2020-05-12 DIAGNOSIS — K5130 Ulcerative (chronic) rectosigmoiditis without complications: Secondary | ICD-10-CM | POA: Insufficient documentation

## 2020-05-12 DIAGNOSIS — Z85850 Personal history of malignant neoplasm of thyroid: Secondary | ICD-10-CM | POA: Insufficient documentation

## 2020-05-12 DIAGNOSIS — M797 Fibromyalgia: Secondary | ICD-10-CM | POA: Insufficient documentation

## 2020-05-12 DIAGNOSIS — R0600 Dyspnea, unspecified: Secondary | ICD-10-CM | POA: Insufficient documentation

## 2020-05-12 DIAGNOSIS — K219 Gastro-esophageal reflux disease without esophagitis: Secondary | ICD-10-CM | POA: Insufficient documentation

## 2020-05-12 DIAGNOSIS — R011 Cardiac murmur, unspecified: Secondary | ICD-10-CM | POA: Insufficient documentation

## 2020-05-12 DIAGNOSIS — K589 Irritable bowel syndrome without diarrhea: Secondary | ICD-10-CM | POA: Insufficient documentation

## 2020-05-12 DIAGNOSIS — R112 Nausea with vomiting, unspecified: Secondary | ICD-10-CM | POA: Insufficient documentation

## 2020-05-12 DIAGNOSIS — M75102 Unspecified rotator cuff tear or rupture of left shoulder, not specified as traumatic: Secondary | ICD-10-CM | POA: Insufficient documentation

## 2020-05-12 DIAGNOSIS — Z86010 Personal history of colonic polyps: Secondary | ICD-10-CM | POA: Insufficient documentation

## 2020-05-12 DIAGNOSIS — F32A Depression, unspecified: Secondary | ICD-10-CM | POA: Insufficient documentation

## 2020-05-14 ENCOUNTER — Ambulatory Visit (INDEPENDENT_AMBULATORY_CARE_PROVIDER_SITE_OTHER): Payer: BC Managed Care – PPO | Admitting: Cardiology

## 2020-05-14 ENCOUNTER — Other Ambulatory Visit: Payer: Self-pay

## 2020-05-14 ENCOUNTER — Encounter: Payer: Self-pay | Admitting: Cardiology

## 2020-05-14 VITALS — BP 128/82 | HR 90 | Ht 67.0 in | Wt 228.0 lb

## 2020-05-14 DIAGNOSIS — I428 Other cardiomyopathies: Secondary | ICD-10-CM

## 2020-05-14 DIAGNOSIS — E782 Mixed hyperlipidemia: Secondary | ICD-10-CM | POA: Diagnosis not present

## 2020-05-14 DIAGNOSIS — I5042 Chronic combined systolic (congestive) and diastolic (congestive) heart failure: Secondary | ICD-10-CM | POA: Diagnosis not present

## 2020-05-14 DIAGNOSIS — I10 Essential (primary) hypertension: Secondary | ICD-10-CM

## 2020-05-14 NOTE — Patient Instructions (Signed)

## 2020-05-14 NOTE — Progress Notes (Signed)
Cardiology Office Note:    Date:  05/14/2020   ID:  Meagan Allen, DOB Jan 11, 1956, MRN [id number]  PCP:  Lennie Odor, PA  Cardiologist:  Jenne Campus, MD    Referring MD: Lennie Odor, [place]   No chief complaint on file. I need knee surgery  History of Present Illness:    Meagan Allen is a 64 y.o. female past medical history significant for nonischemic cardiomyopathy.  There was a time that her ejection was 30 to 35%, however latest ejection fraction 45%.  Again 2019 she had cardiac catheterization surprisingly show coronary arteries without significant obstructions.  She also got history of longstanding diabetes, hypertension, dyslipidemia.  Comes today to my office to be evaluated before knee surgery.  The reason why she is doing the surgery is the fact that her knee is unstable and she feels down because of that knee there is some pain but not much.  She says she is able to walk climb stairs with no difficulties in terms of chest tightness just knee hurting.  She does have 1 fly stairs at home and she is able to do it back and forth many times also able to walk on the flat ground. From congestive heart failure point review also seems to be compensated.  There is no shortness of breath no swelling of lower extremities no proximal nocturnal dyspnea  Past Medical History:  Diagnosis Date  . Anemia   . Anxiety   . Arthritis    osteo  . Cancer [facility])    thyroid cancer  . CHF (congestive heart failure) ([place])    diagnosed feb 2019  . Depression   . Dyspnea   . Expressive aphasia 03/15/2020  . Fibromyalgia   . GERD (gastroesophageal reflux disease)   . Headache disorder 03/15/2020  . Hearing loss of right ear   . Heart murmur   . High uric acid in 24 hour urine specimen 09/24/2019  . History of colon polyps   . History of thyroid cancer 2007;  2009   dx papillary thyroid cancer w/ mets to cervical lymph nodes  . Hyperlipidemia   . Hypertension   . IBS (irritable bowel  syndrome)   . Left rotator cuff tear   . Migraine   . Mood change 03/15/2020  . Nonischemic cardiomyopathy [facility]) cardiologist-  dr Edyth Gunnels   per cardiac cath 08-28-2017  moderate LV dysfunction w/ diffuse hypocontractility ,  ef 35-40%  . 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 PONV (postoperative nausea and vomiting)    last colonoscopy propfol bp dropped and vomiting  . Post-surgical hypothyroidism   . Primary localized osteoarthrosis of ankle and foot 02/13/2020  . Rosacea   . Thyroid cancer ([place]) 06/25/2006  . Type 2 diabetes mellitus ([place])    followed by pcp  . 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[place])     Past Surgical History:  Procedure Laterality Date  . CARDIOVASCULAR STRESS TEST  08-20-2017   dr Agustin Cree   High risk nuclear study w/ large irreverisible defect in the basal and mid inferoseptal, inferior, inferolateral walls and apical septal, inferior walls with small peri infarct ischemia in the apical anterior & lateral walls (consistant w/ prior MI )/  no ST segment deviation noted /  nuclear stress ef 36%/  recommended cardiac cath  . CARPOMETACARPAL ([place]) FUSION OF THUMB Bilateral 2003  . COLONOSCOPY WITH PROPOFOL N/A 05/06/2013   Procedure: COLONOSCOPY  WITH PROPOFOL;  Surgeon: Garlan Fair, MD;  Location: [facility];  Service: Endoscopy;  Laterality: N/A;  . D & C HYSTEROSCOPY /  RESECTION POLYP/ ROLLER BALL ABLATION  02-26-2004   dr Kathyrn Drown  [facility]  . DILATION AND CURETTAGE OF UTERUS  1984  . EXCISION MORTON'S NEUROMA Left 1996  . giant cell tumor  2011   Left ankle 2011  . JOINT REPLACEMENT    . KNEE ARTHROSCOPY  08-16-2010  dr Beola Cord  [facility];  05/ 2012   right x2 left x1  . LEFT HEART CATH AND CORONARY ANGIOGRAPHY N/A 08/28/2017   Procedure: LEFT HEART CATH AND CORONARY ANGIOGRAPHY;  Surgeon: Troy Sine, MD;  Location: [place] CV LAB;  Service: Cardiovascular;  Laterality: N/A;    large normal coronary arteries in a dominant RCA system;  moderate LV systoic dysfunction w/ diffuse hypocontractility (compatible w/ nonischemic cardiomyopathy), LV end diastoilc pressure normal, LVEF 35-45% by visual estimate  . NASAL LACRIMAL DUCT SURGERY  06/14/2009  . REDO RIGHT MODIFIED RADICAL NECK DISSECTION  08-29-2007    [facility]  . SHOULDER ARTHROSCOPY WITH ROTATOR CUFF REPAIR AND SUBACROMIAL DECOMPRESSION Left 10/17/2017   Procedure: Left shoulder mini open rotator cuff repair, subacromial decompression;  Surgeon: [name], MD;  Location: [facility];  Service: Orthopedics;  Laterality: Left;  Interscalene Block  . SHOULDER SURGERY     Left mini- open rotator cuff repair Dr. Tonita Cong 10-17-17  . TOTAL KNEE ARTHROPLASTY Right 06/03/2018   Procedure: RIGHT TOTAL KNEE ARTHROPLASTY;  Surgeon: Vickey Huger, MD;  Location: [facility];  Service: Orthopedics;  Laterality: Right;  . TOTAL THYROIDECTOMY  2007  -- [facility]   w/ dissection lymph nodes  . TRANSTHORACIC ECHOCARDIOGRAM  08-20-2017  dr Agustin Cree   ef 25-30%, severe diffuse hypokinesis with no identifiable regional variations, but with profound dyssynchrony, due to arrhythmia insuffient to evaluate LV diastolic dysfunction/  trivial MR/  mild LAE/ Ventricular septum motion abnormal funtion,dyssynergy, & paradox  . WRIST SURGERY Right 2001    Current Medications: Current Meds  Medication Sig  . aspirin EC 81 MG tablet Take 81 mg by mouth daily.  Marland Kitchen buPROPion (WELLBUTRIN XL) 150 MG 24 hr tablet Take 450 mg by mouth every morning.   . Calcium-Magnesium-Vitamin D (CALCIUM 1200+D3 PO) Take by mouth 2 (two) times daily.   . cetirizine (ZYRTEC) 10 MG tablet Take 10 mg by mouth daily.  . chlorhexidine (HIBICLENS) 4 % external liquid Hibiclens 4 % topical liquid  BATH SKIN FOLDS WITH THIS ONCE A WEEK. AVOID EYES AND EARS.  Marland Kitchen Cholecalciferol (VITAMIN D) 50 MCG (2000 UT) CAPS Take 1 capsule by mouth daily.  . cinacalcet (SENSIPAR) 30 MG tablet Take 30 mg by  mouth daily.  . clindamycin (CLEOCIN T) 1 % external solution APPLY TO AFFECTED AREAS ON BODY DAILY (SKIN FOLDS  GROIN, UNDER BREASTS)  . clonazePAM (KLONOPIN) 0.5 MG tablet Take 0.5 mg by mouth at bedtime.   . CONTOUR NEXT TEST test strip USE TO CHECK BLOOD SUGAR 4 TIMES DAILY  . diclofenac Sodium (VOLTAREN) 1 % GEL diclofenac 1 % topical gel  APPLY 2 G TO KNEES ANKLES AS NEEDED TWICE A DAY   NO MORE THAN 24 G IN THE DAY TOTAL.  Marland Kitchen doxycycline (VIBRAMYCIN) 100 MG capsule 100 mg as directed.   Marland Kitchen ENTRESTO 49-51 MG TAKE 1 TABLET BY MOUTH TWICE A DAY  . furosemide (LASIX) 20 MG tablet TAKE 1 TABLET BY MOUTH EVERY DAY  . Insulin Glargine (LANTUS  SOLOSTAR) 100 UNIT/ML Solostar Pen Inject 80 Units into the skin daily at 10 pm.   . levothyroxine (SYNTHROID, LEVOTHROID) 137 MCG tablet Take 137 mcg by mouth at bedtime.   . mesalamine (LIALDA) 1.2 g EC tablet Take 2.4 g by mouth 2 (two) times daily.  . metFORMIN (GLUCOPHAGE-XR) 500 MG 24 hr tablet Take 1,000 mg by mouth every evening.  . methocarbamol (ROBAXIN) 500 MG tablet methocarbamol 500 mg tablet  TAKE 1 TABLET TWO TIMES A DAY FOR FIBROMYALGIA  . metoprolol succinate (TOPROL-XL) 100 MG 24 hr tablet TAKE 1 TABLET BY MOUTH DAILY WITH OR IMMEDIATELY FOLLOWING A MEAL.  Marland Kitchen Misc Natural Products (COSAMIN ASU ADVANCED FORMULA) CAPS Take 2 capsules by mouth daily.   Marland Kitchen nystatin (MYCOSTATIN/NYSTOP) powder nystatin 100,000 unit/gram topical powder  APPLY TO SKIN FOLDS DAILY FOR YEAST.  Marland Kitchen OZEMPIC, 0.25 OR 0.5 MG/DOSE, 2 MG/1.5ML SOPN Inject 0.5 mg into the skin once a week.  . pantoprazole (PROTONIX) 40 MG tablet Take 40 mg by mouth daily.  . polyethylene glycol powder (GLYCOLAX/MIRALAX) powder Take 17 g by mouth daily as needed (for constipation.).   Marland Kitchen rosuvastatin (CRESTOR) 5 MG tablet Take 5 mg by mouth 3 (three) times a week.     Allergies:   Hydroxychloroquine, Sulfa antibiotics, Bydureon [exenatide], Invokana [canagliflozin], Jardiance [empagliflozin],  Lisinopril, Pravastatin, and Propofol   Social History   Socioeconomic History  . Marital status: Married    Spouse name: Jeneen Rinks  . Number of children: 2  . Years of education: 15  . Highest education level: Not on file  Occupational History  . Occupation: Banker for [employer]: [employer]  Tobacco Use  . Smoking status: Former Smoker    Packs/day: 3.00    Years: 10.00    Pack years: 30.00    Types: Cigarettes    Quit date: 07/17/1980    Years since quitting: 39.8  . Smokeless tobacco: Never Used  Vaping Use  . Vaping Use: Never used  Substance and Sexual Activity  . Alcohol use: No  . Drug use: No  . Sexual activity: Yes    Birth control/protection: Post-menopausal  Other Topics Concern  . Not on file  Social History Narrative   Wendolyn was born in [place], [place]. She moved to [place] in 1978 when her family moved to this state. Takela currently lives in [place] with her husband of 36 years. They have 2 adult children and 1 grandson. 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   Social Determinants of Health   Financial Resource Strain:   . Difficulty of Paying Living Expenses: Not on file  Food Insecurity:   . Worried About Charity fundraiser in the Last Year: Not on file  . Ran Out of Food in the Last Year: Not on file  Transportation Needs:   . Lack of Transportation (Medical): Not on file  . Lack of Transportation (Non-Medical): Not on file  Physical Activity:   . Days of Exercise per Week: Not on file  . Minutes of Exercise per Session: Not on file  Stress:   . Feeling of Stress : Not on file  Social Connections:   . Frequency of Communication with Friends and Family: Not on file  . Frequency of Social Gatherings with Friends and Family: Not on file  . Attends Religious Services: Not on file  . Active Member of Clubs or Organizations: Not on file  .  Attends Archivist Meetings:  Not on file  . Marital Status: Not on file     Family History: The patient's family history includes Arthritis in her mother; Asthma in her son; Cancer in her brother; Depression in her mother; Diabetes in her father; Gout in her brother, brother, and brother; Heart disease in her father and sister; Hypertension in her father; Stroke in her mother. There is no history of Breast cancer. ROS:   Please see the history of present illness.    All 14 point review of systems negative except as described per history of present illness  EKGs/Labs/Other Studies Reviewed:      Recent Labs: 06/06/2019: BUN 16; Creatinine, Ser 1.02; Potassium 4.4; Sodium 143  Recent Lipid Panel    Component Value Date/Time   CHOL 192 03/27/2018 0937   TRIG 183 (H) 03/27/2018 0937   HDL 41 03/27/2018 0937   CHOLHDL 4.7 (H) 03/27/2018 0937   LDLCALC 114 (H) 03/27/2018 0937    Physical Exam:    VS:  BP 128/82   Pulse 90   Ht 5' 7"  (1.702 m)   Wt 228 lb (103.4 kg)   SpO2 96%   BMI 35.71 kg/m     Wt Readings from Last 3 Encounters:  05/14/20 228 lb (103.4 kg)  01/21/20 225 lb (102.1 kg)  10/07/19 228 lb 1.9 oz (103.5 kg)     GEN:  Well nourished, well developed in no acute distress HEENT: Normal NECK: No JVD; No carotid bruits LYMPHATICS: No lymphadenopathy CARDIAC: RRR, no murmurs, no rubs, no gallops RESPIRATORY:  Clear to auscultation without rales, wheezing or rhonchi  ABDOMEN: Soft, non-tender, non-distended MUSCULOSKELETAL:  No edema; No deformity  SKIN: Warm and dry LOWER EXTREMITIES: no swelling NEUROLOGIC:  Alert and oriented x 3 PSYCHIATRIC:  Normal affect   ASSESSMENT:    1. Nonischemic cardiomyopathy ([place])   2. Primary hypertension   3. Chronic combined systolic and diastolic congestive heart failure ([place])   4. Mixed hyperlipidemia    PLAN:    In order of problems listed above:  1. Nonischemic cardiomyopathy latest ejection fraction 4045%.  This is from 4 months ago.  She  is on appropriate medications she is on beta-blocker in form of metoprolol succinate 100 mg daily as well as Entresto 49-51.  We will continue with those medications.  Hemodynamically she is compensated. 2. Primary essential hypertension.  Blood pressure well controlled today continue present management. 3. Coronary artery disease: Last evaluation in February 2019 showing normal coronaries.  No signs and symptoms of coronary artery diseas. 4. Cardiovascular preop evaluation.  She is scheduled to have elective left knee replacement surgery.  I do not see contraindication from cardiac standpoint of view to do that.  She does have diminished left ventricle ejection fraction therefore special attention need to be paid to fluids and hemodynamic monitoring.  She needs to maintain her medications which include beta-blocker as well as Entresto.   Medication Adjustments/Labs and Tests Ordered: Current medicines are reviewed at length with the patient today.  Concerns regarding medicines are outlined above.  No orders of the defined types were placed in this encounter.  Medication changes: No orders of the defined types were placed in this encounter.   Signed, Park Liter, MD, [facility] 05/14/2020 2:20 PM    [place]

## 2020-05-17 HISTORY — PX: JOINT REPLACEMENT: SHX530

## 2020-06-24 ENCOUNTER — Other Ambulatory Visit
Admission: RE | Admit: 2020-06-24 | Discharge: 2020-06-24 | Disposition: A | Discharge status: Home / Self Care | Payer: BC Managed Care – PPO | Source: Ambulatory Visit | Attending: Family Medicine | Admitting: Family Medicine

## 2020-06-24 ENCOUNTER — Other Ambulatory Visit: Payer: Self-pay

## 2020-06-24 ENCOUNTER — Other Ambulatory Visit: Payer: Self-pay | Admitting: Family Medicine

## 2020-06-24 ENCOUNTER — Other Ambulatory Visit (HOSPITAL_COMMUNITY): Payer: Self-pay | Admitting: Family Medicine

## 2020-06-24 ENCOUNTER — Ambulatory Visit
Admission: RE | Admit: 2020-06-24 | Discharge: 2020-06-24 | Disposition: A | Discharge status: Home / Self Care | Payer: BC Managed Care – PPO | Source: Ambulatory Visit | Attending: Family Medicine | Admitting: Family Medicine

## 2020-06-24 DIAGNOSIS — R110 Nausea: Secondary | ICD-10-CM | POA: Insufficient documentation

## 2020-06-24 DIAGNOSIS — R000 Tachycardia, unspecified: Secondary | ICD-10-CM | POA: Insufficient documentation

## 2020-06-24 DIAGNOSIS — R42 Dizziness and giddiness: Secondary | ICD-10-CM

## 2020-06-24 LAB — TROPONIN I (HIGH SENSITIVITY): Troponin I (High Sensitivity): 4 ng/L (ref <18)

## 2020-06-24 LAB — POCT I-STAT CREATININE: Creatinine, Ser: 1 mg/dL (ref 0.44–1.00)

## 2020-06-24 MED ORDER — IOHEXOL 350 MG/ML SOLN
75.0000 mL | Freq: Once | INTRAVENOUS | Status: AC | PRN
  Administered 2020-06-24: 75 mL via INTRAVENOUS

## 2020-07-05 ENCOUNTER — Other Ambulatory Visit: Payer: Self-pay | Admitting: Infectious Diseases

## 2020-07-05 DIAGNOSIS — R1011 Right upper quadrant pain: Secondary | ICD-10-CM

## 2020-07-12 ENCOUNTER — Other Ambulatory Visit: Payer: Self-pay

## 2020-07-12 ENCOUNTER — Ambulatory Visit
Admission: RE | Admit: 2020-07-12 | Discharge: 2020-07-12 | Disposition: A | Discharge status: Home / Self Care | Payer: BC Managed Care – PPO | Source: Ambulatory Visit | Attending: Infectious Diseases | Admitting: Infectious Diseases

## 2020-07-12 DIAGNOSIS — R1011 Right upper quadrant pain: Secondary | ICD-10-CM | POA: Diagnosis present

## 2020-07-13 ENCOUNTER — Other Ambulatory Visit (HOSPITAL_COMMUNITY): Payer: Self-pay | Admitting: Infectious Diseases

## 2020-07-13 ENCOUNTER — Other Ambulatory Visit: Payer: Self-pay | Admitting: Infectious Diseases

## 2020-07-13 DIAGNOSIS — E039 Hypothyroidism, unspecified: Secondary | ICD-10-CM

## 2020-07-13 DIAGNOSIS — Z92241 Personal history of systemic steroid therapy: Secondary | ICD-10-CM | POA: Insufficient documentation

## 2020-07-13 DIAGNOSIS — E1165 Type 2 diabetes mellitus with hyperglycemia: Secondary | ICD-10-CM

## 2020-07-13 DIAGNOSIS — D509 Iron deficiency anemia, unspecified: Secondary | ICD-10-CM

## 2020-07-13 DIAGNOSIS — K7689 Other specified diseases of liver: Secondary | ICD-10-CM

## 2020-07-13 DIAGNOSIS — E1142 Type 2 diabetes mellitus with diabetic polyneuropathy: Secondary | ICD-10-CM

## 2020-07-13 DIAGNOSIS — N1831 Chronic kidney disease, stage 3a: Secondary | ICD-10-CM

## 2020-07-13 HISTORY — DX: Iron deficiency anemia, unspecified: D50.9

## 2020-07-13 HISTORY — DX: Personal history of systemic steroid therapy: Z92.241

## 2020-07-13 HISTORY — DX: Type 2 diabetes mellitus with hyperglycemia: E11.65

## 2020-07-13 HISTORY — DX: Morbid (severe) obesity due to excess calories: E66.01

## 2020-07-13 HISTORY — DX: Hypercalcemia: E83.52

## 2020-07-13 HISTORY — DX: Type 2 diabetes mellitus with diabetic polyneuropathy: E11.42

## 2020-07-13 HISTORY — DX: Chronic kidney disease, stage 3a: N18.31

## 2020-07-13 HISTORY — DX: Hypothyroidism, unspecified: E03.9

## 2020-07-15 ENCOUNTER — Other Ambulatory Visit: Payer: Self-pay

## 2020-07-15 ENCOUNTER — Ambulatory Visit
Admission: RE | Admit: 2020-07-15 | Discharge: 2020-07-15 | Disposition: A | Discharge status: Home / Self Care | Payer: BC Managed Care – PPO | Source: Ambulatory Visit | Attending: Infectious Diseases | Admitting: Infectious Diseases

## 2020-07-15 DIAGNOSIS — K7689 Other specified diseases of liver: Secondary | ICD-10-CM | POA: Diagnosis not present

## 2020-07-15 MED ORDER — GADOBUTROL 1 MMOL/ML IV SOLN
10.0000 mL | Freq: Once | INTRAVENOUS | Status: AC | PRN
  Administered 2020-07-15: 10 mL via INTRAVENOUS

## 2020-07-19 DIAGNOSIS — R1310 Dysphagia, unspecified: Secondary | ICD-10-CM | POA: Insufficient documentation

## 2020-07-19 DIAGNOSIS — K769 Liver disease, unspecified: Secondary | ICD-10-CM

## 2020-07-19 DIAGNOSIS — K2100 Gastro-esophageal reflux disease with esophagitis, without bleeding: Secondary | ICD-10-CM

## 2020-07-19 HISTORY — DX: Gastro-esophageal reflux disease with esophagitis, without bleeding: K21.00

## 2020-07-19 HISTORY — DX: Dysphagia, unspecified: R13.10

## 2020-07-19 HISTORY — DX: Liver disease, unspecified: K76.9

## 2020-09-24 ENCOUNTER — Other Ambulatory Visit: Payer: Self-pay

## 2020-09-27 DIAGNOSIS — G629 Polyneuropathy, unspecified: Secondary | ICD-10-CM | POA: Insufficient documentation

## 2020-09-27 DIAGNOSIS — R278 Other lack of coordination: Secondary | ICD-10-CM | POA: Insufficient documentation

## 2020-09-29 ENCOUNTER — Encounter: Payer: Self-pay | Admitting: Cardiology

## 2020-09-29 ENCOUNTER — Other Ambulatory Visit: Payer: Self-pay

## 2020-09-29 ENCOUNTER — Ambulatory Visit (INDEPENDENT_AMBULATORY_CARE_PROVIDER_SITE_OTHER): Payer: BC Managed Care – PPO | Admitting: Cardiology

## 2020-09-29 VITALS — BP 114/70 | HR 75 | Ht 67.0 in | Wt 226.0 lb

## 2020-09-29 DIAGNOSIS — I5042 Chronic combined systolic (congestive) and diastolic (congestive) heart failure: Secondary | ICD-10-CM | POA: Diagnosis not present

## 2020-09-29 DIAGNOSIS — R160 Hepatomegaly, not elsewhere classified: Secondary | ICD-10-CM | POA: Diagnosis not present

## 2020-09-29 DIAGNOSIS — I428 Other cardiomyopathies: Secondary | ICD-10-CM

## 2020-09-29 DIAGNOSIS — I10 Essential (primary) hypertension: Secondary | ICD-10-CM | POA: Diagnosis not present

## 2020-09-29 DIAGNOSIS — E782 Mixed hyperlipidemia: Secondary | ICD-10-CM

## 2020-09-29 NOTE — Patient Instructions (Signed)

## 2020-09-29 NOTE — Progress Notes (Signed)
Cardiology Office Note:    Date:  09/29/2020   ID:  Meagan Allen, DOB 14-Jan-1956, MRN [id number]  PCP:  Leonel Ramsay, MD  Cardiologist:  Jenne Campus, MD    Referring MD: Lennie Odor, [place]   Chief Complaint  Patient presents with  . Dizziness    History of Present Illness:    Meagan Allen is a 65 y.o. female with past medical history significant for cardiomyopathy initially ejection fraction 30 to 35% however appropriate management led to improvement left ventricle ejection fraction to only mildly diminished now.  Cardiac catheterization done few years ago showed no evidence of significant coronary artery disease.  Her past medical history is also significant for essential hypertension, diabetes.  Recently she had knee replacement surgery done did quite well and doing overall very well after that.  Denies have any chest pain tightness squeezing pressure burning chest.  She described to have episode of dizziness when she was getting up very quickly that happened when she was doing exercises suspected post orthostasis.  She only had one episode of it.  Past Medical History:  Diagnosis Date  . Anemia   . Anxiety   . Arthritis    osteo  . Cancer [facility])    thyroid cancer  . CHF (congestive heart failure) ([place])    diagnosed feb 2019  . Chronic kidney disease, stage 3a ([place]) 07/13/2020  . Depression   . Dysphagia 07/19/2020  . Dyspnea   . Enlarged liver   . Expressive aphasia 03/15/2020  . Fibromyalgia   . Gastroesophageal reflux disease with esophagitis without hemorrhage 07/19/2020  . GERD (gastroesophageal reflux disease)   . Headache disorder 03/15/2020  . Hearing loss of right ear   . Heart murmur   . High uric acid in 24 hour urine specimen 09/24/2019  . History of colon polyps   . History of corticosteroid therapy 07/13/2020  . History of thyroid cancer 2007;  2009   dx papillary thyroid cancer w/ mets to cervical lymph nodes  . HTN (hypertension) 03/25/2012  .  Hypercalcemia 07/13/2020  . Hyperglycemia due to type 2 diabetes mellitus ([place]) 07/13/2020  . Hyperlipidemia   . Hypertension   . Hypothyroidism 07/13/2020  . IBS (irritable bowel syndrome)   . Iron deficiency anemia 07/13/2020  . Left rotator cuff tear   . Lesion of liver 07/19/2020  . Migraine   . Mood change 03/15/2020  . Morbid obesity ([place]) 07/13/2020  . Nonischemic cardiomyopathy [facility]) cardiologist-  dr Edyth Gunnels   per cardiac cath 08-28-2017  moderate LV dysfunction w/ diffuse hypocontractility ,  ef 35-40%  . 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 Polyneuropathy due to type 2 diabetes mellitus ([place]) 07/13/2020  . PONV (postoperative nausea and vomiting)    last colonoscopy propfol bp dropped and vomiting  . Post-surgical hypothyroidism   . Primary localized osteoarthrosis of ankle and foot 02/13/2020  . Rosacea   . Thyroid cancer ([place]) 06/25/2006  . Type 2 diabetes mellitus ([place])    followed by pcp  . 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[place])     Past Surgical History:  Procedure Laterality Date  . CARDIOVASCULAR STRESS TEST  08-20-2017   dr Agustin Cree   High risk nuclear study w/ large irreverisible defect in the basal and mid inferoseptal, inferior, inferolateral walls and apical septal, inferior walls with small peri infarct ischemia in the apical anterior & lateral walls (consistant w/  prior MI )/  no ST segment deviation noted /  nuclear stress ef 36%/  recommended cardiac cath  . CARPOMETACARPAL ([place]) FUSION OF THUMB Bilateral 2003  . COLONOSCOPY WITH PROPOFOL N/A 05/06/2013   Procedure: COLONOSCOPY WITH PROPOFOL;  Surgeon: Garlan Fair, MD;  Location: [facility];  Service: Endoscopy;  Laterality: N/A;  . D & C HYSTEROSCOPY /  RESECTION POLYP/ ROLLER BALL ABLATION  02-26-2004   dr Kathyrn Drown  [facility]  . DILATION AND CURETTAGE OF UTERUS  1984  . EXCISION MORTON'S NEUROMA Left 1996  . giant cell  tumor  2011   Left ankle 2011  . JOINT REPLACEMENT    . KNEE ARTHROSCOPY  08-16-2010  dr Beola Cord  [facility];  05/ 2012   right x2 left x1  . LEFT HEART CATH AND CORONARY ANGIOGRAPHY N/A 08/28/2017   Procedure: LEFT HEART CATH AND CORONARY ANGIOGRAPHY;  Surgeon: Troy Sine, MD;  Location: [facility];  Service: Cardiovascular;  Laterality: N/A;   large normal coronary arteries in a dominant RCA system;  moderate LV systoic dysfunction w/ diffuse hypocontractility (compatible w/ nonischemic cardiomyopathy), LV end diastoilc pressure normal, LVEF 35-45% by visual estimate  . Left knee replaced   05/2010  . NASAL LACRIMAL DUCT SURGERY  06/14/2009  . REDO RIGHT MODIFIED RADICAL NECK DISSECTION  08-29-2007    [facility]  . SHOULDER ARTHROSCOPY WITH ROTATOR CUFF REPAIR AND SUBACROMIAL DECOMPRESSION Left 10/17/2017   Procedure: Left shoulder mini open rotator cuff repair, subacromial decompression;  Surgeon: [name], MD;  Location: [facility];  Service: Orthopedics;  Laterality: Left;  Interscalene Block  . SHOULDER SURGERY     Left mini- open rotator cuff repair Dr. Tonita Cong 10-17-17  . TOTAL KNEE ARTHROPLASTY Right 06/03/2018   Procedure: RIGHT TOTAL KNEE ARTHROPLASTY;  Surgeon: Vickey Huger, MD;  Location: [facility];  Service: Orthopedics;  Laterality: Right;  . TOTAL THYROIDECTOMY  2007  -- [facility]   w/ dissection lymph nodes  . TRANSTHORACIC ECHOCARDIOGRAM  08-20-2017  dr Agustin Cree   ef 25-30%, severe diffuse hypokinesis with no identifiable regional variations, but with profound dyssynchrony, due to arrhythmia insuffient to evaluate LV diastolic dysfunction/  trivial MR/  mild LAE/ Ventricular septum motion abnormal funtion,dyssynergy, & paradox  . WRIST SURGERY Right 2001    Current Medications: Current Meds  Medication Sig  . allopurinol (ZYLOPRIM) 300 MG tablet Take 300 mg by mouth daily.  Marland Kitchen aspirin EC 81 MG tablet Take 81 mg by mouth daily.  Marland Kitchen buPROPion (WELLBUTRIN XL) 150 MG 24 hr tablet Take  450 mg by mouth every morning.   . Calcium-Magnesium-Vitamin D (CALCIUM 1200+D3 PO) Take by mouth 2 (two) times daily.   . cetirizine (ZYRTEC) 10 MG tablet Take 10 mg by mouth daily.  . chlorhexidine (HIBICLENS) 4 % external liquid Hibiclens 4 % topical liquid  BATH SKIN FOLDS WITH THIS ONCE A WEEK. AVOID EYES AND EARS.  Marland Kitchen Cholecalciferol (VITAMIN D) 50 MCG (2000 UT) CAPS Take 1 capsule by mouth daily.  . cinacalcet (SENSIPAR) 30 MG tablet Take 30 mg by mouth daily.  . clindamycin (CLEOCIN T) 1 % external solution APPLY TO AFFECTED AREAS ON BODY DAILY (SKIN FOLDS  GROIN, UNDER BREASTS)  . clonazePAM (KLONOPIN) 0.5 MG tablet Take 0.5 mg by mouth at bedtime.  . CONTOUR NEXT TEST test strip USE TO CHECK BLOOD SUGAR 4 TIMES DAILY  . diclofenac Sodium (VOLTAREN) 1 % GEL diclofenac 1 % topical gel  APPLY 2 G TO KNEES ANKLES AS  NEEDED TWICE A DAY   NO MORE THAN 24 G IN THE DAY TOTAL.  Marland Kitchen doxycycline (VIBRAMYCIN) 100 MG capsule 100 mg as directed.   Marland Kitchen ENTRESTO 49-51 MG TAKE 1 TABLET BY MOUTH TWICE A DAY  . furosemide (LASIX) 20 MG tablet TAKE 1 TABLET BY MOUTH EVERY DAY  . insulin glargine (LANTUS) 100 UNIT/ML Solostar Pen Inject 80 Units into the skin daily at 10 pm.   . levothyroxine (SYNTHROID, LEVOTHROID) 137 MCG tablet Take 137 mcg by mouth at bedtime.   . liraglutide (VICTOZA) 18 MG/3ML SOPN Inject 0.3 mLs into the skin daily.  . mesalamine (LIALDA) 1.2 g EC tablet Take 2.4 g by mouth 2 (two) times daily.  . metFORMIN (GLUCOPHAGE-XR) 500 MG 24 hr tablet Take 1,000 mg by mouth every evening.  . methocarbamol (ROBAXIN) 500 MG tablet methocarbamol 500 mg tablet  TAKE 1 TABLET TWO TIMES A DAY FOR FIBROMYALGIA  . metoprolol succinate (TOPROL-XL) 100 MG 24 hr tablet TAKE 1 TABLET BY MOUTH DAILY WITH OR IMMEDIATELY FOLLOWING A MEAL.  Marland Kitchen Misc Natural Products (COSAMIN ASU ADVANCED FORMULA) CAPS Take 1 tablet by mouth daily.  Marland Kitchen MITIGARE 0.6 MG CAPS Take 1 capsule by mouth daily.  Marland Kitchen nystatin  (MYCOSTATIN/NYSTOP) powder nystatin 100,000 unit/gram topical powder  APPLY TO SKIN FOLDS DAILY FOR YEAST.  . pantoprazole (PROTONIX) 40 MG tablet Take 40 mg by mouth daily.  . polyethylene glycol powder (GLYCOLAX/MIRALAX) powder Take 17 g by mouth daily as needed (for constipation.).   Marland Kitchen rosuvastatin (CRESTOR) 5 MG tablet Take 5 mg by mouth 3 (three) times a week.     Allergies:   Hydroxychloroquine, Sulfa antibiotics, Bydureon [exenatide], Gabapentin, Invokana [canagliflozin], Jardiance [empagliflozin], Lisinopril, Methotrexate, Pravastatin, and Propofol   Social History   Socioeconomic History  . Marital status: Married    Spouse name: Jeneen Rinks  . Number of children: 2  . Years of education: 7  . Highest education level: Not on file  Occupational History  . Occupation: Banker for [employer]: [employer]  Tobacco Use  . Smoking status: Former Smoker    Packs/day: 3.00    Years: 10.00    Pack years: 30.00    Types: Cigarettes    Quit date: 07/17/1980    Years since quitting: 40.2  . Smokeless tobacco: Never Used  Vaping Use  . Vaping Use: Never used  Substance and Sexual Activity  . Alcohol use: No  . Drug use: No  . Sexual activity: Yes    Birth control/protection: Post-menopausal  Other Topics Concern  . Not on file  Social History Narrative   Meagan Allen was born in [place], [place]. She moved to [place] in 1978 when her family moved to this state. Malkia currently lives in [place] with her husband of 64 years. They have 2 adult children and 1 grandson. 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   Social Determinants of Health   Financial Resource Strain: Not on file  Food Insecurity: Not on file  Transportation Needs: Not on file  Physical Activity: Not on file  Stress: Not on file  Social Connections: Not on file     Family History: The patient's family history includes Arthritis in her mother;  Asthma in her son; Cancer in her brother; Depression in her mother; Diabetes in her father; Gout in her brother, brother, and brother; Heart disease in her father and sister; Hypertension in her father; Stroke in her  mother. There is no history of Breast cancer. ROS:   Please see the history of present illness.    All 14 point review of systems negative except as described per history of present illness  EKGs/Labs/Other Studies Reviewed:      Recent Labs: 06/24/2020: Creatinine, Ser 1.00  Recent Lipid Panel    Component Value Date/Time   CHOL 192 03/27/2018 0937   TRIG 183 (H) 03/27/2018 0937   HDL 41 03/27/2018 0937   CHOLHDL 4.7 (H) 03/27/2018 0937   LDLCALC 114 (H) 03/27/2018 0937    Physical Exam:    VS:  BP 114/70 (BP Location: Right Arm, Patient Position: Sitting)   Pulse 75   Ht 5' 7"  (1.702 m)   Wt 226 lb (102.5 kg)   SpO2 94%   BMI 35.40 kg/m     Wt Readings from Last 3 Encounters:  09/29/20 226 lb (102.5 kg)  05/14/20 228 lb (103.4 kg)  01/21/20 225 lb (102.1 kg)     GEN:  Well nourished, well developed in no acute distress HEENT: Normal NECK: No JVD; No carotid bruits LYMPHATICS: No lymphadenopathy CARDIAC: RRR, no murmurs, no rubs, no gallops RESPIRATORY:  Clear to auscultation without rales, wheezing or rhonchi  ABDOMEN: Soft, non-tender, non-distended MUSCULOSKELETAL:  No edema; No deformity  SKIN: Warm and dry LOWER EXTREMITIES: no swelling NEUROLOGIC:  Alert and oriented x 3 PSYCHIATRIC:  Normal affect   ASSESSMENT:    1. Nonischemic cardiomyopathy (HCC)   2. Enlarged liver   3. Chronic combined systolic and diastolic congestive heart failure ([place])   4. Primary hypertension   5. Mixed hyperlipidemia    PLAN:    In order of problems listed above:  1. Coronary disease stable from that point review.  Nonobstructive disease based on cardiac catheterization. 2. He still cardiomyopathy on appropriate medications are continued.  Overall there is  improvement left ventricular ejection fraction. 3. Essential hypertension blood pressure well controlled continue present management. 4. Diabetes that being followed by entire medicine team.  Her hemoglobin A1c is 6.6 from last measurements. 5. Dyslipidemia LDL 77 HDL 48 we will continue present management.  She takes only very small dose of Crestor because of some abnormality of the liver.   Medication Adjustments/Labs and Tests Ordered: Current medicines are reviewed at length with the patient today.  Concerns regarding medicines are outlined above.  No orders of the defined types were placed in this encounter.  Medication changes: No orders of the defined types were placed in this encounter.   Signed, Park Liter, MD, [facility] 09/29/2020 3:54 PM    [place]

## 2020-10-01 ENCOUNTER — Other Ambulatory Visit (HOSPITAL_COMMUNITY): Payer: Self-pay | Admitting: *Deleted

## 2020-10-04 ENCOUNTER — Other Ambulatory Visit: Payer: Self-pay

## 2020-10-04 ENCOUNTER — Encounter (HOSPITAL_COMMUNITY)
Admission: RE | Admit: 2020-10-04 | Discharge: 2020-10-04 | Disposition: A | Discharge status: Home / Self Care | Payer: BC Managed Care – PPO | Source: Ambulatory Visit | Attending: Gastroenterology | Admitting: Gastroenterology

## 2020-10-04 DIAGNOSIS — D509 Iron deficiency anemia, unspecified: Secondary | ICD-10-CM | POA: Insufficient documentation

## 2020-10-04 LAB — IRON AND TIBC
Iron: 25 ug/dL — ABNORMAL LOW (ref 28–170)
Saturation Ratios: 5 % — ABNORMAL LOW (ref 10.4–31.8)
TIBC: 501 ug/dL — ABNORMAL HIGH (ref 250–450)
UIBC: 476 ug/dL

## 2020-10-04 MED ORDER — SODIUM CHLORIDE 0.9 % IV SOLN
510.0000 mg | INTRAVENOUS | Status: DC
  Administered 2020-10-04: 510 mg via INTRAVENOUS
  Filled 2020-10-04: qty 510

## 2020-10-04 NOTE — Discharge Instructions (Signed)
Ferumoxytol injection What is this medicine? FERUMOXYTOL is an iron complex. Iron is used to make healthy red blood cells, which carry oxygen and nutrients throughout the body. This medicine is used to treat iron deficiency anemia. This medicine may be used for other purposes; ask your health care provider or pharmacist if you have questions. COMMON BRAND NAME(S): Feraheme What should I tell my health care provider before I take this medicine? They need to know if you have any of these conditions:  anemia not caused by low iron levels  high levels of iron in the blood  magnetic resonance imaging (MRI) test scheduled  an unusual or allergic reaction to iron, other medicines, foods, dyes, or preservatives  pregnant or trying to get pregnant  breast-feeding How should I use this medicine? This medicine is for injection into a vein. It is given by a health care professional in a hospital or clinic setting. Talk to your pediatrician regarding the use of this medicine in children. Special care may be needed. Overdosage: If you think you have taken too much of this medicine contact a poison control center or emergency room at once. NOTE: This medicine is only for you. Do not share this medicine with others. What if I miss a dose? It is important not to miss your dose. Call your doctor or health care professional if you are unable to keep an appointment. What may interact with this medicine? This medicine may interact with the following medications:  other iron products This list may not describe all possible interactions. Give your health care provider a list of all the medicines, herbs, non-prescription drugs, or dietary supplements you use. Also tell them if you smoke, drink alcohol, or use illegal drugs. Some items may interact with your medicine. What should I watch for while using this medicine? Visit your doctor or healthcare professional regularly. Tell your doctor or healthcare  professional if your symptoms do not start to get better or if they get worse. You may need blood work done while you are taking this medicine. You may need to follow a special diet. Talk to your doctor. Foods that contain iron include: whole grains/cereals, dried fruits, beans, or peas, leafy green vegetables, and organ meats (liver, kidney). What side effects may I notice from receiving this medicine? Side effects that you should report to your doctor or health care professional as soon as possible:  allergic reactions like skin rash, itching or hives, swelling of the face, lips, or tongue  breathing problems  changes in blood pressure  feeling faint or lightheaded, falls  fever or chills  flushing, sweating, or hot feelings  swelling of the ankles or feet Side effects that usually do not require medical attention (report to your doctor or health care professional if they continue or are bothersome):  diarrhea  headache  nausea, vomiting  stomach pain This list may not describe all possible side effects. Call your doctor for medical advice about side effects. You may report side effects to FDA at 1-800-FDA-1088. Where should I keep my medicine? This drug is given in a hospital or clinic and will not be stored at home. NOTE: This sheet is a summary. It may not cover all possible information. If you have questions about this medicine, talk to your doctor, pharmacist, or health care provider.  2021 Elsevier/Gold Standard (2016-08-21 20:21:10)  

## 2020-10-05 ENCOUNTER — Ambulatory Visit (INDEPENDENT_AMBULATORY_CARE_PROVIDER_SITE_OTHER): Payer: Self-pay | Admitting: Podiatry

## 2020-10-05 ENCOUNTER — Other Ambulatory Visit: Payer: Self-pay

## 2020-10-05 DIAGNOSIS — E1149 Type 2 diabetes mellitus with other diabetic neurological complication: Secondary | ICD-10-CM

## 2020-10-05 DIAGNOSIS — L989 Disorder of the skin and subcutaneous tissue, unspecified: Secondary | ICD-10-CM

## 2020-10-07 ENCOUNTER — Other Ambulatory Visit: Payer: Self-pay | Admitting: Cardiology

## 2020-10-08 NOTE — Telephone Encounter (Signed)
Entresto approved and sent

## 2020-10-09 NOTE — Progress Notes (Signed)
Subjective: 65 year old female presents the office today for evaluation of a painful callus on the plantar aspect of the right foot which is causing discomfort.  Denies any redness or drainage or any swelling.  Denies taking any foreign objects.  No recent injury.  No other concerns.  Objective: AAO x3, NAD DP/PT pulses palpable bilaterally, CRT less than 3 seconds On the plantar aspect of the right foot is a annular hyperkeratotic lesion.  Upon debridement there is no underlying ulceration drainage or any signs of infection.  No evidence of foreign body.  After debridement there was resolution of discomfort. No pain with calf compression, swelling, warmth, erythema  Assessment: Skin lesion right foot  Plan: -All treatment options discussed with the patient including all alternatives, risks, complications.  -Sharply debrided the hyperkeratotic lesion without any complications or bleeding.  Recommend moisturizer and offloading daily.  Return if symptoms worsen or fail to improve.  Trula Slade DPM

## 2020-10-11 ENCOUNTER — Other Ambulatory Visit: Payer: Self-pay

## 2020-10-11 ENCOUNTER — Encounter (HOSPITAL_COMMUNITY)
Admission: RE | Admit: 2020-10-11 | Discharge: 2020-10-11 | Disposition: A | Discharge status: Home / Self Care | Payer: BC Managed Care – PPO | Source: Ambulatory Visit | Attending: Gastroenterology | Admitting: Gastroenterology

## 2020-10-11 DIAGNOSIS — D509 Iron deficiency anemia, unspecified: Secondary | ICD-10-CM | POA: Diagnosis not present

## 2020-10-11 MED ORDER — SODIUM CHLORIDE 0.9 % IV SOLN
510.0000 mg | INTRAVENOUS | Status: DC
  Administered 2020-10-11: 510 mg via INTRAVENOUS
  Filled 2020-10-11: qty 510

## 2020-11-03 ENCOUNTER — Other Ambulatory Visit (HOSPITAL_COMMUNITY): Payer: Self-pay | Admitting: Physician Assistant

## 2020-11-03 DIAGNOSIS — R1011 Right upper quadrant pain: Secondary | ICD-10-CM

## 2020-11-18 ENCOUNTER — Other Ambulatory Visit: Payer: Self-pay

## 2020-11-18 ENCOUNTER — Ambulatory Visit (HOSPITAL_COMMUNITY)
Admission: RE | Admit: 2020-11-18 | Discharge: 2020-11-18 | Disposition: A | Discharge status: Home / Self Care | Payer: BC Managed Care – PPO | Source: Ambulatory Visit | Attending: Physician Assistant | Admitting: Physician Assistant

## 2020-11-18 DIAGNOSIS — R1011 Right upper quadrant pain: Secondary | ICD-10-CM | POA: Insufficient documentation

## 2020-11-18 MED ORDER — TECHNETIUM TC 99M MEBROFENIN IV KIT
5.1500 | PACK | Freq: Once | INTRAVENOUS | Status: AC | PRN
  Administered 2020-11-18: 5.15 via INTRAVENOUS

## 2020-11-28 ENCOUNTER — Other Ambulatory Visit: Payer: Self-pay | Admitting: Cardiology

## 2020-12-20 ENCOUNTER — Other Ambulatory Visit: Payer: Self-pay | Admitting: Gastroenterology

## 2020-12-20 DIAGNOSIS — I428 Other cardiomyopathies: Secondary | ICD-10-CM

## 2020-12-20 DIAGNOSIS — K5130 Ulcerative (chronic) rectosigmoiditis without complications: Secondary | ICD-10-CM

## 2020-12-20 DIAGNOSIS — K625 Hemorrhage of anus and rectum: Secondary | ICD-10-CM

## 2020-12-22 ENCOUNTER — Telehealth: Payer: Self-pay | Admitting: Cardiology

## 2020-12-22 DIAGNOSIS — I428 Other cardiomyopathies: Secondary | ICD-10-CM

## 2020-12-22 NOTE — Telephone Encounter (Signed)
Echo ordered. Messaged scheduler to get scheduled.

## 2020-12-22 NOTE — Telephone Encounter (Signed)
Pt is calling back in regards to scheduling a Echo. Please advise

## 2021-01-04 ENCOUNTER — Other Ambulatory Visit: Payer: BC Managed Care – PPO

## 2021-01-10 ENCOUNTER — Ambulatory Visit
Admission: RE | Admit: 2021-01-10 | Discharge: 2021-01-10 | Disposition: A | Discharge status: Home / Self Care | Payer: BC Managed Care – PPO | Source: Ambulatory Visit | Attending: Gastroenterology | Admitting: Gastroenterology

## 2021-01-10 ENCOUNTER — Other Ambulatory Visit: Payer: Self-pay

## 2021-01-10 DIAGNOSIS — K625 Hemorrhage of anus and rectum: Secondary | ICD-10-CM

## 2021-01-10 DIAGNOSIS — K5130 Ulcerative (chronic) rectosigmoiditis without complications: Secondary | ICD-10-CM

## 2021-01-10 MED ORDER — IOPAMIDOL (ISOVUE-300) INJECTION 61%
100.0000 mL | Freq: Once | INTRAVENOUS | Status: AC | PRN
  Administered 2021-01-10: 100 mL via INTRAVENOUS

## 2021-01-19 ENCOUNTER — Ambulatory Visit (HOSPITAL_BASED_OUTPATIENT_CLINIC_OR_DEPARTMENT_OTHER)
Admission: RE | Admit: 2021-01-19 | Discharge: 2021-01-19 | Disposition: A | Discharge status: Home / Self Care | Payer: BC Managed Care – PPO | Source: Ambulatory Visit | Attending: Cardiology | Admitting: Cardiology

## 2021-01-19 ENCOUNTER — Other Ambulatory Visit: Payer: Self-pay

## 2021-01-19 ENCOUNTER — Ambulatory Visit (HOSPITAL_BASED_OUTPATIENT_CLINIC_OR_DEPARTMENT_OTHER): Payer: Self-pay

## 2021-01-19 DIAGNOSIS — I428 Other cardiomyopathies: Secondary | ICD-10-CM | POA: Diagnosis not present

## 2021-01-19 LAB — ECHOCARDIOGRAM COMPLETE
AR max vel: 2.67 cm2
AV Area VTI: 2.89 cm2
AV Area mean vel: 2.85 cm2
AV Mean grad: 4 mmHg
AV Peak grad: 7.6 mmHg
Ao pk vel: 1.38 m/s
Area-P 1/2: 5.31 cm2
Calc EF: 47.7 %
MV VTI: 3.26 cm2
S' Lateral: 3.14 cm
Single Plane A2C EF: 34.4 %
Single Plane A4C EF: 62 %

## 2021-01-21 ENCOUNTER — Telehealth: Payer: Self-pay

## 2021-01-21 NOTE — Telephone Encounter (Signed)
-----   Message from Park Liter, MD sent at 01/21/2021  9:48 AM EDT ----- Echocardiogram showed mildly diminished left ventricle ejection fraction as before, otherwise everything the same

## 2021-01-21 NOTE — Telephone Encounter (Signed)
Spoke with patient regarding results and recommendation.  Patient verbalizes understanding and is agreeable to plan of care. Advised patient to call back with any issues or concerns.  

## 2021-02-21 DIAGNOSIS — M25522 Pain in left elbow: Secondary | ICD-10-CM | POA: Insufficient documentation

## 2021-02-21 DIAGNOSIS — M25512 Pain in left shoulder: Secondary | ICD-10-CM | POA: Insufficient documentation

## 2021-03-07 ENCOUNTER — Other Ambulatory Visit: Payer: Self-pay | Admitting: Infectious Diseases

## 2021-03-07 DIAGNOSIS — Z1231 Encounter for screening mammogram for malignant neoplasm of breast: Secondary | ICD-10-CM

## 2021-03-17 ENCOUNTER — Other Ambulatory Visit: Payer: Self-pay | Admitting: Neurology

## 2021-03-17 DIAGNOSIS — D329 Benign neoplasm of meninges, unspecified: Secondary | ICD-10-CM

## 2021-03-17 DIAGNOSIS — Z8616 Personal history of COVID-19: Secondary | ICD-10-CM

## 2021-03-17 HISTORY — DX: Personal history of COVID-19: Z86.16

## 2021-03-25 DIAGNOSIS — Z13228 Encounter for screening for other metabolic disorders: Secondary | ICD-10-CM | POA: Insufficient documentation

## 2021-03-28 ENCOUNTER — Other Ambulatory Visit: Payer: Self-pay

## 2021-03-28 ENCOUNTER — Encounter: Payer: Self-pay | Admitting: Cardiology

## 2021-03-28 ENCOUNTER — Ambulatory Visit (INDEPENDENT_AMBULATORY_CARE_PROVIDER_SITE_OTHER): Payer: BC Managed Care – PPO | Admitting: Cardiology

## 2021-03-28 VITALS — BP 134/74 | HR 86 | Ht 67.0 in | Wt 228.0 lb

## 2021-03-28 DIAGNOSIS — I428 Other cardiomyopathies: Secondary | ICD-10-CM

## 2021-03-28 DIAGNOSIS — I5042 Chronic combined systolic (congestive) and diastolic (congestive) heart failure: Secondary | ICD-10-CM

## 2021-03-28 NOTE — Patient Instructions (Signed)

## 2021-03-29 ENCOUNTER — Ambulatory Visit
Admission: RE | Admit: 2021-03-29 | Discharge: 2021-03-29 | Disposition: A | Discharge status: Home / Self Care | Payer: BC Managed Care – PPO | Source: Ambulatory Visit | Attending: Infectious Diseases | Admitting: Infectious Diseases

## 2021-03-29 ENCOUNTER — Ambulatory Visit
Admission: RE | Admit: 2021-03-29 | Discharge: 2021-03-29 | Disposition: A | Discharge status: Home / Self Care | Payer: BC Managed Care – PPO | Source: Ambulatory Visit | Attending: Neurology | Admitting: Neurology

## 2021-03-29 DIAGNOSIS — Z1231 Encounter for screening mammogram for malignant neoplasm of breast: Secondary | ICD-10-CM

## 2021-03-29 DIAGNOSIS — D329 Benign neoplasm of meninges, unspecified: Secondary | ICD-10-CM | POA: Insufficient documentation

## 2021-03-29 HISTORY — DX: Benign neoplasm of meninges, unspecified: D32.9

## 2021-03-29 MED ORDER — GADOBENATE DIMEGLUMINE 529 MG/ML IV SOLN
20.0000 mL | Freq: Once | INTRAVENOUS | Status: AC | PRN
  Administered 2021-03-29: 20 mL via INTRAVENOUS

## 2021-04-05 ENCOUNTER — Ambulatory Visit: Payer: Medicare Other | Admitting: Podiatry

## 2021-04-14 ENCOUNTER — Other Ambulatory Visit: Payer: Self-pay

## 2021-04-14 ENCOUNTER — Ambulatory Visit (INDEPENDENT_AMBULATORY_CARE_PROVIDER_SITE_OTHER): Payer: BC Managed Care – PPO

## 2021-04-14 ENCOUNTER — Ambulatory Visit (INDEPENDENT_AMBULATORY_CARE_PROVIDER_SITE_OTHER): Payer: BC Managed Care – PPO | Admitting: Podiatry

## 2021-04-14 DIAGNOSIS — M7731 Calcaneal spur, right foot: Secondary | ICD-10-CM | POA: Diagnosis not present

## 2021-04-14 DIAGNOSIS — M722 Plantar fascial fibromatosis: Secondary | ICD-10-CM

## 2021-04-14 MED ORDER — TRIAMCINOLONE ACETONIDE 10 MG/ML IJ SUSP
10.0000 mg | Freq: Once | INTRAMUSCULAR | Status: AC
  Administered 2021-04-14: 10 mg

## 2021-04-14 NOTE — Patient Instructions (Signed)

## 2021-04-15 NOTE — Progress Notes (Signed)
Subjective: 65 year old female presents the office today for concerns of pain to the right heel.It started off when she went back to the office full time. She was off of her foot recently while she was sick and the foot felt better but she is still in discomfort.  No recent injury or trauma that she reports.  Objective: AAO x3, NAD DP/PT pulses palpable bilaterally, CRT less than 3 seconds Tenderness to palpation along the plantar medial tubercle of the calcaneus at the insertion of plantar fascia on the right foot. There is no pain along the course of the plantar fascia within the arch of the foot. Plantar fascia appears to be intact. There is no pain with lateral compression of the calcaneus or pain with vibratory sensation. There is no pain along the course or insertion of the achilles tendon. No other areas of tenderness to bilateral lower extremities. No pain with calf compression, swelling, warmth, erythema  Assessment: Right heel pain, plantar fasciitis  Plan: -All treatment options discussed with the patient including all alternatives, risks, complications.  -Etiology of symptoms were discussed -X-rays were obtained and reviewed with the patient. Heel spur present.  No evidence of acute fracture. -Steroid injection performed.  See procedure note below. -Plantar fascial brace dispensed to help with stability and to immobilize the plantar pressure get better support. -Discussed stretching, icing daily. -Patient encouraged to call the office with any questions, concerns, change in symptoms.   Procedure: Injection Tendon/Ligament Discussed alternatives, risks, complications and verbal consent was obtained.  Location: Right plantar fascia at the glabrous junction; medial approach. Skin Prep: Alcohol. Injectate: 0.5cc 0.5% marcaine plain, 0.5 cc 2% lidocaine plain and, 1 cc kenalog 10. Disposition: Patient tolerated procedure well. Injection site dressed with a band-aid.  Post-injection  care was discussed and return precautions discussed.   No follow-ups on file.  Trula Slade DPM

## 2021-05-02 DIAGNOSIS — K625 Hemorrhage of anus and rectum: Secondary | ICD-10-CM

## 2021-05-02 HISTORY — DX: Hemorrhage of anus and rectum: K62.5

## 2021-05-16 ENCOUNTER — Ambulatory Visit: Payer: Self-pay | Admitting: General Surgery

## 2021-05-16 ENCOUNTER — Other Ambulatory Visit: Payer: Self-pay

## 2021-05-16 ENCOUNTER — Encounter (HOSPITAL_BASED_OUTPATIENT_CLINIC_OR_DEPARTMENT_OTHER): Payer: Self-pay | Admitting: General Surgery

## 2021-05-16 NOTE — H&P (Signed)
PROVIDER:  Monico Blitz, MD  MRN: [id number] DOB: 06/26/1956 DATE OF ENCOUNTER: 05/16/2021 Subjective   Chief Complaint: Hemorrhoids     History of Present Illness: Meagan Allen is a 65 y.o. female who is seen today for recurrent rectal bleeding.  Patient presents to the office with complaints of ongoing rectal bleeding.  She has a history of lifelong constipation and a recent diagnosis of ulcerative colitis.  She reports intermittent episodes of heavy rectal bleeding which applied last for approximately 1 week.  She is on Linzess for constipation and has watery bowel movements with this.  She underwent rubber band ligation back in 2019, but when I saw her 1 month later she had significant distal rectal inflammation and purulence consistent with an inflammatory condition and I recommended that she follow-up with her gastroenterologist first.  I have not seen her since then.  She reports significant anemia over the last year.  She had a recent colonoscopy which showed no signs of ulcerative colitis.  She is here today to discuss her hemorrhoidal bleeding.  She states that she had an episode approximately 2 weeks ago where she bled for several hours.  Past Medical History:  Diagnosis Date   Anemia    Anxiety    Arthritis    osteo   Cancer ([place])    thyroid cancer   CHF (congestive heart failure) ([place])    diagnosed feb 2019   Chronic kidney disease, stage 3a ([place]) 07/13/2020   Depression    Dysphagia 07/19/2020   Dyspnea    Enlarged liver    Expressive aphasia 03/15/2020   Fibromyalgia    Gastroesophageal reflux disease with esophagitis without hemorrhage 07/19/2020   GERD (gastroesophageal reflux disease)    Headache disorder 03/15/2020   Hearing loss of right ear    Heart murmur    High uric acid in 24 hour urine specimen 09/24/2019   History of colon polyps    History of corticosteroid therapy 07/13/2020   History of thyroid cancer 2007;  2009   dx papillary thyroid cancer w/  mets to cervical lymph nodes   HTN (hypertension) 03/25/2012   Hypercalcemia 07/13/2020   Hyperglycemia due to type 2 diabetes mellitus ([place]) 07/13/2020   Hyperlipidemia    Hypertension    Hypothyroidism 07/13/2020   IBS (irritable bowel syndrome)    Iron deficiency anemia 07/13/2020   Left rotator cuff tear    Lesion of liver 07/19/2020   Migraine    Mood change 03/15/2020   Morbid obesity ([place]) 07/13/2020   Nonischemic cardiomyopathy [facility]) cardiologist-  dr Edyth Gunnels   per cardiac cath 08-28-2017  moderate LV dysfunction w/ diffuse hypocontractility ,  ef 35-40%   Optic nerve and pathway injury, left, initial encounter    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, per opthamologist   Polyneuropathy due to type 2 diabetes mellitus ([place]) 07/13/2020   PONV (postoperative nausea and vomiting)    last colonoscopy propfol bp dropped and vomiting   Post-surgical hypothyroidism    Primary localized osteoarthrosis of ankle and foot 02/13/2020   Rosacea    Thyroid cancer ([place]) 06/25/2006   Type 2 diabetes mellitus ([employer])    followed by pcp   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[place])    Past Surgical History:  Procedure Laterality Date   CARDIOVASCULAR STRESS TEST  08-20-2017   dr Agustin Cree   High risk nuclear study w/ large irreverisible defect in the basal and mid inferoseptal, inferior, inferolateral walls and apical septal, inferior walls with small peri  infarct ischemia in the apical anterior & lateral walls (consistant w/ prior MI )/  no ST segment deviation noted /  nuclear stress ef 36%/  recommended cardiac cath   CARPOMETACARPAL [facility]) FUSION OF THUMB Bilateral 2003   COLONOSCOPY WITH PROPOFOL N/A 05/06/2013   Procedure: COLONOSCOPY WITH PROPOFOL;  Surgeon: Garlan Fair, MD;  Location: [facility];  Service: Endoscopy;  Laterality: N/A;   D & C HYSTEROSCOPY /  RESECTION POLYP/ ROLLER BALL ABLATION  02-26-2004   dr Kathyrn Drown  [facility]   DILATION AND CURETTAGE OF UTERUS  1984   EXCISION MORTON'S  NEUROMA Left 1996   giant cell tumor  2011   Left ankle 2011   JOINT REPLACEMENT     KNEE ARTHROSCOPY  08-16-2010  dr Beola Cord  [facility];  05/ 2012   right x2 left x1   LEFT HEART CATH AND CORONARY ANGIOGRAPHY N/A 08/28/2017   Procedure: LEFT HEART CATH AND CORONARY ANGIOGRAPHY;  Surgeon: Troy Sine, MD;  Location: [facility];  Service: Cardiovascular;  Laterality: N/A;   large normal coronary arteries in a dominant RCA system;  moderate LV systoic dysfunction w/ diffuse hypocontractility (compatible w/ nonischemic cardiomyopathy), LV end diastoilc pressure normal, LVEF 35-45% by visual estimate   Left knee replaced   05/2010   NASAL LACRIMAL DUCT SURGERY  06/14/2009   REDO RIGHT MODIFIED RADICAL NECK DISSECTION  08-29-2007    [facility]   SHOULDER ARTHROSCOPY WITH ROTATOR CUFF REPAIR AND SUBACROMIAL DECOMPRESSION Left 10/17/2017   Procedure: Left shoulder mini open rotator cuff repair, subacromial decompression;  Surgeon: [name], MD;  Location: [facility];  Service: Orthopedics;  Laterality: Left;  Interscalene Block   SHOULDER SURGERY     Left mini- open rotator cuff repair Dr. Tonita Cong 10-17-17   TOTAL KNEE ARTHROPLASTY Right 06/03/2018   Procedure: RIGHT TOTAL KNEE ARTHROPLASTY;  Surgeon: Vickey Huger, MD;  Location: [facility];  Service: Orthopedics;  Laterality: Right;   TOTAL THYROIDECTOMY  2007  -- [facility]   w/ dissection lymph nodes   TRANSTHORACIC ECHOCARDIOGRAM  08-20-2017  dr Agustin Cree   ef 25-30%, severe diffuse hypokinesis with no identifiable regional variations, but with profound dyssynchrony, due to arrhythmia insuffient to evaluate LV diastolic dysfunction/  trivial MR/  mild LAE/ Ventricular septum motion abnormal funtion,dyssynergy, & paradox   WRIST SURGERY Right 2001   Family History  Problem Relation Age of Onset   Heart disease Father        MI died age 33   Hypertension Father    Diabetes Father    Arthritis Mother        rheumatoid athritis   Stroke Mother     Depression Mother    Gout Brother    Cancer Brother        thyroid CA   Gout Brother    Gout Brother    Heart disease Sister        86 months old   Asthma Son    Breast cancer Neg Hx    Social History   Socioeconomic History   Marital status: Married    Spouse name: Jeneen Rinks   Number of children: 2   Years of education: 12   Highest education level: Not on file  Occupational History   Occupation: Banker for [employer]    Employer: [employer]  Tobacco Use   Smoking status: Former    Packs/day: 3.00    Years: 10.00    Pack years: 30.00    Types:  Cigarettes    Quit date: 07/17/1980    Years since quitting: 40.8   Smokeless tobacco: Never  Vaping Use   Vaping Use: Never used  Substance and Sexual Activity   Alcohol use: No   Drug use: No   Sexual activity: Yes    Birth control/protection: Post-menopausal  Other Topics Concern   Not on file  Social History Narrative   Maralyn was born in [place], [place]. She moved to [place] in 1978 when her family moved to this state. Georgean currently lives in [place] with her husband of 31 years. They have 2 adult children and 1 grandson. 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   Social Determinants of Health   Financial Resource Strain: Not on file  Food Insecurity: Not on file  Transportation Needs: Not on file  Physical Activity: Not on file  Stress: Not on file  Social Connections: Not on file  Intimate Partner Violence: Not on file    Current Outpatient Medications:    allopurinol (ZYLOPRIM) 300 MG tablet, Take 300 mg by mouth daily., Disp: , Rfl:    aspirin EC 81 MG tablet, Take 81 mg by mouth daily., Disp: , Rfl:    buPROPion (WELLBUTRIN XL) 150 MG 24 hr tablet, Take 450 mg by mouth every morning. , Disp: , Rfl:    Calcium-Magnesium-Vitamin D (CALCIUM 1200+D3 PO), Take 1 tablet by mouth 2 (two) times daily., Disp: , Rfl:    cetirizine (ZYRTEC) 10 MG tablet, Take 10  mg by mouth daily., Disp: , Rfl:    chlorhexidine (HIBICLENS) 4 % external liquid, Apply 1 application topically daily as needed (skin folds)., Disp: , Rfl:    Cholecalciferol (VITAMIN D) 50 MCG (2000 UT) CAPS, Take 1 capsule by mouth daily., Disp: , Rfl:    cinacalcet (SENSIPAR) 30 MG tablet, Take 30 mg by mouth daily., Disp: , Rfl:    clindamycin (CLEOCIN T) 1 % external solution, Apply 1 application topically 2 (two) times daily., Disp: , Rfl:    clonazePAM (KLONOPIN) 0.5 MG tablet, Take 0.5 mg by mouth at bedtime., Disp: , Rfl:    CONTOUR NEXT TEST test strip, 1 each by Other route as needed for other (Glucose test)., Disp: , Rfl:    diclofenac Sodium (VOLTAREN) 1 % GEL, Apply 2 g topically as needed (Arthritis pain)., Disp: , Rfl:    doxycycline (VIBRAMYCIN) 100 MG capsule, Take 100 mg by mouth as directed. Unknown directions per patient, Disp: , Rfl:    ENTRESTO 49-51 MG, TAKE 1 TABLET BY MOUTH TWICE A DAY (Patient taking differently: Take 1 tablet by mouth 2 (two) times daily.), Disp: 180 tablet, Rfl: 2   furosemide (LASIX) 20 MG tablet, TAKE 1 TABLET BY MOUTH EVERY DAY (Patient taking differently: Take 20 mg by mouth daily.), Disp: 90 tablet, Rfl: 2   HUMIRA PEN 40 MG/0.4ML PNKT, Inject 40 mg into the skin every 14 (fourteen) days., Disp: , Rfl:    insulin glargine (LANTUS) 100 UNIT/ML Solostar Pen, Inject 80 Units into the skin daily at 10 pm. , Disp: , Rfl:    levothyroxine (SYNTHROID, LEVOTHROID) 137 MCG tablet, Take 137 mcg by mouth at bedtime. , Disp: , Rfl:    liraglutide (VICTOZA) 18 MG/3ML SOPN, Inject 0.3 mLs into the skin daily., Disp: , Rfl:    mesalamine (LIALDA) 1.2 g EC tablet, Take 2.4 g by mouth 2 (two) times daily., Disp: , Rfl:    metFORMIN (GLUCOPHAGE-XR) 500  MG 24 hr tablet, Take 1,000 mg by mouth every evening., Disp: , Rfl:    methocarbamol (ROBAXIN) 500 MG tablet, Take 500 mg by mouth as needed for muscle spasms., Disp: , Rfl:    metoprolol succinate (TOPROL-XL) 100  MG 24 hr tablet, TAKE 1 TABLET BY MOUTH DAILY WITH OR IMMEDIATELY FOLLOWING A MEAL. (Patient taking differently: Take 100 mg by mouth daily.), Disp: 90 tablet, Rfl: 3   Misc Natural Products (COSAMIN ASU ADVANCED FORMULA) CAPS, Take 1 tablet by mouth daily. Unknown strength, Disp: , Rfl:    nystatin (MYCOSTATIN/NYSTOP) powder, Apply 1 application topically as needed (yeast)., Disp: , Rfl:    pantoprazole (PROTONIX) 40 MG tablet, Take 40 mg by mouth daily., Disp: , Rfl:    polyethylene glycol powder (GLYCOLAX/MIRALAX) powder, Take 17 g by mouth daily as needed for mild constipation or moderate constipation (for constipation.)., Disp: , Rfl:    rosuvastatin (CRESTOR) 5 MG tablet, Take 5 mg by mouth 3 (three) times a week., Disp: , Rfl:    TRULANCE 3 MG TABS, Take 1 tablet by mouth daily., Disp: , Rfl:    Allergies  Allergen Reactions   Hydroxychloroquine Other (See Comments)    Caused blisters in mouth   Sulfa Antibiotics Hives, Nausea Only and Other (See Comments)   Bydureon [Exenatide] Other (See Comments)    Yeast issues.   Gabapentin Other (See Comments)   Invokana [Canagliflozin] Other (See Comments)    Yeast issues.    Jardiance [Empagliflozin] Other (See Comments)    Yeast issues.    Lisinopril Other (See Comments)    Pt is unsure of exact reaction   Methotrexate Other (See Comments)    Elevated LFT    Other Other (See Comments)   Pravastatin Other (See Comments)    Body pain.   Propofol Nausea And Vomiting and Other (See Comments)    Blood pressure bottomed out/syncope   Review of Systems - Negative except as stated in HPI      Objective:    Vitals:   05/16/21 0921  BP: 136/80  Pulse: 94  Temp: 36.2 C (97.2 F)  SpO2: 98%  Weight: 98.6 kg (217 lb 6.4 oz)  Height: 170.2 cm (5' 7" )      General appearance - alert, well appearing, and in no distress CV:RRR Lungs: CTA Abdomen - soft, nontender, nondistended, no masses or organomegaly Rectal - normal rectal, no  masses   Labs, Imaging and Diagnostic Testing:   Procedure: Anoscopy Surgeon: Marcello Moores After the risks and benefits were explained, written consent was obtained for above procedure.  A medical assistant chaperone was present thoroughout the entire procedure.  Anesthesia: none Diagnosis: rectal bleeding Findings: Grade 2 hemorrhoids with minimal inflammation in all 3 locations.  Significant inflammation of the hemorrhoidal tissue around the dentate line.   Assessment and Plan:  Diagnoses and all orders for this visit:  Grade II hemorrhoids  65 year old female with longstanding constipation and ulcerative colitis who presents to the office with continued rectal bleeding.  On exam today this appears to be due to internal hemorrhoids.  I have recommended a transhemorrhoidal dearterialization.  We have discussed this in detail.  Risk include bleeding, pain, urinary retention and a small chance of recurrence.  All questions were answered.  Patient agrees to proceed with surgery.

## 2021-05-16 NOTE — H&P (View-Only) (Signed)
PROVIDER:  Monico Blitz, MD  MRN: [id number] DOB: 1955/07/18 DATE OF ENCOUNTER: 05/16/2021 Subjective   Chief Complaint: Hemorrhoids     History of Present Illness: Meagan Allen is a 65 y.o. female who is seen today for recurrent rectal bleeding.  Patient presents to the office with complaints of ongoing rectal bleeding.  She has a history of lifelong constipation and a recent diagnosis of ulcerative colitis.  She reports intermittent episodes of heavy rectal bleeding which applied last for approximately 1 week.  She is on Linzess for constipation and has watery bowel movements with this.  She underwent rubber band ligation back in 2019, but when I saw her 1 month later she had significant distal rectal inflammation and purulence consistent with an inflammatory condition and I recommended that she follow-up with her gastroenterologist first.  I have not seen her since then.  She reports significant anemia over the last year.  She had a recent colonoscopy which showed no signs of ulcerative colitis.  She is here today to discuss her hemorrhoidal bleeding.  She states that she had an episode approximately 2 weeks ago where she bled for several hours.  Past Medical History:  Diagnosis Date   Anemia    Anxiety    Arthritis    osteo   Cancer ([place])    thyroid cancer   CHF (congestive heart failure) ([place])    diagnosed feb 2019   Chronic kidney disease, stage 3a ([place]) 07/13/2020   Depression    Dysphagia 07/19/2020   Dyspnea    Enlarged liver    Expressive aphasia 03/15/2020   Fibromyalgia    Gastroesophageal reflux disease with esophagitis without hemorrhage 07/19/2020   GERD (gastroesophageal reflux disease)    Headache disorder 03/15/2020   Hearing loss of right ear    Heart murmur    High uric acid in 24 hour urine specimen 09/24/2019   History of colon polyps    History of corticosteroid therapy 07/13/2020   History of thyroid cancer 2007;  2009   dx papillary thyroid cancer w/  mets to cervical lymph nodes   HTN (hypertension) 03/25/2012   Hypercalcemia 07/13/2020   Hyperglycemia due to type 2 diabetes mellitus ([place]) 07/13/2020   Hyperlipidemia    Hypertension    Hypothyroidism 07/13/2020   IBS (irritable bowel syndrome)    Iron deficiency anemia 07/13/2020   Left rotator cuff tear    Lesion of liver 07/19/2020   Migraine    Mood change 03/15/2020   Morbid obesity ([place]) 07/13/2020   Nonischemic cardiomyopathy [facility]) cardiologist-  dr Edyth Gunnels   per cardiac cath 08-28-2017  moderate LV dysfunction w/ diffuse hypocontractility ,  ef 35-40%   Optic nerve and pathway injury, left, initial encounter    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, per opthamologist   Polyneuropathy due to type 2 diabetes mellitus ([place]) 07/13/2020   PONV (postoperative nausea and vomiting)    last colonoscopy propfol bp dropped and vomiting   Post-surgical hypothyroidism    Primary localized osteoarthrosis of ankle and foot 02/13/2020   Rosacea    Thyroid cancer ([place]) 06/25/2006   Type 2 diabetes mellitus ([place])    followed by pcp   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[place])    Past Surgical History:  Procedure Laterality Date   CARDIOVASCULAR STRESS TEST  08-20-2017   dr Agustin Cree   High risk nuclear study w/ large irreverisible defect in the basal and mid inferoseptal, inferior, inferolateral walls and apical septal, inferior walls with small peri  infarct ischemia in the apical anterior & lateral walls (consistant w/ prior MI )/  no ST segment deviation noted /  nuclear stress ef 36%/  recommended cardiac cath   CARPOMETACARPAL [facility]) FUSION OF THUMB Bilateral 2003   COLONOSCOPY WITH PROPOFOL N/A 05/06/2013   Procedure: COLONOSCOPY WITH PROPOFOL;  Surgeon: Garlan Fair, MD;  Location: [facility];  Service: Endoscopy;  Laterality: N/A;   D & C HYSTEROSCOPY /  RESECTION POLYP/ ROLLER BALL ABLATION  02-26-2004   dr Kathyrn Drown  [facility]   DILATION AND CURETTAGE OF UTERUS  1984   EXCISION MORTON'S  NEUROMA Left 1996   giant cell tumor  2011   Left ankle 2011   JOINT REPLACEMENT     KNEE ARTHROSCOPY  08-16-2010  dr Beola Cord  [facility];  05/ 2012   right x2 left x1   LEFT HEART CATH AND CORONARY ANGIOGRAPHY N/A 08/28/2017   Procedure: LEFT HEART CATH AND CORONARY ANGIOGRAPHY;  Surgeon: Troy Sine, MD;  Location: [place] [facility];  Service: Cardiovascular;  Laterality: N/A;   large normal coronary arteries in a dominant RCA system;  moderate LV systoic dysfunction w/ diffuse hypocontractility (compatible w/ nonischemic cardiomyopathy), LV end diastoilc pressure normal, LVEF 35-45% by visual estimate   Left knee replaced   05/2010   NASAL LACRIMAL DUCT SURGERY  06/14/2009   REDO RIGHT MODIFIED RADICAL NECK DISSECTION  08-29-2007    [facility]   SHOULDER ARTHROSCOPY WITH ROTATOR CUFF REPAIR AND SUBACROMIAL DECOMPRESSION Left 10/17/2017   Procedure: Left shoulder mini open rotator cuff repair, subacromial decompression;  Surgeon: [name], MD;  Location: [facility];  Service: Orthopedics;  Laterality: Left;  Interscalene Block   SHOULDER SURGERY     Left mini- open rotator cuff repair Dr. Tonita Cong 10-17-17   TOTAL KNEE ARTHROPLASTY Right 06/03/2018   Procedure: RIGHT TOTAL KNEE ARTHROPLASTY;  Surgeon: Vickey Huger, MD;  Location: [facility];  Service: Orthopedics;  Laterality: Right;   TOTAL THYROIDECTOMY  2007  -- [facility]   w/ dissection lymph nodes   TRANSTHORACIC ECHOCARDIOGRAM  08-20-2017  dr Agustin Cree   ef 25-30%, severe diffuse hypokinesis with no identifiable regional variations, but with profound dyssynchrony, due to arrhythmia insuffient to evaluate LV diastolic dysfunction/  trivial MR/  mild LAE/ Ventricular septum motion abnormal funtion,dyssynergy, & paradox   WRIST SURGERY Right 2001   Family History  Problem Relation Age of Onset   Heart disease Father        MI died age 22   Hypertension Father    Diabetes Father    Arthritis Mother        rheumatoid athritis   Stroke Mother     Depression Mother    Gout Brother    Cancer Brother        thyroid CA   Gout Brother    Gout Brother    Heart disease Sister        16 months old   Asthma Son    Breast cancer Neg Hx    Social History   Socioeconomic History   Marital status: Married    Spouse name: Jeneen Rinks   Number of children: 2   Years of education: 12   Highest education level: Not on file  Occupational History   Occupation: Banker for [employer]    Employer: [employer]  Tobacco Use   Smoking status: Former    Packs/day: 3.00    Years: 10.00    Pack years: 30.00    Types:  Cigarettes    Quit date: 07/17/1980    Years since quitting: 40.8   Smokeless tobacco: Never  Vaping Use   Vaping Use: Never used  Substance and Sexual Activity   Alcohol use: No   Drug use: No   Sexual activity: Yes    Birth control/protection: Post-menopausal  Other Topics Concern   Not on file  Social History Narrative   Connee was born in [place], [place]. She moved to [place] in 1978 when her family moved to this state. Ivianna currently lives in [place] with her husband of 50 years. They have 2 adult children and 1 grandson. 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   Social Determinants of Health   Financial Resource Strain: Not on file  Food Insecurity: Not on file  Transportation Needs: Not on file  Physical Activity: Not on file  Stress: Not on file  Social Connections: Not on file  Intimate Partner Violence: Not on file    Current Outpatient Medications:    allopurinol (ZYLOPRIM) 300 MG tablet, Take 300 mg by mouth daily., Disp: , Rfl:    aspirin EC 81 MG tablet, Take 81 mg by mouth daily., Disp: , Rfl:    buPROPion (WELLBUTRIN XL) 150 MG 24 hr tablet, Take 450 mg by mouth every morning. , Disp: , Rfl:    Calcium-Magnesium-Vitamin D (CALCIUM 1200+D3 PO), Take 1 tablet by mouth 2 (two) times daily., Disp: , Rfl:    cetirizine (ZYRTEC) 10 MG tablet, Take 10  mg by mouth daily., Disp: , Rfl:    chlorhexidine (HIBICLENS) 4 % external liquid, Apply 1 application topically daily as needed (skin folds)., Disp: , Rfl:    Cholecalciferol (VITAMIN D) 50 MCG (2000 UT) CAPS, Take 1 capsule by mouth daily., Disp: , Rfl:    cinacalcet (SENSIPAR) 30 MG tablet, Take 30 mg by mouth daily., Disp: , Rfl:    clindamycin (CLEOCIN T) 1 % external solution, Apply 1 application topically 2 (two) times daily., Disp: , Rfl:    clonazePAM (KLONOPIN) 0.5 MG tablet, Take 0.5 mg by mouth at bedtime., Disp: , Rfl:    CONTOUR NEXT TEST test strip, 1 each by Other route as needed for other (Glucose test)., Disp: , Rfl:    diclofenac Sodium (VOLTAREN) 1 % GEL, Apply 2 g topically as needed (Arthritis pain)., Disp: , Rfl:    doxycycline (VIBRAMYCIN) 100 MG capsule, Take 100 mg by mouth as directed. Unknown directions per patient, Disp: , Rfl:    ENTRESTO 49-51 MG, TAKE 1 TABLET BY MOUTH TWICE A DAY (Patient taking differently: Take 1 tablet by mouth 2 (two) times daily.), Disp: 180 tablet, Rfl: 2   furosemide (LASIX) 20 MG tablet, TAKE 1 TABLET BY MOUTH EVERY DAY (Patient taking differently: Take 20 mg by mouth daily.), Disp: 90 tablet, Rfl: 2   HUMIRA PEN 40 MG/0.4ML PNKT, Inject 40 mg into the skin every 14 (fourteen) days., Disp: , Rfl:    insulin glargine (LANTUS) 100 UNIT/ML Solostar Pen, Inject 80 Units into the skin daily at 10 pm. , Disp: , Rfl:    levothyroxine (SYNTHROID, LEVOTHROID) 137 MCG tablet, Take 137 mcg by mouth at bedtime. , Disp: , Rfl:    liraglutide (VICTOZA) 18 MG/3ML SOPN, Inject 0.3 mLs into the skin daily., Disp: , Rfl:    mesalamine (LIALDA) 1.2 g EC tablet, Take 2.4 g by mouth 2 (two) times daily., Disp: , Rfl:    metFORMIN (GLUCOPHAGE-XR) 500  MG 24 hr tablet, Take 1,000 mg by mouth every evening., Disp: , Rfl:    methocarbamol (ROBAXIN) 500 MG tablet, Take 500 mg by mouth as needed for muscle spasms., Disp: , Rfl:    metoprolol succinate (TOPROL-XL) 100  MG 24 hr tablet, TAKE 1 TABLET BY MOUTH DAILY WITH OR IMMEDIATELY FOLLOWING A MEAL. (Patient taking differently: Take 100 mg by mouth daily.), Disp: 90 tablet, Rfl: 3   Misc Natural Products (COSAMIN ASU ADVANCED FORMULA) CAPS, Take 1 tablet by mouth daily. Unknown strength, Disp: , Rfl:    nystatin (MYCOSTATIN/NYSTOP) powder, Apply 1 application topically as needed (yeast)., Disp: , Rfl:    pantoprazole (PROTONIX) 40 MG tablet, Take 40 mg by mouth daily., Disp: , Rfl:    polyethylene glycol powder (GLYCOLAX/MIRALAX) powder, Take 17 g by mouth daily as needed for mild constipation or moderate constipation (for constipation.)., Disp: , Rfl:    rosuvastatin (CRESTOR) 5 MG tablet, Take 5 mg by mouth 3 (three) times a week., Disp: , Rfl:    TRULANCE 3 MG TABS, Take 1 tablet by mouth daily., Disp: , Rfl:    Allergies  Allergen Reactions   Hydroxychloroquine Other (See Comments)    Caused blisters in mouth   Sulfa Antibiotics Hives, Nausea Only and Other (See Comments)   Bydureon [Exenatide] Other (See Comments)    Yeast issues.   Gabapentin Other (See Comments)   Invokana [Canagliflozin] Other (See Comments)    Yeast issues.    Jardiance [Empagliflozin] Other (See Comments)    Yeast issues.    Lisinopril Other (See Comments)    Pt is unsure of exact reaction   Methotrexate Other (See Comments)    Elevated LFT    Other Other (See Comments)   Pravastatin Other (See Comments)    Body pain.   Propofol Nausea And Vomiting and Other (See Comments)    Blood pressure bottomed out/syncope   Review of Systems - Negative except as stated in HPI      Objective:    Vitals:   05/16/21 0921  BP: 136/80  Pulse: 94  Temp: 36.2 C (97.2 F)  SpO2: 98%  Weight: 98.6 kg (217 lb 6.4 oz)  Height: 170.2 cm (5' 7" )      General appearance - alert, well appearing, and in no distress CV:RRR Lungs: CTA Abdomen - soft, nontender, nondistended, no masses or organomegaly Rectal - normal rectal, no  masses   Labs, Imaging and Diagnostic Testing:   Procedure: Anoscopy Surgeon: Marcello Moores After the risks and benefits were explained, written consent was obtained for above procedure.  A medical assistant chaperone was present thoroughout the entire procedure.  Anesthesia: none Diagnosis: rectal bleeding Findings: Grade 2 hemorrhoids with minimal inflammation in all 3 locations.  Significant inflammation of the hemorrhoidal tissue around the dentate line.   Assessment and Plan:  Diagnoses and all orders for this visit:  Grade II hemorrhoids  65 year old female with longstanding constipation and ulcerative colitis who presents to the office with continued rectal bleeding.  On exam today this appears to be due to internal hemorrhoids.  I have recommended a transhemorrhoidal dearterialization.  We have discussed this in detail.  Risk include bleeding, pain, urinary retention and a small chance of recurrence.  All questions were answered.  Patient agrees to proceed with surgery.

## 2021-05-16 NOTE — Progress Notes (Addendum)
  Spoke w/ via phone for pre-op interview---pt Lab needs dos----    I stat           Lab results------see below COVID test -----patient states asymptomatic no test needed Arrive at -------930 am 05-20-2021 NPO after MN NO Solid Food.  Clear liquids from MN until---830 am Med rec completed Medications to take morning of surgery -----Delene Loll, metoprolol succinate, bupropion, cinacalet, pantaprazole Diabetic medication -----no diabetic medications day of surgery, take 1/2 dose hs lantus night before surgery on 05-19-2021 (take 8 units) Patient instructed no nail polish to be worn day of surgery Patient instructed to bring photo id and insurance card day of surgery Patient aware to have Driver (ride ) / caregiver    for 24 hours after surgery spouse james Patient Special Instructions -----none Pre-Op special Istructions -----none Patient verbalized understanding of instructions that were given at this phone interview. Patient denies shortness of breath, chest pain, fever, cough at this phone interview.    Anesthesia review:Spoke with dr Benjamine Mola finucaine mda and reviewed last stress test 08-21-2017 ef 36 % and last echo 01-19-2021 ef 40 to 45 %, pt ok for [facility] per dr Elgie Congo mda. Pt stated no cardiac  s & s or sob or lower extremity swelling  at pre op phone call.   PCP:dr david fitzgerald  Cardiologist : dr Jenne Campus lov 03-28-2021 epic [place] neurology dr Gurney Maxin 02-28-2021 care everywhere (mengioma, mood changes, headache) [facility] endocrinology dr Kathlene Cote 01-10-2021 [facility] rheumatology dr Elenore Rota shah 12-20-2020 [facility] everywhere Brain mri 03-29-2021 epic Ct angio chest 06-24-2020 epic EKG :03-28-2021 chart/epic Echo :01-19-2021 epic Stress test:08-31-2017 epic Cardiac Cath :08-28-2017 epic Activity level: works full and does own housework and can climb flight of stairs without problems Sleep Study/ CPAP :none Fasting Blood Sugar :   100, after meals blood sugar 126    /  Checks Blood Sugar 2 - times a day:   ASA / Instructions/ Last Dose :  patient stated not instructed on 81 mg aspirin by surgeon, last dose to be day before surgery on 05-19-2021 per pt

## 2021-05-20 ENCOUNTER — Ambulatory Visit (HOSPITAL_BASED_OUTPATIENT_CLINIC_OR_DEPARTMENT_OTHER): Payer: BC Managed Care – PPO | Admitting: Anesthesiology

## 2021-05-20 ENCOUNTER — Ambulatory Visit (HOSPITAL_BASED_OUTPATIENT_CLINIC_OR_DEPARTMENT_OTHER)
Admission: RE | Admit: 2021-05-20 | Discharge: 2021-05-20 | Disposition: A | Discharge status: Home / Self Care | Payer: BC Managed Care – PPO | Attending: General Surgery | Admitting: General Surgery

## 2021-05-20 ENCOUNTER — Other Ambulatory Visit: Payer: Self-pay

## 2021-05-20 ENCOUNTER — Encounter (HOSPITAL_BASED_OUTPATIENT_CLINIC_OR_DEPARTMENT_OTHER): Payer: Self-pay | Admitting: General Surgery

## 2021-05-20 ENCOUNTER — Encounter (HOSPITAL_BASED_OUTPATIENT_CLINIC_OR_DEPARTMENT_OTHER)
Admission: RE | Disposition: A | Discharge status: Home / Self Care | Payer: Self-pay | Source: Home / Self Care | Attending: General Surgery

## 2021-05-20 DIAGNOSIS — K51911 Ulcerative colitis, unspecified with rectal bleeding: Secondary | ICD-10-CM | POA: Diagnosis not present

## 2021-05-20 DIAGNOSIS — E890 Postprocedural hypothyroidism: Secondary | ICD-10-CM | POA: Diagnosis not present

## 2021-05-20 DIAGNOSIS — Z888 Allergy status to other drugs, medicaments and biological substances status: Secondary | ICD-10-CM | POA: Diagnosis not present

## 2021-05-20 DIAGNOSIS — E1122 Type 2 diabetes mellitus with diabetic chronic kidney disease: Secondary | ICD-10-CM | POA: Insufficient documentation

## 2021-05-20 DIAGNOSIS — Z79899 Other long term (current) drug therapy: Secondary | ICD-10-CM | POA: Insufficient documentation

## 2021-05-20 DIAGNOSIS — K219 Gastro-esophageal reflux disease without esophagitis: Secondary | ICD-10-CM | POA: Insufficient documentation

## 2021-05-20 DIAGNOSIS — E785 Hyperlipidemia, unspecified: Secondary | ICD-10-CM | POA: Insufficient documentation

## 2021-05-20 DIAGNOSIS — Z7985 Long-term (current) use of injectable non-insulin antidiabetic drugs: Secondary | ICD-10-CM | POA: Diagnosis not present

## 2021-05-20 DIAGNOSIS — Z6833 Body mass index (BMI) 33.0-33.9, adult: Secondary | ICD-10-CM | POA: Insufficient documentation

## 2021-05-20 DIAGNOSIS — Z7984 Long term (current) use of oral hypoglycemic drugs: Secondary | ICD-10-CM | POA: Diagnosis not present

## 2021-05-20 DIAGNOSIS — N1831 Chronic kidney disease, stage 3a: Secondary | ICD-10-CM | POA: Insufficient documentation

## 2021-05-20 DIAGNOSIS — Z87891 Personal history of nicotine dependence: Secondary | ICD-10-CM | POA: Diagnosis not present

## 2021-05-20 DIAGNOSIS — Z86010 Personal history of colonic polyps: Secondary | ICD-10-CM | POA: Diagnosis not present

## 2021-05-20 DIAGNOSIS — D649 Anemia, unspecified: Secondary | ICD-10-CM | POA: Insufficient documentation

## 2021-05-20 DIAGNOSIS — Z7982 Long term (current) use of aspirin: Secondary | ICD-10-CM | POA: Insufficient documentation

## 2021-05-20 DIAGNOSIS — Z794 Long term (current) use of insulin: Secondary | ICD-10-CM | POA: Insufficient documentation

## 2021-05-20 DIAGNOSIS — I130 Hypertensive heart and chronic kidney disease with heart failure and stage 1 through stage 4 chronic kidney disease, or unspecified chronic kidney disease: Secondary | ICD-10-CM | POA: Insufficient documentation

## 2021-05-20 DIAGNOSIS — M797 Fibromyalgia: Secondary | ICD-10-CM | POA: Insufficient documentation

## 2021-05-20 DIAGNOSIS — K642 Third degree hemorrhoids: Secondary | ICD-10-CM | POA: Diagnosis present

## 2021-05-20 DIAGNOSIS — Z882 Allergy status to sulfonamides status: Secondary | ICD-10-CM | POA: Diagnosis not present

## 2021-05-20 HISTORY — DX: Presence of spectacles and contact lenses: Z97.3

## 2021-05-20 HISTORY — DX: Unspecified hearing loss, right ear: H91.91

## 2021-05-20 HISTORY — DX: Unspecified hearing loss, unspecified ear: H91.90

## 2021-05-20 HISTORY — PX: TRANSANAL HEMORRHOIDAL DEARTERIALIZATION: SHX6136

## 2021-05-20 HISTORY — DX: Presence of external hearing-aid: Z97.4

## 2021-05-20 HISTORY — DX: Emotional lability: R45.86

## 2021-05-20 LAB — POCT I-STAT, CHEM 8
BUN: 17 mg/dL (ref 8–23)
Calcium, Ion: 1.14 mmol/L — ABNORMAL LOW (ref 1.15–1.40)
Chloride: 105 mmol/L (ref 98–111)
Creatinine, Ser: 0.8 mg/dL (ref 0.44–1.00)
Glucose, Bld: 111 mg/dL — ABNORMAL HIGH (ref 70–99)
HCT: 35 % — ABNORMAL LOW (ref 36.0–46.0)
Hemoglobin: 11.9 g/dL — ABNORMAL LOW (ref 12.0–15.0)
Potassium: 3.8 mmol/L (ref 3.5–5.1)
Sodium: 140 mmol/L (ref 135–145)
TCO2: 23 mmol/L (ref 22–32)

## 2021-05-20 SURGERY — TRANSANAL HEMORRHOIDAL DEARTERIALIZATION
Anesthesia: Monitor Anesthesia Care | Site: Rectum

## 2021-05-20 MED ORDER — ONDANSETRON HCL 4 MG/2ML IJ SOLN: 4.0000 mg | Freq: Once | INTRAMUSCULAR | Status: DC | PRN

## 2021-05-20 MED ORDER — ACETAMINOPHEN 325 MG RE SUPP: 650.0000 mg | RECTAL | Status: DC | PRN

## 2021-05-20 MED ORDER — BUPIVACAINE LIPOSOME 1.3 % IJ SUSP
INTRAMUSCULAR | Status: DC | PRN
  Administered 2021-05-20: 20 mL

## 2021-05-20 MED ORDER — OXYCODONE HCL 5 MG PO TABS: 5.0000 mg | ORAL_TABLET | Freq: Once | ORAL | Status: DC | PRN

## 2021-05-20 MED ORDER — FENTANYL CITRATE (PF) 100 MCG/2ML IJ SOLN: 25.0000 ug | INTRAMUSCULAR | Status: DC | PRN

## 2021-05-20 MED ORDER — MIDAZOLAM HCL 2 MG/2ML IJ SOLN
INTRAMUSCULAR | Status: AC
  Filled 2021-05-20: qty 2

## 2021-05-20 MED ORDER — PROPOFOL 10 MG/ML IV BOLUS
INTRAVENOUS | Status: DC | PRN
  Administered 2021-05-20: 20 mg via INTRAVENOUS

## 2021-05-20 MED ORDER — PROPOFOL 500 MG/50ML IV EMUL
INTRAVENOUS | Status: DC | PRN
  Administered 2021-05-20: 200 ug/kg/min via INTRAVENOUS

## 2021-05-20 MED ORDER — OXYCODONE HCL 5 MG PO TABS: 5.0000 mg | ORAL_TABLET | ORAL | Status: DC | PRN

## 2021-05-20 MED ORDER — OXYCODONE HCL 5 MG/5ML PO SOLN
5.0000 mg | Freq: Once | ORAL | Status: DC | PRN
Start: 2021-05-20 — End: 2021-05-20

## 2021-05-20 MED ORDER — OXYCODONE HCL 5 MG PO TABS: 5.0000 mg | ORAL_TABLET | Freq: Four times a day (QID) | ORAL | 0 refills | Status: DC | PRN

## 2021-05-20 MED ORDER — LACTATED RINGERS IV SOLN: INTRAVENOUS | Status: DC

## 2021-05-20 MED ORDER — PHENYLEPHRINE 40 MCG/ML (10ML) SYRINGE FOR IV PUSH (FOR BLOOD PRESSURE SUPPORT)
PREFILLED_SYRINGE | INTRAVENOUS | Status: DC | PRN
  Administered 2021-05-20: 120 ug via INTRAVENOUS
  Administered 2021-05-20 (×2): 80 ug via INTRAVENOUS

## 2021-05-20 MED ORDER — ACETAMINOPHEN 500 MG PO TABS
ORAL_TABLET | ORAL | Status: AC
  Filled 2021-05-20: qty 2

## 2021-05-20 MED ORDER — PHENYLEPHRINE 40 MCG/ML (10ML) SYRINGE FOR IV PUSH (FOR BLOOD PRESSURE SUPPORT)
PREFILLED_SYRINGE | INTRAVENOUS | Status: AC
  Filled 2021-05-20: qty 10

## 2021-05-20 MED ORDER — SODIUM CHLORIDE 0.9 % IV SOLN: 250.0000 mL | INTRAVENOUS | Status: DC | PRN

## 2021-05-20 MED ORDER — SODIUM CHLORIDE 0.9% FLUSH: 3.0000 mL | Freq: Two times a day (BID) | INTRAVENOUS | Status: DC

## 2021-05-20 MED ORDER — ACETAMINOPHEN 325 MG PO TABS: 650.0000 mg | ORAL_TABLET | ORAL | Status: DC | PRN

## 2021-05-20 MED ORDER — FENTANYL CITRATE (PF) 100 MCG/2ML IJ SOLN
INTRAMUSCULAR | Status: DC | PRN
  Administered 2021-05-20: 25 ug via INTRAVENOUS
  Administered 2021-05-20: 50 ug via INTRAVENOUS
  Administered 2021-05-20: 25 ug via INTRAVENOUS

## 2021-05-20 MED ORDER — 0.9 % SODIUM CHLORIDE (POUR BTL) OPTIME
TOPICAL | Status: DC | PRN
  Administered 2021-05-20: 500 mL

## 2021-05-20 MED ORDER — ONDANSETRON HCL 4 MG/2ML IJ SOLN
INTRAMUSCULAR | Status: DC | PRN
  Administered 2021-05-20: 4 mg via INTRAVENOUS

## 2021-05-20 MED ORDER — FENTANYL CITRATE (PF) 100 MCG/2ML IJ SOLN
INTRAMUSCULAR | Status: AC
  Filled 2021-05-20: qty 2

## 2021-05-20 MED ORDER — LIDOCAINE 2% (20 MG/ML) 5 ML SYRINGE
INTRAMUSCULAR | Status: AC
  Filled 2021-05-20: qty 5

## 2021-05-20 MED ORDER — SODIUM CHLORIDE 0.9% FLUSH: 3.0000 mL | INTRAVENOUS | Status: DC | PRN

## 2021-05-20 MED ORDER — DEXAMETHASONE SODIUM PHOSPHATE 10 MG/ML IJ SOLN
INTRAMUSCULAR | Status: AC
  Filled 2021-05-20: qty 1

## 2021-05-20 MED ORDER — ACETAMINOPHEN 500 MG PO TABS: 1000.0000 mg | ORAL_TABLET | Freq: Once | ORAL | Status: AC

## 2021-05-20 MED ORDER — PROPOFOL 10 MG/ML IV BOLUS
INTRAVENOUS | Status: AC
  Filled 2021-05-20: qty 20

## 2021-05-20 MED ORDER — ONDANSETRON HCL 4 MG/2ML IJ SOLN
INTRAMUSCULAR | Status: AC
  Filled 2021-05-20: qty 2

## 2021-05-20 MED ORDER — BUPIVACAINE LIPOSOME 1.3 % IJ SUSP: 20.0000 mL | Freq: Once | INTRAMUSCULAR | Status: DC

## 2021-05-20 MED ORDER — BUPIVACAINE-EPINEPHRINE 0.5% -1:200000 IJ SOLN
INTRAMUSCULAR | Status: DC | PRN
  Administered 2021-05-20: 30 mL

## 2021-05-20 MED ORDER — MIDAZOLAM HCL 5 MG/5ML IJ SOLN
INTRAMUSCULAR | Status: DC | PRN
  Administered 2021-05-20 (×2): 1 mg via INTRAVENOUS

## 2021-05-20 MED ORDER — PROPOFOL 1000 MG/100ML IV EMUL
INTRAVENOUS | Status: AC
  Filled 2021-05-20: qty 100

## 2021-05-20 MED ORDER — ACETAMINOPHEN 500 MG PO TABS
1000.0000 mg | ORAL_TABLET | ORAL | Status: AC
  Administered 2021-05-20: 1000 mg via ORAL

## 2021-05-20 SURGICAL SUPPLY — 34 items
COVER BACK TABLE 60X90IN (DRAPES) ×2 IMPLANT
DECANTER SPIKE VIAL GLASS SM (MISCELLANEOUS) IMPLANT
DRAPE HYSTEROSCOPY (MISCELLANEOUS) ×2 IMPLANT
DRAPE SHEET LG 3/4 BI-LAMINATE (DRAPES) ×2 IMPLANT
DRSG PAD ABDOMINAL 8X10 ST (GAUZE/BANDAGES/DRESSINGS) ×1 IMPLANT
ELECT REM PT RETURN 9FT ADLT (ELECTROSURGICAL) ×2
ELECTRODE REM PT RTRN 9FT ADLT (ELECTROSURGICAL) ×1 IMPLANT
GAUZE 4X4 16PLY ~~LOC~~+RFID DBL (SPONGE) ×2 IMPLANT
GAUZE SPONGE 4X4 12PLY STRL (GAUZE/BANDAGES/DRESSINGS) ×1 IMPLANT
GAUZE SPONGE 4X4 12PLY STRL LF (GAUZE/BANDAGES/DRESSINGS) ×1 IMPLANT
GLOVE SURG ENC MOIS LTX SZ6.5 (GLOVE) ×2 IMPLANT
GLOVE SURG UNDER LTX SZ6.5 (GLOVE) ×2 IMPLANT
GOWN STRL REUS W/TWL XL LVL3 (GOWN DISPOSABLE) ×2 IMPLANT
HEMOSTAT SURGICEL 4X8 (HEMOSTASIS) IMPLANT
KIT SIGMOIDOSCOPE (SET/KITS/TRAYS/PACK) IMPLANT
KIT SLIDE ONE PROLAPS HEMORR (KITS) ×1 IMPLANT
KIT TURNOVER CYSTO (KITS) ×2 IMPLANT
LEGGING LITHOTOMY PAIR STRL (DRAPES) ×2 IMPLANT
LUBRICANT JELLY K Y 4OZ (MISCELLANEOUS) ×2 IMPLANT
NEEDLE HYPO 22GX1.5 SAFETY (NEEDLE) ×2 IMPLANT
PACK BASIN DAY SURGERY FS (CUSTOM PROCEDURE TRAY) ×2 IMPLANT
PAD ARMBOARD 7.5X6 YLW CONV (MISCELLANEOUS) IMPLANT
PANTS MESH DISP LRG (UNDERPADS AND DIAPERS) IMPLANT
PANTS MESH DISPOSABLE L (UNDERPADS AND DIAPERS) ×1
PENCIL SMOKE EVACUATOR (MISCELLANEOUS) ×2 IMPLANT
SPONGE HEMORRHOID 8X3CM (HEMOSTASIS) ×1 IMPLANT
SUT CHROMIC 2 0 SH (SUTURE) IMPLANT
SUT CHROMIC 3 0 SH 27 (SUTURE) IMPLANT
SUT VIC AB 2-0 UR6 27 (SUTURE) IMPLANT
SYR CONTROL 10ML LL (SYRINGE) ×2 IMPLANT
TOWEL OR 17X26 10 PK STRL BLUE (TOWEL DISPOSABLE) ×2 IMPLANT
TRAY DSU PREP LF (CUSTOM PROCEDURE TRAY) ×2 IMPLANT
TUBE CONNECTING 12X1/4 (SUCTIONS) ×2 IMPLANT
YANKAUER SUCT BULB TIP NO VENT (SUCTIONS) ×2 IMPLANT

## 2021-05-20 NOTE — Anesthesia Postprocedure Evaluation (Signed)
Anesthesia Post Note  Patient: Meagan Allen  Procedure(s) Performed: TRANSANAL HEMORRHOIDAL DEARTERIALIZATION (Rectum)     Patient location during evaluation: PACU Anesthesia Type: MAC Level of consciousness: awake and alert Pain management: pain level controlled Vital Signs Assessment: post-procedure vital signs reviewed and stable Respiratory status: spontaneous breathing, nonlabored ventilation and respiratory function stable Cardiovascular status: blood pressure returned to baseline and stable Postop Assessment: no apparent nausea or vomiting Anesthetic complications: no   No notable events documented.  Last Vitals:  Vitals:   05/20/21 1330 05/20/21 1402  BP: 109/66 137/70  Pulse: 66 66  Resp: 11 12  Temp:  36.6 C  SpO2: 93% 100%    Last Pain:  Vitals:   05/20/21 1402  TempSrc:   PainSc: 0-No pain                 Pervis Hocking

## 2021-05-20 NOTE — Anesthesia Preprocedure Evaluation (Addendum)
Anesthesia Evaluation  Patient identified by MRN, date of birth, ID band Patient awake    Reviewed: Allergy & Precautions, NPO status , Patient's Chart, lab work & pertinent test results  History of Anesthesia Complications (+) PONV and history of anesthetic complications  Airway Mallampati: II  TM Distance: >3 FB Neck ROM: Full    Dental no notable dental hx. (+) Teeth Intact, Dental Advisory Given   Pulmonary shortness of breath and with exertion, former smoker,  Quit smoking 1982, 30 pack year history  Snores at night, has had multiple negative sleep studies   Pulmonary exam normal breath sounds clear to auscultation       Cardiovascular hypertension, Pt. on medications and Pt. on home beta blockers +CHF (LVEF 40-45%)  Normal cardiovascular exam Rhythm:Regular Rate:Normal  Echo 01/2021: 1. Left ventricular ejection fraction, by estimation, is 40 to 45%. The  left ventricle has mildly decreased function. The left ventricle has no  regional wall motion abnormalities. There is mild asymmetric left  ventricular hypertrophy. Left ventricular  diastolic parameters are indeterminate.  2. Right ventricular systolic function is normal. The right ventricular  size is normal.  3. The mitral valve is normal in structure. No evidence of mitral valve  regurgitation. No evidence of mitral stenosis.  4. The aortic valve is normal in structure. Aortic valve regurgitation is  not visualized. No aortic stenosis is present.  5. The inferior vena cava is normal in size with greater than 50%  respiratory variability, suggesting right atrial pressure of 3 mmHg.    Neuro/Psych  Headaches, PSYCHIATRIC DISORDERS Anxiety Depression L frontal lobe meningioma being monitored w/ serial imaging     GI/Hepatic Neg liver ROS, GERD  Medicated and Controlled,Rectal bleeding, grade 2 hemorrhoids IBS, UC    Endo/Other  diabetes, Well Controlled,  Type 2, Insulin Dependent, Oral Hypoglycemic AgentsHypothyroidism Obesity BMI 34 No recent a1c on record  Renal/GU Renal InsufficiencyRenal diseaseCKD 3, Cr 1.02  negative genitourinary   Musculoskeletal  (+) Arthritis , Osteoarthritis,  Fibromyalgia - (no chronic narcotics)  Abdominal (+) + obese,   Peds  Hematology negative hematology ROS (+)   Anesthesia Other Findings HOH L ear  Reproductive/Obstetrics negative OB ROS                            Anesthesia Physical Anesthesia Plan  ASA: 3  Anesthesia Plan: MAC   Post-op Pain Management:    Induction:   PONV Risk Score and Plan: 3 and TIVA, Propofol infusion and Treatment may vary due to age or medical condition  Airway Management Planned: Natural Airway and Simple Face Mask  Additional Equipment: None  Intra-op Plan:   Post-operative Plan: Extubation in OR  Informed Consent: I have reviewed the patients History and Physical, chart, labs and discussed the procedure including the risks, benefits and alternatives for the proposed anesthesia with the patient or authorized representative who has indicated his/her understanding and acceptance.     Dental advisory given  Plan Discussed with: CRNA  Anesthesia Plan Comments: (Per pt has had issues w/ hypotension during colonoscopies in which propofol was used- will titrate slowly, pressors available)       Anesthesia Quick Evaluation

## 2021-05-20 NOTE — Transfer of Care (Addendum)
Immediate Anesthesia Transfer of Care Note  Patient: Meagan Allen  Procedure(s) Performed: Procedure(s) (LRB): TRANSANAL HEMORRHOIDAL DEARTERIALIZATION (N/A)  Patient Location: PACU  Anesthesia Type: MAC  Level of Consciousness: awake, sedated, patient cooperative and responds to stimulation  Airway & Oxygen Therapy: Patient Spontanous Breathing and Patient connected on RA and soft FM   Post-op Assessment: Report given to PACU RN, Post -op Vital signs reviewed and stable and Patient moving all extremities  Post vital signs: Reviewed and stable  Complications: No apparent anesthesia complications

## 2021-05-20 NOTE — Interval H&P Note (Signed)
History and Physical Interval Note:  05/20/2021 11:30 AM  Meagan Allen  has presented today for surgery, with the diagnosis of RECTAL BLEEDING, GRADE 2 HEMORRHOIDS.  The various methods of treatment have been discussed with the patient and family. After consideration of risks, benefits and other options for treatment, the patient has consented to  Procedure(s): TRANSANAL HEMORRHOIDAL DEARTERIALIZATION (N/A) as a surgical intervention.  The patient's history has been reviewed, patient examined, no change in status, stable for surgery.  I have reviewed the patient's chart and labs.  Questions were answered to the patient's satisfaction.     Rosario Adie, MD  [facility]

## 2021-05-20 NOTE — Op Note (Signed)
05/20/2021  12:31 PM  PATIENT:  Meagan Allen  65 y.o. female  Patient Care Team: Leonel Ramsay, MD as PCP - General (Infectious Diseases) Park Liter, MD as PCP - Cardiology (Cardiology) [name], MD as Consulting Physician (Orthopedic Surgery)  PRE-OPERATIVE DIAGNOSIS:  RECTAL BLEEDING, GRADE 2 HEMORRHOIDS  POST-OPERATIVE DIAGNOSIS:  RECTAL BLEEDING, GRADE 3 HEMORRHOIDS  PROCEDURE:  TRANSANAL HEMORRHOIDAL DEARTERIALIZATION   Surgeon(s): Leighton Ruff, MD  ASSISTANT: none   ANESTHESIA:   local and MAC  EBL:  No intake/output data recorded.  DRAINS: none   SPECIMEN:  No Specimen  DISPOSITION OF SPECIMEN:  N/A  COUNTS:  YES  PLAN OF CARE: Discharge to home after PACU  PATIENT DISPOSITION:  PACU - hemodynamically stable.  INDICATION: 65 y.o. F with chronic constipation and bleeding internal hemorrhoids   OR FINDINGS: Grade 3 internal hemorrhoids  Description: Informed consent was confirmed. Patient underwent general anesthesia without difficulty. Patient was placed into lithotomy positioning.  The perianal region was prepped and draped in sterile fashion. Surgical time out confirmed or plan.  I did digital rectal examination and then transitioned over to anoscopy to get a sense of the anatomy.  I switched over to the [facility] fiberoptically lit Doppler anocope.   Using the Doppler on the tip of the Spivey anoscope, I identified the arterial hemorrhoidal vessels coming in in the classic hexagonal anatomical pattern  (right posterior/lateral/anterior, left posterior /lateral/anterior).    I proceeded to ligate the hemorrhoidal arteries. I used a 2-0 Vicryl suture on a UR-6 needle in a figure-of-eight fashion over the signal around 6 cm proximal to the anal verge. I then ran that stitch longitudinally more distally to the dentate line. I then tied that stitch down to cause a hemorrhoidopexy. I did that for all 6 locations.    I redid Doppler anoscopy. I  Identified a signal at the right and left posterior locations.  I isolated and ligated this with a figure-of-eight stitch. Signals went away.  At completion of this, all hemorrhoids were reduced into the rectum.  There was no more prolapse. External anatomy looked normal.  I repeated anoscopy and examination.  Hemostasis was good. I placed a soft Gelfoam cylinder into the rectum. Patient is being extubated go to recovery room.  I am about to discuss the patient's status to the family.

## 2021-05-20 NOTE — Discharge Instructions (Addendum)
ANORECTAL SURGERY: POST OP INSTRUCTIONS Take your usually prescribed home medications unless otherwise directed. DIET: During the first few hours after surgery sip on some liquids until you are able to urinate.  It is normal to not urinate for several hours after this surgery.  If you feel uncomfortable, please contact the office for instructions.  After you are able to urinate,you may eat, if you feel like it.  Follow a light bland diet the first 24 hours after arrival home, such as soup, liquids, crackers, etc.  Be sure to include lots of fluids daily (6-8 glasses).  Avoid fast food or heavy meals, as your are more likely to get nauseated.  Eat a low fat diet the next few days after surgery.  Limit caffeine intake to 1-2 servings a day. PAIN CONTROL: Pain is best controlled by a usual combination of several different methods TOGETHER: Muscle relaxation: Soak in a warm bath (or Sitz bath) three times a day and after bowel movements.  Continue to do this until all pain is resolved. Over the counter pain medication Prescription pain medication Most patients will experience some swelling and discomfort in the anus/rectal area and incisions.  Heat such as warm towels, sitz baths, warm baths, etc to help relax tight/sore spots and speed recovery.  Some people prefer to use ice, especially in the first couple days after surgery, as it may decrease the pain and swelling, or alternate between ice & heat.  Experiment to what works for you.  Swelling and bruising can take several weeks to resolve.  Pain can take even longer to completely resolve. It is helpful to take an over-the-counter pain medication regularly for the first few weeks.  Choose one of the following that works best for you: Naproxen (Aleve, etc)  Two 216m tabs twice a day Ibuprofen (Advil, etc) Three 2072mtabs four times a day (every meal & bedtime) A  prescription for pain medication (such as percocet, oxycodone, hydrocodone, etc) should be  given to you upon discharge.  Take your pain medication as prescribed.  If you are having problems/concerns with the prescription medicine (does not control pain, nausea, vomiting, rash, itching, etc), please call [phone] see if we need to switch you to a different pain medicine that will work better for you and/or control your side effect better. If you need a refill on your pain medication, please contact your pharmacy.  They will contact our office to request authorization. Prescriptions will not be filled after 5 pm or on week-ends. KEEP YOUR BOWELS REGULAR and AVOID CONSTIPATION The goal is one to two soft bowel movements a day.  You should at least have a bowel movement every other day. Avoid getting constipated.  Between the surgery and the pain medications, it is common to experience some constipation. This can be very painful after rectal surgery.  Increasing fluid intake and taking a fiber supplement (such as Metamucil, Citrucel, FiberCon, etc) 1-2 times a day regularly will usually help prevent this problem from occurring.  A stool softener like colace is also recommended.  This can be purchased over the counter at your pharmacy.  You can take it up to 3 times a day.  If you do not have a bowel movement after 24 hrs since your surgery, take one does of milk of magnesia.  If you still haven't had a bowel movement 8-12 hours after that dose, take another dose.  If you don't have a bowel movement 48 hrs after surgery,  purchase a Fleets enema from the drug store and administer gently per package instructions.  If you still are having trouble with your bowel movements after that, please call the office for further instructions. If you develop diarrhea or have many loose bowel movements, simplify your diet to bland foods & liquids for a few days.  Stop any stool softeners and decrease your fiber supplement.  Switching to mild anti-diarrheal medications (Kayopectate, Pepto Bismol) can help.   If this worsens or does not improve, please call us.  Wound Care Remove your bandages before your first bowel movement or 8 hours after surgery.     Remove any wound packing material at this tim,e as well.  You do not need to repack the wound unless instructed otherwise.  Wear an absorbent pad or soft cotton gauze in your underwear to catch any drainage and help keep the area clean. You should change this every 2-3 hours while awake. Keep the area clean and dry.  Bathe / shower every day, especially after bowel movements.  Keep the area clean by showering / bathing over the incision / wound.   It is okay to soak an open wound to help wash it.  Wet wipes or showers / gentle washing after bowel movements is often less traumatic than regular toilet paper. You may have some styrofoam-like soft packing in the rectum which will come out with the first bowel movement.  You will often notice bleeding with bowel movements.  This should slow down by the end of the first week of surgery Expect some drainage.  This should slow down, too, by the end of the first week of surgery.  Wear an absorbent pad or soft cotton gauze in your underwear until the drainage stops. Do Not sit on a rubber or pillow ring.  This can make you symptoms worse.  You may sit on a soft pillow if needed.  ACTIVITIES as tolerated:   You may resume regular (light) daily activities beginning the next day--such as daily self-care, walking, climbing stairs--gradually increasing activities as tolerated.  If you can walk 30 minutes without difficulty, it is safe to try more intense activity such as jogging, treadmill, bicycling, low-impact aerobics, swimming, etc. Save the most intensive and strenuous activity for last such as sit-ups, heavy lifting, contact sports, etc  Refrain from any heavy lifting or straining until you are off narcotics for pain control.   You may drive when you are no longer taking prescription pain medication, you can  comfortably sit for long periods of time, and you can safely maneuver your car and apply brakes. You may have sexual intercourse when it is comfortable.  FOLLOW UP in our office Please call [facility] at [phone] to set up an appointment to see your surgeon in the office for a follow-up appointment approximately 3-4 weeks after your surgery. Make sure that you call for this appointment the day you arrive home to insure a convenient appointment time. 10. IF YOU HAVE DISABILITY OR FAMILY LEAVE FORMS, BRING THEM TO THE OFFICE FOR PROCESSING.  DO NOT GIVE THEM TO YOUR DOCTOR.     WHEN TO CALL us [phone]: Poor pain control Reactions / problems with new medications (rash/itching, nausea, etc)  Fever over 101.5 F (38.5 C) Inability to urinate Nausea and/or vomiting Worsening swelling or bruising Continued bleeding from incision. Increased pain, redness, or drainage from the incision  The clinic staff is available to answer your questions during regular business hours (8:30am-5pm).  Please don't hesitate to call and ask to speak to one of our nurses for clinical concerns.   A surgeon from [facility] is always on call at the hospitals   If you have a medical emergency, go to the nearest emergency room or call 911.    [facility], [place], [place], [place], [place]  [address] ? MAIN: [phone] ? TOLL FREE: [phone] ? FAX [phone] [email or web address]   Post Anesthesia Home Care Instructions  Activity: Get plenty of rest for the remainder of the day. A responsible individual must stay with you for 24 hours following the procedure.  For the next 24 hours, DO NOT: -Drive a car -Paediatric nurse -Drink alcoholic beverages -Take any medication unless instructed by your physician -Make any legal decisions or sign important papers.  Meals: Start with liquid foods such as gelatin or soup. Progress to regular foods  as tolerated. Avoid greasy, spicy, heavy foods. If nausea and/or vomiting occur, drink only clear liquids until the nausea and/or vomiting subsides. Call your physician if vomiting continues.  Special Instructions/Symptoms: Your throat may feel dry or sore from the anesthesia or the breathing tube placed in your throat during surgery. If this causes discomfort, gargle with warm salt water. The discomfort should disappear within 24 hours.  Information for Discharge Teaching: EXPAREL (bupivacaine liposome injectable suspension)   Your surgeon or anesthesiologist gave you EXPAREL(bupivacaine) to help control your pain after surgery.  EXPAREL is a local anesthetic that provides pain relief by numbing the tissue around the surgical site. EXPAREL is designed to release pain medication over time and can control pain for up to 72 hours. Depending on how you respond to EXPAREL, you may require less pain medication during your recovery.  Possible side effects: Temporary loss of sensation or ability to move in the area where bupivacaine was injected. Nausea, vomiting, constipation Rarely, numbness and tingling in your mouth or lips, lightheadedness, or anxiety may occur. Call your doctor right away if you think you may be experiencing any of these sensations, or if you have other questions regarding possible side effects.  Follow all other discharge instructions given to you by your surgeon or nurse. Eat a healthy diet and drink plenty of water or other fluids.  If you return to the hospital for any reason within 96 hours following the administration of EXPAREL, it is important for health care providers to know that you have received this anesthetic. A teal colored band has been placed on your arm with the date, time and amount of EXPAREL you have received in order to alert and inform your health care providers. Please leave this armband in place for the full 96 hours following administration, and then you  may remove the band.   Do not remove green armband before Tuesday, November 8,2022.  May take Tylenol as needed for pain beginning at 4 PM today.

## 2021-05-23 ENCOUNTER — Encounter (HOSPITAL_BASED_OUTPATIENT_CLINIC_OR_DEPARTMENT_OTHER): Payer: Self-pay | Admitting: General Surgery

## 2021-06-06 ENCOUNTER — Other Ambulatory Visit: Payer: Self-pay | Admitting: Cardiology

## 2021-06-06 ENCOUNTER — Telehealth: Payer: Self-pay | Admitting: Cardiology

## 2021-06-06 MED ORDER — ENTRESTO 49-51 MG PO TABS: 1.0000 | ORAL_TABLET | Freq: Two times a day (BID) | ORAL | 3 refills | Status: DC

## 2021-06-06 NOTE — Telephone Encounter (Signed)
Spoke to the patient just now. She let me know that she got her entresto from the pharmacy today and she was told by them that there were no more refills on this medication. I sent in a refill for her just now and she verbalizes understanding.    Encouraged patient to call back with any questions or concerns.

## 2021-06-06 NOTE — Telephone Encounter (Signed)
New Message:     Patient would like to talk to the nurse. She said she needs to explain what is going with her Entresto.

## 2021-08-17 ENCOUNTER — Other Ambulatory Visit: Payer: Self-pay | Admitting: Cardiology

## 2021-09-14 ENCOUNTER — Encounter: Payer: Self-pay | Admitting: Cardiology

## 2021-09-16 DIAGNOSIS — H43823 Vitreomacular adhesion, bilateral: Secondary | ICD-10-CM | POA: Insufficient documentation

## 2021-09-16 DIAGNOSIS — H33102 Unspecified retinoschisis, left eye: Secondary | ICD-10-CM | POA: Insufficient documentation

## 2021-09-29 DIAGNOSIS — Z974 Presence of external hearing-aid: Secondary | ICD-10-CM | POA: Insufficient documentation

## 2021-10-03 ENCOUNTER — Other Ambulatory Visit: Payer: Self-pay

## 2021-10-03 ENCOUNTER — Ambulatory Visit (INDEPENDENT_AMBULATORY_CARE_PROVIDER_SITE_OTHER): Payer: BC Managed Care – PPO | Admitting: Cardiology

## 2021-10-03 ENCOUNTER — Encounter: Payer: Self-pay | Admitting: Cardiology

## 2021-10-03 VITALS — BP 114/70 | HR 70 | Ht 67.0 in | Wt 192.0 lb

## 2021-10-03 DIAGNOSIS — E1165 Type 2 diabetes mellitus with hyperglycemia: Secondary | ICD-10-CM | POA: Diagnosis not present

## 2021-10-03 DIAGNOSIS — N1831 Chronic kidney disease, stage 3a: Secondary | ICD-10-CM | POA: Diagnosis not present

## 2021-10-03 DIAGNOSIS — I428 Other cardiomyopathies: Secondary | ICD-10-CM | POA: Diagnosis not present

## 2021-10-03 NOTE — Patient Instructions (Signed)
Medication Instructions:  ?Your physician recommends that you continue on your current medications as directed. Please refer to the Current Medication list given to you today. ? ? ?*If you need a refill on your cardiac medications before your next appointment, please call your pharmacy* ? ? ?Lab Work: ?NONE ?If you have labs (blood work) drawn today and your tests are completely normal, you will receive your results only by: ?MyChart Message (if you have MyChart) OR ?A paper copy in the mail ?If you have any lab test that is abnormal or we need to change your treatment, we will call you to review the results. ? ? ?Testing/Procedures: ? ?Echocardiogram ? ? ?Follow-Up: ?At [facility], you and your health needs are our priority.  As part of our continuing mission to provide you with exceptional heart care, we have created designated Provider Care Teams.  These Care Teams include your primary Cardiologist (physician) and Advanced Practice Providers (APPs -  Physician Assistants and Nurse Practitioners) who all work together to provide you with the care you need, when you need it. ? ?We recommend signing up for the patient portal called "MyChart".  Sign up information is provided on this After Visit Summary.  MyChart is used to connect with patients for Virtual Visits (Telemedicine).  Patients are able to view lab/test results, encounter notes, upcoming appointments, etc.  Non-urgent messages can be sent to your provider as well.   ?To learn more about what you can do with MyChart, go to [email or web address].   ? ?Your next appointment:   ?3 month(s) ? ?The format for your next appointment:   ?In Person ? ?Provider:   ?Jenne Campus, MD  ? ? ?Other Instructions ? ?NONE  ?

## 2021-10-03 NOTE — Progress Notes (Signed)
Cardiology Office Note:    Date:  10/03/2021   ID:  RAELEI BOUSMAN, DOB 1955-11-27, MRN [id number]  PCP:  Mick Sell, MD  Cardiologist:  Gypsy Balsam, MD    Referring MD: Mick Sell, MD   No chief complaint on file. I am doing well with my diet but I am weak and tired  History of Present Illness:    Meagan Allen is a 66 y.o. female with past medical history significant for nonischemic cardiomyopathy initial ejection fraction 30 to 35% but improved with appropriate guideline directed medical therapy to mildly diminished.  Cardiac catheterization done few years ago showed no significant coronary artery disease.  Also history of essential hypertension, diabetes. She changed her diet she is on high protein diet/keto diet.  She lost close to 50 pounds that make her feel much better.  She is to have much less requirement for insulin and she is very happy with this however she noticed her blood pressure being low and sometimes she feels very weak tired exhausted that is why she want to be seen by [place].  Described to have 1 episode of chest pain on the right side of her chest that happen when she was sitting otherwise she can walk climb stairs with some difficulty because of chronic knee pain.  Past Medical History:  Diagnosis Date   Anemia    no recent iron transfusion per pt on 05-16-2021   Anxiety    Arthritis    osteo, inflammatory arthritis and rheumatoid arthritis   Cancer (HCC)    thyroid cancer   CHF (congestive heart failure) (HCC)    diagnosed feb 2019   Deafness in right ear    Depression    Dysphagia 07/19/2020   occ   Dyspnea    on exertion on rare occasions   Enlarged liver    Expressive aphasia 03/15/2020   resolved per pt on 05-16-2021   Fibromyalgia    Gastroesophageal reflux disease with esophagitis without hemorrhage 07/19/2020   GERD (gastroesophageal reflux disease)    Headache disorder 03/15/2020   Heart murmur    High uric acid in 24  hour urine specimen 09/24/2019   History of colon polyps    History of corticosteroid therapy 07/13/2020   History of COVID-19 03/2021   mild symptoms x 5 days all symptoms resolved   History of thyroid cancer 2007;  2009   dx papillary thyroid cancer w/ mets to cervical lymph nodes   HOH (hard of hearing)    left ear   HTN (hypertension) 03/25/2012   Hypercalcemia 07/13/2020   Hyperlipidemia    Hypertension    Hypothyroidism 07/13/2020   IBS (irritable bowel syndrome)    constipation issues   Iron deficiency anemia 07/13/2020   Lesion of liver 07/19/2020   Meningioma (HCC) 03/29/2021   8 mm left frontal lobe unchanged suspected 3 mm left ant temporal lobe memingioma unchanged in size per 03-29-2021 brain mri   Migraine    Mood changes    Morbid obesity (HCC) 07/13/2020   Nonischemic cardiomyopathy [facility]) cardiologist-  dr Townsend Roger   per cardiac cath 08-28-2017  moderate LV dysfunction w/ diffuse hypocontractility ,  ef 35-40%   Optic nerve and pathway injury, left, initial encounter    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, per opthamologist black spot middle of left eye   PONV (postoperative nausea and vomiting)    last colonoscopy propfol bp dropped and vomiting   Post-surgical hypothyroidism  Primary localized osteoarthrosis of ankle and foot 02/13/2020   and hands   Rectal bleeding 05/02/2021   Rosacea    Thyroid cancer (HCC) 06/25/2006   surgery and radioactive iodine done   Type 2 diabetes mellitus (HCC)    followed by pcp   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HCC)    Wears glasses    Wears hearing aid in both ears    left ear hearing aid, microphone in right ear    Past Surgical History:  Procedure Laterality Date   CARDIOVASCULAR STRESS TEST  08-20-2017   dr Bing Matter   High risk nuclear study w/ large irreverisible defect in the basal and mid inferoseptal, inferior, inferolateral walls and apical septal, inferior walls with small peri infarct ischemia in the  apical anterior & lateral walls (consistant w/ prior MI )/  no ST segment deviation noted /  nuclear stress ef 36%/  recommended cardiac cath   CARPOMETACARPAL [facility]) FUSION OF THUMB Bilateral 2003   COLONOSCOPY WITH PROPOFOL N/A 05/06/2013   Procedure: COLONOSCOPY WITH PROPOFOL;  Surgeon: Charolett Bumpers, MD;  Location: [facility];  Service: Endoscopy;  Laterality: N/A;   D & C HYSTEROSCOPY /  RESECTION POLYP/ ROLLER BALL ABLATION  02-26-2004   dr Carey Bullocks  [facility]   DILATION AND CURETTAGE OF UTERUS  1984   EXCISION MORTON'S NEUROMA Left 1996   giant cell tumor  2011   Left ankle 2011   JOINT REPLACEMENT Left 05/2020   2 [facility] [place]   KNEE ARTHROSCOPY  08-16-2010  dr Lestine Box  [facility];  05/ 2012   right x2 left x1   LEFT HEART CATH AND CORONARY ANGIOGRAPHY N/A 08/28/2017   Procedure: LEFT HEART CATH AND CORONARY ANGIOGRAPHY;  Surgeon: Lennette Bihari, MD;  Location: [facility] INVASIVE CV LAB;  Service: Cardiovascular;  Laterality: N/A;   large normal coronary arteries in a dominant RCA system;  moderate LV systoic dysfunction w/ diffuse hypocontractility (compatible w/ nonischemic cardiomyopathy), LV end diastoilc pressure normal, LVEF 35-45% by visual estimate   Left knee replaced   05/2010   NASAL LACRIMAL DUCT SURGERY  06/14/2009   REDO RIGHT MODIFIED RADICAL NECK DISSECTION  08-29-2007    [facility]   SHOULDER ARTHROSCOPY WITH ROTATOR CUFF REPAIR AND SUBACROMIAL DECOMPRESSION Left 10/17/2017   Procedure: Left shoulder mini open rotator cuff repair, subacromial decompression;  Surgeon: Jene Every, MD;  Location: [facility] ORS;  Service: Orthopedics;  Laterality: Left;  Interscalene Block   TOTAL KNEE ARTHROPLASTY Right 06/03/2018   Procedure: RIGHT TOTAL KNEE ARTHROPLASTY;  Surgeon: Dannielle Huh, MD;  Location: [facility];  Service: Orthopedics;  Laterality: Right;   TOTAL THYROIDECTOMY  2007  -- [facility]   w/ dissection lymph nodes   TRANSANAL HEMORRHOIDAL DEARTERIALIZATION N/A 05/20/2021   Procedure:  TRANSANAL HEMORRHOIDAL DEARTERIALIZATION;  Surgeon: Romie Levee, MD;  Location: [facility];  Service: General;  Laterality: N/A;   TRANSTHORACIC ECHOCARDIOGRAM  08-20-2017  dr Bing Matter   ef 25-30%, severe diffuse hypokinesis with no identifiable regional variations, but with profound dyssynchrony, due to arrhythmia insuffient to evaluate LV diastolic dysfunction/  trivial MR/  mild LAE/ Ventricular septum motion abnormal funtion,dyssynergy, & paradox   WRIST SURGERY Right 2001    Current Medications: Current Meds  Medication Sig   Allopurinol 200 MG TABS Take 200 mg by mouth daily.   aspirin EC 81 MG tablet Take 81 mg by mouth daily.   aspirin-acetaminophen-caffeine (EXCEDRIN MIGRAINE) 250-250-65 MG tablet Take by mouth every 6 (six) hours as needed for  headache.   buPROPion (WELLBUTRIN XL) 150 MG 24 hr tablet Take 450 mg by mouth every morning.    Calcium-Magnesium-Vitamin D (CALCIUM 1200+D3 PO) Take 1 tablet by mouth 2 (two) times daily.   cetirizine (ZYRTEC) 10 MG tablet Take 10 mg by mouth daily.   Cholecalciferol (VITAMIN D) 50 MCG (2000 UT) CAPS Take 1 capsule by mouth daily.   cinacalcet (SENSIPAR) 30 MG tablet Take 30 mg by mouth daily.   clindamycin (CLEOCIN T) 1 % external solution Apply 1 application topically 2 (two) times daily.   clonazePAM (KLONOPIN) 0.5 MG tablet Take 0.5 mg by mouth at bedtime.   CONTOUR NEXT TEST test strip 1 each by Other route as needed for other (Glucose test).   doxycycline (VIBRAMYCIN) 100 MG capsule Take 100 mg by mouth as directed.   furosemide (LASIX) 20 MG tablet Take 1 tablet (20 mg total) by mouth daily.   HUMIRA PEN 40 MG/0.4ML PNKT Inject 40 mg into the skin every 14 (fourteen) days.   mesalamine (LIALDA) 1.2 g EC tablet Take 1.2 g by mouth daily with breakfast.   metFORMIN (GLUCOPHAGE-XR) 500 MG 24 hr tablet Take 1,000 mg by mouth every evening.   metoprolol succinate (TOPROL-XL) 100 MG 24 hr tablet TAKE 1 TABLET BY MOUTH  DAILY WITH OR IMMEDIATELY FOLLOWING A MEAL. (Patient taking differently: Take 100 mg by mouth daily.)   Misc Natural Products (COSAMIN ASU ADVANCED FORMULA) CAPS Take 2 tablets by mouth daily. Unknown strength   nystatin (MYCOSTATIN/NYSTOP) powder Apply 1 application topically as needed (yeast).   pantoprazole (PROTONIX) 40 MG tablet Take 40 mg by mouth daily.   polyethylene glycol powder (GLYCOLAX/MIRALAX) powder Take 17 g by mouth daily as needed for mild constipation or moderate constipation (for constipation.).   rosuvastatin (CRESTOR) 5 MG tablet Take 5 mg by mouth 3 (three) times a week.   sacubitril-valsartan (ENTRESTO) 49-51 MG Take 1 tablet by mouth 2 (two) times daily.   SYNTHROID 112 MCG tablet Take 112 mcg by mouth daily.   TRULANCE 3 MG TABS Take 1 tablet by mouth daily.   TRULICITY 1.5 MG/0.5ML SOPN Inject 0.5 mLs as directed once a week.     Allergies:   Hydroxychloroquine, Sulfa antibiotics, Bydureon [exenatide], Gabapentin, Invokana [canagliflozin], Jardiance [empagliflozin], Lisinopril, Methotrexate, Other, Pravastatin, and Propofol   Social History   Socioeconomic History   Marital status: Married    Spouse name: Fayrene Fearing   Number of children: 2   Years of education: 12   Highest education level: Not on file  Occupational History   Occupation: Production manager for [employer]    Employer: [employer]  Tobacco Use   Smoking status: Former    Packs/day: 3.00    Years: 10.00    Pack years: 30.00    Types: Cigarettes    Quit date: 07/17/1980    Years since quitting: 41.2   Smokeless tobacco: Never  Vaping Use   Vaping Use: Never used  Substance and Sexual Activity   Alcohol use: Yes    Comment: very occ   Drug use: No   Sexual activity: Yes    Birth control/protection: Post-menopausal  Other Topics Concern   Not on file  Social History Narrative   Juanetta was born in [place], [place]. She moved to [place] in 1978 when her family moved to this  state. Huda currently lives in [place] with her husband of 32 years. They have 2 adult children and 1 grandson. 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 She enjoys gardening.   Social Determinants of Health   Financial Resource Strain: Not on file  Food Insecurity: Not on file  Transportation Needs: Not on file  Physical Activity: Not on file  Stress: Not on file  Social Connections: Not on file     Family History: The patient's family history includes Arthritis in her mother; Asthma in her son; Cancer in her brother; Depression in her mother; Diabetes in her father; Gout in her brother, brother, and brother; Heart disease in her father and sister; Hypertension in her father; Stroke in her mother. There is no history of Breast cancer. ROS:   Please see the history of present illness.    All 14 point review of systems negative except as described per history of present illness  EKGs/Labs/Other Studies Reviewed:      Recent Labs: 05/20/2021: BUN 17; Creatinine, Ser 0.80; Hemoglobin 11.9; Potassium 3.8; Sodium 140  Recent Lipid Panel    Component Value Date/Time   CHOL 192 03/27/2018 0937   TRIG 183 (H) 03/27/2018 0937   HDL 41 03/27/2018 0937   CHOLHDL 4.7 (H) 03/27/2018 0937   LDLCALC 114 (H) 03/27/2018 4098    Physical Exam:    VS:  BP 114/70 (BP Location: Left Arm)   Pulse 70   Ht 5\' 7"  (1.702 m)   Wt 192 lb (87.1 kg)   SpO2 97%   BMI 30.07 kg/m     Wt Readings from Last 3 Encounters:  10/03/21 192 lb (87.1 kg)  05/20/21 215 lb 4.8 oz (97.7 kg)  03/28/21 228 lb (103.4 kg)     GEN:  Well nourished, well developed in no acute distress HEENT: Normal NECK: No JVD; No carotid bruits LYMPHATICS: No lymphadenopathy CARDIAC: RRR, no murmurs, no rubs, no gallops RESPIRATORY:  Clear to auscultation without rales, wheezing or rhonchi  ABDOMEN: Soft, non-tender, non-distended MUSCULOSKELETAL:  No edema; No deformity  SKIN: Warm and dry LOWER EXTREMITIES:  no swelling NEUROLOGIC:  Alert and oriented x 3 PSYCHIATRIC:  Normal affect   ASSESSMENT:    1. Nonischemic cardiomyopathy (HCC)   2. Type 2 diabetes mellitus with hyperglycemia, without long-term current use of insulin (HCC)   3. Chronic kidney disease, stage 3a (HCC)    PLAN:    In order of problems listed above:  Nonischemic cardiomyopathy on guideline directed medical therapy.  I will ask her to have echocardiogram done to recheck left ventricle ejection fraction in the meantime I will not change any of her medications. Type 2 diabetes much improved for now with significant weight loss.  Her hemoglobin A1c was 6.6 however this data is from 2021, data done by primary care physician done 2 months ago was 5.9. Dyslipidemia.  In spite of high protein high fat diet her LDL is 87 HDL 49.4.  No significant changes compared to before.  We did talk in length about diet that she is on prescription modified slightly to diet apparently she eats a lot of animal fat I told her that is probably not the best idea in my.  She should eliminate dead fat and use fat coming from plants regardless she is feeling better and she is doing better.  We will continue present management.  Echocardiogram will be done I will see her back in about 3 months   Medication Adjustments/Labs and Tests Ordered: Current medicines are reviewed at length with the patient today.  Concerns regarding medicines are outlined above.  No orders of  the defined types were placed in this encounter.  Medication changes: No orders of the defined types were placed in this encounter.   Signed, Georgeanna Lea, MD, [facility] 10/03/2021 3:10 PM    [facility]

## 2021-10-17 ENCOUNTER — Ambulatory Visit (HOSPITAL_BASED_OUTPATIENT_CLINIC_OR_DEPARTMENT_OTHER)
Admission: RE | Admit: 2021-10-17 | Discharge: 2021-10-17 | Disposition: A | Discharge status: Home / Self Care | Payer: BC Managed Care – PPO | Source: Ambulatory Visit | Attending: Cardiology | Admitting: Cardiology

## 2021-10-17 DIAGNOSIS — E1165 Type 2 diabetes mellitus with hyperglycemia: Secondary | ICD-10-CM | POA: Insufficient documentation

## 2021-10-17 DIAGNOSIS — R0609 Other forms of dyspnea: Secondary | ICD-10-CM

## 2021-10-17 DIAGNOSIS — N1831 Chronic kidney disease, stage 3a: Secondary | ICD-10-CM | POA: Insufficient documentation

## 2021-10-17 DIAGNOSIS — I428 Other cardiomyopathies: Secondary | ICD-10-CM | POA: Diagnosis present

## 2021-10-17 LAB — ECHOCARDIOGRAM COMPLETE
AV Mean grad: 4 mmHg
AV Peak grad: 6.4 mmHg
Ao pk vel: 1.26 m/s
Area-P 1/2: 4.19 cm2
S' Lateral: 3.4 cm

## 2021-10-17 NOTE — Progress Notes (Signed)
?  Echocardiogram ?2D Echocardiogram has been performed. ? ?Meagan Allen ?10/17/2021, 10:38 AM ?

## 2021-11-11 ENCOUNTER — Other Ambulatory Visit: Payer: Self-pay | Admitting: Infectious Diseases

## 2021-11-11 DIAGNOSIS — R1011 Right upper quadrant pain: Secondary | ICD-10-CM

## 2021-11-17 ENCOUNTER — Ambulatory Visit
Admission: RE | Admit: 2021-11-17 | Discharge: 2021-11-17 | Disposition: A | Discharge status: Home / Self Care | Payer: BC Managed Care – PPO | Source: Ambulatory Visit | Attending: Infectious Diseases | Admitting: Infectious Diseases

## 2021-11-17 DIAGNOSIS — R1011 Right upper quadrant pain: Secondary | ICD-10-CM

## 2021-11-17 MED ORDER — IOPAMIDOL (ISOVUE-300) INJECTION 61%
100.0000 mL | Freq: Once | INTRAVENOUS | Status: AC | PRN
  Administered 2021-11-17: 100 mL via INTRAVENOUS

## 2021-11-21 ENCOUNTER — Encounter: Payer: Self-pay | Admitting: Cardiology

## 2021-12-19 DIAGNOSIS — I7090 Unspecified atherosclerosis: Secondary | ICD-10-CM | POA: Insufficient documentation

## 2022-02-13 ENCOUNTER — Other Ambulatory Visit: Payer: Self-pay | Admitting: Infectious Diseases

## 2022-02-13 DIAGNOSIS — Z1231 Encounter for screening mammogram for malignant neoplasm of breast: Secondary | ICD-10-CM

## 2022-02-16 ENCOUNTER — Other Ambulatory Visit: Payer: Self-pay | Admitting: Neurology

## 2022-02-16 DIAGNOSIS — Z86011 Personal history of benign neoplasm of the brain: Secondary | ICD-10-CM

## 2022-03-01 ENCOUNTER — Ambulatory Visit
Admission: RE | Admit: 2022-03-01 | Discharge: 2022-03-01 | Disposition: A | Discharge status: Home / Self Care | Payer: BC Managed Care – PPO | Source: Ambulatory Visit | Attending: Neurology | Admitting: Neurology

## 2022-03-01 DIAGNOSIS — Z86011 Personal history of benign neoplasm of the brain: Secondary | ICD-10-CM

## 2022-03-01 MED ORDER — GADOBENATE DIMEGLUMINE 529 MG/ML IV SOLN
17.0000 mL | Freq: Once | INTRAVENOUS | Status: AC | PRN
  Administered 2022-03-01: 17 mL via INTRAVENOUS

## 2022-03-02 ENCOUNTER — Encounter: Payer: Self-pay | Admitting: Cardiology

## 2022-03-02 ENCOUNTER — Ambulatory Visit (INDEPENDENT_AMBULATORY_CARE_PROVIDER_SITE_OTHER): Payer: BC Managed Care – PPO | Admitting: Cardiology

## 2022-03-02 VITALS — BP 120/68 | HR 60 | Ht 67.0 in | Wt 183.0 lb

## 2022-03-02 DIAGNOSIS — R278 Other lack of coordination: Secondary | ICD-10-CM

## 2022-03-02 DIAGNOSIS — I10 Essential (primary) hypertension: Secondary | ICD-10-CM | POA: Diagnosis not present

## 2022-03-02 DIAGNOSIS — I428 Other cardiomyopathies: Secondary | ICD-10-CM

## 2022-03-02 DIAGNOSIS — D329 Benign neoplasm of meninges, unspecified: Secondary | ICD-10-CM | POA: Diagnosis not present

## 2022-03-02 NOTE — Patient Instructions (Signed)

## 2022-03-02 NOTE — Progress Notes (Signed)
Cardiology Office Note:    Date:  03/02/2022   ID:  Meagan Allen, DOB 1956-03-31, MRN [id number]  PCP:  Leonel Ramsay, MD  Cardiologist:  Jenne Campus, MD    Referring MD: Leonel Ramsay, MD   No chief complaint on file. I am dizz  History of Present Illness:   Meagan Allen is a 66 y.o. female with past medical history significant for nonischemic cardiomyopathy initial ejection fraction 30 to 35% but improved with appropriate guideline directed medical therapy to mildly diminished.  Cardiac catheterization done few years ago showed no significant coronary artery disease.  Also history of essential hypertension, diabetes. Recently she started experiencing much more dizziness and unsteadiness than before.  She was diagnosed with sensory ataxia and actually neurologist aggressively pursuing this diagnosis trying to explain what is going on what can be done about it.  Cardiac wise doing well.  Denies have any chest pain tightness squeezing pressure burning chest.  She maintaining her weight loss time she is feeling better because of this but disappointed with dizziness and the fact that she cannot walk straight.  Does not have any dizziness when she is sitting or when she is laying down.  Past Medical History:  Diagnosis Date   Anemia    no recent iron transfusion per pt on 05-16-2021   Anxiety    Arthritis    osteo, inflammatory arthritis and rheumatoid arthritis   Cancer (HCC)    thyroid cancer   CHF (congestive heart failure) ([place])    diagnosed feb 2019   Deafness in right ear    Depression    Dysphagia 07/19/2020   occ   Dyspnea    on exertion on rare occasions   Enlarged liver    Expressive aphasia 03/15/2020   resolved per pt on 05-16-2021   Fibromyalgia    Gastroesophageal reflux disease with esophagitis without hemorrhage 07/19/2020   GERD (gastroesophageal reflux disease)    Headache disorder 03/15/2020   Heart murmur    High uric acid in 24 hour  urine specimen 09/24/2019   History of colon polyps    History of corticosteroid therapy 07/13/2020   History of COVID-19 03/2021   mild symptoms x 5 days all symptoms resolved   History of thyroid cancer 2007;  2009   dx papillary thyroid cancer w/ mets to cervical lymph nodes   HOH (hard of hearing)    left ear   HTN (hypertension) 03/25/2012   Hypercalcemia 07/13/2020   Hyperlipidemia    Hypertension    Hypothyroidism 07/13/2020   IBS (irritable bowel syndrome)    constipation issues   Iron deficiency anemia 07/13/2020   Lesion of liver 07/19/2020   Meningioma ([place]) 03/29/2021   8 mm left frontal lobe unchanged suspected 3 mm left ant temporal lobe memingioma unchanged in size per 03-29-2021 brain mri   Migraine    Mood changes    Morbid obesity ([place]) 07/13/2020   Nonischemic cardiomyopathy [facility]) cardiologist-  dr Edyth Gunnels   per cardiac cath 08-28-2017  moderate LV dysfunction w/ diffuse hypocontractility ,  ef 35-40%   Optic nerve and pathway injury, left, initial encounter    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, per opthamologist black spot middle of left eye   PONV (postoperative nausea and vomiting)    last colonoscopy propfol bp dropped and vomiting   Post-surgical hypothyroidism    Primary localized osteoarthrosis of ankle and foot 02/13/2020   and hands   Rectal bleeding 05/02/2021  Rosacea    Thyroid cancer ([place]) 06/25/2006   surgery and radioactive iodine done   Type 2 diabetes mellitus ([place])    followed by pcp   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[place])    Wears glasses    Wears hearing aid in both ears    left ear hearing aid, microphone in right ear    Past Surgical History:  Procedure Laterality Date   CARDIOVASCULAR STRESS TEST  08-20-2017   dr Agustin Cree   High risk nuclear study w/ large irreverisible defect in the basal and mid inferoseptal, inferior, inferolateral walls and apical septal, inferior walls with small peri infarct ischemia in the apical  anterior & lateral walls (consistant w/ prior MI )/  no ST segment deviation noted /  nuclear stress ef 36%/  recommended cardiac cath   CARPOMETACARPAL [facility]) FUSION OF THUMB Bilateral 2003   COLONOSCOPY WITH PROPOFOL N/A 05/06/2013   Procedure: COLONOSCOPY WITH PROPOFOL;  Surgeon: Garlan Fair, MD;  Location: [facility];  Service: Endoscopy;  Laterality: N/A;   D & C HYSTEROSCOPY /  RESECTION POLYP/ ROLLER BALL ABLATION  02-26-2004   dr Kathyrn Drown  [facility]   DILATION AND CURETTAGE OF UTERUS  1984   EXCISION MORTON'S NEUROMA Left 1996   giant cell tumor  2011   Left ankle 2011   JOINT REPLACEMENT Left 05/2020   2 [facility] [place]   KNEE ARTHROSCOPY  08-16-2010  dr Beola Cord  [facility];  05/ 2012   right x2 left x1   LEFT HEART CATH AND CORONARY ANGIOGRAPHY N/A 08/28/2017   Procedure: LEFT HEART CATH AND CORONARY ANGIOGRAPHY;  Surgeon: Troy Sine, MD;  Location: [facility];  Service: Cardiovascular;  Laterality: N/A;   large normal coronary arteries in a dominant RCA system;  moderate LV systoic dysfunction w/ diffuse hypocontractility (compatible w/ nonischemic cardiomyopathy), LV end diastoilc pressure normal, LVEF 35-45% by visual estimate   Left knee replaced   05/2010   NASAL LACRIMAL DUCT SURGERY  06/14/2009   REDO RIGHT MODIFIED RADICAL NECK DISSECTION  08-29-2007    [facility]   SHOULDER ARTHROSCOPY WITH ROTATOR CUFF REPAIR AND SUBACROMIAL DECOMPRESSION Left 10/17/2017   Procedure: Left shoulder mini open rotator cuff repair, subacromial decompression;  Surgeon: [name], MD;  Location: [facility];  Service: Orthopedics;  Laterality: Left;  Interscalene Block   TOTAL KNEE ARTHROPLASTY Right 06/03/2018   Procedure: RIGHT TOTAL KNEE ARTHROPLASTY;  Surgeon: Vickey Huger, MD;  Location: [facility];  Service: Orthopedics;  Laterality: Right;   TOTAL THYROIDECTOMY  2007  -- [facility]   w/ dissection lymph nodes   TRANSANAL HEMORRHOIDAL DEARTERIALIZATION N/A 05/20/2021   Procedure:  TRANSANAL HEMORRHOIDAL DEARTERIALIZATION;  Surgeon: Leighton Ruff, MD;  Location: [facility];  Service: General;  Laterality: N/A;   TRANSTHORACIC ECHOCARDIOGRAM  08-20-2017  dr Agustin Cree   ef 25-30%, severe diffuse hypokinesis with no identifiable regional variations, but with profound dyssynchrony, due to arrhythmia insuffient to evaluate LV diastolic dysfunction/  trivial MR/  mild LAE/ Ventricular septum motion abnormal funtion,dyssynergy, & paradox   WRIST SURGERY Right 2001    Current Medications: Current Meds  Medication Sig   Allopurinol 200 MG TABS Take 200 mg by mouth daily.   aspirin EC 81 MG tablet Take 81 mg by mouth daily.   aspirin-acetaminophen-caffeine (EXCEDRIN MIGRAINE) 250-250-65 MG tablet Take by mouth every 6 (six) hours as needed for headache.   buPROPion (WELLBUTRIN XL) 150 MG 24 hr tablet Take 450 mg by mouth every morning.  Calcium-Magnesium-Vitamin D (CALCIUM 1200+D3 PO) Take 1 tablet by mouth 2 (two) times daily.   cetirizine (ZYRTEC) 10 MG tablet Take 10 mg by mouth daily.   Cholecalciferol (VITAMIN D) 50 MCG (2000 UT) CAPS Take 1 capsule by mouth daily.   cinacalcet (SENSIPAR) 30 MG tablet Take 30 mg by mouth daily.   clindamycin (CLEOCIN T) 1 % external solution Apply 1 application topically 2 (two) times daily.   clonazePAM (KLONOPIN) 0.5 MG tablet Take 0.5 mg by mouth at bedtime.   CONTOUR NEXT TEST test strip 1 each by Other route as needed for other (Glucose test).   doxycycline (VIBRAMYCIN) 100 MG capsule Take 100 mg by mouth as directed.   furosemide (LASIX) 20 MG tablet Take 1 tablet (20 mg total) by mouth daily.   HUMIRA PEN 40 MG/0.4ML PNKT Inject 40 mg into the skin every 14 (fourteen) days.   mesalamine (LIALDA) 1.2 g EC tablet Take 1.2 g by mouth daily with breakfast.   metFORMIN (GLUCOPHAGE-XR) 500 MG 24 hr tablet Take 1,000 mg by mouth every evening.   metoprolol succinate (TOPROL-XL) 100 MG 24 hr tablet TAKE 1 TABLET BY MOUTH  DAILY WITH OR IMMEDIATELY FOLLOWING A MEAL. (Patient taking differently: Take 100 mg by mouth daily.)   Misc Natural Products (COSAMIN ASU ADVANCED FORMULA) CAPS Take 2 tablets by mouth daily. Unknown strength   nystatin (MYCOSTATIN/NYSTOP) powder Apply 1 application topically as needed (yeast).   pantoprazole (PROTONIX) 40 MG tablet Take 40 mg by mouth daily.   polyethylene glycol powder (GLYCOLAX/MIRALAX) powder Take 17 g by mouth daily as needed for mild constipation or moderate constipation (for constipation.).   rosuvastatin (CRESTOR) 5 MG tablet Take 5 mg by mouth 3 (three) times a week.   sacubitril-valsartan (ENTRESTO) 49-51 MG Take 1 tablet by mouth 2 (two) times daily.   SYNTHROID 112 MCG tablet Take 112 mcg by mouth daily.   TRULANCE 3 MG TABS Take 1 tablet by mouth daily.   TRULICITY 1.5 SL/3.7DS SOPN Inject 0.5 mLs as directed once a week.     Allergies:   Hydroxychloroquine, Sulfa antibiotics, Ace inhibitors, Canagliflozin, Empagliflozin, Exenatide, Gabapentin, Lisinopril, Methotrexate, Other, Pravastatin, and Propofol   Social History   Socioeconomic History   Marital status: Married    Spouse name: Jeneen Rinks   Number of children: 2   Years of education: 12   Highest education level: Not on file  Occupational History   Occupation: Banker for [employer]  Tobacco Use   Smoking status: Former    Packs/day: 3.00    Years: 10.00    Total pack years: 30.00    Types: Cigarettes    Quit date: 07/17/1980    Years since quitting: 41.6    Passive exposure: Past   Smokeless tobacco: Never  Vaping Use   Vaping Use: Never used  Substance and Sexual Activity   Alcohol use: Yes    Comment: very occ   Drug use: No   Sexual activity: Yes    Birth control/protection: Post-menopausal  Other Topics Concern   Not on file  Social History Narrative   Semiyah was born in [place], [place]. She moved to [place] in 1978 when her family  moved to this state. Elmarie currently lives in [place] with her husband of 89 years. They have 2 adult children and 1 grandson. 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   Social Determinants of Health  Financial Resource Strain: Not on file  Food Insecurity: Not on file  Transportation Needs: Not on file  Physical Activity: Not on file  Stress: Not on file  Social Connections: Not on file     Family History: The patient's family history includes Arthritis in her mother; Asthma in her son; Cancer in her brother; Depression in her mother; Diabetes in her father; Gout in her brother, brother, and brother; Heart disease in her father and sister; Hypertension in her father; Stroke in her mother. There is no history of Breast cancer. ROS:   Please see the history of present illness.    All 14 point review of systems negative except as described per history of present illness  EKGs/Labs/Other Studies Reviewed:      Recent Labs: 05/20/2021: BUN 17; Creatinine, Ser 0.80; Hemoglobin 11.9; Potassium 3.8; Sodium 140  Recent Lipid Panel    Component Value Date/Time   CHOL 192 03/27/2018 0937   TRIG 183 (H) 03/27/2018 0937   HDL 41 03/27/2018 0937   CHOLHDL 4.7 (H) 03/27/2018 0937   LDLCALC 114 (H) 03/27/2018 0626    Physical Exam:    VS:  BP 120/68 (BP Location: Left Arm, Patient Position: Sitting)   Pulse 60   Ht 5' 7"  (1.702 m)   Wt 183 lb (83 kg)   SpO2 97%   BMI 28.66 kg/m     Wt Readings from Last 3 Encounters:  03/02/22 183 lb (83 kg)  10/03/21 192 lb (87.1 kg)  05/20/21 215 lb 4.8 oz (97.7 kg)     GEN:  Well nourished, well developed in no acute distress HEENT: Normal NECK: No JVD; No carotid bruits LYMPHATICS: No lymphadenopathy CARDIAC: RRR, no murmurs, no rubs, no gallops RESPIRATORY:  Clear to auscultation without rales, wheezing or rhonchi  ABDOMEN: Soft, non-tender, non-distended MUSCULOSKELETAL:  No edema; No deformity   SKIN: Warm and dry LOWER EXTREMITIES: no swelling NEUROLOGIC:  Alert and oriented x 3 PSYCHIATRIC:  Normal affect   ASSESSMENT:    1. Nonischemic cardiomyopathy ([place])   2. Primary hypertension   3. Meningioma ([place])   4. Sensory ataxia    PLAN:    In order of problems listed above:  Nonischemic cardiomyopathy echocardiogram reviewed ejection fraction 5055% she is on appropriate guideline directed medical therapy which I will continue. Essential hypertension: Blood pressure well controlled continue present management. He still for meningioma yesterday she had MRI of the brain done we will wait for results of it Dyslipidemia I did have recent fasting lipid profile done she is on Crestor which I will continue.   Medication Adjustments/Labs and Tests Ordered: Current medicines are reviewed at length with the patient today.  Concerns regarding medicines are outlined above.  No orders of the defined types were placed in this encounter.  Medication changes: No orders of the defined types were placed in this encounter.   Signed, Park Liter, MD, [facility] 03/02/2022 [facility]

## 2022-04-11 ENCOUNTER — Ambulatory Visit
Admission: RE | Admit: 2022-04-11 | Discharge: 2022-04-11 | Disposition: A | Discharge status: Home / Self Care | Payer: BC Managed Care – PPO | Source: Ambulatory Visit | Attending: Infectious Diseases | Admitting: Infectious Diseases

## 2022-04-11 DIAGNOSIS — Z1231 Encounter for screening mammogram for malignant neoplasm of breast: Secondary | ICD-10-CM

## 2022-05-09 ENCOUNTER — Other Ambulatory Visit
Admission: RE | Admit: 2022-05-09 | Discharge: 2022-05-09 | Disposition: A | Discharge status: Home / Self Care | Payer: BC Managed Care – PPO | Source: Ambulatory Visit | Attending: Ophthalmology | Admitting: Ophthalmology

## 2022-05-09 DIAGNOSIS — M316 Other giant cell arteritis: Secondary | ICD-10-CM | POA: Diagnosis present

## 2022-05-09 LAB — PLATELET COUNT: Platelets: 260 10*3/uL (ref 150–400)

## 2022-05-09 LAB — SEDIMENTATION RATE: Sed Rate: 51 mm/hr — ABNORMAL HIGH (ref 0–30)

## 2022-05-09 LAB — C-REACTIVE PROTEIN: CRP: 1.2 mg/dL — ABNORMAL HIGH (ref <1.0)

## 2022-05-25 ENCOUNTER — Encounter: Payer: Self-pay | Admitting: Cardiology

## 2022-05-26 ENCOUNTER — Encounter (HOSPITAL_BASED_OUTPATIENT_CLINIC_OR_DEPARTMENT_OTHER): Payer: Self-pay | Admitting: Emergency Medicine

## 2022-05-26 ENCOUNTER — Other Ambulatory Visit: Payer: Self-pay

## 2022-05-26 ENCOUNTER — Emergency Department (HOSPITAL_BASED_OUTPATIENT_CLINIC_OR_DEPARTMENT_OTHER): Payer: BC Managed Care – PPO

## 2022-05-26 ENCOUNTER — Observation Stay (HOSPITAL_BASED_OUTPATIENT_CLINIC_OR_DEPARTMENT_OTHER)
Admission: EM | Admit: 2022-05-26 | Discharge: 2022-05-27 | Disposition: A | Discharge status: Home / Self Care | Payer: BC Managed Care – PPO | Attending: Family Medicine | Admitting: Family Medicine

## 2022-05-26 ENCOUNTER — Encounter (HOSPITAL_COMMUNITY): Payer: Self-pay

## 2022-05-26 DIAGNOSIS — N1832 Chronic kidney disease, stage 3b: Secondary | ICD-10-CM | POA: Diagnosis not present

## 2022-05-26 DIAGNOSIS — E119 Type 2 diabetes mellitus without complications: Secondary | ICD-10-CM

## 2022-05-26 DIAGNOSIS — I130 Hypertensive heart and chronic kidney disease with heart failure and stage 1 through stage 4 chronic kidney disease, or unspecified chronic kidney disease: Secondary | ICD-10-CM | POA: Diagnosis not present

## 2022-05-26 DIAGNOSIS — Z87891 Personal history of nicotine dependence: Secondary | ICD-10-CM | POA: Diagnosis not present

## 2022-05-26 DIAGNOSIS — Z7982 Long term (current) use of aspirin: Secondary | ICD-10-CM | POA: Insufficient documentation

## 2022-05-26 DIAGNOSIS — R001 Bradycardia, unspecified: Secondary | ICD-10-CM | POA: Diagnosis not present

## 2022-05-26 DIAGNOSIS — I10 Essential (primary) hypertension: Secondary | ICD-10-CM | POA: Diagnosis present

## 2022-05-26 DIAGNOSIS — F419 Anxiety disorder, unspecified: Secondary | ICD-10-CM | POA: Diagnosis present

## 2022-05-26 DIAGNOSIS — E039 Hypothyroidism, unspecified: Secondary | ICD-10-CM | POA: Insufficient documentation

## 2022-05-26 DIAGNOSIS — I428 Other cardiomyopathies: Secondary | ICD-10-CM

## 2022-05-26 DIAGNOSIS — R55 Syncope and collapse: Secondary | ICD-10-CM | POA: Diagnosis not present

## 2022-05-26 DIAGNOSIS — I509 Heart failure, unspecified: Secondary | ICD-10-CM | POA: Insufficient documentation

## 2022-05-26 DIAGNOSIS — Z7984 Long term (current) use of oral hypoglycemic drugs: Secondary | ICD-10-CM | POA: Diagnosis not present

## 2022-05-26 DIAGNOSIS — Z79899 Other long term (current) drug therapy: Secondary | ICD-10-CM | POA: Insufficient documentation

## 2022-05-26 DIAGNOSIS — N179 Acute kidney failure, unspecified: Secondary | ICD-10-CM | POA: Diagnosis not present

## 2022-05-26 DIAGNOSIS — R079 Chest pain, unspecified: Secondary | ICD-10-CM | POA: Insufficient documentation

## 2022-05-26 DIAGNOSIS — Z8616 Personal history of COVID-19: Secondary | ICD-10-CM | POA: Diagnosis not present

## 2022-05-26 DIAGNOSIS — Z96651 Presence of right artificial knee joint: Secondary | ICD-10-CM | POA: Insufficient documentation

## 2022-05-26 DIAGNOSIS — Z85850 Personal history of malignant neoplasm of thyroid: Secondary | ICD-10-CM | POA: Insufficient documentation

## 2022-05-26 DIAGNOSIS — E1122 Type 2 diabetes mellitus with diabetic chronic kidney disease: Secondary | ICD-10-CM | POA: Insufficient documentation

## 2022-05-26 DIAGNOSIS — E785 Hyperlipidemia, unspecified: Secondary | ICD-10-CM | POA: Diagnosis present

## 2022-05-26 DIAGNOSIS — K219 Gastro-esophageal reflux disease without esophagitis: Secondary | ICD-10-CM | POA: Diagnosis present

## 2022-05-26 DIAGNOSIS — F32A Depression, unspecified: Secondary | ICD-10-CM | POA: Diagnosis present

## 2022-05-26 LAB — BASIC METABOLIC PANEL
Anion gap: 10 (ref 5–15)
BUN: 37 mg/dL — ABNORMAL HIGH (ref 8–23)
CO2: 22 mmol/L (ref 22–32)
Calcium: 9.2 mg/dL (ref 8.9–10.3)
Chloride: 104 mmol/L (ref 98–111)
Creatinine, Ser: 1.58 mg/dL — ABNORMAL HIGH (ref 0.44–1.00)
GFR, Estimated: 36 mL/min — ABNORMAL LOW (ref >60)
Glucose, Bld: 241 mg/dL — ABNORMAL HIGH (ref 70–99)
Potassium: 4.2 mmol/L (ref 3.5–5.1)
Sodium: 136 mmol/L (ref 135–145)

## 2022-05-26 LAB — CBC WITH DIFFERENTIAL/PLATELET
Abs Immature Granulocytes: 0.05 10*3/uL (ref 0.00–0.07)
Basophils Absolute: 0 10*3/uL (ref 0.0–0.1)
Basophils Relative: 0 %
Eosinophils Absolute: 0 10*3/uL (ref 0.0–0.5)
Eosinophils Relative: 0 %
HCT: 37.9 % (ref 36.0–46.0)
Hemoglobin: 13 g/dL (ref 12.0–15.0)
Immature Granulocytes: 1 %
Lymphocytes Relative: 16 %
Lymphs Abs: 1.7 10*3/uL (ref 0.7–4.0)
MCH: 31.9 pg (ref 26.0–34.0)
MCHC: 34.3 g/dL (ref 30.0–36.0)
MCV: 93.1 fL (ref 80.0–100.0)
Monocytes Absolute: 0.2 10*3/uL (ref 0.1–1.0)
Monocytes Relative: 2 %
Neutro Abs: 8.6 10*3/uL — ABNORMAL HIGH (ref 1.7–7.7)
Neutrophils Relative %: 81 %
Platelets: 281 10*3/uL (ref 150–400)
RBC: 4.07 MIL/uL (ref 3.87–5.11)
RDW: 15.8 % — ABNORMAL HIGH (ref 11.5–15.5)
WBC: 10.5 10*3/uL (ref 4.0–10.5)
nRBC: 0 % (ref 0.0–0.2)

## 2022-05-26 LAB — URINALYSIS, ROUTINE W REFLEX MICROSCOPIC
Bilirubin Urine: NEGATIVE
Glucose, UA: NEGATIVE mg/dL
Hgb urine dipstick: NEGATIVE
Ketones, ur: NEGATIVE mg/dL
Leukocytes,Ua: NEGATIVE
Nitrite: NEGATIVE
Protein, ur: NEGATIVE mg/dL
Specific Gravity, Urine: 1.01 (ref 1.005–1.030)
pH: 5 (ref 5.0–8.0)

## 2022-05-26 LAB — TROPONIN I (HIGH SENSITIVITY)
Troponin I (High Sensitivity): 2 ng/L (ref <18)
Troponin I (High Sensitivity): 2 ng/L (ref <18)

## 2022-05-26 LAB — GLUCOSE, CAPILLARY: Glucose-Capillary: 212 mg/dL — ABNORMAL HIGH (ref 70–99)

## 2022-05-26 LAB — TSH: TSH: 1.292 u[IU]/mL (ref 0.350–4.500)

## 2022-05-26 NOTE — ED Provider Notes (Signed)
[facility] EMERGENCY DEPARTMENT Provider Note   CSN: [id number] Arrival date & time: 05/26/22  1135     History  Chief Complaint  Patient presents with   Bradycardia    Meagan Allen is a 66 y.o. female.  past medical history significant for nonischemic cardiomyopathy initial ejection fraction 30 to 35%, hypertension, IBS, hyperlipidemia, hypothyroidism, diabetes currently undergoing treatment for possible giant cell arteritis on high-dose prednisone since October 28 presenting with episode of bradycardia, near syncope, lightheadedness and dizziness.  States she underwent a temporal artery biopsy for giant cell arteritis at [facility] yesterday.  This was done under local anesthesia.  During the procedure she was told that her heart rate was dropping to the 40s and 50s and she should follow-up with her cardiologist.  She states were many alarms during the procedure and she felt dizzy, lightheaded and clammy and like she was going to pass out.  Did not actually lose consciousness.  This morning her heart rate was again in the 40s and 50s and she called her cardiologist who was not in and was referred to the ED.  Reports her dizziness and lightheadedness has improved.  She has no chest pain or shortness of breath.  Nausea but no vomiting.  No change in her ongoing headache since her temporal artery biopsy.  No recent changes to her medications including her beta-blocker.  Still feels somewhat lightheaded like she is going to pass out.  Heart rate is 60s and 70s on arrival.  Denies any chest pain.  Denies any shortness of breath  The history is provided by the patient.       Home Medications Prior to Admission medications   Medication Sig Start Date End Date Taking? Authorizing Provider  Allopurinol 200 MG TABS Take 200 mg by mouth daily. 07/14/21   [provider]  aspirin EC 81 MG tablet Take 81 mg by mouth daily.    [provider]   aspirin-acetaminophen-caffeine (EXCEDRIN MIGRAINE) [phone] MG tablet Take by mouth every 6 (six) hours as needed for headache.    [provider]  buPROPion (WELLBUTRIN XL) 150 MG 24 hr tablet Take 450 mg by mouth every morning.  01/21/13   [provider]  Calcium-Magnesium-Vitamin D (CALCIUM 1200+D3 PO) Take 1 tablet by mouth 2 (two) times daily. 05/11/19   [provider]  cetirizine (ZYRTEC) 10 MG tablet Take 10 mg by mouth daily.    [provider]  Cholecalciferol (VITAMIN D) 50 MCG (2000 UT) CAPS Take 1 capsule by mouth daily.    [provider]  cinacalcet (SENSIPAR) 30 MG tablet Take 30 mg by mouth daily. 08/26/19   [provider]  clindamycin (CLEOCIN T) 1 % external solution Apply 1 application topically 2 (two) times daily. 07/14/19   [provider]  clonazePAM (KLONOPIN) 0.5 MG tablet Take 0.5 mg by mouth at bedtime.    [provider]  CONTOUR NEXT TEST test strip 1 each by Other route as needed for other (Glucose test). 01/07/19   [provider]  doxycycline (VIBRAMYCIN) 100 MG capsule Take 100 mg by mouth as directed. 09/11/19   [provider]  furosemide (LASIX) 20 MG tablet Take 1 tablet (20 mg total) by mouth daily. 08/17/21   Park Liter, MD  HUMIRA PEN 40 MG/0.4ML PNKT Inject 40 mg into the skin every 14 (fourteen) days. 02/22/21   [provider]  mesalamine (LIALDA) 1.2 g EC tablet Take 1.2 g by  mouth daily with breakfast.    [provider]  metFORMIN (GLUCOPHAGE-XR) 500 MG 24 hr tablet Take 1,000 mg by mouth every evening.    [provider]  metoprolol succinate (TOPROL-XL) 100 MG 24 hr tablet TAKE 1 TABLET BY MOUTH DAILY WITH OR IMMEDIATELY FOLLOWING A MEAL. Patient taking differently: Take 100 mg by mouth daily. 08/16/18   Park Liter, MD  Misc Natural Products (COSAMIN ASU ADVANCED FORMULA) CAPS Take 2 tablets by mouth daily. Unknown strength     [provider]  nystatin (MYCOSTATIN/NYSTOP) powder Apply 1 application topically as needed (yeast).    [provider]  pantoprazole (PROTONIX) 40 MG tablet Take 40 mg by mouth daily.    [provider]  polyethylene glycol powder (GLYCOLAX/MIRALAX) powder Take 17 g by mouth daily as needed for mild constipation or moderate constipation (for constipation.). 02/24/13   [provider]  rosuvastatin (CRESTOR) 5 MG tablet Take 5 mg by mouth 3 (three) times a week. 06/23/19   [provider]  sacubitril-valsartan (ENTRESTO) 49-51 MG Take 1 tablet by mouth 2 (two) times daily. 06/06/21   Park Liter, MD  SYNTHROID 112 MCG tablet Take 112 mcg by mouth daily. 08/15/21   [provider]  TRULANCE 3 MG TABS Take 1 tablet by mouth daily. 03/14/21   [provider]  TRULICITY 1.5 PN/3.6RW SOPN Inject 0.5 mLs as directed once a week. 09/19/21   [provider]      Allergies    Hydroxychloroquine, Sulfa antibiotics, Ace inhibitors, Canagliflozin, Empagliflozin, Exenatide, Gabapentin, Lisinopril, Methotrexate, Other, Pravastatin, and Propofol    Review of Systems   Review of Systems  Constitutional:  Negative for activity change, appetite change and fever.  HENT:  Negative for congestion and rhinorrhea.   Respiratory:  Negative for cough, chest tightness and shortness of breath.   Cardiovascular:  Negative for chest pain.  Gastrointestinal:  Positive for nausea. Negative for vomiting.  Genitourinary:  Negative for dysuria and hematuria.  Musculoskeletal:  Negative for arthralgias and myalgias.  Neurological:  Positive for dizziness and light-headedness.   all other systems are negative except as noted in the HPI and PMH.    Physical Exam Updated Vital Signs BP 127/79   Pulse 65   Temp 98.1 F (36.7 C) (Oral)   Resp 17   Ht 5' 7"  (1.702 m)   Wt 83.9 kg   SpO2 98%   BMI 28.98 kg/m  Physical Exam Vitals and nursing  note reviewed.  Constitutional:      General: She is not in acute distress.    Appearance: She is well-developed.  HENT:     Head: Normocephalic.     Comments: Surgical dressing in place left temple    Mouth/Throat:     Pharynx: No oropharyngeal exudate.  Eyes:     Conjunctiva/sclera: Conjunctivae normal.     Pupils: Pupils are equal, round, and reactive to light.  Neck:     Comments: No meningismus. Cardiovascular:     Rate and Rhythm: Normal rate. Rhythm irregular.     Heart sounds: Normal heart sounds. No murmur heard.    Comments: Frequent PVCs, 60s and 70s Pulmonary:     Effort: Pulmonary effort is normal. No respiratory distress.     Breath sounds: Normal breath sounds.  Abdominal:     Palpations: Abdomen is soft.     Tenderness: There is no abdominal tenderness. There is no guarding or rebound.  Musculoskeletal:  General: No tenderness. Normal range of motion.     Cervical back: Normal range of motion and neck supple.  Skin:    General: Skin is warm.  Neurological:     Mental Status: She is alert and oriented to person, place, and time.     Cranial Nerves: No cranial nerve deficit.     Motor: No abnormal muscle tone.     Coordination: Coordination normal.     Comments:  5/5 strength throughout. CN 2-12 intact.Equal grip strength.   Psychiatric:        Behavior: Behavior normal.     ED Results / Procedures / Treatments   Labs (all labs ordered are listed, but only abnormal results are displayed) Labs Reviewed  CBC WITH DIFFERENTIAL/PLATELET - Abnormal; Notable for the following components:      Result Value   RDW 15.8 (*)    Neutro Abs 8.6 (*)    All other components within normal limits  BASIC METABOLIC PANEL - Abnormal; Notable for the following components:   Glucose, Bld 241 (*)    BUN 37 (*)    Creatinine, Ser 1.58 (*)    GFR, Estimated 36 (*)    All other components within normal limits  URINALYSIS, ROUTINE W REFLEX MICROSCOPIC  TSH  TROPONIN  I (HIGH SENSITIVITY)  TROPONIN I (HIGH SENSITIVITY)    EKG EKG Interpretation  Date/Time:  Friday May 26 2022 11:54:44 EST Ventricular Rate:  67 PR Interval:  140 QRS Duration: 135 QT Interval:  391 QTC Calculation: 413 R Axis:   -53 Text Interpretation: Sinus rhythm Ventricular premature complex Nonspecific IVCD with LAD Left ventricular hypertrophy Anterior Q waves, possibly due to LVH No significant change was found Confirmed by Ezequiel Essex [phone]) on 05/26/2022 2:54:31 PM  Radiology DG Chest Portable 1 View  Result Date: 05/26/2022 CLINICAL DATA:  Weakness. Bradycardia. Status post biopsy yesterday. EXAM: PORTABLE CHEST 1 VIEW COMPARISON:  06/24/2020 FINDINGS: Heart size and mediastinal contours are unremarkable. No pleural effusion or edema. No airspace opacity. Visualized osseous structures appear intact. IMPRESSION: No active disease. Electronically Signed   By: Kerby Moors M.D.   On: 05/26/2022 12:36    Procedures Procedures    Medications Ordered in ED Medications - No data to display  ED Course/ Medical Decision Making/ A&P                           Medical Decision Making Amount and/or Complexity of Data Reviewed Labs: ordered. Decision-making details documented in ED Course. Radiology: ordered and independent interpretation performed. Decision-making details documented in ED Course. ECG/medicine tests: ordered and independent interpretation performed. Decision-making details documented in ED Course.  Risk Decision regarding hospitalization.   Near syncope with bradycardia, lightheadedness, dizziness and nausea.  Symptoms now improved.  Heart rate normal on arrival.  EKG without evidence of high degree AV block.  Blood pressure and mental status are stable.  Labs are reassuring.  Patient monitored in the ED has had no episodes of bradycardia.  Heart rate has been in the 60s and 70s.  Orthostatics are negative.  Discussed with Dr. Alveda Reasons  cardiology.  She agrees unclear etiology of patient's near syncope and bradycardia.  Recommends holding her beta-blocker and monitoring on the hospitalist service overnight.  They will consult in the morning as necessary.  Patient still feeling dizzy with getting up but not orthostatic.  Denies chest pain or shortness of breath.  Heart rate remains in the 60s  and 70s.  With PVCs.  Observation admission per cardiology recommendations discussed with patient.  She is agreeable.  Hold beta-blocker.  Discussed with Dr. Sloan Leiter.        Final Clinical Impression(s) / ED Diagnoses Final diagnoses:  Near syncope  Bradycardia    Rx / DC Orders ED Discharge Orders     None         Ezariah Nace, Annie Main, MD 05/26/22 1527

## 2022-05-26 NOTE — ED Notes (Signed)
Pt amb to [facility]

## 2022-05-26 NOTE — Progress Notes (Signed)
Pt admitted from [place] assisted by the EMS team per stretcher. Pt is alert and oriented x 4. Pt denies chest pain, denies nausea and vomiting, not in respiratory distress. Admitting MD on call, Dr. Marlowe Sax was notified .    05/26/22 2241  Vitals  Temp 98.7 F (37.1 C)  Temp Source Oral  BP 120/62  MAP (mmHg) 78  BP Location Left Arm  BP Method Automatic  Patient Position (if appropriate) Lying  ECG Heart Rate (!) 55  Resp 14  MEWS COLOR  MEWS Score Color Green  Oxygen Therapy  SpO2 97 %  O2 Device Room Air  Pain Assessment  Pain Scale 0-10  Pain Score 0  Height and Weight  Height 5' 7"  (1.702 m)  Weight 84.3 kg  Type of Scale Used Standing  Type of Weight Actual  BSA (Calculated - sq m) 2 sq meters  BMI (Calculated) 29.11  Weight in (lb) to have BMI = 25 159.3  MEWS Score  MEWS Temp 0  MEWS Systolic 0  MEWS Pulse 0  MEWS RR 0  MEWS LOC 0  MEWS Score 0

## 2022-05-26 NOTE — Telephone Encounter (Signed)
Patient is calling to advise the office she is in the ED due to just getting a mychart message. Please advise.

## 2022-05-26 NOTE — ED Notes (Signed)
Pt tolerated po fluid and ambulated w/o difficulty

## 2022-05-26 NOTE — ED Triage Notes (Signed)
Pt arrives pov, steady gait, reports had biopsy yesterday and was having bradycardia. Was instructed to see Cards, but unable to get in yesterday. Endorses pulse rate at home upper 40s to mid 50s. Endorses nausea and "feeling clammy". Also endorses hypotension. Also reports intermittent CP for a few weeks and left posterior shoulder pain. Took tylenol pta

## 2022-05-27 ENCOUNTER — Telehealth: Payer: Self-pay | Admitting: Physician Assistant

## 2022-05-27 DIAGNOSIS — K219 Gastro-esophageal reflux disease without esophagitis: Secondary | ICD-10-CM

## 2022-05-27 DIAGNOSIS — R001 Bradycardia, unspecified: Secondary | ICD-10-CM

## 2022-05-27 DIAGNOSIS — E1122 Type 2 diabetes mellitus with diabetic chronic kidney disease: Secondary | ICD-10-CM

## 2022-05-27 DIAGNOSIS — I493 Ventricular premature depolarization: Secondary | ICD-10-CM | POA: Diagnosis not present

## 2022-05-27 DIAGNOSIS — F32A Depression, unspecified: Secondary | ICD-10-CM

## 2022-05-27 DIAGNOSIS — F419 Anxiety disorder, unspecified: Secondary | ICD-10-CM

## 2022-05-27 DIAGNOSIS — R079 Chest pain, unspecified: Secondary | ICD-10-CM | POA: Diagnosis not present

## 2022-05-27 DIAGNOSIS — N179 Acute kidney failure, unspecified: Secondary | ICD-10-CM | POA: Insufficient documentation

## 2022-05-27 DIAGNOSIS — N1832 Chronic kidney disease, stage 3b: Secondary | ICD-10-CM

## 2022-05-27 LAB — BASIC METABOLIC PANEL
Anion gap: 6 (ref 5–15)
BUN: 29 mg/dL — ABNORMAL HIGH (ref 8–23)
CO2: 25 mmol/L (ref 22–32)
Calcium: 9.3 mg/dL (ref 8.9–10.3)
Chloride: 109 mmol/L (ref 98–111)
Creatinine, Ser: 1.49 mg/dL — ABNORMAL HIGH (ref 0.44–1.00)
GFR, Estimated: 38 mL/min — ABNORMAL LOW (ref >60)
Glucose, Bld: 136 mg/dL — ABNORMAL HIGH (ref 70–99)
Potassium: 4 mmol/L (ref 3.5–5.1)
Sodium: 140 mmol/L (ref 135–145)

## 2022-05-27 LAB — MAGNESIUM: Magnesium: 1.6 mg/dL — ABNORMAL LOW (ref 1.7–2.4)

## 2022-05-27 LAB — HIV ANTIBODY (ROUTINE TESTING W REFLEX): HIV Screen 4th Generation wRfx: NONREACTIVE

## 2022-05-27 LAB — HEMOGLOBIN A1C
Hgb A1c MFr Bld: 6.7 % — ABNORMAL HIGH (ref 4.8–5.6)
Mean Plasma Glucose: 145.59 mg/dL

## 2022-05-27 LAB — BRAIN NATRIURETIC PEPTIDE: B Natriuretic Peptide: 142.3 pg/mL — ABNORMAL HIGH (ref 0.0–100.0)

## 2022-05-27 LAB — GLUCOSE, CAPILLARY
Glucose-Capillary: 131 mg/dL — ABNORMAL HIGH (ref 70–99)
Glucose-Capillary: 234 mg/dL — ABNORMAL HIGH (ref 70–99)

## 2022-05-27 MED ORDER — ACETAMINOPHEN 650 MG RE SUPP: 650.0000 mg | Freq: Four times a day (QID) | RECTAL | Status: DC | PRN

## 2022-05-27 MED ORDER — INSULIN ASPART 100 UNIT/ML IJ SOLN: 0.0000 [IU] | Freq: Every day | INTRAMUSCULAR | Status: DC

## 2022-05-27 MED ORDER — PREDNISONE 20 MG PO TABS
60.0000 mg | ORAL_TABLET | Freq: Every day | ORAL | Status: DC
  Administered 2022-05-27: 60 mg via ORAL
  Filled 2022-05-27: qty 3

## 2022-05-27 MED ORDER — INSULIN ASPART 100 UNIT/ML IJ SOLN: 0.0000 [IU] | Freq: Three times a day (TID) | INTRAMUSCULAR | Status: DC

## 2022-05-27 MED ORDER — ENOXAPARIN SODIUM 40 MG/0.4ML IJ SOSY
40.0000 mg | PREFILLED_SYRINGE | INTRAMUSCULAR | Status: DC
  Administered 2022-05-27: 40 mg via SUBCUTANEOUS
  Filled 2022-05-27: qty 0.4

## 2022-05-27 MED ORDER — ACETAMINOPHEN 325 MG PO TABS
650.0000 mg | ORAL_TABLET | Freq: Four times a day (QID) | ORAL | Status: DC | PRN
  Administered 2022-05-27: 650 mg via ORAL
  Filled 2022-05-27: qty 2

## 2022-05-27 MED ORDER — MAGNESIUM OXIDE 400 MG PO TABS: 400.0000 mg | ORAL_TABLET | Freq: Every day | ORAL | 0 refills | Status: DC

## 2022-05-27 NOTE — Progress Notes (Signed)
Mobility Specialist - Progress Note   05/27/22 1438  Mobility  Activity Ambulated with assistance in hallway  Level of Assistance Standby assist, set-up cues, supervision of patient - no hands on  Assistive Device None  Distance Ambulated (ft) 500 ft  Activity Response Tolerated well  Mobility Referral Yes  $Mobility charge 1 Mobility    During mobility: 103 HR Post-mobility:72 HR  Pt received in bed and agreeable to mobility. Pt c/o slight wooziness and pain at incision sight.Pt was returned to bed with all needs met.   Franki Monte  Mobility Specialist Please contact via Solicitor or Rehab office at [phone]

## 2022-05-27 NOTE — H&P (Signed)
History and Physical    Meagan Allen PFY:[id number] DOB: 11-Sep-1955 DOA: 05/26/2022  PCP: Leonel Ramsay, MD  Patient coming from: Home  Chief Complaint: Low heart rate  HPI: Meagan Allen is a 66 y.o. female with medical history significant of nonischemic cardiomyopathy, hypertension, hyperlipidemia, anxiety/depression, hypothyroidism, GERD, meningioma, type 2 diabetes, gout, currently undergoing treatment for possible giant cell arteritis on high-dose prednisone presented to the ED with complaint of bradycardia, near syncope, lightheadedness/dizziness, and chest pain.  She underwent temporal artery biopsy for giant cell arteritis at [facility] on 11/9.  This was done under local anesthesia.  During the procedure she was told that her heart rates were dropping to the 40s to 50s and was advised to follow-up with her cardiologist.  In the ED, patient noted to be bradycardic with heart rate as low as 50s.  Blood pressure stable.  Labs showing glucose 241 with normal bicarb, BUN 37, creatinine 1.5, troponin negative x2, TSH normal.  Chest x-ray showing no active disease.  Patient states she is currently being followed by rheumatology at [facility] and undergoing work-up for giant cell arteritis.  She has been on high-dose prednisone since 10/28 and had temporal artery biopsy done 2 days ago.  During the biopsy procedure, she felt nauseous, clammy, and felt like she was going to pass out.  She was told that her heart rate was dropping to the 30s to 40s.  She was advised to follow-up with her cardiologist.  She denies loss of consciousness.  Since after the procedure, she has continued to experience symptoms at home which prompted her to seek medical attention.  She takes metoprolol 100 mg twice daily.  Patient also reports decline in her kidney function for the past 6 months for which she had a renal ultrasound done yesterday.  States she was told that the ultrasound was showing a  small nonobstructing kidney stone and no other acute abnormality.  She reports adequate oral intake.  Reports having diarrhea earlier this month but it has resolved.  Denies any episodes of vomiting.  She was previously taking Lasix 4 times a week but the dose was reduced to 3 times a week by her physician due to concern for worsening kidney function.  Patient also reports nonexertional chest pain for the past 1 month which she describes as pain that starts in her right arm and then radiates across her entire chest to her left arm.  Denies any chest pain at present.  Review of Systems:  Review of Systems  All other systems reviewed and are negative.   Past Medical History:  Diagnosis Date   Anemia    no recent iron transfusion per pt on 05-16-2021   Anxiety    Arthritis    osteo, inflammatory arthritis and rheumatoid arthritis   Cancer (HCC)    thyroid cancer   CHF (congestive heart failure) ([place])    diagnosed feb 2019   Deafness in right ear    Depression    Dysphagia 07/19/2020   occ   Dyspnea    on exertion on rare occasions   Enlarged liver    Expressive aphasia 03/15/2020   resolved per pt on 05-16-2021   Fibromyalgia    Gastroesophageal reflux disease with esophagitis without hemorrhage 07/19/2020   GERD (gastroesophageal reflux disease)    Headache disorder 03/15/2020   Heart murmur    High uric acid in 24 hour urine specimen 09/24/2019   History of colon polyps  History of corticosteroid therapy 07/13/2020   History of COVID-19 03/2021   mild symptoms x 5 days all symptoms resolved   History of thyroid cancer 2007;  2009   dx papillary thyroid cancer w/ mets to cervical lymph nodes   HOH (hard of hearing)    left ear   HTN (hypertension) 03/25/2012   Hypercalcemia 07/13/2020   Hyperlipidemia    Hypertension    Hypothyroidism 07/13/2020   IBS (irritable bowel syndrome)    constipation issues   Iron deficiency anemia 07/13/2020   Lesion of liver 07/19/2020    Meningioma ([place]) 03/29/2021   8 mm left frontal lobe unchanged suspected 3 mm left ant temporal lobe memingioma unchanged in size per 03-29-2021 brain mri   Migraine    Mood changes    Morbid obesity ([place]) 07/13/2020   Nonischemic cardiomyopathy [facility]) cardiologist-  dr Edyth Gunnels   per cardiac cath 08-28-2017  moderate LV dysfunction w/ diffuse hypocontractility ,  ef 35-40%   Optic nerve and pathway injury, left, initial encounter    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, per opthamologist black spot middle of left eye   PONV (postoperative nausea and vomiting)    last colonoscopy propfol bp dropped and vomiting   Post-surgical hypothyroidism    Primary localized osteoarthrosis of ankle and foot 02/13/2020   and hands   Rectal bleeding 05/02/2021   Rosacea    Thyroid cancer ([place]) 06/25/2006   surgery and radioactive iodine done   Type 2 diabetes mellitus ([place])    followed by pcp   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[place])    Wears glasses    Wears hearing aid in both ears    left ear hearing aid, microphone in right ear    Past Surgical History:  Procedure Laterality Date   CARDIOVASCULAR STRESS TEST  08-20-2017   dr Agustin Cree   High risk nuclear study w/ large irreverisible defect in the basal and mid inferoseptal, inferior, inferolateral walls and apical septal, inferior walls with small peri infarct ischemia in the apical anterior & lateral walls (consistant w/ prior MI )/  no ST segment deviation noted /  nuclear stress ef 36%/  recommended cardiac cath   CARPOMETACARPAL [facility]) FUSION OF THUMB Bilateral 2003   COLONOSCOPY WITH PROPOFOL N/A 05/06/2013   Procedure: COLONOSCOPY WITH PROPOFOL;  Surgeon: Garlan Fair, MD;  Location: [facility];  Service: Endoscopy;  Laterality: N/A;   D & C HYSTEROSCOPY /  RESECTION POLYP/ ROLLER BALL ABLATION  02-26-2004   dr Kathyrn Drown  [facility]   DILATION AND CURETTAGE OF UTERUS  1984   EXCISION MORTON'S NEUROMA Left 1996   giant cell tumor  2011   Left  ankle 2011   JOINT REPLACEMENT Left 05/2020   2 [facility] [place]   KNEE ARTHROSCOPY  08-16-2010  dr Beola Cord  [facility];  05/ 2012   right x2 left x1   LEFT HEART CATH AND CORONARY ANGIOGRAPHY N/A 08/28/2017   Procedure: LEFT HEART CATH AND CORONARY ANGIOGRAPHY;  Surgeon: Troy Sine, MD;  Location: [place] CV LAB;  Service: Cardiovascular;  Laterality: N/A;   large normal coronary arteries in a dominant RCA system;  moderate LV systoic dysfunction w/ diffuse hypocontractility (compatible w/ nonischemic cardiomyopathy), LV end diastoilc pressure normal, LVEF 35-45% by visual estimate   Left knee replaced   05/2010   NASAL LACRIMAL DUCT SURGERY  06/14/2009   REDO RIGHT MODIFIED RADICAL NECK DISSECTION  08-29-2007    [facility]   SHOULDER ARTHROSCOPY WITH  ROTATOR CUFF REPAIR AND SUBACROMIAL DECOMPRESSION Left 10/17/2017   Procedure: Left shoulder mini open rotator cuff repair, subacromial decompression;  Surgeon: [name], MD;  Location: [facility];  Service: Orthopedics;  Laterality: Left;  Interscalene Block   TOTAL KNEE ARTHROPLASTY Right 06/03/2018   Procedure: RIGHT TOTAL KNEE ARTHROPLASTY;  Surgeon: Vickey Huger, MD;  Location: [facility];  Service: Orthopedics;  Laterality: Right;   TOTAL THYROIDECTOMY  2007  -- [facility]   w/ dissection lymph nodes   TRANSANAL HEMORRHOIDAL DEARTERIALIZATION N/A 05/20/2021   Procedure: TRANSANAL HEMORRHOIDAL DEARTERIALIZATION;  Surgeon: Leighton Ruff, MD;  Location: [facility];  Service: General;  Laterality: N/A;   TRANSTHORACIC ECHOCARDIOGRAM  08-20-2017  dr Agustin Cree   ef 25-30%, severe diffuse hypokinesis with no identifiable regional variations, but with profound dyssynchrony, due to arrhythmia insuffient to evaluate LV diastolic dysfunction/  trivial MR/  mild LAE/ Ventricular septum motion abnormal funtion,dyssynergy, & paradox   WRIST SURGERY Right 2001     reports that she quit smoking about 41 years ago. Her smoking use  included cigarettes. She has a 30.00 pack-year smoking history. She has been exposed to tobacco smoke. She has never used smokeless tobacco. She reports current alcohol use. She reports that she does not use drugs.  Allergies  Allergen Reactions   Hydroxychloroquine Other (See Comments)    Caused blisters in mouth   Sulfa Antibiotics Hives, Nausea Only and Other (See Comments)    Other reaction(s): hives Other reaction(s): Other (See Comments) Other reaction(s): hives    Ace Inhibitors     Other reaction(s): fell out   Canagliflozin Other (See Comments)    Yeast issues.  Other reaction(s): yeast infections   Empagliflozin Other (See Comments)    Yeast issues.  Other reaction(s): yeast infections   Exenatide Other (See Comments)    Yeast issues. Other reaction(s): yeast infection   Gabapentin Other (See Comments)    Not sure Other reaction(s): GI sx   Lisinopril Other (See Comments)    Pt is unsure of exact reaction   Methotrexate Other (See Comments)    Elevated LFT    Other Other (See Comments)   Pravastatin Other (See Comments)    Body pain.   Propofol Nausea And Vomiting and Other (See Comments)    Blood pressure bottomed out/syncope    Family History  Problem Relation Age of Onset   Heart disease Father        MI died age 24   Hypertension Father    Diabetes Father    Arthritis Mother        rheumatoid athritis   Stroke Mother    Depression Mother    Gout Brother    Cancer Brother        thyroid CA   Gout Brother    Gout Brother    Heart disease Sister        23 months old   Asthma Son    Breast cancer Neg Hx     Prior to Admission medications   Medication Sig Start Date End Date Taking? Authorizing Provider  Allopurinol 200 MG TABS Take 200 mg by mouth daily. 07/14/21   [provider]  aspirin EC 81 MG tablet Take 81 mg by mouth daily.    [provider]  aspirin-acetaminophen-caffeine (EXCEDRIN MIGRAINE) [phone] MG tablet Take  by mouth every 6 (six) hours as needed for headache.    [provider]  buPROPion (WELLBUTRIN XL) 150 MG 24 hr tablet Take 450  mg by mouth every morning.  01/21/13   [provider]  Calcium-Magnesium-Vitamin D (CALCIUM 1200+D3 PO) Take 1 tablet by mouth 2 (two) times daily. 05/11/19   [provider]  cetirizine (ZYRTEC) 10 MG tablet Take 10 mg by mouth daily.    [provider]  Cholecalciferol (VITAMIN D) 50 MCG (2000 UT) CAPS Take 1 capsule by mouth daily.    [provider]  cinacalcet (SENSIPAR) 30 MG tablet Take 30 mg by mouth daily. 08/26/19   [provider]  clindamycin (CLEOCIN T) 1 % external solution Apply 1 application topically 2 (two) times daily. 07/14/19   [provider]  clonazePAM (KLONOPIN) 0.5 MG tablet Take 0.5 mg by mouth at bedtime.    [provider]  CONTOUR NEXT TEST test strip 1 each by Other route as needed for other (Glucose test). 01/07/19   [provider]  doxycycline (VIBRAMYCIN) 100 MG capsule Take 100 mg by mouth as directed. 09/11/19   [provider]  furosemide (LASIX) 20 MG tablet Take 1 tablet (20 mg total) by mouth daily. 08/17/21   Park Liter, MD  HUMIRA PEN 40 MG/0.4ML PNKT Inject 40 mg into the skin every 14 (fourteen) days. 02/22/21   [provider]  mesalamine (LIALDA) 1.2 g EC tablet Take 1.2 g by mouth daily with breakfast.    [provider]  metFORMIN (GLUCOPHAGE-XR) 500 MG 24 hr tablet Take 1,000 mg by mouth every evening.    [provider]  metoprolol succinate (TOPROL-XL) 100 MG 24 hr tablet TAKE 1 TABLET BY MOUTH DAILY WITH OR IMMEDIATELY FOLLOWING A MEAL. Patient taking differently: Take 100 mg by mouth daily. 08/16/18   Park Liter, MD  Misc Natural Products (COSAMIN ASU ADVANCED FORMULA) CAPS Take 2 tablets by mouth daily. Unknown strength    [provider]  nystatin (MYCOSTATIN/NYSTOP) powder Apply 1  application topically as needed (yeast).    [provider]  pantoprazole (PROTONIX) 40 MG tablet Take 40 mg by mouth daily.    [provider]  polyethylene glycol powder (GLYCOLAX/MIRALAX) powder Take 17 g by mouth daily as needed for mild constipation or moderate constipation (for constipation.). 02/24/13   [provider]  rosuvastatin (CRESTOR) 5 MG tablet Take 5 mg by mouth 3 (three) times a week. 06/23/19   [provider]  sacubitril-valsartan (ENTRESTO) 49-51 MG Take 1 tablet by mouth 2 (two) times daily. 06/06/21   Park Liter, MD  SYNTHROID 112 MCG tablet Take 112 mcg by mouth daily. 08/15/21   [provider]  TRULANCE 3 MG TABS Take 1 tablet by mouth daily. 03/14/21   [provider]  TRULICITY 1.5 GY/6.9SW SOPN Inject 0.5 mLs as directed once a week. 09/19/21   [provider]    Physical Exam: Vitals:   05/26/22 1920 05/26/22 2130 05/26/22 2241 05/27/22 0052  BP:  107/64 120/62 104/69  Pulse:  (!) 51  (!) 45  Resp:  14 14 17   Temp: 97.7 F (36.5 C)  98.7 F (37.1 C) 97.7 F (36.5 C)  TempSrc: Oral  Oral Oral  SpO2:  97% 97% 98%  Weight:   84.3 kg   Height:   5' 7"  (1.702 m)     Physical Exam Vitals reviewed.  Constitutional:      General: She is not in acute distress. HENT:     Head: Normocephalic and atraumatic.     Mouth/Throat:     Mouth: Mucous membranes  are dry.  Eyes:     Extraocular Movements: Extraocular movements intact.  Cardiovascular:     Rate and Rhythm: Normal rate and regular rhythm.     Pulses: Normal pulses.  Pulmonary:     Effort: Pulmonary effort is normal. No respiratory distress.     Breath sounds: Normal breath sounds. No wheezing or rales.  Abdominal:     General: Bowel sounds are normal. There is no distension.     Palpations: Abdomen is soft.     Tenderness: There is no abdominal tenderness.  Musculoskeletal:     Cervical back: Normal range of motion.     Right  lower leg: No edema.     Left lower leg: No edema.  Skin:    General: Skin is warm and dry.  Neurological:     General: No focal deficit present.     Mental Status: She is alert and oriented to person, place, and time.     Labs on Admission: I have personally reviewed following labs and imaging studies  CBC: Recent Labs  Lab 05/26/22 1215  WBC 10.5  NEUTROABS 8.6*  HGB 13.0  HCT 37.9  MCV 93.1  PLT 811   Basic Metabolic Panel: Recent Labs  Lab 05/26/22 1215  NA 136  K 4.2  CL 104  CO2 22  GLUCOSE 241*  BUN 37*  CREATININE 1.58*  CALCIUM 9.2   GFR: Estimated Creatinine Clearance: 38.6 mL/min (A) (by C-G formula based on SCr of 1.58 mg/dL (H)). Liver Function Tests: No results for input(s): "AST", "ALT", "ALKPHOS", "BILITOT", "PROT", "ALBUMIN" in the last 168 hours. No results for input(s): "LIPASE", "AMYLASE" in the last 168 hours. No results for input(s): "AMMONIA" in the last 168 hours. Coagulation Profile: No results for input(s): "INR", "PROTIME" in the last 168 hours. Cardiac Enzymes: No results for input(s): "CKTOTAL", "CKMB", "CKMBINDEX", "TROPONINI" in the last 168 hours. BNP (last 3 results) No results for input(s): "PROBNP" in the last 8760 hours. HbA1C: No results for input(s): "HGBA1C" in the last 72 hours. CBG: Recent Labs  Lab 05/26/22 2248  GLUCAP 212*   Lipid Profile: No results for input(s): "CHOL", "HDL", "LDLCALC", "TRIG", "CHOLHDL", "LDLDIRECT" in the last 72 hours. Thyroid Function Tests: Recent Labs    05/26/22 1215  TSH 1.292   Anemia Panel: No results for input(s): "VITAMINB12", "FOLATE", "FERRITIN", "TIBC", "IRON", "RETICCTPCT" in the last 72 hours. Urine analysis:    Component Value Date/Time   COLORURINE YELLOW 05/26/2022 [place] 05/26/2022 1353   LABSPEC 1.010 05/26/2022 1353   PHURINE 5.0 05/26/2022 1353   GLUCOSEU NEGATIVE 05/26/2022 1353   HGBUR NEGATIVE 05/26/2022 1353   BILIRUBINUR NEGATIVE  05/26/2022 1353   KETONESUR NEGATIVE 05/26/2022 1353   PROTEINUR NEGATIVE 05/26/2022 1353   NITRITE NEGATIVE 05/26/2022 1353   LEUKOCYTESUR NEGATIVE 05/26/2022 1353    Radiological Exams on Admission: DG Chest Portable 1 View  Result Date: 05/26/2022 CLINICAL DATA:  Weakness. Bradycardia. Status post biopsy yesterday. EXAM: PORTABLE CHEST 1 VIEW COMPARISON:  06/24/2020 FINDINGS: Heart size and mediastinal contours are unremarkable. No pleural effusion or edema. No airspace opacity. Visualized osseous structures appear intact. IMPRESSION: No active disease. Electronically Signed   By: Kerby Moors M.D.   On: 05/26/2022 12:36    EKG: Independently reviewed.  Sinus bradycardia.  LBBB is not new.  Assessment and Plan  Symptomatic sinus bradycardia She is on metoprolol succinate 100 mg daily.  Heart rate noted to be as low as 30s during  her recent procedure 2 days ago, currently in the 50s to 29s and patient is asymptomatic.  Blood pressure currently stable.  TSH normal. -Cardiac monitoring -Hold beta-blocker and avoid any other AV nodal blocking agents -Consult cardiology in a.m.  Chest pain Likely related to bradycardia.  Troponin x2 negative and not consistent with ACS.  PE less likely given no tachycardia or hypoxia.  Not having active chest pain at this time. -Cardiac monitoring  CKD stage IIIb Creatinine 1.5 and GFR 36.  Per review of care everywhere, creatinine ranging between 1.3-1.5 on labs done since September 2023 and GFR 38-45.  Patient reports history of renal ultrasound done just yesterday and being told that it was showing a small nonobstructing kidney stone but no other acute abnormality.  I am not able to find ultrasound results in her chart or Care Everywhere. -Hold home Entresto and Lasix at this time and repeat BMP in a.m.  Nonischemic cardiomyopathy EF was as low as 25 to 30% in 2019, improved to 50-55% on echo done 10/2021.  LHC done in 08/2017 showing normal coronary  arteries.  No signs of volume overload on exam. -Check BNP -Hold Entresto and Lasix at this time and repeat BMP in a.m.  Hypertension Stable. -Holding antihypertensives at this time to avoid hypotension in the setting of bradycardia  Type 2 diabetes A1c 6.0 in November 2019. Glucose in the 200s in the setting of high-dose steroid use. -Repeat A1c -Sliding scale insulin  Possible giant cell arteritis Currently undergoing work-up by rheumatology at [facility] and had temporal artery biopsy done 2 days ago, unable to see results under care everywhere. -Continue high-dose prednisone after pharmacy med rec is done. -Outpatient follow-up  Hyperlipidemia Anxiety/depression Hypothyroidism: TSH normal. GERD Gout -Pharmacy med rec pending.  DVT prophylaxis: Lovenox Code Status: DNR (discussed with the patient) Family Communication: No family available at this time. Level of care: Telemetry bed Admission status: It is my clinical opinion that referral for OBSERVATION is reasonable and necessary in this patient based on the above information provided. The aforementioned taken together are felt to place the patient at high risk for further clinical deterioration. However, it is anticipated that the patient may be medically stable for discharge from the hospital within 24 to 48 hours.   Shela Leff MD [facility]  If 7PM-7AM, please contact night-coverage [email or web address]  05/27/2022, 1:24 AM

## 2022-05-27 NOTE — Discharge Summary (Addendum)
Physician Discharge Summary   Patient: Meagan Allen MRN: [id number] DOB: February 13, 1956  Admit date:     05/26/2022  Discharge date: 05/27/22  Discharge Physician: Patrecia Pour   PCP: Leonel Ramsay, MD   Recommendations at discharge:  Follow up with cardiology, Dr. Agustin Cree 12/6. Zio patch cardiac monitor will be arranged after discharge. Monitor HR and PVC burden, holding metoprolol due to symptomatic bradycardia. Follow up with PCP and other specialist providers for ongoing work up. Consider recheck Mg, BMP. Started daily magnesium supplement at discharge.  Discharge Diagnoses: Principal Problem:   Symptomatic sinus bradycardia Active Problems:   HTN (hypertension)   Nonischemic cardiomyopathy (HCC)   Type 2 diabetes mellitus (HCC)   Hyperlipidemia   GERD (gastroesophageal reflux disease)   Depression   Anxiety   Hypothyroidism   Chest pain   AKI (acute kidney injury) [facility])  Hospital Course: Meagan Allen is a 66 y.o. female with medical history significant of nonischemic cardiomyopathy, hypertension, hyperlipidemia, anxiety/depression, hypothyroidism, GERD, meningioma, type 2 diabetes, gout, currently undergoing treatment for possible giant cell arteritis on high-dose prednisone presented to the ED with complaint of bradycardia, near syncope, lightheadedness/dizziness, and chest pain.  She underwent temporal artery biopsy for giant cell arteritis at [facility] on 11/9.  This was done under local anesthesia.  During the procedure she was told that her heart rates were dropping to the 40s to 50s and was advised to follow-up with her cardiologist.  In the ED, patient noted to be bradycardic with heart rate as low as 50s.  Blood pressure stable.  Labs showing glucose 241 with normal bicarb, BUN 37, creatinine 1.5, troponin negative x2, TSH normal.  Chest x-ray showing no active disease.   Patient states she is currently being followed by rheumatology at [facility] and undergoing work-up for giant cell arteritis.  She has been on high-dose prednisone since 10/28 and had temporal artery biopsy done 2 days ago.  During the biopsy procedure, she felt nauseous, clammy, and felt like she was going to pass out.  She was told that her heart rate was dropping to the 30s to 40s.  She was advised to follow-up with her cardiologist.  She denies loss of consciousness.  Since after the procedure, she has continued to experience symptoms at home which prompted her to seek medical attention.  She takes metoprolol 100 mg twice daily.  Patient also reports decline in her kidney function for the past 6 months for which she had a renal ultrasound done yesterday.  States she was told that the ultrasound was showing a small nonobstructing kidney stone and no other acute abnormality.  She reports adequate oral intake.  Reports having diarrhea earlier this month but it has resolved.  Denies any episodes of vomiting.  She was previously taking Lasix 4 times a week but the dose was reduced to 3 times a week by her physician due to concern for worsening kidney function.  Patient also reports nonexertional chest pain for the past 1 month which she describes as pain that starts in her right arm and then radiates across her entire chest to her left arm.  Denies any chest pain at present.  Assessment and Plan: Symptomatic sinus bradycardia She is on metoprolol succinate 100 mg daily.  Heart rate noted to be as low as 30s during her recent procedure 2 days ago, currently in the 50s to 60s and patient is asymptomatic.  Blood pressure currently stable.  TSH normal. -  Cardiac monitoring to continue as outpatient. Note improved HR and resolution of symptoms with metoprolol being held. Note some PVCs which will be monitored. Depending on burden, may benefit from antiarrhythmic initiation. Has cardiology follow up.   Chest pain: Likely related to bradycardia, resolved.  Troponin x2 negative and not  consistent with ACS.  PE less likely given no tachycardia or hypoxia.  - No further inpatient evaluation needed.    CKD stage IIIb: Creatinine 1.5 and GFR 36.  Per review of care everywhere, creatinine ranging between 1.3-1.5 on labs done since September 2023 and GFR 38-45.  Patient reports history of renal ultrasound done just yesterday and being told that it was showing a small nonobstructing kidney stone but no other acute abnormality.  I am not able to find ultrasound results in her chart or Care Everywhere. - Continue home medications.    Nonischemic cardiomyopathy: EF was as low as 25 to 30% in 2019, improved to 50-55% on echo done 10/2021.  LHC done in 08/2017 showing normal coronary arteries.  No signs of volume overload on exam. - Appreciate cardiology recommendation. Pt remains euvolemic, no need for echo at this time, and will restart home medications save for metoprolol.     Hypertension Stable. -Hold metop, no other changes.    Type 2 diabetes A1c 6.0 in November 2019. Glucose in the 200s in the setting of high-dose steroid use. - No changes to home medications   Possible giant cell arteritis Currently undergoing work-up by rheumatology at [facility] and had temporal artery biopsy done 2 days ago, unable to see results under care everywhere. -Continue prednisone 66m daily and rheumatology follow up   Hyperlipidemia Anxiety/depression Hypothyroidism: TSH normal. GERD Gout - All stable, no changes to home medications  Consultants: Cardiology Procedures performed: None  Disposition: Home Diet recommendation:  Discharge Diet Orders (From admission, onward)     Start     Ordered   05/27/22 0000  Diet - low sodium heart healthy        05/27/22 1438           DISCHARGE MEDICATION: Allergies as of 05/27/2022       Reactions   Hydroxychloroquine Other (See Comments)   blisters in mouth   Sulfa Antibiotics Hives      Ace Inhibitors Other (See Comments)    fell out   Canagliflozin Other (See Comments)   Yeast issues.   Empagliflozin Other (See Comments)   Yeast issues.   Exenatide Other (See Comments)   Yeast issues.   Gabapentin Nausea And Vomiting   Lisinopril Other (See Comments)   Pt is unsure of exact reaction   Methotrexate Other (See Comments)   Elevated LFT   Pravastatin Other (See Comments)   Body pain.   Propofol Nausea And Vomiting, Other (See Comments)   Blood pressure bottomed out/syncope        Medication List     STOP taking these medications    metoprolol succinate 100 MG 24 hr tablet Commonly known as: TOPROL-XL       TAKE these medications    Allopurinol 200 MG Tabs Take 200 mg by mouth daily.   aspirin EC 81 MG tablet Take 81 mg by mouth daily.   aspirin-acetaminophen-caffeine 250-250-65 MG tablet Commonly known as: EXCEDRIN MIGRAINE Take 1 tablet by mouth every 6 (six) hours as needed for headache.   buPROPion 150 MG 24 hr tablet Commonly known as: WELLBUTRIN XL Take 450 mg by mouth every morning.  CALCIUM 1200+D3 PO Take 1 tablet by mouth 2 (two) times daily.   cetirizine 10 MG tablet Commonly known as: ZYRTEC Take 10 mg by mouth daily.   cinacalcet 30 MG tablet Commonly known as: SENSIPAR Take 30 mg by mouth 3 (three) times a week. Mon, Wed, Fri   clindamycin 1 % external solution Commonly known as: CLEOCIN T Apply 1 application topically 2 (two) times daily.   clonazePAM 0.5 MG tablet Commonly known as: KLONOPIN Take 0.5 mg by mouth at bedtime.   Cosamin ASU Advanced Formula Caps Take 2 tablets by mouth daily as needed (gout flare). Unknown strength   doxycycline 100 MG capsule Commonly known as: VIBRAMYCIN Take 100 mg by mouth as directed.   Entresto 49-51 MG Generic drug: sacubitril-valsartan Take 1 tablet by mouth 2 (two) times daily.   furosemide 20 MG tablet Commonly known as: LASIX Take 1 tablet (20 mg total) by mouth daily. What changed:  when to take  this additional instructions   Humira Pen 40 MG/0.4ML Pnkt Generic drug: Adalimumab Inject 40 mg into the skin every 14 (fourteen) days.   magnesium oxide 400 MG tablet Commonly known as: MAG-OX Take 1 tablet (400 mg total) by mouth daily.   Melatonin 10 MG Caps Take 10 mg by mouth at bedtime.   mesalamine 1.2 g EC tablet Commonly known as: LIALDA Take 1.2 g by mouth at bedtime.   metFORMIN 500 MG 24 hr tablet Commonly known as: GLUCOPHAGE-XR Take 500-1,000 mg by mouth See admin instructions. Take 2 tablets (1,000 mg) in the morning and 1 tablet (500 mg) at bedtime   nystatin powder Commonly known as: MYCOSTATIN/NYSTOP Apply 1 application topically as needed (yeast).   pantoprazole 40 MG tablet Commonly known as: PROTONIX Take 40 mg by mouth daily.   polyethylene glycol powder 17 GM/SCOOP powder Commonly known as: GLYCOLAX/MIRALAX Take 17 g by mouth daily as needed for mild constipation or moderate constipation (for constipation.).   predniSONE 20 MG tablet Commonly known as: DELTASONE Take 60 mg by mouth daily with breakfast.   rosuvastatin 5 MG tablet Commonly known as: CRESTOR Take 5 mg by mouth 3 (three) times a week. Mon, Wed, Fri   Synthroid 112 MCG tablet Generic drug: levothyroxine Take 112 mcg by mouth at bedtime.   Trulance 3 MG Tabs Generic drug: Plecanatide Take 1 tablet by mouth daily as needed (IBS).   Vitamin D 50 MCG (2000 UT) Caps Take 1 capsule by mouth daily.        Follow-up Information     Leonel Ramsay, MD Follow up.   Specialty: Infectious Diseases Contact information: [place] [address] [phone]         Park Liter, MD Follow up.   Specialty: Cardiology Why: Dr. Wendy Poet office will be mailing you a 14 day monitor to wear to evaluate your heart rate. Keep follow-up appointment as scheduled in December Contact information: [place]  [id number] [phone]                Discharge Exam: Danley Danker Weights   05/26/22 1154 05/26/22 2241  Weight: 83.9 kg 84.3 kg  No distress, pleasant Regular with rate in 60's, occasional PVC.  Clear, nonlabored Left scalp dressing c/d/i  Condition at discharge: stable  The results of significant diagnostics from this hospitalization (including imaging, microbiology, ancillary and laboratory) are listed below for reference.   Imaging Studies: DG Chest Portable 1 View  Result  Date: 05/26/2022 CLINICAL DATA:  Weakness. Bradycardia. Status post biopsy yesterday. EXAM: PORTABLE CHEST 1 VIEW COMPARISON:  06/24/2020 FINDINGS: Heart size and mediastinal contours are unremarkable. No pleural effusion or edema. No airspace opacity. Visualized osseous structures appear intact. IMPRESSION: No active disease. Electronically Signed   By: Kerby Moors M.D.   On: 05/26/2022 12:36    Microbiology: Results for orders placed or performed during the hospital encounter of 05/27/18  Surgical pcr screen     Status: None   Collection Time: 05/27/18  2:33 PM   Specimen: Nasal Mucosa; Nasal Swab  Result Value Ref Range Status   MRSA, PCR NEGATIVE NEGATIVE Final   Staphylococcus aureus NEGATIVE NEGATIVE Final    Comment: (NOTE) The Xpert SA Assay (FDA approved for NASAL specimens in patients 72 years of age and older), is one component of a comprehensive surveillance program. It is not intended to diagnose infection nor to guide or monitor treatment. Performed at [facility], [place] [address], [place], [place] [address]     Labs: CBC: Recent Labs  Lab 05/26/22 1215  WBC 10.5  NEUTROABS 8.6*  HGB 13.0  HCT 37.9  MCV 93.1  PLT 461   Basic Metabolic Panel: Recent Labs  Lab 05/26/22 1215 05/27/22 0410  NA 136 140  K 4.2 4.0  CL 104 109  CO2 22 25  GLUCOSE 241* 136*  BUN 37* 29*  CREATININE 1.58* 1.49*  CALCIUM 9.2 9.3  MG  --  1.6*   Liver Function Tests: No  results for input(s): "AST", "ALT", "ALKPHOS", "BILITOT", "PROT", "ALBUMIN" in the last 168 hours. CBG: Recent Labs  Lab 05/26/22 2248 05/27/22 0813 05/27/22 1236  GLUCAP 212* 131* 234*    Discharge time spent: greater than 30 minutes.  Signed: Patrecia Pour, MD [facility] 05/27/2022

## 2022-05-27 NOTE — Telephone Encounter (Addendum)
Per Dr. Debara Pickett, needs 2 week non live Zio monitor for bradycardia Of note, patient leaving for [facility] next Saturday Patient aware this will be mailed to her home Already has f/u 06/21/22 with Dr. Agustin Cree which Dr. Debara Pickett recommends to keep Thank you!  Addendum 4:29 PM - patient's Mg level came back low at 1.6 so Dr. Bonner Puna sent in MagOx 437m daily. However, patient had already been discharged. I called her to let her know, and recommended she discuss rechecking level with Dr. KAgustin Creewhen seen in followup. She was appreciative of call.

## 2022-05-27 NOTE — Consult Note (Signed)
Cardiology Consultation   Patient ID: Meagan Allen MRN: [id number]; DOB: 23-Oct-1955  Admit date: 05/26/2022 Date of Consult: 05/27/2022  PCP:  Meagan Ramsay, MD   [place] [facility] Cardiologist:  Meagan Campus, MD        Patient Profile:   Meagan Allen is a 66 y.o. female with a hx of NICM, HTN, DM, recent CKD stage 3b by labs (recent Cr 1.2-1.4), ataxia, anemia, anxiety, arthritis, thyroid CA, hypothyroidism, normal ABIs 2021, depression, fibromyalgia, GERD, hypercalcemia, brain meningioma, migraine, aortic atherosclerosis on CT, DM, ulcerative colitis who is being seen 05/27/2022 for the evaluation of bradycardia at the request of Meagan Allen  History of Present Illness:   Meagan Allen was first evaluated by Dr. Agustin Allen in 2019 for pre-op for shoulder surgery. Nuc showed EF 36% with abnormal defect, and echo corroborated low EF at 25-30%. She subsequently had [facility] 08/2017 showing normal coronaries, EF 35-40% by cath. She was started on medical therapy. Her EF gradually improved to the 40% range then last echo 10/2021 EF 50-55%, G1DD, mild LAE. At last OV her cardiac status was felt to be stable but she was seeing neurology for concern for sensory ataxia and unsteadiness. She has had progressive decline in her functional capacity and  and frequent near-falls in 2023 due to do this. She has lost about 50 lb doing modified keto diet. She has had unexplained decline in her renal function over the last year that she states her PCP has been watching. Prior Cr around 1.0, but ranging 1.2-1.4 over 2023. She reports she was taken off her Lasix for periods of a time with subsequent weight gain with further decline in GFR, improved when she went back on it. She reports she saw ENT without obvious dx. She states neurology did EMG and found 50% reduction in nerve response but no specific diagnosis made. She has a second opinion with a different neurologist scheduled later this  month. Labs by rheum in 04/2022 showed no M spike. She had Covid in July and had issues with rash and right arm pain migrating across chest periodically. She felt this was MSK as this this eventually resolved completely with steroid treatment. One night on 05/17/22 she had prolonged episode of sharp pain behind her left shoulder blade followed by 5 hours of diarrhea. She's been under the care of ophthalmology with monitoring of her inflammatory markers and subsequent need for temporal artery bx to evaluate for giant cell arteritis. She otherwise has not had any anginal-type chest pain or dyspnea. She does report a sense of easy muscle fatigueability. For example, sometimes at the end of a physically active day she would feel as though she can barely lift her legs to walk.   She underwent a temporal artery biopsy at [facility] on 05/25/22 under local anesthesia. During the procedure she reports she heard the alarm ringing every few seconds with HR dropping as low as 39. She does not think she was on a telemetry monitor at that time, but HR monitor. We do not have an op note. At one point she had some associated dizziness, nausea, and lightheadedness improved with repositioning her head downward. The procedure was completed. She continued to note some HR in the upper 40s-low 50s yesterday so messaged the office and was advised there was no appointment available so referred to the hospital for evaluation. She is going to [place] on vacation 11/18-11/25 so was hopeful for expedited evaluation. Last dose of metoprolol was  yesterday AM, 116m. Telemetry here has been reassuring with some nocturnal bradycardia in the mid-upper 40s but otherwise daytime HRs OK except for frequent PVCs and ventricular bigeminy. She does not feel overt palpitations. Labs notable for hsTroponin neg x2, BNP 142, normal Hgb, BUN 37/Cr 1.58, A1C 6.7, CRP 1.2, TSH wnl. EKG shows SB 51bpm with LBBB (similar to prior - though not formally called on  tracings before, had LVH with QRS widening). Her BP was soft at times yesterday. Her extensive home med list has not been resumed yet. She feels better today. Entresto, Lasix, metoprolol, rosuvastatin remain on hold.  Past Medical History:  Diagnosis Date   Anemia    no recent iron transfusion per pt on 05-16-2021   Anxiety    Arthritis    osteo, inflammatory arthritis and rheumatoid arthritis   Cancer (HCC)    thyroid cancer   CHF (congestive heart failure) ([facility]    diagnosed feb 2019   Deafness in right ear    Depression    Dysphagia 07/19/2020   occ   Dyspnea    on exertion on rare occasions   Enlarged liver    Expressive aphasia 03/15/2020   resolved per pt on 05-16-2021   Fibromyalgia    Gastroesophageal reflux disease with esophagitis without hemorrhage 07/19/2020   GERD (gastroesophageal reflux disease)    Headache disorder 03/15/2020   Heart murmur    High uric acid in 24 hour urine specimen 09/24/2019   History of colon polyps    History of corticosteroid therapy 07/13/2020   History of COVID-19 03/2021   mild symptoms x 5 days all symptoms resolved   History of thyroid cancer 2007;  2009   dx papillary thyroid cancer w/ mets to cervical lymph nodes   HOH (hard of hearing)    left ear   HTN (hypertension) 03/25/2012   Hypercalcemia 07/13/2020   Hyperlipidemia    Hypertension    Hypothyroidism 07/13/2020   IBS (irritable bowel syndrome)    constipation issues   Iron deficiency anemia 07/13/2020   Lesion of liver 07/19/2020   Meningioma ([place] 03/29/2021   8 mm left frontal lobe unchanged suspected 3 mm left ant temporal lobe memingioma unchanged in size per 03-29-2021 brain mri   Migraine    Mood changes    Morbid obesity ([facility] 07/13/2020   Nonischemic cardiomyopathy ([facility] cardiologist-  dr kEdyth Allen  per cardiac cath 08-28-2017  moderate LV dysfunction w/ diffuse hypocontractility ,  ef 35-40%   Optic nerve and pathway injury, left, initial encounter    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, per opthamologist black spot middle of left eye   PONV (postoperative nausea and vomiting)    last colonoscopy propfol bp dropped and vomiting   Post-surgical hypothyroidism    Primary localized osteoarthrosis of ankle and foot 02/13/2020   and hands   Rectal bleeding 05/02/2021   Rosacea    Thyroid cancer ([facility] 06/25/2006   surgery and radioactive iodine done   Type 2 diabetes mellitus ([place]    followed by pcp   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[facility]    Wears glasses    Wears hearing aid in both ears    left ear hearing aid, microphone in right ear    Past Surgical History:  Procedure Laterality Date   CARDIOVASCULAR STRESS TEST  08-20-2017   dr kAgustin Allen  High risk nuclear study w/ large irreverisible defect in the basal and mid inferoseptal, inferior, inferolateral walls and apical septal, inferior  walls with small peri infarct ischemia in the apical anterior & lateral walls (consistant w/ prior MI )/  no ST segment deviation noted /  nuclear stress ef 36%/  recommended cardiac cath   CARPOMETACARPAL [facility]) FUSION OF THUMB Bilateral 2003   COLONOSCOPY WITH PROPOFOL N/A 05/06/2013   Procedure: COLONOSCOPY WITH PROPOFOL;  Surgeon: Garlan Fair, MD;  Location: [facility];  Service: Endoscopy;  Laterality: N/A;   D & C HYSTEROSCOPY /  RESECTION POLYP/ ROLLER BALL ABLATION  02-26-2004   dr Kathyrn Drown  [facility]   DILATION AND CURETTAGE OF UTERUS  1984   EXCISION MORTON'S NEUROMA Left 1996   giant cell tumor  2011   Left ankle 2011   JOINT REPLACEMENT Left 05/2020   2 [facility] center of [place]   KNEE ARTHROSCOPY  08-16-2010  dr Beola Cord  [facility];  05/ 2012   right x2 left x1   LEFT HEART CATH AND CORONARY ANGIOGRAPHY N/A 08/28/2017   Procedure: LEFT HEART CATH AND CORONARY ANGIOGRAPHY;  Surgeon: Troy Sine, MD;  Location: [place] CV LAB;  Service: Cardiovascular;  Laterality: N/A;   large normal coronary arteries in a dominant RCA system;   moderate LV systoic dysfunction w/ diffuse hypocontractility (compatible w/ nonischemic cardiomyopathy), LV end diastoilc pressure normal, LVEF 35-45% by visual estimate   Left knee replaced   05/2010   NASAL LACRIMAL DUCT SURGERY  06/14/2009   REDO RIGHT MODIFIED RADICAL NECK DISSECTION  08-29-2007    [facility]   SHOULDER ARTHROSCOPY WITH ROTATOR CUFF REPAIR AND SUBACROMIAL DECOMPRESSION Left 10/17/2017   Procedure: Left shoulder mini open rotator cuff repair, subacromial decompression;  Surgeon: [name], MD;  Location: [facility];  Service: Orthopedics;  Laterality: Left;  Interscalene Block   TOTAL KNEE ARTHROPLASTY Right 06/03/2018   Procedure: RIGHT TOTAL KNEE ARTHROPLASTY;  Surgeon: Vickey Huger, MD;  Location: [facility];  Service: Orthopedics;  Laterality: Right;   TOTAL THYROIDECTOMY  2007  -- [facility]   w/ dissection lymph nodes   TRANSANAL HEMORRHOIDAL DEARTERIALIZATION N/A 05/20/2021   Procedure: TRANSANAL HEMORRHOIDAL DEARTERIALIZATION;  Surgeon: Leighton Ruff, MD;  Location: [facility];  Service: General;  Laterality: N/A;   TRANSTHORACIC ECHOCARDIOGRAM  08-20-2017  dr Meagan Allen   ef 25-30%, severe diffuse hypokinesis with no identifiable regional variations, but with profound dyssynchrony, due to arrhythmia insuffient to evaluate LV diastolic dysfunction/  trivial MR/  mild LAE/ Ventricular septum motion abnormal funtion,dyssynergy, & paradox   WRIST SURGERY Right 2001     Home Medications:  Prior to Admission medications   Medication Sig Start Date End Date Taking? Authorizing Provider  Allopurinol 200 MG TABS Take 200 mg by mouth daily. 07/14/21  Yes [provider]  aspirin EC 81 MG tablet Take 81 mg by mouth daily.   Yes [provider]  aspirin-acetaminophen-caffeine (EXCEDRIN MIGRAINE) [id number] MG tablet Take by mouth every 6 (six) hours as needed for headache.   Yes [provider]  buPROPion (WELLBUTRIN XL) 150 MG 24 hr tablet Take 450  mg by mouth every morning.  01/21/13  Yes [provider]  Calcium-Magnesium-Vitamin D (CALCIUM 1200+D3 PO) Take 1 tablet by mouth 2 (two) times daily. 05/11/19  Yes [provider]  cetirizine (ZYRTEC) 10 MG tablet Take 10 mg by mouth daily.   Yes [provider]  cinacalcet (SENSIPAR) 30 MG tablet Take 30 mg by mouth daily. Mon, Wed, Fri 08/26/19  Yes [provider]  clonazePAM (KLONOPIN) 0.5 MG tablet Take 0.5 mg by  mouth at bedtime.   Yes [provider]  furosemide (LASIX) 20 MG tablet Take 1 tablet (20 mg total) by mouth daily. Patient taking differently: Take 20 mg by mouth daily. Tue,thur,sun 08/17/21  Yes Park Liter, MD  HUMIRA PEN 40 MG/0.4ML PNKT Inject 40 mg into the skin every 14 (fourteen) days. 02/22/21  Yes [provider]  mesalamine (LIALDA) 1.2 g EC tablet Take 1.2 g by mouth at bedtime.   Yes [provider]  metFORMIN (GLUCOPHAGE-XR) 500 MG 24 hr tablet Take 500-1,000 mg by mouth every evening. 1,000 mg in the am and 500 mg at night   Yes [provider]  metoprolol succinate (TOPROL-XL) 100 MG 24 hr tablet TAKE 1 TABLET BY MOUTH DAILY WITH OR IMMEDIATELY FOLLOWING A MEAL. Patient taking differently: Take 100 mg by mouth daily. 08/16/18  Yes Park Liter, MD  Misc Natural Products (COSAMIN ASU ADVANCED FORMULA) CAPS Take 2 tablets by mouth daily as needed (gout flare). Unknown strength   Yes [provider]  nystatin (MYCOSTATIN/NYSTOP) powder Apply 1 application topically as needed (yeast).   Yes [provider]  pantoprazole (PROTONIX) 40 MG tablet Take 40 mg by mouth daily.   Yes [provider]  polyethylene glycol powder (GLYCOLAX/MIRALAX) powder Take 17 g by mouth daily as needed for mild constipation or moderate constipation (for constipation.). 02/24/13  Yes [provider]  rosuvastatin (CRESTOR) 5 MG tablet Take 5 mg by mouth 3 (three) times a week. Mon,  wed, fri 06/23/19  Yes [provider]  sacubitril-valsartan (ENTRESTO) 49-51 MG Take 1 tablet by mouth 2 (two) times daily. 06/06/21  Yes Park Liter, MD  SYNTHROID 112 MCG tablet Take 112 mcg by mouth at bedtime. 08/15/21  Yes [provider]  TRULANCE 3 MG TABS Take 1 tablet by mouth daily as needed (Irritral bowel). 03/14/21  Yes [provider]  Cholecalciferol (VITAMIN D) 50 MCG (2000 UT) CAPS Take 1 capsule by mouth daily. Patient not taking: Reported on 05/27/2022    [provider]  clindamycin (CLEOCIN T) 1 % external solution Apply 1 application topically 2 (two) times daily. Patient not taking: Reported on 05/27/2022 07/14/19   [provider]  CONTOUR NEXT TEST test strip 1 each by Other route as needed for other (Glucose test). 01/07/19   [provider]  doxycycline (VIBRAMYCIN) 100 MG capsule Take 100 mg by mouth as directed. Patient not taking: Reported on 05/27/2022 09/11/19   [provider]  TRULICITY 1.5 MA/0.0KH SOPN Inject 0.5 mLs as directed once a week. 09/19/21   [provider]    Inpatient Medications: Scheduled Meds:  enoxaparin (LOVENOX) injection  40 mg Subcutaneous Q24H   insulin aspart  0-15 Units Subcutaneous TID WC   insulin aspart  0-5 Units Subcutaneous QHS   predniSONE  60 mg Oral Q breakfast   Continuous Infusions:  PRN Meds: acetaminophen **OR** acetaminophen  Allergies:    Allergies  Allergen Reactions   Hydroxychloroquine Other (See Comments)    Caused blisters in mouth   Sulfa Antibiotics Hives, Nausea Only and Other (See Comments)    Other reaction(s): hives Other reaction(s): Other (See Comments) Other reaction(s): hives    Ace Inhibitors     Other reaction(s): fell out   Canagliflozin Other (See Comments)    Yeast issues.  Other reaction(s): yeast infections   Empagliflozin Other (See Comments)    Yeast issues.  Other reaction(s): yeast infections    Exenatide Other (  See Comments)    Yeast issues. Other reaction(s): yeast infection   Gabapentin Other (See Comments)    Not sure Other reaction(s): GI sx   Lisinopril Other (See Comments)    Pt is unsure of exact reaction   Methotrexate Other (See Comments)    Elevated LFT    Other Other (See Comments)   Pravastatin Other (See Comments)    Body pain.   Propofol Nausea And Vomiting and Other (See Comments)    Blood pressure bottomed out/syncope    Social History:   Social History   Socioeconomic History   Marital status: Married    Spouse name: Jeneen Rinks   Number of children: 2   Years of education: 12   Highest education level: Not on file  Occupational History   Occupation: Banker for [employer]  Tobacco Use   Smoking status: Former    Packs/day: 3.00    Years: 10.00    Total pack years: 30.00    Types: Cigarettes    Quit date: 07/17/1980    Years since quitting: 41.8    Passive exposure: Past   Smokeless tobacco: Never  Vaping Use   Vaping Use: Never used  Substance and Sexual Activity   Alcohol use: Yes    Comment: very occ   Drug use: No   Sexual activity: Yes    Birth control/protection: Post-menopausal  Other Topics Concern   Not on file  Social History Narrative   Tkai was born in [place], [place]. She moved to [place] in 1978 when her family moved to this state. Alexanderia currently lives in [place] with her husband of 26 years. They have 2 adult children and 1 grandson. 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   Social Determinants of Health   Financial Resource Strain: Not on file  Food Insecurity: No Food Insecurity (05/26/2022)   Hunger Vital Sign    Worried About Running Out of Food in the Last Year: Never true    Ran Out of Food in the Last Year: Never true  Transportation Needs: No Transportation Needs (05/26/2022)   PRAPARE - Radiographer, therapeutic (Medical): No    Lack of Transportation (Non-Medical): No  Physical Activity: Not on file  Stress: Not on file  Social Connections: Not on file  Intimate Partner Violence: Not At Risk (05/26/2022)   Humiliation, Afraid, Rape, and Kick questionnaire    Fear of Current or Ex-Partner: No    Emotionally Abused: No    Physically Abused: No    Sexually Abused: No    Family History:    Family History  Problem Relation Age of Onset   Heart disease Father        MI died age 67   Hypertension Father    Diabetes Father    Arthritis Mother        rheumatoid athritis   Stroke Mother    Depression Mother    Gout Brother    Cancer Brother        thyroid CA   Gout Brother    Gout Brother    Heart disease Sister        27 months old   Asthma Son    Breast cancer Neg Hx      ROS:  Please see the history of present illness.  All other ROS reviewed and negative.     Physical Exam/Data:  Vitals:   05/27/22 0052 05/27/22 0456 05/27/22 0809 05/27/22 0915  BP: 104/69 103/66 112/65 116/67  Pulse: (!) 45 68 (!) 58 (!) 56  Resp: 17 16 16 17   Temp: 97.7 F (36.5 C) 97.8 F (36.6 C) 98.2 F (36.8 C)   TempSrc: Oral Oral Oral   SpO2: 98% 95% 96% 96%  Weight:      Height:       No intake or output data in the 24 hours ending 05/27/22 1117    05/26/2022   10:41 PM 05/26/2022   11:54 AM 03/02/2022    3:40 PM  Last 3 Weights  Weight (lbs) 185 lb 14.4 oz 185 lb 183 lb  Weight (kg) 84.324 kg 83.915 kg 83.008 kg     Body mass index is 29.12 kg/m.  General: Well developed, well nourished WF, in no acute distress. Head: Normocephalic, atraumatic, sclera non-icteric, no xanthomas, nares are without discharge. Neck: Negative for carotid bruits. JVP not elevated. Lungs: Clear bilaterally to auscultation without wheezes, rales, or rhonchi. Breathing is unlabored. Heart: RRR S1 S2 occasional ectopy without murmurs, rubs, or gallops.  Abdomen: Soft, non-tender, non-distended  with normoactive bowel sounds. No rebound/guarding. Extremities: No clubbing or cyanosis. No edema. Distal pedal pulses are 2+ and equal bilaterally. Neuro: Alert and oriented X 3. Moves all extremities spontaneously. Psych:  Responds to questions appropriately with a normal affect.  lly reviewed and demonstrates:  SB 51bpm LBBB - prior LVH with QRS widening similar in the past Telemetry:  Telemetry was personally reviewed and demonstrates:  described above  Relevant CV Studies: 2d echo 10/17/21   1. Left ventricular ejection fraction, by estimation, is 50 to 55%. The  left ventricle has low normal function. The left ventricle has no regional  wall motion abnormalities. Left ventricular diastolic parameters are  consistent with Grade I diastolic  dysfunction (impaired relaxation).   2. Right ventricular systolic function is normal. The right ventricular  size is normal. There is normal pulmonary artery systolic pressure.   3. Left atrial size was mildly dilated.   4. The mitral valve is normal in structure. No evidence of mitral valve  regurgitation. No evidence of mitral stenosis.   5. The aortic valve is normal in structure. Aortic valve regurgitation is  not visualized. No aortic stenosis is present.   6. The inferior vena cava is normal in size with greater than 50%  respiratory variability, suggesting right atrial pressure of 3 mmHg.   Cath 2019 There is moderate left ventricular systolic dysfunction. LV end diastolic pressure is normal. The left ventricular ejection fraction is 35-45% by visual estimate.   Large normal coronary arteries in a dominant RCA system.   Moderate LV dysfunction with diffuse hypocontractility and an ejection fraction of 35-40%.  Findings are compatible with a nonischemic cardiomyopathy.   RECOMMENDATION: Guideline directed medical therapy for the patient's nonischemic cardiomyopathy.  The patient will return to the care of Dr. Agustin Allen.  Laboratory  Data:  High Sensitivity Troponin:   Recent Labs  Lab 05/26/22 1215 05/26/22 1428  TROPONINIHS 2 2     Chemistry Recent Labs  Lab 05/26/22 1215 05/27/22 0410  NA 136 140  K 4.2 4.0  CL 104 109  CO2 22 25  GLUCOSE 241* 136*  BUN 37* 29*  CREATININE 1.58* 1.49*  CALCIUM 9.2 9.3  GFRNONAA 36* 38*  ANIONGAP 10 6    No results for input(s): "PROT", "ALBUMIN", "AST", "ALT", "ALKPHOS", "BILITOT" in the last 168 hours. Lipids  No results for input(s): "CHOL", "TRIG", "HDL", "LABVLDL", "LDLCALC", "CHOLHDL" in the last 168 hours.  Hematology Recent Labs  Lab 05/26/22 1215  WBC 10.5  RBC 4.07  HGB 13.0  HCT 37.9  MCV 93.1  MCH 31.9  MCHC 34.3  RDW 15.8*  PLT 281   Thyroid  Recent Labs  Lab 05/26/22 1215  TSH 1.292    BNP Recent Labs  Lab 05/27/22 0410  BNP 142.3*    DDimer No results for input(s): "DDIMER" in the last 168 hours.   Radiology/Studies:  DG Chest Portable 1 View  Result Date: 05/26/2022 CLINICAL DATA:  Weakness. Bradycardia. Status post biopsy yesterday. EXAM: PORTABLE CHEST 1 VIEW COMPARISON:  06/24/2020 FINDINGS: Heart size and mediastinal contours are unremarkable. No pleural effusion or edema. No airspace opacity. Visualized osseous structures appear intact. IMPRESSION: No active disease. Electronically Signed   By: Kerby Moors M.D.   On: 05/26/2022 12:36     Assessment and Plan:   1. Bradycardia, frequent PVCs, in setting of known NICM - reported bradycardia down to 39bpm during temporal artery bx, but unknown at this time if this was on telemetry and true sinus bradycardia versus pseudobradycardia from the sinus rhythm with frequent PVCs we are seeing here on telemetry - no severe bradycardia or heart block noted on telemetry thus far, last dose metoprolol 182m yesterday AM - TSH OK, K normal, will get Mg level - the question is whether there is any unifying diagnosis to bring together the odd issues she has had in her history, including  the NICM and conduction disease - question whether she needs eval for infiltrative process or amyloid - may need to scale back med regimen for NICM given her tendency for low-normal BP/progressive renal decline this year in the setting of intentional weight loss as well - will discuss further management with MD  2. LBBB  - previously had QRS widening so not acutely different  3. Atypical chest/shoulder blade pain - troponins negative, initial symptoms improved with steroids - no typical anginal symptoms - normal cath 2019 - anticipate can continue to follow as OP  4. Constellation of neurologic, autoimmune symptoms, worsening renal function with progressive AKI on CKD stage 3b this year - agree with ongoing f/u with neurology, rheumatology - ? Any concern for myasthenia given her description of muscle fatiguability - defer any additional inpatient eval to MD - add Mg to labs   Risk Assessment/Risk Scores:      New York Heart Association (NYHA) Functional Class NYHA Class II-III   For questions or updates, please contact CBuffalo CenterPlease consult [email or web address] for contact info under    Signed, DCharlie Pitter PA-C  05/27/2022 11:17 AM

## 2022-05-29 ENCOUNTER — Ambulatory Visit (INDEPENDENT_AMBULATORY_CARE_PROVIDER_SITE_OTHER): Payer: BC Managed Care – PPO

## 2022-05-29 ENCOUNTER — Ambulatory Visit: Payer: BC Managed Care – PPO | Attending: Internal Medicine

## 2022-05-29 ENCOUNTER — Telehealth: Payer: Self-pay | Admitting: Cardiology

## 2022-05-29 DIAGNOSIS — R001 Bradycardia, unspecified: Secondary | ICD-10-CM | POA: Diagnosis not present

## 2022-05-29 NOTE — Telephone Encounter (Signed)
Pt would like a callback regarding EKG results. Please advise

## 2022-05-29 NOTE — Telephone Encounter (Signed)
Spoke with pt and advised that the monitor will help assess the left bundle branch block and that cardiology reviewed in the hospital ahd felt the morphology was the same. Pt verbalized understanding and had no additional questions.

## 2022-05-29 NOTE — Progress Notes (Unsigned)
Enrolled for [employer] to mail a ZIO XT long term holter monitor to the patients address on file.  

## 2022-05-31 NOTE — Progress Notes (Signed)
MRN : [id number]  Meagan Allen is a 66 y.o. (March 12, 1956) female who presents with chief complaint of check carotid arteries.  History of Present Illness: Patient is sent for evaluation of headache and the possible need for temporal artery biopsy.  She actually had her biopsy at [facility] (which was a very stressful difficult experience).  Patient notes the headache is located left side The patient describes the headache as very intense and sharp  The patient notes the headache is very severe saying that it is always greater than 8 out of 10 on the pain scale. The duration of the headache has been for more than 1 week. The patient notes timing of the headache is occurring on a daily basis frequently it waxes and wanes and can become quite intense more than once a day. There do not appear to be any aggravating symptoms. The patient notes rest and laying down helps a little Tylenol has not been effective.  Otherwise they have not been able to obtain relief. Patient does not have a history of migraine headaches. The patient notes that the headache is associated with left visual changes  ESR  05/18/2022  12.0 (<30) CRP  05/18/2022 12.2 (<5.0) Note these are after steroids had been started.  She is also concerned about varicose veins.  The patient notes aching and throbbing pain over the varicosities, particularly with prolonged dependent positions. The symptoms are significantly improved with elevation.    There is no history of DVT, PE or superficial thrombophlebitis. There is no history of ulceration or hemorrhage. The patient denies a significant family history of varicose veins.  The patient has not worn graduated compression in the past. At the present time the patient has not been using over-the-counter analgesics. There is no history of prior surgical intervention or sclerotherapy.  She is also voicing concern regarding her renal function.  No particular uremic symptoms at this  time but she is struggling with hyperparathyroidism.  Review of the chart shows on 05/26/2022 BUN/Cr 37/1.58  No outpatient medications have been marked as taking for the 06/01/22 encounter (Appointment) with Delana Meyer, Dolores Lory, MD.    Past Medical History:  Diagnosis Date   Anemia    no recent iron transfusion per pt on 05-16-2021   Anxiety    Arthritis    osteo, inflammatory arthritis and rheumatoid arthritis   Cancer ([place])    thyroid cancer   CHF (congestive heart failure) ([facility])    diagnosed feb 2019   Deafness in right ear    Depression    Dysphagia 07/19/2020   occ   Dyspnea    on exertion on rare occasions   Enlarged liver    Expressive aphasia 03/15/2020   resolved per pt on 05-16-2021   Fibromyalgia    Gastroesophageal reflux disease with esophagitis without hemorrhage 07/19/2020   GERD (gastroesophageal reflux disease)    Headache disorder 03/15/2020   Heart murmur    High uric acid in 24 hour urine specimen 09/24/2019   History of colon polyps    History of corticosteroid therapy 07/13/2020   History of COVID-19 03/2021   mild symptoms x 5 days all symptoms resolved   History of thyroid cancer 2007;  2009   dx papillary thyroid cancer w/ mets to cervical lymph nodes   HOH (hard of hearing)    left ear   HTN (hypertension) 03/25/2012   Hypercalcemia 07/13/2020   Hyperlipidemia    Hypertension    Hypothyroidism 07/13/2020  IBS (irritable bowel syndrome)    constipation issues   Iron deficiency anemia 07/13/2020   Lesion of liver 07/19/2020   Meningioma ([place]) 03/29/2021   8 mm left frontal lobe unchanged suspected 3 mm left ant temporal lobe memingioma unchanged in size per 03-29-2021 brain mri   Migraine    Mood changes    Morbid obesity ([place]) 07/13/2020   Nonischemic cardiomyopathy [facility]) cardiologist-  dr Edyth Gunnels   per cardiac cath 08-28-2017  moderate LV dysfunction w/ diffuse hypocontractility ,  ef 35-40%   Optic nerve and pathway injury, left,  initial encounter    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, per opthamologist black spot middle of left eye   PONV (postoperative nausea and vomiting)    last colonoscopy propfol bp dropped and vomiting   Post-surgical hypothyroidism    Primary localized osteoarthrosis of ankle and foot 02/13/2020   and hands   Rectal bleeding 05/02/2021   Rosacea    Thyroid cancer ([place]) 06/25/2006   surgery and radioactive iodine done   Type 2 diabetes mellitus ([place])    followed by pcp   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[place])    Wears glasses    Wears hearing aid in both ears    left ear hearing aid, microphone in right ear    Past Surgical History:  Procedure Laterality Date   CARDIOVASCULAR STRESS TEST  08-20-2017   dr Agustin Cree   High risk nuclear study w/ large irreverisible defect in the basal and mid inferoseptal, inferior, inferolateral walls and apical septal, inferior walls with small peri infarct ischemia in the apical anterior & lateral walls (consistant w/ prior MI )/  no ST segment deviation noted /  nuclear stress ef 36%/  recommended cardiac cath   CARPOMETACARPAL [facility]) FUSION OF THUMB Bilateral 2003   COLONOSCOPY WITH PROPOFOL N/A 05/06/2013   Procedure: COLONOSCOPY WITH PROPOFOL;  Surgeon: Garlan Fair, MD;  Location: [facility];  Service: Endoscopy;  Laterality: N/A;   D & C HYSTEROSCOPY /  RESECTION POLYP/ ROLLER BALL ABLATION  02-26-2004   dr Kathyrn Drown  [facility]   DILATION AND CURETTAGE OF UTERUS  1984   EXCISION MORTON'S NEUROMA Left 1996   giant cell tumor  2011   Left ankle 2011   JOINT REPLACEMENT Left 05/2020   2 [facility] [place]   KNEE ARTHROSCOPY  08-16-2010  dr Beola Cord  [facility];  05/ 2012   right x2 left x1   LEFT HEART CATH AND CORONARY ANGIOGRAPHY N/A 08/28/2017   Procedure: LEFT HEART CATH AND CORONARY ANGIOGRAPHY;  Surgeon: Troy Sine, MD;  Location: [facility];  Service: Cardiovascular;  Laterality: N/A;   large normal coronary arteries in a  dominant RCA system;  moderate LV systoic dysfunction w/ diffuse hypocontractility (compatible w/ nonischemic cardiomyopathy), LV end diastoilc pressure normal, LVEF 35-45% by visual estimate   Left knee replaced   05/2010   NASAL LACRIMAL DUCT SURGERY  06/14/2009   REDO RIGHT MODIFIED RADICAL NECK DISSECTION  08-29-2007    [facility]   SHOULDER ARTHROSCOPY WITH ROTATOR CUFF REPAIR AND SUBACROMIAL DECOMPRESSION Left 10/17/2017   Procedure: Left shoulder mini open rotator cuff repair, subacromial decompression;  Surgeon: [name], MD;  Location: [facility];  Service: Orthopedics;  Laterality: Left;  Interscalene Block   TOTAL KNEE ARTHROPLASTY Right 06/03/2018   Procedure: RIGHT TOTAL KNEE ARTHROPLASTY;  Surgeon: Vickey Huger, MD;  Location: [facility];  Service: Orthopedics;  Laterality: Right;   TOTAL THYROIDECTOMY  2007  -- [facility]  w/ dissection lymph nodes   TRANSANAL HEMORRHOIDAL DEARTERIALIZATION N/A 05/20/2021   Procedure: TRANSANAL HEMORRHOIDAL DEARTERIALIZATION;  Surgeon: Leighton Ruff, MD;  Location: [facility];  Service: General;  Laterality: N/A;   TRANSTHORACIC ECHOCARDIOGRAM  08-20-2017  dr Agustin Cree   ef 25-30%, severe diffuse hypokinesis with no identifiable regional variations, but with profound dyssynchrony, due to arrhythmia insuffient to evaluate LV diastolic dysfunction/  trivial MR/  mild LAE/ Ventricular septum motion abnormal funtion,dyssynergy, & paradox   WRIST SURGERY Right 2001    Social History Social History   Tobacco Use   Smoking status: Former    Packs/day: 3.00    Years: 10.00    Total pack years: 30.00    Types: Cigarettes    Quit date: 07/17/1980    Years since quitting: 41.8    Passive exposure: Past   Smokeless tobacco: Never  Vaping Use   Vaping Use: Never used  Substance Use Topics   Alcohol use: Yes    Comment: very occ   Drug use: No    Family History Family History  Problem Relation Age of Onset   Heart disease Father        MI  died age 36   Hypertension Father    Diabetes Father    Arthritis Mother        rheumatoid athritis   Stroke Mother    Depression Mother    Gout Brother    Cancer Brother        thyroid CA   Gout Brother    Gout Brother    Heart disease Sister        47 months old   Asthma Son    Breast cancer Neg Hx     Allergies  Allergen Reactions   Hydroxychloroquine Other (See Comments)    blisters in mouth   Sulfa Antibiotics Hives        Ace Inhibitors Other (See Comments)    fell out   Canagliflozin Other (See Comments)    Yeast issues.   Empagliflozin Other (See Comments)    Yeast issues.   Exenatide Other (See Comments)    Yeast issues.   Gabapentin Nausea And Vomiting   Lisinopril Other (See Comments)    Pt is unsure of exact reaction   Methotrexate Other (See Comments)    Elevated LFT    Pravastatin Other (See Comments)    Body pain.   Propofol Nausea And Vomiting and Other (See Comments)    Blood pressure bottomed out/syncope     REVIEW OF SYSTEMS (Negative unless checked)  Constitutional: _0 Weight loss  _1 Fever  _2 Chills Cardiac: _3 Chest pain   _4 Chest pressure   _5 Palpitations   _6 Shortness of breath when laying flat   _7 Shortness of breath with exertion. Vascular:  _8 Pain in legs with walking   _9 Pain in legs at rest  _10 History of DVT   _11 Phlebitis   _12 Swelling in legs   _13 Varicose veins   _14 Non-healing ulcers Pulmonary:   _15 Uses home oxygen   _16 Productive cough   _17 Hemoptysis   _18 Wheeze  _19 COPD   _20 Asthma Neurologic:  _21 Dizziness   _22 Seizures   _23 History of stroke   _24 History of TIA  _25 Aphasia   _26 Vissual changes   _27 Weakness or numbness in arm   _28 Weakness or numbness in leg Musculoskeletal:   _29 Joint swelling   _30 Joint pain   _31 Low back pain Hematologic:  _32 Easy bruising  _33 Easy bleeding   _34 Hypercoagulable state   _35 Anemic Gastrointestinal:  _36 Diarrhea   _37 Vomiting  [  x]Gastroesophageal reflux/heartburn   _0 Difficulty swallowing. Genitourinary:  _1 Chronic  kidney disease   _2 Difficult urination  _3 Frequent urination   _4 Blood in urine Skin:  _5 Rashes   _6 Ulcers  Psychological:  _7 History of anxiety   _8  History of major depression.  Physical Examination  There were no vitals filed for this visit. There is no height or weight on file to calculate BMI. Gen: WD/WN, NAD Head: [place]/AT, No temporalis wasting.  Ear/Nose/Throat: Hearing grossly intact, nares w/o erythema or drainage Eyes: PER, EOMI, sclera nonicteric.  Neck: Supple, no masses.  No bruit or JVD.  Pulmonary:  Good air movement, no audible wheezing, no use of accessory muscles.  Cardiac: RRR, normal S1, S2, no Murmurs. Vascular:  no carotid bruit noted incision left temple CD&I Vessel Right Left  Radial Palpable Palpable  Carotid  Palpable  Palpable  Gastrointestinal: soft, non-distended. No guarding/no peritoneal signs.  Musculoskeletal: M/S 5/5 throughout.  No visible deformity.  Neurologic: CN 2-12 intact. Pain and light touch intact in extremities.  Symmetrical.  Speech is fluent. Motor exam as listed above. Psychiatric: Judgment intact, Mood & affect appropriate for pt's clinical situation. Dermatologic: No rashes or ulcers noted.  No changes consistent with cellulitis.   CBC Lab Results  Component Value Date   WBC 10.5 05/26/2022   HGB 13.0 05/26/2022   HCT 37.9 05/26/2022   MCV 93.1 05/26/2022   PLT 281 05/26/2022    BMET    Component Value Date/Time   NA 140 05/27/2022 0410   NA 143 06/06/2019 1512   K 4.0 05/27/2022 0410   CL 109 05/27/2022 0410   CO2 25 05/27/2022 0410   GLUCOSE 136 (H) 05/27/2022 0410   BUN 29 (H) 05/27/2022 0410   BUN 16 06/06/2019 1512   CREATININE 1.49 (H) 05/27/2022 0410   CREATININE 0.81 01/15/2019 1336   CALCIUM 9.3 05/27/2022 0410   GFRNONAA 38 (L) 05/27/2022 0410   GFRNONAA 77 01/15/2019 1336   GFRAA 67 06/06/2019 1512   GFRAA 89 01/15/2019 1336   Estimated Creatinine Clearance: 40.9 mL/min (A) (by C-G formula based on SCr  of 1.49 mg/dL (H)).  COAG Lab Results  Component Value Date   INR 0.9 08/22/2017    Radiology DG Chest Portable 1 View  Result Date: 05/26/2022 CLINICAL DATA:  Weakness. Bradycardia. Status post biopsy yesterday. EXAM: PORTABLE CHEST 1 VIEW COMPARISON:  06/24/2020 FINDINGS: Heart size and mediastinal contours are unremarkable. No pleural effusion or edema. No airspace opacity. Visualized osseous structures appear intact. IMPRESSION: No active disease. Electronically Signed   By: Kerby Moors M.D.   On: 05/26/2022 12:36     Assessment/Plan 1. Headache disorder The patient is s/p temporal artery biopsy which is negative.  No indication for right sided biopsy which was discussed with the patient.  Follow up with rheumatology.   2. Varicose veins of both lower extremities with inflammation Recommend:  The patient is complaining of varicose veins.    I have had a long discussion with the patient regarding  varicose veins and why they cause symptoms.  Patient will begin wearing graduated compression stockings on a daily basis, beginning first thing in the morning and removing them in the evening. The patient is instructed specifically not to sleep in the stockings.    The patient  will not use NSAID's given her renal function. Tylenol is appropriate to help control the symptoms as needed.    In addition, behavioral modification including elevation during the day will be initiated, utilizing a recliner  was recommended.  The patient is also instructed to continue exercising such as walking 4-5 times per week.  At this time the patient wishes to continue conservative therapy and is not interested in more invasive treatments such as laser ablation and sclerotherapy.  The Patient will follow up PRN if the symptoms worsen.  3. Renal insufficiency The patient has moderate renal disease.  However, at the present time the patient is not having any uremic symptoms and is not yet on  dialysis.  Avoid nephrotoxic medications and dehydration.  Referral to nephrology.  4. Hyperparathyroidism ([place]) See #3  5. Arthritis Continue medications as already ordered, these medications have been reviewed and there are no changes at this time.  Continued activity and therapy was stressed.  6. Mixed hyperlipidemia Continue statin as ordered and reviewed, no changes at this time  7. Primary hypertension Continue antihypertensive medications as already ordered, these medications have been reviewed and there are no changes at this time.  8. Gastroesophageal reflux disease, unspecified whether esophagitis present Continue PPI as already ordered, this medication has been reviewed and there are no changes at this time.  Avoidence of caffeine and alcohol  Moderate elevation of the head of the bed    Hortencia Pilar, MD  05/31/2022 3:02 PM

## 2022-06-01 ENCOUNTER — Encounter (INDEPENDENT_AMBULATORY_CARE_PROVIDER_SITE_OTHER): Payer: Self-pay | Admitting: Vascular Surgery

## 2022-06-01 ENCOUNTER — Ambulatory Visit (INDEPENDENT_AMBULATORY_CARE_PROVIDER_SITE_OTHER): Payer: BC Managed Care – PPO | Admitting: Vascular Surgery

## 2022-06-01 VITALS — BP 102/64 | HR 47 | Resp 17 | Ht 67.0 in | Wt 186.6 lb

## 2022-06-01 DIAGNOSIS — N289 Disorder of kidney and ureter, unspecified: Secondary | ICD-10-CM

## 2022-06-01 DIAGNOSIS — I8311 Varicose veins of right lower extremity with inflammation: Secondary | ICD-10-CM | POA: Diagnosis not present

## 2022-06-01 DIAGNOSIS — M1990 Unspecified osteoarthritis, unspecified site: Secondary | ICD-10-CM

## 2022-06-01 DIAGNOSIS — R519 Headache, unspecified: Secondary | ICD-10-CM

## 2022-06-01 DIAGNOSIS — E782 Mixed hyperlipidemia: Secondary | ICD-10-CM

## 2022-06-01 DIAGNOSIS — E213 Hyperparathyroidism, unspecified: Secondary | ICD-10-CM | POA: Diagnosis not present

## 2022-06-01 DIAGNOSIS — I8312 Varicose veins of left lower extremity with inflammation: Secondary | ICD-10-CM

## 2022-06-01 DIAGNOSIS — K219 Gastro-esophageal reflux disease without esophagitis: Secondary | ICD-10-CM

## 2022-06-01 DIAGNOSIS — I10 Essential (primary) hypertension: Secondary | ICD-10-CM

## 2022-06-04 ENCOUNTER — Encounter (INDEPENDENT_AMBULATORY_CARE_PROVIDER_SITE_OTHER): Payer: Self-pay | Admitting: Vascular Surgery

## 2022-06-04 DIAGNOSIS — N289 Disorder of kidney and ureter, unspecified: Secondary | ICD-10-CM | POA: Insufficient documentation

## 2022-06-04 DIAGNOSIS — E213 Hyperparathyroidism, unspecified: Secondary | ICD-10-CM | POA: Insufficient documentation

## 2022-06-04 DIAGNOSIS — I8312 Varicose veins of left lower extremity with inflammation: Secondary | ICD-10-CM | POA: Insufficient documentation

## 2022-06-04 DIAGNOSIS — I8311 Varicose veins of right lower extremity with inflammation: Secondary | ICD-10-CM | POA: Insufficient documentation

## 2022-06-12 ENCOUNTER — Ambulatory Visit (INDEPENDENT_AMBULATORY_CARE_PROVIDER_SITE_OTHER): Payer: BC Managed Care – PPO | Admitting: Diagnostic Neuroimaging

## 2022-06-12 ENCOUNTER — Encounter: Payer: Self-pay | Admitting: Diagnostic Neuroimaging

## 2022-06-12 VITALS — BP 119/67 | HR 94 | Ht 67.0 in | Wt 185.0 lb

## 2022-06-12 DIAGNOSIS — G959 Disease of spinal cord, unspecified: Secondary | ICD-10-CM | POA: Diagnosis not present

## 2022-06-12 DIAGNOSIS — G629 Polyneuropathy, unspecified: Secondary | ICD-10-CM | POA: Diagnosis not present

## 2022-06-12 NOTE — Progress Notes (Unsigned)
[facility]  PATIENT: Meagan Allen DOB: 05-18-1956  REFERRING CLINICIAN: Leonel Ramsay, MD HISTORY FROM: patient  REASON FOR VISIT: new consult   HISTORICAL  CHIEF COMPLAINT:  Chief Complaint  Patient presents with   New Patient (Initial Visit)    Pt alone, rm 7. Pt has struggled with balance 04/2021 she lost 50 lbs. She states that she has had several falls the latest one was thanksgiving. She was told that she has lost 50% of muscle and nerve. His treatment was take a baby aspirin (which she already does) and walk more. As the day goes on she will have a hard time lifting legs. She states that numbness in RLE and Right pinky and ring finger. Referred here for a 2nd opinion   Other    She started having headache and they were concerned for temporal arteritis and she has a follow up for that's scheduled. She has been on high dose steroids. They completed a biopsy on left side. She presents today for a 2nd opinion of these concerns.    HISTORY OF PRESENT ILLNESS:   66 year old female here for evaluation of gait and balance difficulty and headaches.  October 2022 patient started a new diet and successfully lost 50 pounds.  July 2023 she had COVID and developed some numbness on the right hand and bilateral feet.  Also developed some rash.  More recently she had a trip to [place] and was having more balance and gait difficulty.  This been going on for past 10 years but worsened recently.  She has had several falls.  Also was having some issues with headaches, saw a rheumatology and had elevated ESR, was treated for temporal arteritis.  She had a temporal artery biopsy which was negative but clinical symptoms and lab testing, and favorable response to prednisone were noted so she was continued on empiric treatment.  She is gradually being weaned off.   REVIEW OF SYSTEMS: Full 14 system review of systems performed and negative with exception of: as per  HPI.  ALLERGIES: Allergies  Allergen Reactions   Hydroxychloroquine Other (See Comments)    blisters in mouth   Sulfa Antibiotics Hives        Ace Inhibitors Other (See Comments)    fell out   Canagliflozin Other (See Comments)    Yeast issues.   Empagliflozin Other (See Comments)    Yeast issues.   Exenatide Other (See Comments)    Yeast issues.   Gabapentin Nausea And Vomiting   Lisinopril Other (See Comments)    Pt is unsure of exact reaction   Methotrexate Other (See Comments)    Elevated LFT    Pravastatin Other (See Comments)    Body pain.   Propofol Nausea And Vomiting and Other (See Comments)    Blood pressure bottomed out/syncope    HOME MEDICATIONS: Outpatient Medications Prior to Visit  Medication Sig Dispense Refill   allopurinol (ZYLOPRIM) 300 MG tablet Take 300 mg by mouth daily.     aspirin EC 81 MG tablet Take 81 mg by mouth daily.     aspirin-acetaminophen-caffeine (EXCEDRIN MIGRAINE) 250-250-65 MG tablet Take 1 tablet by mouth every 6 (six) hours as needed for headache.     buPROPion (WELLBUTRIN XL) 150 MG 24 hr tablet Take 450 mg by mouth every morning.      cetirizine (ZYRTEC) 10 MG tablet Take 10 mg by mouth daily.     cinacalcet (SENSIPAR) 30 MG tablet Take 30 mg by  mouth 3 (three) times a week. Mon, Wed, Fri     clindamycin (CLEOCIN T) 1 % external solution Apply 1 application  topically 2 (two) times daily as needed.     clonazePAM (KLONOPIN) 0.5 MG tablet Take 0.5 mg by mouth at bedtime.     doxycycline (VIBRAMYCIN) 100 MG capsule Take 100 mg by mouth as directed.     furosemide (LASIX) 20 MG tablet Take 1 tablet (20 mg total) by mouth daily. (Patient taking differently: Take 20 mg by mouth 3 (three) times a week. [place] Thurs, Sun) 90 tablet 3   magnesium oxide (MAG-OX) 400 MG tablet Take 1 tablet (400 mg total) by mouth daily. 30 tablet 0   Melatonin 10 MG CAPS Take 10 mg by mouth at bedtime.     mesalamine (LIALDA) 1.2 g EC tablet Take 1.2 g by  mouth at bedtime.     metFORMIN (GLUCOPHAGE-XR) 500 MG 24 hr tablet Take 500-1,000 mg by mouth See admin instructions. Take 2 tablets (1,000 mg) in the morning and 1 tablet (500 mg) at bedtime     Misc Natural Products (COSAMIN ASU ADVANCED FORMULA) CAPS Take 2 tablets by mouth daily as needed (gout flare). Unknown strength     mometasone (ELOCON) 0.1 % lotion Apply topically 2 (two) times daily as needed.     nystatin (MYCOSTATIN/NYSTOP) powder Apply 1 application topically as needed (yeast).     pantoprazole (PROTONIX) 40 MG tablet Take 40 mg by mouth daily.     polyethylene glycol powder (GLYCOLAX/MIRALAX) powder Take 17 g by mouth daily as needed for mild constipation or moderate constipation (for constipation.).     predniSONE (DELTASONE) 20 MG tablet Take 50 mg by mouth daily with breakfast.     rosuvastatin (CRESTOR) 5 MG tablet Take 5 mg by mouth 3 (three) times a week. Mon, Wed, Fri     sacubitril-valsartan (ENTRESTO) 49-51 MG Take 1 tablet by mouth 2 (two) times daily. 180 tablet 3   SYNTHROID 112 MCG tablet Take 112 mcg by mouth at bedtime.     TRULANCE 3 MG TABS Take 1 tablet by mouth daily as needed (IBS).     Calcium-Magnesium-Vitamin D (CALCIUM 1200+D3 PO) Take 1 tablet by mouth 2 (two) times daily. (Patient not taking: Reported on 06/01/2022)     Cholecalciferol (VITAMIN D) 50 MCG (2000 UT) CAPS Take 1 capsule by mouth daily. (Patient not taking: Reported on 05/27/2022)     HUMIRA PEN 40 MG/0.4ML PNKT Inject 40 mg into the skin every 14 (fourteen) days. (Patient not taking: Reported on 05/27/2022)     No facility-administered medications prior to visit.    PAST MEDICAL HISTORY: Past Medical History:  Diagnosis Date   Anemia    no recent iron transfusion per pt on 05-16-2021   Anxiety    Arthritis    osteo, inflammatory arthritis and rheumatoid arthritis   Cancer (HCC)    thyroid cancer   CHF (congestive heart failure) ([place])    diagnosed feb 2019   Deafness in right ear     Depression    Dysphagia 07/19/2020   occ   Dyspnea    on exertion on rare occasions   Enlarged liver    Expressive aphasia 03/15/2020   resolved per pt on 05-16-2021   Fibromyalgia    Gastroesophageal reflux disease with esophagitis without hemorrhage 07/19/2020   GERD (gastroesophageal reflux disease)    Headache disorder 03/15/2020   Heart murmur    High uric acid in  24 hour urine specimen 09/24/2019   History of colon polyps    History of corticosteroid therapy 07/13/2020   History of COVID-19 03/2021   mild symptoms x 5 days all symptoms resolved   History of thyroid cancer 2007;  2009   dx papillary thyroid cancer w/ mets to cervical lymph nodes   HOH (hard of hearing)    left ear   HTN (hypertension) 03/25/2012   Hypercalcemia 07/13/2020   Hyperlipidemia    Hypertension    Hypothyroidism 07/13/2020   IBS (irritable bowel syndrome)    constipation issues   Iron deficiency anemia 07/13/2020   Lesion of liver 07/19/2020   Meningioma ([place]) 03/29/2021   8 mm left frontal lobe unchanged suspected 3 mm left ant temporal lobe memingioma unchanged in size per 03-29-2021 brain mri   Migraine    Mood changes    Morbid obesity ([place]) 07/13/2020   Nonischemic cardiomyopathy [facility]) cardiologist-  dr Edyth Gunnels   per cardiac cath 08-28-2017  moderate LV dysfunction w/ diffuse hypocontractility ,  ef 35-40%   Optic nerve and pathway injury, left, initial encounter    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, per opthamologist black spot middle of left eye   PONV (postoperative nausea and vomiting)    last colonoscopy propfol bp dropped and vomiting   Post-surgical hypothyroidism    Primary localized osteoarthrosis of ankle and foot 02/13/2020   and hands   Rectal bleeding 05/02/2021   Rosacea    Thyroid cancer ([place]) 06/25/2006   surgery and radioactive iodine done   Type 2 diabetes mellitus ([place])    followed by pcp   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[place])    Wears glasses     Wears hearing aid in both ears    left ear hearing aid, microphone in right ear    PAST SURGICAL HISTORY: Past Surgical History:  Procedure Laterality Date   CARDIOVASCULAR STRESS TEST  08-20-2017   dr Agustin Cree   High risk nuclear study w/ large irreverisible defect in the basal and mid inferoseptal, inferior, inferolateral walls and apical septal, inferior walls with small peri infarct ischemia in the apical anterior & lateral walls (consistant w/ prior MI )/  no ST segment deviation noted /  nuclear stress ef 36%/  recommended cardiac cath   CARPOMETACARPAL [facility]) FUSION OF THUMB Bilateral 2003   COLONOSCOPY WITH PROPOFOL N/A 05/06/2013   Procedure: COLONOSCOPY WITH PROPOFOL;  Surgeon: Garlan Fair, MD;  Location: [facility];  Service: Endoscopy;  Laterality: N/A;   D & C HYSTEROSCOPY /  RESECTION POLYP/ ROLLER BALL ABLATION  02-26-2004   dr Kathyrn Drown  [facility]   DILATION AND CURETTAGE OF UTERUS  1984   EXCISION MORTON'S NEUROMA Left 1996   giant cell tumor  2011   Left ankle 2011   JOINT REPLACEMENT Left 05/2020   2 [facility] of [place]   KNEE ARTHROSCOPY  08-16-2010  dr Beola Cord  [facility];  05/ 2012   right x2 left x1   LEFT HEART CATH AND CORONARY ANGIOGRAPHY N/A 08/28/2017   Procedure: LEFT HEART CATH AND CORONARY ANGIOGRAPHY;  Surgeon: Troy Sine, MD;  Location: [facility];  Service: Cardiovascular;  Laterality: N/A;   large normal coronary arteries in a dominant RCA system;  moderate LV systoic dysfunction w/ diffuse hypocontractility (compatible w/ nonischemic cardiomyopathy), LV end diastoilc pressure normal, LVEF 35-45% by visual estimate   Left knee replaced   05/2010   NASAL LACRIMAL DUCT SURGERY  06/14/2009   REDO  RIGHT MODIFIED RADICAL NECK DISSECTION  08-29-2007    [facility]   SHOULDER ARTHROSCOPY WITH ROTATOR CUFF REPAIR AND SUBACROMIAL DECOMPRESSION Left 10/17/2017   Procedure: Left shoulder mini open rotator cuff repair, subacromial decompression;  Surgeon:  [name], MD;  Location: [facility];  Service: Orthopedics;  Laterality: Left;  Interscalene Block   TOTAL KNEE ARTHROPLASTY Right 06/03/2018   Procedure: RIGHT TOTAL KNEE ARTHROPLASTY;  Surgeon: Vickey Huger, MD;  Location: [facility];  Service: Orthopedics;  Laterality: Right;   TOTAL THYROIDECTOMY  2007  -- [facility]   w/ dissection lymph nodes   TRANSANAL HEMORRHOIDAL DEARTERIALIZATION N/A 05/20/2021   Procedure: TRANSANAL HEMORRHOIDAL DEARTERIALIZATION;  Surgeon: Leighton Ruff, MD;  Location: [facility];  Service: General;  Laterality: N/A;   TRANSTHORACIC ECHOCARDIOGRAM  08-20-2017  dr Agustin Cree   ef 25-30%, severe diffuse hypokinesis with no identifiable regional variations, but with profound dyssynchrony, due to arrhythmia insuffient to evaluate LV diastolic dysfunction/  trivial MR/  mild LAE/ Ventricular septum motion abnormal funtion,dyssynergy, & paradox   WRIST SURGERY Right 2001    FAMILY HISTORY: Family History  Problem Relation Age of Onset   Heart disease Father        MI died age 15   Hypertension Father    Diabetes Father    Arthritis Mother        rheumatoid athritis   Stroke Mother    Depression Mother    Gout Brother    Cancer Brother        thyroid CA   Gout Brother    Gout Brother    Heart disease Sister        37 months old   Asthma Son    Breast cancer Neg Hx     SOCIAL HISTORY: Social History   Socioeconomic History   Marital status: Married    Spouse name: Jeneen Rinks   Number of children: 2   Years of education: 12   Highest education level: Not on file  Occupational History   Occupation: Banker for [employer]    Employer: [employer]  Tobacco Use   Smoking status: Former    Packs/day: 3.00    Years: 10.00    Total pack years: 30.00    Types: Cigarettes    Quit date: 07/17/1980    Years since quitting: 41.9    Passive exposure: Past   Smokeless tobacco: Never  Vaping Use   Vaping Use: Never used  Substance  and Sexual Activity   Alcohol use: Yes    Comment: very occ   Drug use: No   Sexual activity: Yes    Birth control/protection: Post-menopausal  Other Topics Concern   Not on file  Social History Narrative   Blakely was born in [place], [place]. She moved to [place] in 1978 when her family moved to this state. Nasira currently lives in [place] with her husband of 44 years. They have 2 adult children and 1 grandson. 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   Social Determinants of Health   Financial Resource Strain: Not on file  Food Insecurity: No Food Insecurity (05/26/2022)   Hunger Vital Sign    Worried About Running Out of Food in the Last Year: Never true    Ran Out of Food in the Last Year: Never true  Transportation Needs: No Transportation Needs (05/26/2022)   PRAPARE - Transportation    Lack of Transportation (Medical): No    Lack of  Transportation (Non-Medical): No  Physical Activity: Not on file  Stress: Not on file  Social Connections: Not on file  Intimate Partner Violence: Not At Risk (05/26/2022)   Humiliation, Afraid, Rape, and Kick questionnaire    Fear of Current or Ex-Partner: No    Emotionally Abused: No    Physically Abused: No    Sexually Abused: No     PHYSICAL EXAM  GENERAL EXAM/CONSTITUTIONAL: Vitals:  Vitals:   06/12/22 1009  BP: 119/67  Pulse: 94  Weight: 185 lb (83.9 kg)  Height: _0  (1.702 m)   Body mass index is 28.98 kg/m. Wt Readings from Last 3 Encounters:  06/12/22 185 lb (83.9 kg)  06/01/22 186 lb 9.6 oz (84.6 kg)  05/26/22 185 lb 14.4 oz (84.3 kg)   Patient is in no distress; well developed, nourished and groomed; neck is supple  CARDIOVASCULAR: Examination of carotid arteries is normal; no carotid bruits Regular rate and rhythm, no murmurs Examination of peripheral vascular system by observation and palpation is normal  EYES: Ophthalmoscopic exam of optic discs and posterior segments  is normal; no papilledema or hemorrhages No results found.  MUSCULOSKELETAL: Gait, strength, tone, movements noted in Neurologic exam below  NEUROLOGIC: MENTAL STATUS:      No data to display         awake, alert, oriented to person, place and time recent and remote memory intact normal attention and concentration language fluent, comprehension intact, naming intact fund of knowledge appropriate  CRANIAL NERVE:  2nd - no papilledema on fundoscopic exam 2nd, 3rd, 4th, 6th - pupils equal and reactive to light, visual fields full to confrontation, extraocular muscles intact, no nystagmus 5th - facial sensation symmetric 7th - facial strength symmetric 8th - hearing intact 9th - palate elevates symmetrically, uvula midline 11th - shoulder shrug symmetric 12th - tongue protrusion midline  MOTOR:  normal bulk and tone, full strength in the BUE, BLE  SENSORY:  normal and symmetric to light touch, temperature, vibration  COORDINATION:  finger-nose-finger, fine finger movements normal  REFLEXES:  deep tendon reflexes TRACE and symmetric  GAIT/STATION:  WIDE BASED GAIT; UNSTEADY; CAN STAND ON TOES AND HEEL; DIFF STANDING WITH FEET TOGETHER AND EYES OPEN     DIAGNOSTIC DATA (LABS, IMAGING, TESTING) - I reviewed patient records, labs, notes, testing and imaging myself where available.  Lab Results  Component Value Date   WBC 10.5 05/26/2022   HGB 13.0 05/26/2022   HCT 37.9 05/26/2022   MCV 93.1 05/26/2022   PLT 281 05/26/2022      Component Value Date/Time   NA 140 05/27/2022 0410   NA 143 06/06/2019 1512   K 4.0 05/27/2022 0410   CL 109 05/27/2022 0410   CO2 25 05/27/2022 0410   GLUCOSE 136 (H) 05/27/2022 0410   BUN 29 (H) 05/27/2022 0410   BUN 16 06/06/2019 1512   CREATININE 1.49 (H) 05/27/2022 0410   CREATININE 0.81 01/15/2019 1336   CALCIUM 9.3 05/27/2022 0410   PROT 6.7 01/15/2019 1336   ALBUMIN 4.3 05/27/2018 1547   AST 26 01/15/2019 1336   ALT 41  (H) 01/15/2019 1336   ALKPHOS 77 05/27/2018 1547   BILITOT 0.4 01/15/2019 1336   GFRNONAA 38 (L) 05/27/2022 0410   GFRNONAA 77 01/15/2019 1336   GFRAA 67 06/06/2019 1512   GFRAA 89 01/15/2019 1336   Lab Results  Component Value Date   CHOL 192 03/27/2018   HDL 41 03/27/2018   LDLCALC 114 (H) 03/27/2018  TRIG 183 (H) 03/27/2018   CHOLHDL 4.7 (H) 03/27/2018   Lab Results  Component Value Date   HGBA1C 6.7 (H) 05/27/2022   Lab Results  Component Value Date   VITAMINB12 205 02/12/2019   Lab Results  Component Value Date   TSH 1.292 05/26/2022   04/20/22  Comment SPE  NO M SPIKE SEEN.     05/26/16 MRI cervical spine - Mild cervical spine degenerative change. No acute disc protrusion or neural impingement. Negative for metastatic disease in the cervical spine.  03/01/22 MRI brain 1. Unchanged small meningiomas of the left middle cranial fossa and anterior left convexity. 2. Unchanged mild volume loss and white matter changes typical chronic small vessel ischemia.  03/14/22 EMG/NCS (Dr. Melrose Nakayama) - severe axonal sensory-motor polyneuropathy   ASSESSMENT AND PLAN  66 y.o. year old female here with:   Dx:  1. Neuropathy   2. Cervical myelopathy (HCC)      PLAN:  NEUROPATHY - possible diabetic neuropathy (chronic) with worsening after covid and weight loss  - check neuropathy labs  NECK PAIN, BALANCE DIFF (neck pain; gait diff; numbness in arms and legs) - check MRI cervical spine (rule out myelopathy) - use cane / walker  POSSIBLE TEMPORAL ARTERITIS (biopsy negative; ESR was slightly elevated; L  > R) - better on prednisone; per rheumatology  Orders Placed This Encounter  Procedures   MR CERVICAL SPINE W WO CONTRAST   ANA w/Reflex   SSA, SSB   Vitamin B1   Vitamin B6   Copper   ANCA Profile   Anti-HU, Anti-RI, Anti-YO IFA   GM1 IgG Autoantibodies   MAG IgM Antibodies   Vitamin E   CYCLIC CITRUL PEPTIDE ANTIBODY, IGG/IGA   Return for pending  if symptoms worsen or fail to improve, pending test results.  I spent 60 minutes of face-to-face and non-face-to-face time with patient.  This included previsit chart review, lab review, study review, order entry, electronic health record documentation, patient education.    Penni Bombard, MD 65/99/3570, 17:79 AM Certified in Neurology, Neurophysiology and Neuroimaging  [facility] [address], [place], [place] [address] [phone]

## 2022-06-12 NOTE — Patient Instructions (Addendum)
  NEUROPATHY - possible diabetic neuropathy (chronic) with worsening after covid and weight loss  - check neuropathy labs  NECK PAIN, BALANCE DIFF (neck pain; gait diff; numbness in arms and legs) - check MRI cervical spine (rule out myelopathy)  POSSIBLE TEMPORAL ARTERITIS (biopsy negative; ESR was slightly elevated; L  > R) - better on prednisone; per rheumatology

## 2022-06-13 ENCOUNTER — Telehealth: Payer: Self-pay | Admitting: Diagnostic Neuroimaging

## 2022-06-13 NOTE — Telephone Encounter (Signed)
medicare NPR BCB Josem Kaufmann: [id number] exp. 06/13/22-07/12/22 sent to GI [phone]

## 2022-06-21 ENCOUNTER — Encounter: Payer: Self-pay | Admitting: Cardiology

## 2022-06-21 ENCOUNTER — Ambulatory Visit: Payer: BC Managed Care – PPO | Attending: Cardiology | Admitting: Cardiology

## 2022-06-21 VITALS — BP 142/82 | HR 88 | Ht 67.0 in | Wt 188.0 lb

## 2022-06-21 DIAGNOSIS — I428 Other cardiomyopathies: Secondary | ICD-10-CM | POA: Diagnosis not present

## 2022-06-21 DIAGNOSIS — I5042 Chronic combined systolic (congestive) and diastolic (congestive) heart failure: Secondary | ICD-10-CM

## 2022-06-21 DIAGNOSIS — R790 Abnormal level of blood mineral: Secondary | ICD-10-CM

## 2022-06-21 DIAGNOSIS — I10 Essential (primary) hypertension: Secondary | ICD-10-CM

## 2022-06-21 DIAGNOSIS — R0609 Other forms of dyspnea: Secondary | ICD-10-CM | POA: Diagnosis not present

## 2022-06-21 LAB — MAG IGM ANTIBODIES: MAG IgM Antibodies: <900 BTU (ref 0–999)

## 2022-06-21 LAB — ANA W/REFLEX: ANA Titer 1: NEGATIVE

## 2022-06-21 LAB — ANCA PROFILE
Anti-MPO Antibodies: <0.2 units (ref 0.0–0.9)
Anti-PR3 Antibodies: <0.2 units (ref 0.0–0.9)
Atypical pANCA: <1:20 {titer}
C-ANCA: <1:20 {titer}
P-ANCA: <1:20 {titer}

## 2022-06-21 LAB — VITAMIN B6: Vitamin B6: 49.7 ug/L (ref 3.4–65.2)

## 2022-06-21 LAB — VITAMIN B1: Thiamine: 196.5 nmol/L (ref 66.5–200.0)

## 2022-06-21 LAB — MAG INTERPRETATION REFLEXED

## 2022-06-21 LAB — VITAMIN E
Vitamin E (Alpha Tocopherol): 13.5 mg/L (ref 9.0–29.0)
Vitamin E(Gamma Tocopherol): 1.3 mg/L (ref 0.5–4.9)

## 2022-06-21 LAB — CYCLIC CITRUL PEPTIDE ANTIBODY, IGG/IGA: Cyclic Citrullin Peptide Ab: 13 units (ref 0–19)

## 2022-06-21 LAB — ANTI-HU, ANTI-RI, ANTI-YO IFA
Anti-Hu Ab: NEGATIVE
Anti-Ri Ab: NEGATIVE
Anti-Yo Ab: NEGATIVE

## 2022-06-21 LAB — SJOGREN'S SYNDROME ANTIBODS(SSA + SSB)
ENA SSA (RO) Ab: <0.2 AI (ref 0.0–0.9)
ENA SSB (LA) Ab: <0.2 AI (ref 0.0–0.9)

## 2022-06-21 LAB — COPPER, SERUM: Copper: 112 ug/dL (ref 80–158)

## 2022-06-21 MED ORDER — MAGNESIUM OXIDE 400 MG PO TABS: 400.0000 mg | ORAL_TABLET | Freq: Two times a day (BID) | ORAL | 3 refills | Status: DC

## 2022-06-21 MED ORDER — METOPROLOL SUCCINATE ER 50 MG PO TB24: 50.0000 mg | ORAL_TABLET | Freq: Every day | ORAL | 3 refills | Status: DC

## 2022-06-21 NOTE — Patient Instructions (Addendum)
Medication Instructions:  Your physician has recommended you make the following change in your medication:   INCREASE: Magnesium to 436m twice daily   START: Metoprolol Succinate 563mdaily   Lab Work: 2nd flBest boyour physician recommends that you return for lab work in: approx 1 week You need to have labs done when you are fasting.  You can come Monday through Friday 8:00 am to 11:30 am and 1:00 to 4:30. Closed Friday afternoons.   You do not need to make an appointment as the order has already been placed. The labs you are going to have done are Magnesium   Testing/Procedures: Your physician has requested that you have an echocardiogram. Echocardiography is a painless test that uses sound waves to create images of your heart. It provides your doctor with information about the size and shape of your heart and how well your heart's chambers and valves are working. This procedure takes approximately one hour. There are no restrictions for this procedure. Please do NOT wear cologne, perfume, aftershave, or lotions (deodorant is allowed). Please arrive 15 minutes prior to your appointment time.   You are scheduled for Cardiac MRI . [facility]  (30-45 minutes prior to test start time). ?  [facility] [phone] to the [facility] (First Floor).  ?  Magnetic resonance imaging (MRI) is a painless test that produces images of the inside of the body without using X-rays. During an MRI, strong magnets and radio waves work together in a maResearch officer, political partyo form detailed images. MRI images may provide more details about a medical condition than X-rays, CT scans, and ultrasounds can provide.  You may be given earphones to listen for instructions.  You may eat a light breakfast and take medications as ordered with the exception of Lasix (fluid  pill, other). If a contrast material will be used, an IV will be inserted into one of your veins. Contrast material will be injected into your IV.  You will be asked to remove all metal, including: Watch, jewelry, and other metal objects including hearing aids, hair pieces and dentures. (Braces and fillings normally are not a problem.)  If contrast material was used:  It will leave your body through your urine within a day. You may be told to drink plenty of fluids to help flush the contrast material out of your system.  TEST WILL TAKE APPROXIMATELY 1 HOUR  PLEASE NOTIFY SCHEDULING AT LEAST 24 HOURS IN ADVANCE IF YOU ARE UNABLE TO KEEP YOUR APPOINTMENT.       Follow-Up: [facility] your [facility] needs are our priority.  As part of our continuing mission to provide you with exceptional heart care, we have created designated Provider Care Teams.  These Care Teams include your primary Cardiologist (physician) and Advanced Practice Providers (APPs -  Physician Assistants and Nurse Practitioners) who all work together to provide you with the care you need, when you need it.  We recommend signing up for the patient portal called "MyChart".  Sign up information is provided on this After Visit Summary.  MyChart is used to connect with patients for Virtual Visits (Telemedicine).  Patients are able to view lab/test results, encounter notes, upcoming appointments, etc.  Non-urgent messages can be sent to your provider as well.   To learn more about what you can  do with MyChart, go to [email or web address].    Your next appointment:   1 month(s)  The format for your next appointment:   In Person  Provider:   Jenne Campus, MD    Other Instructions NA

## 2022-06-21 NOTE — Progress Notes (Signed)
Cardiology Office Note:    Date:  06/21/2022   ID:  Meagan Allen, DOB Oct 02, 1955, MRN [id number]  PCP:  Leonel Ramsay, MD  Cardiologist:  Jenne Campus, MD    Referring MD: Leonel Ramsay, MD   Chief Complaint  Patient presents with   Hospitalization Follow-up    HR dropped down to 30 during a biopsy. Patient was seen at [facility]    History of Present Illness:    Meagan Allen is a 66 y.o. female with past medical history significant for nonischemic cardiomyopathy, HTN initial ejection fraction 30 to 35% but improved with guideline directed medical therapy, cardiac catheterization done a few years ago showed no significant obstructive disease.  Additional problem include essential hypertension, diabetes.  Recently she started experiencing some headache.  She went to her rheumatologist suspicion for left temporal arteritis has been raised.  Then she ended up having biopsy of this lesion after she was given high-dose of steroids.  While she was having surgery there was significant drop in her heart rate down to 30 and eventually she ended up being transferred to the emergency room in the emergency room her metoprolol has been completely discontinued and she has been doing better in terms of dizziness since that time.  Additional problem include neuropathy in lower extremities which make her unsteady on her feet.  She went to [place] for vacation and up falling down tripping while she was there.  She is overall very disappointed with all the ways that her healthcare issue ongoing.  She check her blood pressure heart rate on the regular basis there is high variation in heart rate sometimes very slow sometimes very fast.  Since that time have been helpful has been withdrawn she took 1 time 100 mg which improve her fast heartbeats.  Another time she tried 200 would make it feel very bad.  Past Medical History:  Diagnosis Date   Anemia    no recent iron transfusion per pt on  05-16-2021   Anxiety    Arthritis    osteo, inflammatory arthritis and rheumatoid arthritis   Cancer (HCC)    thyroid cancer   CHF (congestive heart failure) ([place])    diagnosed feb 2019   Deafness in right ear    Depression    Dysphagia 07/19/2020   occ   Dyspnea    on exertion on rare occasions   Enlarged liver    Expressive aphasia 03/15/2020   resolved per pt on 05-16-2021   Fibromyalgia    Gastroesophageal reflux disease with esophagitis without hemorrhage 07/19/2020   GERD (gastroesophageal reflux disease)    Headache disorder 03/15/2020   Heart murmur    High uric acid in 24 hour urine specimen 09/24/2019   History of colon polyps    History of corticosteroid therapy 07/13/2020   History of COVID-19 03/2021   mild symptoms x 5 days all symptoms resolved   History of thyroid cancer 2007;  2009   dx papillary thyroid cancer w/ mets to cervical lymph nodes   HOH (hard of hearing)    left ear   HTN (hypertension) 03/25/2012   Hypercalcemia 07/13/2020   Hyperlipidemia    Hypertension    Hypothyroidism 07/13/2020   IBS (irritable bowel syndrome)    constipation issues   Iron deficiency anemia 07/13/2020   Lesion of liver 07/19/2020   Meningioma ([place]) 03/29/2021   8 mm left frontal lobe unchanged suspected 3 mm left ant temporal lobe memingioma unchanged  in size per 03-29-2021 brain mri   Migraine    Mood changes    Morbid obesity ([place]) 07/13/2020   Nonischemic cardiomyopathy [facility]) cardiologist-  dr Edyth Gunnels   per cardiac cath 08-28-2017  moderate LV dysfunction w/ diffuse hypocontractility ,  ef 35-40%   Optic nerve and pathway injury, left, initial encounter    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, per opthamologist black spot middle of left eye   PONV (postoperative nausea and vomiting)    last colonoscopy propfol bp dropped and vomiting   Post-surgical hypothyroidism    Primary localized osteoarthrosis of ankle and foot 02/13/2020   and hands   Rectal  bleeding 05/02/2021   Rosacea    Thyroid cancer ([place]) 06/25/2006   surgery and radioactive iodine done   Type 2 diabetes mellitus ([place])    followed by pcp   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[place])    Wears glasses    Wears hearing aid in both ears    left ear hearing aid, microphone in right ear    Past Surgical History:  Procedure Laterality Date   CARDIOVASCULAR STRESS TEST  08-20-2017   dr Agustin Cree   High risk nuclear study w/ large irreverisible defect in the basal and mid inferoseptal, inferior, inferolateral walls and apical septal, inferior walls with small peri infarct ischemia in the apical anterior & lateral walls (consistant w/ prior MI )/  no ST segment deviation noted /  nuclear stress ef 36%/  recommended cardiac cath   CARPOMETACARPAL [facility]) FUSION OF THUMB Bilateral 2003   COLONOSCOPY WITH PROPOFOL N/A 05/06/2013   Procedure: COLONOSCOPY WITH PROPOFOL;  Surgeon: Garlan Fair, MD;  Location: [facility];  Service: Endoscopy;  Laterality: N/A;   D & C HYSTEROSCOPY /  RESECTION POLYP/ ROLLER BALL ABLATION  02-26-2004   dr Kathyrn Drown  [facility]   DILATION AND CURETTAGE OF UTERUS  1984   EXCISION MORTON'S NEUROMA Left 1996   giant cell tumor  2011   Left ankle 2011   JOINT REPLACEMENT Left 05/2020   2 surgical center of [place]   KNEE ARTHROSCOPY  08-16-2010  dr Beola Cord  [facility];  05/ 2012   right x2 left x1   LEFT HEART CATH AND CORONARY ANGIOGRAPHY N/A 08/28/2017   Procedure: LEFT HEART CATH AND CORONARY ANGIOGRAPHY;  Surgeon: Troy Sine, MD;  Location: [facility];  Service: Cardiovascular;  Laterality: N/A;   large normal coronary arteries in a dominant RCA system;  moderate LV systoic dysfunction w/ diffuse hypocontractility (compatible w/ nonischemic cardiomyopathy), LV end diastoilc pressure normal, LVEF 35-45% by visual estimate   Left knee replaced   05/2010   NASAL LACRIMAL DUCT SURGERY  06/14/2009   REDO RIGHT MODIFIED RADICAL NECK DISSECTION  08-29-2007     [facility]   SHOULDER ARTHROSCOPY WITH ROTATOR CUFF REPAIR AND SUBACROMIAL DECOMPRESSION Left 10/17/2017   Procedure: Left shoulder mini open rotator cuff repair, subacromial decompression;  Surgeon: [name], MD;  Location: [facility];  Service: Orthopedics;  Laterality: Left;  Interscalene Block   TOTAL KNEE ARTHROPLASTY Right 06/03/2018   Procedure: RIGHT TOTAL KNEE ARTHROPLASTY;  Surgeon: Vickey Huger, MD;  Location: [facility];  Service: Orthopedics;  Laterality: Right;   TOTAL THYROIDECTOMY  2007  -- [facility]   w/ dissection lymph nodes   TRANSANAL HEMORRHOIDAL DEARTERIALIZATION N/A 05/20/2021   Procedure: TRANSANAL HEMORRHOIDAL DEARTERIALIZATION;  Surgeon: Leighton Ruff, MD;  Location: [facility];  Service: General;  Laterality: N/A;   TRANSTHORACIC ECHOCARDIOGRAM  08-20-2017  dr Agustin Cree   ef 25-30%, severe diffuse hypokinesis with no identifiable regional variations, but with profound dyssynchrony, due to arrhythmia insuffient to evaluate LV diastolic dysfunction/  trivial MR/  mild LAE/ Ventricular septum motion abnormal funtion,dyssynergy, & paradox   WRIST SURGERY Right 2001    Current Medications: Current Meds  Medication Sig   allopurinol (ZYLOPRIM) 300 MG tablet Take 300 mg by mouth daily.   aspirin EC 81 MG tablet Take 81 mg by mouth daily.   aspirin-acetaminophen-caffeine (EXCEDRIN MIGRAINE) 250-250-65 MG tablet Take 1 tablet by mouth every 6 (six) hours as needed for headache.   buPROPion (WELLBUTRIN XL) 150 MG 24 hr tablet Take 450 mg by mouth every morning.    cetirizine (ZYRTEC) 10 MG tablet Take 10 mg by mouth daily.   Cholecalciferol (VITAMIN D) 50 MCG (2000 UT) CAPS Take 1 capsule by mouth daily.   cinacalcet (SENSIPAR) 30 MG tablet Take 30 mg by mouth 3 (three) times a week. Mon, Wed, Fri   clindamycin (CLEOCIN T) 1 % external solution Apply 1 application  topically 2 (two) times daily as needed.   clonazePAM (KLONOPIN) 0.5 MG tablet Take 0.25 mg by mouth at  bedtime.   doxycycline (VIBRAMYCIN) 100 MG capsule Take 100 mg by mouth as directed.   furosemide (LASIX) 20 MG tablet Take 1 tablet (20 mg total) by mouth daily. (Patient taking differently: Take 20 mg by mouth 3 (three) times a week. Tues, Thurs, Sun)   HUMIRA PEN 40 MG/0.4ML PNKT Inject 40 mg into the skin every 14 (fourteen) days.   magnesium oxide (MAG-OX) 400 MG tablet Take 1 tablet (400 mg total) by mouth daily.   Melatonin 10 MG CAPS Take 10 mg by mouth at bedtime.   mesalamine (LIALDA) 1.2 g EC tablet Take 1.2 g by mouth at bedtime.   metFORMIN (GLUCOPHAGE-XR) 500 MG 24 hr tablet Take 500-1,000 mg by mouth See admin instructions. Take 2 tablets (1,000 mg) in the morning and 1 tablet (500 mg) at bedtime   Misc Natural Products (COSAMIN ASU ADVANCED FORMULA) CAPS Take 2 tablets by mouth daily as needed (gout flare). Unknown strength   mometasone (ELOCON) 0.1 % lotion Apply 1 application  topically 2 (two) times daily as needed (dry skin).   nystatin (MYCOSTATIN/NYSTOP) powder Apply 1 application topically as needed (yeast).   pantoprazole (PROTONIX) 40 MG tablet Take 40 mg by mouth daily.   polyethylene glycol powder (GLYCOLAX/MIRALAX) powder Take 17 g by mouth daily as needed for mild constipation or moderate constipation (for constipation.).   predniSONE (DELTASONE) 20 MG tablet Take 30 mg by mouth daily with breakfast.   rosuvastatin (CRESTOR) 5 MG tablet Take 5 mg by mouth 3 (three) times a week. Mon, Wed, Fri   sacubitril-valsartan (ENTRESTO) 49-51 MG Take 1 tablet by mouth 2 (two) times daily.   SYNTHROID 112 MCG tablet Take 100 mcg by mouth at bedtime.   TRULANCE 3 MG TABS Take 1 tablet by mouth daily as needed (IBS).   [DISCONTINUED] Calcium-Magnesium-Vitamin D (CALCIUM 1200+D3 PO) Take 1 tablet by mouth 2 (two) times daily.     Allergies:   Hydroxychloroquine, Sulfa antibiotics, Ace inhibitors, Canagliflozin, Empagliflozin, Exenatide, Gabapentin, Lisinopril, Methotrexate,  Pravastatin, and Propofol   Social History   Socioeconomic History   Marital status: Married    Spouse name: Jeneen Rinks   Number of children: 2   Years of education: 12   Highest education level: Not on file  Occupational History   Occupation: Banker for [employer]    Employer: [employer]  Tobacco Use   Smoking status: Former    Packs/day: 3.00    Years: 10.00    Total pack years: 30.00    Types: Cigarettes    Quit date: 07/17/1980    Years since quitting: 41.9    Passive exposure: Past   Smokeless tobacco: Never  Vaping Use   Vaping Use: Never used  Substance and Sexual Activity   Alcohol use: Yes    Comment: very occ   Drug use: No   Sexual activity: Yes    Birth control/protection: Post-menopausal  Other Topics Concern   Not on file  Social History Narrative   Atira was born in [place], [place]. She moved to [place] in 1978 when her family moved to this state. Alyssandra currently lives in [place] with her husband of 87 years. They have 2 adult children and 1 grandson. 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   Social Determinants of Health   Financial Resource Strain: Not on file  Food Insecurity: No Food Insecurity (05/26/2022)   Hunger Vital Sign    Worried About Running Out of Food in the Last Year: Never true    Ran Out of Food in the Last Year: Never true  Transportation Needs: No Transportation Needs (05/26/2022)   PRAPARE - Hydrologist (Medical): No    Lack of Transportation (Non-Medical): No  Physical Activity: Not on file  Stress: Not on file  Social Connections: Not on file     Family History: The patient's family history includes Arthritis in her mother; Asthma in her son; Cancer in her brother; Depression in her mother; Diabetes in her father; Gout in her brother, brother, and brother; Heart disease in her father and sister; Hypertension in her father; Stroke in her  mother. There is no history of Breast cancer. ROS:   Please see the history of present illness.    All 14 point review of systems negative except as described per history of present illness  EKGs/Labs/Other Studies Reviewed:      Recent Labs: 05/26/2022: Hemoglobin 13.0; Platelets 281; TSH 1.292 05/27/2022: B Natriuretic Peptide 142.3; BUN 29; Creatinine, Ser 1.49; Magnesium 1.6; Potassium 4.0; Sodium 140  Recent Lipid Panel    Component Value Date/Time   CHOL 192 03/27/2018 0937   TRIG 183 (H) 03/27/2018 0937   HDL 41 03/27/2018 0937   CHOLHDL 4.7 (H) 03/27/2018 0937   LDLCALC 114 (H) 03/27/2018 0937    Physical Exam:    VS:  BP (!) 142/82 (BP Location: Left Arm, Patient Position: Sitting)   Pulse 88   Ht 5' 7"  (1.702 m)   Wt 188 lb (85.3 kg)   SpO2 94%   BMI 29.44 kg/m     Wt Readings from Last 3 Encounters:  06/21/22 188 lb (85.3 kg)  06/12/22 185 lb (83.9 kg)  06/01/22 186 lb 9.6 oz (84.6 kg)     GEN:  Well nourished, well developed in no acute distress HEENT: Normal NECK: No JVD; No carotid bruits LYMPHATICS: No lymphadenopathy CARDIAC: RRR, no murmurs, no rubs, no gallops RESPIRATORY:  Clear to auscultation without rales, wheezing or rhonchi  ABDOMEN: Soft, non-tender, non-distended MUSCULOSKELETAL:  No edema; No deformity  SKIN: Warm and dry LOWER EXTREMITIES: no swelling NEUROLOGIC:  Alert and oriented x 3 PSYCHIATRIC:  Normal affect   ASSESSMENT:    1. Chronic combined systolic and diastolic congestive heart  failure ([place])   2. Primary hypertension   3. Nonischemic cardiomyopathy (HCC)    PLAN:    In order of problems listed above:  History of congestive heart failure.  I will ask her to have echocardiogram done to recheck left ventricle ejection fraction in terms of looking for additional etiology of her cardiomyopathy I will schedule her to have heart MRI will look for both sarcoidosis as well as amyloidosis.  This is also suggestion made by  rheumatologist.  In the meantime she is off metoprolol but I will put her back on 50 mg daily metoprolol succinate, she did wear a monitor waiting for results of the test.  I suspect the reason for bradycardia was sinus bradycardia secondary to metoprolol as well as frequent ventricular ectopy as they can see on the EKG.  That probably explain her high variation of heart rate as well as blood pressure. Essential hypertension blood pressure seems to be slightly lower higher side, additional metoprolol should help. PVCs.  I will ask her to have magnesium rechecked she said that her magnesium was low she was given magnesium but still on the lower side so she will double the dose and we will check a magnesium next few days. Questionable prior arteritis, status post biopsy, she did see eye Dr. Reece Levy.  It looks like diagnosis has not been established yet.  She was given high-dose of steroids being tapered down.  She is being followed by rheumatologist for it.   Medication Adjustments/Labs and Tests Ordered: Current medicines are reviewed at length with the patient today.  Concerns regarding medicines are outlined above.  No orders of the defined types were placed in this encounter.  Medication changes: No orders of the defined types were placed in this encounter.   Signed, Park Liter, MD, [facility] 06/21/2022 10:30 AM    [place]

## 2022-06-22 LAB — HEMOGLOBIN AND HEMATOCRIT, BLOOD
Hematocrit: 34.6 % (ref 34.0–46.6)
Hemoglobin: 12.1 g/dL (ref 11.1–15.9)

## 2022-06-23 ENCOUNTER — Encounter: Payer: Self-pay | Admitting: Diagnostic Neuroimaging

## 2022-06-26 ENCOUNTER — Ambulatory Visit
Admission: RE | Admit: 2022-06-26 | Discharge: 2022-06-26 | Disposition: A | Discharge status: Home / Self Care | Payer: BC Managed Care – PPO | Source: Ambulatory Visit | Attending: Diagnostic Neuroimaging | Admitting: Diagnostic Neuroimaging

## 2022-06-26 DIAGNOSIS — G959 Disease of spinal cord, unspecified: Secondary | ICD-10-CM | POA: Diagnosis not present

## 2022-06-26 MED ORDER — GADOPICLENOL 0.5 MMOL/ML IV SOLN
8.0000 mL | Freq: Once | INTRAVENOUS | Status: AC | PRN
  Administered 2022-06-26: 8 mL via INTRAVENOUS

## 2022-06-29 ENCOUNTER — Telehealth: Payer: Self-pay

## 2022-06-29 NOTE — Telephone Encounter (Signed)
Patient notified through my chart.

## 2022-06-29 NOTE — Telephone Encounter (Signed)
-----   Message from Park Liter, MD sent at 06/28/2022 12:45 PM EST ----- Blood count looks good

## 2022-06-30 LAB — MAGNESIUM: Magnesium: 1.8 mg/dL (ref 1.6–2.3)

## 2022-07-03 ENCOUNTER — Telehealth (HOSPITAL_COMMUNITY): Payer: Self-pay | Admitting: Emergency Medicine

## 2022-07-03 NOTE — Telephone Encounter (Signed)
Attempted to call patient regarding upcoming cardiac MR appointment. Left message on voicemail with name and callback number Corrine Tillis RN Navigator Cardiac Imaging [facility] [phone] Office [phone] Cell  

## 2022-07-03 NOTE — Telephone Encounter (Signed)
Reaching out to patient to offer assistance regarding upcoming cardiac imaging study; pt verbalizes understanding of appt date/time, parking situation and where to check in, pre-test NPO status and medications ordered, and verified current allergies; name and call back number provided for further questions should they arise Meagan Bond RN Navigator Cardiac Imaging [facility] [phone] office [phone] cell  Arrival 1030 Denies iv issues Denies metal implants other than surgical clips thyroidectomy Denies claustro Holding lasix

## 2022-07-04 ENCOUNTER — Ambulatory Visit: Payer: BC Managed Care – PPO | Attending: Cardiology

## 2022-07-04 DIAGNOSIS — R0609 Other forms of dyspnea: Secondary | ICD-10-CM

## 2022-07-04 LAB — ECHOCARDIOGRAM COMPLETE
Area-P 1/2: 3.06 cm2
S' Lateral: 3.6 cm

## 2022-07-05 ENCOUNTER — Other Ambulatory Visit: Payer: Self-pay | Admitting: Cardiology

## 2022-07-05 ENCOUNTER — Ambulatory Visit
Admission: RE | Admit: 2022-07-05 | Discharge: 2022-07-05 | Disposition: A | Discharge status: Home / Self Care | Payer: BC Managed Care – PPO | Source: Ambulatory Visit | Attending: Cardiology | Admitting: Cardiology

## 2022-07-05 DIAGNOSIS — I428 Other cardiomyopathies: Secondary | ICD-10-CM | POA: Diagnosis not present

## 2022-07-05 MED ORDER — GADOBUTROL 1 MMOL/ML IV SOLN
12.0000 mL | Freq: Once | INTRAVENOUS | Status: AC | PRN
  Administered 2022-07-05: 12 mL via INTRAVENOUS

## 2022-07-06 ENCOUNTER — Telehealth: Payer: Self-pay

## 2022-07-06 NOTE — Telephone Encounter (Signed)
Results reviewed with pt as per Dr. Wendy Poet note.  Pt verbalized understanding and had no additional questions. Routed to PCP

## 2022-07-12 ENCOUNTER — Encounter: Payer: Self-pay | Admitting: Cardiology

## 2022-07-14 ENCOUNTER — Telehealth: Payer: Self-pay

## 2022-07-14 DIAGNOSIS — I472 Ventricular tachycardia, unspecified: Secondary | ICD-10-CM

## 2022-07-14 DIAGNOSIS — I493 Ventricular premature depolarization: Secondary | ICD-10-CM

## 2022-07-14 NOTE — Telephone Encounter (Signed)
Referral made to Dr. Curt Bears per Dr. Wendy Poet note. Spoke with pt. She agreed and verbalized understanding. She had no further questions.

## 2022-07-26 ENCOUNTER — Other Ambulatory Visit: Payer: Self-pay

## 2022-08-01 ENCOUNTER — Encounter: Payer: Self-pay | Admitting: Cardiology

## 2022-08-01 ENCOUNTER — Ambulatory Visit: Payer: BC Managed Care – PPO | Attending: Cardiology | Admitting: Cardiology

## 2022-08-01 VITALS — BP 120/72 | HR 60 | Ht 67.0 in | Wt 196.1 lb

## 2022-08-01 DIAGNOSIS — I10 Essential (primary) hypertension: Secondary | ICD-10-CM | POA: Diagnosis not present

## 2022-08-01 DIAGNOSIS — N1831 Chronic kidney disease, stage 3a: Secondary | ICD-10-CM

## 2022-08-01 DIAGNOSIS — I428 Other cardiomyopathies: Secondary | ICD-10-CM | POA: Diagnosis not present

## 2022-08-01 DIAGNOSIS — I4729 Other ventricular tachycardia: Secondary | ICD-10-CM | POA: Diagnosis not present

## 2022-08-01 DIAGNOSIS — R001 Bradycardia, unspecified: Secondary | ICD-10-CM

## 2022-08-01 NOTE — Patient Instructions (Signed)
Medication Instructions:  Your physician recommends that you continue on your current medications as directed. Please refer to the Current Medication list given to you today.  *If you need a refill on your cardiac medications before your next appointment, please call your pharmacy*   Lab Work: 3rd Floor [address] Magnesium - today If you have labs (blood work) drawn today and your tests are completely normal, you will receive your results only by: [place] (if you have MyChart) OR A paper copy in the mail If you have any lab test that is abnormal or we need to change your treatment, we will call you to review the results.   Testing/Procedures: None Ordered   Follow-Up: At [facility], you and your health needs are our priority.  As part of our continuing mission to provide you with exceptional heart care, we have created designated Provider Care Teams.  These Care Teams include your primary Cardiologist (physician) and Advanced Practice Providers (APPs -  Physician Assistants and Nurse Practitioners) who all work together to provide you with the care you need, when you need it.  We recommend signing up for the patient portal called "MyChart".  Sign up information is provided on this After Visit Summary.  MyChart is used to connect with patients for Virtual Visits (Telemedicine).  Patients are able to view lab/test results, encounter notes, upcoming appointments, etc.  Non-urgent messages can be sent to your provider as well.   To learn more about what you can do with MyChart, go to [email or web address].    Your next appointment:   3 month(s)  The format for your next appointment:   In Person  Provider:   Jenne Campus, MD    Other Instructions NA

## 2022-08-01 NOTE — Progress Notes (Signed)
Cardiology Office Note:    Date:  08/01/2022   ID:  Meagan Allen, DOB September 12, 1955, MRN [id number]  PCP:  Leonel Ramsay, MD  Cardiologist:  Jenne Campus, MD    Referring MD: Leonel Ramsay, MD   Chief Complaint  Patient presents with   Follow-up    History of Present Illness:    Meagan Allen is a 67 y.o. female  with past medical history significant for nonischemic cardiomyopathy, HTN initial ejection fraction 30 to 35% but improved with guideline directed medical therapy, cardiac catheterization done a few years ago showed no significant obstructive disease. Additional problem include essential hypertension, diabetes. Recently she started experiencing some headache. She went to her rheumatologist suspicion for left temporal arteritis has been raised. Then she ended up having biopsy of this lesion after she was given high-dose of steroids. While she was having surgery there was significant drop in her heart rate down to 30 and eventually she ended up being transferred to the emergency room in the emergency room her metoprolol has been completely discontinued and she has been doing better in terms of dizziness since that time. Additional problem include neuropathy in lower extremities which make her unsteady on her feet  Since have seen her last time she seems to be doing quite well.  As suggested by rheumatology she did have an MRI of her heart done we will look at ruling out sarcoidosis as well as amyloidosis, MRI of the heart came fine with ejection fraction 51%.  She also wore monitor, however monitor show high PVC burden which was 10% as well as 3 runs of nonsustained ventricular tachycardia.  She denies having any dizziness no palpitations.  She is scheduled to see our electrophysiologist colleague.  Overall she seems to be doing fine.  She is frustrated with all her medical problems she end up having shingles on the way however very mild course.  Denies have any chest pain  tightness squeezing pressure burning chest  Past Medical History:  Diagnosis Date   Anemia    no recent iron transfusion per pt on 05-16-2021   Anxiety    Arthritis    osteo, inflammatory arthritis and rheumatoid arthritis   Cancer ([place])    thyroid cancer   CHF (congestive heart failure) ([place])    diagnosed feb 2019   Deafness in right ear    Depression    Dysphagia 07/19/2020   occ   Dyspnea    on exertion on rare occasions   Enlarged liver    Expressive aphasia 03/15/2020   resolved per pt on 05-16-2021   Fibromyalgia    Gastroesophageal reflux disease with esophagitis without hemorrhage 07/19/2020   GERD (gastroesophageal reflux disease)    Headache disorder 03/15/2020   Heart murmur    High uric acid in 24 hour urine specimen 09/24/2019   History of colon polyps    History of corticosteroid therapy 07/13/2020   History of COVID-19 03/2021   mild symptoms x 5 days all symptoms resolved   History of thyroid cancer 2007;  2009   dx papillary thyroid cancer w/ mets to cervical lymph nodes   HOH (hard of hearing)    left ear   HTN (hypertension) 03/25/2012   Hypercalcemia 07/13/2020   Hyperlipidemia    Hypertension    Hypothyroidism 07/13/2020   IBS (irritable bowel syndrome)    constipation issues   Iron deficiency anemia 07/13/2020   Lesion of liver 07/19/2020   Meningioma ([place]) 03/29/2021  8 mm left frontal lobe unchanged suspected 3 mm left ant temporal lobe memingioma unchanged in size per 03-29-2021 brain mri   Migraine    Mood changes    Morbid obesity ([place]) 07/13/2020   Nonischemic cardiomyopathy 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, per opthamologist black spot middle of left eye   PONV (postoperative nausea and vomiting)    last colonoscopy propfol bp dropped  and vomiting   Post-surgical hypothyroidism    Primary localized osteoarthrosis of ankle and foot 02/13/2020   and hands   Rectal bleeding 05/02/2021   Rosacea    Thyroid cancer ([place]) 06/25/2006   surgery and radioactive iodine done   Type 2 diabetes mellitus ([place])    followed by pcp   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[place])    Wears glasses    Wears hearing aid in both ears    left ear hearing aid, microphone in right ear    Past Surgical History:  Procedure Laterality Date   CARDIOVASCULAR STRESS TEST  08-20-2017   dr Agustin Cree   High risk nuclear study w/ large irreverisible defect in the basal and mid inferoseptal, inferior, inferolateral walls and apical septal, inferior walls with small peri infarct ischemia in the apical anterior & lateral walls (consistant w/ prior MI )/  no ST segment deviation noted /  nuclear stress ef 36%/  recommended cardiac cath   CARPOMETACARPAL [facility]) FUSION OF THUMB Bilateral 2003   COLONOSCOPY WITH PROPOFOL N/A 05/06/2013   Procedure: COLONOSCOPY WITH PROPOFOL;  Surgeon: Garlan Fair, MD;  Location: [facility];  Service: Endoscopy;  Laterality: N/A;   D & C HYSTEROSCOPY /  RESECTION POLYP/ ROLLER BALL ABLATION  02-26-2004   dr Kathyrn Drown  [facility]   DILATION AND CURETTAGE OF UTERUS  1984   EXCISION MORTON'S NEUROMA Left 1996   giant cell tumor  2011   Left ankle 2011   JOINT REPLACEMENT Left 05/2020   2 surgical center of [place]   KNEE ARTHROSCOPY  08-16-2010  dr Beola Cord  [facility];  05/ 2012   right x2 left x1   LEFT HEART CATH AND CORONARY ANGIOGRAPHY N/A 08/28/2017   Procedure: LEFT HEART CATH AND CORONARY ANGIOGRAPHY;  Surgeon: Troy Sine, MD;  Location: [facility];  Service: Cardiovascular;  Laterality: N/A;   large normal coronary arteries in a dominant RCA system;  moderate LV systoic dysfunction w/ diffuse hypocontractility (compatible w/ nonischemic cardiomyopathy), LV end diastoilc pressure normal, LVEF 35-45% by visual estimate    Left knee replaced   05/2010   NASAL LACRIMAL DUCT SURGERY  06/14/2009   REDO RIGHT MODIFIED RADICAL NECK DISSECTION  08-29-2007    [facility]   SHOULDER ARTHROSCOPY WITH ROTATOR CUFF REPAIR AND SUBACROMIAL DECOMPRESSION Left 10/17/2017   Procedure: Left shoulder mini open rotator cuff repair, subacromial decompression;  Surgeon: [name], MD;  Location: [facility];  Service: Orthopedics;  Laterality: Left;  Interscalene Block   TOTAL KNEE ARTHROPLASTY Right 06/03/2018   Procedure: RIGHT TOTAL KNEE ARTHROPLASTY;  Surgeon: Vickey Huger, MD;  Location: [facility];  Service: Orthopedics;  Laterality: Right;   TOTAL THYROIDECTOMY  2007  -- [facility]   w/ dissection lymph nodes   TRANSANAL HEMORRHOIDAL DEARTERIALIZATION N/A 05/20/2021   Procedure: TRANSANAL HEMORRHOIDAL DEARTERIALIZATION;  Surgeon: Leighton Ruff, MD;  Location: [facility];  Service: General;  Laterality: N/A;   TRANSTHORACIC ECHOCARDIOGRAM  08-20-2017  dr Agustin Cree   ef 25-30%, severe diffuse hypokinesis with no identifiable regional variations, but with profound dyssynchrony, due to arrhythmia insuffient to evaluate LV diastolic dysfunction/  trivial MR/  mild LAE/ Ventricular septum motion abnormal funtion,dyssynergy, & paradox   WRIST SURGERY Right 2001    Current Medications: Current Meds  Medication Sig   allopurinol (ZYLOPRIM) 300 MG tablet Take 300 mg by mouth daily.   aspirin EC 81 MG tablet Take 81 mg by mouth daily.   aspirin-acetaminophen-caffeine (EXCEDRIN MIGRAINE) 250-250-65 MG tablet Take 1 tablet by mouth every 6 (six) hours as needed for headache.   buPROPion (WELLBUTRIN XL) 150 MG 24 hr tablet Take 450 mg by mouth every morning.    cetirizine (ZYRTEC) 10 MG tablet Take 10 mg by mouth daily.   Cholecalciferol (VITAMIN D) 50 MCG (2000 UT) CAPS Take 1 capsule by mouth daily.   cinacalcet (SENSIPAR) 30 MG tablet Take 30 mg by mouth 3 (three) times a week. On hold Mon, Wed, Fri   clindamycin (CLEOCIN T) 1 %  external solution Apply 1 application  topically 2 (two) times daily as needed (rash).   clonazePAM (KLONOPIN) 0.5 MG tablet Take 0.25 mg by mouth at bedtime.   doxycycline (VIBRAMYCIN) 100 MG capsule Take 100 mg by mouth as directed.   furosemide (LASIX) 20 MG tablet Take 1 tablet (20 mg total) by mouth daily. (Patient taking differently: Take 20 mg by mouth 3 (three) times a week. Tues, Thurs, Sun)   glipiZIDE (GLUCOTROL XL) 10 MG 24 hr tablet Take 1 tablet by mouth daily.   HUMIRA PEN 40 MG/0.4ML PNKT Inject 40 mg into the skin every 14 (fourteen) days.   magnesium oxide (MAG-OX) 400 MG tablet Take 1 tablet (400 mg total) by mouth 2 (two) times daily.   mesalamine (LIALDA) 1.2 g EC tablet Take 1.2 g by mouth at bedtime.   metFORMIN (GLUCOPHAGE-XR) 500 MG 24 hr tablet Take 500-1,000 mg by mouth See admin instructions. Take 2 tablets (1,000 mg) in the morning and 1 tablet (500 mg) at bedtime   metoprolol succinate (TOPROL-XL) 50 MG 24 hr tablet Take 1 tablet (50 mg total) by mouth daily. Take with or immediately following a meal.   Misc Natural Products (COSAMIN ASU ADVANCED FORMULA) CAPS Take 2 tablets by mouth daily as needed (gout flare). Unknown strength   mometasone (ELOCON) 0.1 % lotion Apply 1 application  topically 2 (two) times daily as needed (dry skin).   nystatin (MYCOSTATIN/NYSTOP) powder Apply 1 application topically as needed (yeast).   pantoprazole (PROTONIX) 40 MG tablet Take 40 mg by mouth daily.   polyethylene glycol powder (GLYCOLAX/MIRALAX) powder Take 17 g by mouth daily as needed for mild constipation or moderate constipation (for constipation.).   rosuvastatin (CRESTOR) 5 MG tablet Take 5 mg by mouth 3 (three) times a week. Mon, Wed, Fri   sacubitril-valsartan (ENTRESTO) 49-51 MG Take 1 tablet by mouth 2 (two) times daily.   SYNTHROID 112 MCG tablet Take 100 mcg by mouth at bedtime.   TRULANCE 3 MG TABS Take 1 tablet by mouth daily as needed (IBS).   zoledronic acid  (RECLAST) 5 MG/100ML SOLN injection Inject 5 mg into the vein once. Q year     Allergies:   Hydroxychloroquine, Sulfa antibiotics, Ace inhibitors, Canagliflozin, Empagliflozin, Exenatide, Gabapentin, Lisinopril, Methotrexate, Pravastatin, and Propofol   Social History   Socioeconomic History   Marital status:  Married    Spouse name: Jeneen Rinks   Number of children: 2   Years of education: 12   Highest education level: Not on file  Occupational History   Occupation: Banker for [employer]    Employer: [employer]  Tobacco Use   Smoking status: Former    Packs/day: 3.00    Years: 10.00    Total pack years: 30.00    Types: Cigarettes    Quit date: 07/17/1980    Years since quitting: 42.0    Passive exposure: Past   Smokeless tobacco: Never  Vaping Use   Vaping Use: Never used  Substance and Sexual Activity   Alcohol use: Yes    Comment: very occ   Drug use: No   Sexual activity: Yes    Birth control/protection: Post-menopausal  Other Topics Concern   Not on file  Social History Narrative   Birtie was born in [place], [place]. She moved to [place] in 1978 when her family moved to this state. Blen currently lives in [place] with her husband of 29 years. They have 2 adult children and 1 grandson. 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   Social Determinants of Health   Financial Resource Strain: Not on file  Food Insecurity: No Food Insecurity (05/26/2022)   Hunger Vital Sign    Worried About Running Out of Food in the Last Year: Never true    Ran Out of Food in the Last Year: Never true  Transportation Needs: No Transportation Needs (05/26/2022)   PRAPARE - Hydrologist (Medical): No    Lack of Transportation (Non-Medical): No  Physical Activity: Not on file  Stress: Not on file  Social Connections: Not on file     Family History: The patient's family history includes Arthritis in  her mother; Asthma in her son; Cancer in her brother; Depression in her mother; Diabetes in her father; Gout in her brother, brother, and brother; Heart disease in her father and sister; Hypertension in her father; Stroke in her mother. There is no history of Breast cancer. ROS:   Please see the history of present illness.    All 14 point review of systems negative except as described per history of present illness  EKGs/Labs/Other Studies Reviewed:      Recent Labs: 05/26/2022: Platelets 281; TSH 1.292 05/27/2022: B Natriuretic Peptide 142.3; BUN 29; Creatinine, Ser 1.49; Potassium 4.0; Sodium 140 06/21/2022: Hemoglobin 12.1 06/29/2022: Magnesium 1.8  Recent Lipid Panel    Component Value Date/Time   CHOL 192 03/27/2018 0937   TRIG 183 (H) 03/27/2018 0937   HDL 41 03/27/2018 0937   CHOLHDL 4.7 (H) 03/27/2018 0937   LDLCALC 114 (H) 03/27/2018 0937    Physical Exam:    VS:  BP 120/72 (BP Location: Left Arm, Patient Position: Sitting)   Pulse 60   Ht '5\' 7"'$  (1.702 m)   Wt 196 lb 1.9 oz (89 kg)   SpO2 96%   BMI 30.72 kg/m     Wt Readings from Last 3 Encounters:  08/01/22 196 lb 1.9 oz (89 kg)  06/21/22 188 lb (85.3 kg)  06/12/22 185 lb (83.9 kg)     GEN:  Well nourished, well developed in no acute distress HEENT: Normal NECK: No JVD; No carotid bruits LYMPHATICS: No lymphadenopathy CARDIAC: RRR, no murmurs, no rubs, no gallops RESPIRATORY:  Clear to auscultation without rales, wheezing or rhonchi  ABDOMEN: Soft, non-tender, non-distended MUSCULOSKELETAL:  No edema; No deformity  SKIN: Warm and dry LOWER EXTREMITIES: no swelling NEUROLOGIC:  Alert and oriented x 3 PSYCHIATRIC:  Normal affect   ASSESSMENT:    1. Nonischemic cardiomyopathy ([place])   2. Primary hypertension   3. Nonsustained ventricular tachycardia ([place])   4. Chronic kidney disease, stage 3a ([place])   5. Bradycardia    PLAN:    In order of problems listed above:  History of nonischemic  cardiomyopathy on guideline directed medical therapy that she can tolerate, last echocardiogram done in December showed ejection fraction 50 to 55%, that was confirmed by MRI of the heart with ejection fraction 51%.  Will continue present management. Essential hypertension: Blood pressure well-controlled continue present management. Dyslipidemia I did review her K PN which only her LDL of 87 HDL 49.  Will recheck direct LDL again.  Since this is data from 2022. Chronic kidney disease that being followed by our nephrologist colleague. Bradycardia her metoprolol has been reduced from 100 mg to 50 mg daily will continue.  Again she is scheduled to see our EP colleagues for ventricle tachycardia which is a new problem   Medication Adjustments/Labs and Tests Ordered: Current medicines are reviewed at length with the patient today.  Concerns regarding medicines are outlined above.  No orders of the defined types were placed in this encounter.  Medication changes: No orders of the defined types were placed in this encounter.   Signed, Park Liter, MD, [facility] 08/01/2022 8:30 AM    [place]

## 2022-08-02 LAB — MAGNESIUM: Magnesium: 1.9 mg/dL (ref 1.6–2.3)

## 2022-08-06 ENCOUNTER — Encounter: Payer: Self-pay | Admitting: Cardiology

## 2022-08-08 ENCOUNTER — Telehealth: Payer: Self-pay

## 2022-08-08 NOTE — Telephone Encounter (Signed)
Results reviewed with pt as per Dr. Krasowski's note.  Pt verbalized understanding and had no additional questions. Routed to PCP  

## 2022-08-19 ENCOUNTER — Other Ambulatory Visit: Payer: Self-pay | Admitting: Cardiology

## 2022-08-21 ENCOUNTER — Other Ambulatory Visit: Payer: Self-pay | Admitting: Cardiology

## 2022-08-28 ENCOUNTER — Ambulatory Visit: Payer: BC Managed Care – PPO | Attending: Cardiology

## 2022-08-28 ENCOUNTER — Encounter: Payer: Self-pay | Admitting: Cardiology

## 2022-08-28 ENCOUNTER — Ambulatory Visit: Payer: BC Managed Care – PPO | Attending: Cardiology | Admitting: Cardiology

## 2022-08-28 VITALS — BP 122/76 | HR 70 | Ht 67.0 in | Wt 198.0 lb

## 2022-08-28 DIAGNOSIS — I493 Ventricular premature depolarization: Secondary | ICD-10-CM | POA: Diagnosis not present

## 2022-08-28 DIAGNOSIS — I5022 Chronic systolic (congestive) heart failure: Secondary | ICD-10-CM

## 2022-08-28 NOTE — Progress Notes (Signed)
Electrophysiology Office Note   Date:  08/28/2022   ID:  Meagan Allen, DOB 10/14/1955, MRN [id number]  PCP:  Leonel Ramsay, MD  Cardiologist:  Agustin Cree Primary Electrophysiologist:  Meagan Mcanelly Meredith Leeds, MD    Chief Complaint: PVC   History of Present Illness: Meagan Allen is a 67 y.o. female who is being seen today for the evaluation of PVC at the request of Park Liter, MD. Presenting today for electrophysiology evaluation.  He has a history significant for nonischemic cardiomyopathy, hypertension.  She had a cardiac catheterization several years ago that showed no obstructive coronary artery disease.  She also has hypertension.  She developed headaches and was found to have possible temporal arteritis.  She had a biopsy and was given high-dose steroids.  During the biopsy, her heart rate dropped into the 30s.  She wore a cardiac monitor that showed a 10% PVC burden as well as 3 runs of nonsustained VT, each less than 5 beats.  Today, she denies symptoms of palpitations, chest pain, shortness of breath, orthopnea, PND, lower extremity edema, claudication, dizziness, presyncope, syncope, bleeding, or neurologic sequela. The patient is tolerating medications without difficulties.  She is not having issues with hyperparathyroidism.  She has plans for possible parathyroidectomy.  She feels weak and fatigued but no shortness of breath.  She states that she is fatigued all of the time.   Past Medical History:  Diagnosis Date   Anemia    no recent iron transfusion per pt on 05-16-2021   Anxiety    Arthritis    osteo, inflammatory arthritis and rheumatoid arthritis   Cancer (HCC)    thyroid cancer   CHF (congestive heart failure) ([place])    diagnosed feb 2019   Deafness in right ear    Depression    Dysphagia 07/19/2020   occ   Dyspnea    on exertion on rare occasions   Enlarged liver    Expressive aphasia 03/15/2020   resolved per pt on 05-16-2021   Fibromyalgia     Gastroesophageal reflux disease with esophagitis without hemorrhage 07/19/2020   GERD (gastroesophageal reflux disease)    Headache disorder 03/15/2020   Heart murmur    High uric acid in 24 hour urine specimen 09/24/2019   History of colon polyps    History of corticosteroid therapy 07/13/2020   History of COVID-19 03/2021   mild symptoms x 5 days all symptoms resolved   History of thyroid cancer 2007;  2009   dx papillary thyroid cancer w/ mets to cervical lymph nodes   HOH (hard of hearing)    left ear   HTN (hypertension) 03/25/2012   Hypercalcemia 07/13/2020   Hyperlipidemia    Hypertension    Hypothyroidism 07/13/2020   IBS (irritable bowel syndrome)    constipation issues   Iron deficiency anemia 07/13/2020   Lesion of liver 07/19/2020   Meningioma ([place]) 03/29/2021   8 mm left frontal lobe unchanged suspected 3 mm left ant temporal lobe memingioma unchanged in size per 03-29-2021 brain mri   Migraine    Mood changes    Morbid obesity ([place]) 07/13/2020   Nonischemic cardiomyopathy [facility]) cardiologist-  dr Edyth Gunnels   per cardiac cath 08-28-2017  moderate LV dysfunction w/ diffuse hypocontractility ,  ef 35-40%   Optic nerve and pathway injury, left, initial encounter    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, per opthamologist black spot middle of left eye   PONV (postoperative nausea and vomiting)  last colonoscopy propfol bp dropped and vomiting   Post-surgical hypothyroidism    Primary localized osteoarthrosis of ankle and foot 02/13/2020   and hands   Rectal bleeding 05/02/2021   Rosacea    Thyroid cancer ([place]) 06/25/2006   surgery and radioactive iodine done   Type 2 diabetes mellitus ([place])    followed by pcp   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) FUSION OF THUMB Bilateral 2003   COLONOSCOPY WITH PROPOFOL N/A 05/06/2013   Procedure: COLONOSCOPY WITH PROPOFOL;  Surgeon: Garlan Fair, MD;  Location: [facility];  Service: Endoscopy;  Laterality: N/A;   D & C HYSTEROSCOPY /  RESECTION POLYP/ ROLLER BALL ABLATION  02-26-2004   dr Kathyrn Drown  [facility]   DILATION AND CURETTAGE OF UTERUS  1984   EXCISION MORTON'S NEUROMA Left 1996   giant cell tumor  2011   Left ankle 2011   JOINT REPLACEMENT Left 05/2020   2 [facility] of [place]   KNEE ARTHROSCOPY  08-16-2010  dr Beola Cord  [facility];  05/ 2012   right x2 left x1   LEFT HEART CATH AND CORONARY ANGIOGRAPHY N/A 08/28/2017   Procedure: LEFT HEART CATH AND CORONARY ANGIOGRAPHY;  Surgeon: Troy Sine, MD;  Location: [place] CV LAB;  Service: Cardiovascular;  Laterality: N/A;   large normal coronary arteries in a dominant RCA system;  moderate LV systoic dysfunction w/ diffuse hypocontractility (compatible w/ nonischemic cardiomyopathy), LV end diastoilc pressure normal, LVEF 35-45% by visual estimate   Left knee replaced   05/2010   NASAL LACRIMAL DUCT SURGERY  06/14/2009   REDO RIGHT MODIFIED RADICAL NECK DISSECTION  08-29-2007    [facility]   SHOULDER ARTHROSCOPY WITH ROTATOR CUFF REPAIR AND SUBACROMIAL DECOMPRESSION Left 10/17/2017   Procedure: Left shoulder mini open rotator cuff repair, subacromial decompression;  Surgeon: [name], MD;  Location: [facility];  Service: Orthopedics;  Laterality: Left;  Interscalene Block   TOTAL KNEE ARTHROPLASTY Right 06/03/2018   Procedure: RIGHT TOTAL KNEE ARTHROPLASTY;  Surgeon: Vickey Huger, MD;   Location: [facility];  Service: Orthopedics;  Laterality: Right;   TOTAL THYROIDECTOMY  2007  -- [facility]   w/ dissection lymph nodes   TRANSANAL HEMORRHOIDAL DEARTERIALIZATION N/A 05/20/2021   Procedure: TRANSANAL HEMORRHOIDAL DEARTERIALIZATION;  Surgeon: Leighton Ruff, MD;  Location: [facility];  Service: General;  Laterality: N/A;   TRANSTHORACIC ECHOCARDIOGRAM  08-20-2017  dr Agustin Cree   ef 25-30%, severe diffuse hypokinesis with no identifiable regional variations, but with profound dyssynchrony, due to arrhythmia insuffient to evaluate LV diastolic dysfunction/  trivial MR/  mild LAE/ Ventricular septum motion abnormal funtion,dyssynergy, & paradox   WRIST SURGERY Right 2001     Current Outpatient Medications  Medication Sig Dispense Refill   ACTEMRA ACTPEN 162 MG/0.9ML SOAJ Inject into the skin.     allopurinol (ZYLOPRIM) 300 MG tablet Take 300 mg by mouth daily.  amoxicillin (AMOXIL) 500 MG capsule Take 500 mg by mouth as needed (4 capsules prior to dental procedures).     aspirin EC 81 MG tablet Take 81 mg by mouth daily.     aspirin-acetaminophen-caffeine (EXCEDRIN MIGRAINE) 250-250-65 MG tablet Take 1 tablet by mouth every 6 (six) hours as needed for headache.     buPROPion (WELLBUTRIN XL) 150 MG 24 hr tablet Take 450 mg by mouth every morning.      cetirizine (ZYRTEC) 10 MG tablet Take 10 mg by mouth daily.     Cholecalciferol (VITAMIN D) 50 MCG (2000 UT) CAPS Take 1 capsule by mouth daily.     cinacalcet (SENSIPAR) 30 MG tablet Take 30 mg by mouth 3 (three) times a week. On hold Mon, Wed, Fri     clindamycin (CLEOCIN T) 1 % external solution Apply 1 application  topically 2 (two) times daily as needed (rash).     clonazePAM (KLONOPIN) 0.5 MG tablet Take 0.25 mg by mouth at bedtime.     doxycycline (VIBRAMYCIN) 100 MG capsule Take 100 mg by mouth as directed.     furosemide (LASIX) 20 MG tablet Take 1 tablet (20 mg total) by mouth daily. 90 tablet 2   glipiZIDE  (GLUCOTROL XL) 10 MG 24 hr tablet Take 1 tablet by mouth daily.     magnesium oxide (MAG-OX) 400 MG tablet Take 1 tablet (400 mg total) by mouth 2 (two) times daily. 60 tablet 3   mesalamine (LIALDA) 1.2 g EC tablet Take 1.2 g by mouth at bedtime.     metFORMIN (GLUCOPHAGE-XR) 500 MG 24 hr tablet Take 500-1,000 mg by mouth See admin instructions. Take 2 tablets (1,000 mg) in the morning and 1 tablet (500 mg) at bedtime     metoprolol succinate (TOPROL-XL) 50 MG 24 hr tablet Take 1 tablet (50 mg total) by mouth daily. Take with or immediately following a meal. 90 tablet 3   Misc Natural Products (COSAMIN ASU ADVANCED FORMULA) CAPS Take 2 tablets by mouth daily as needed (gout flare). Unknown strength     mometasone (ELOCON) 0.1 % lotion Apply 1 application  topically 2 (two) times daily as needed (dry skin).     nystatin (MYCOSTATIN/NYSTOP) powder Apply 1 application topically as needed (yeast).     pantoprazole (PROTONIX) 40 MG tablet Take 40 mg by mouth daily.     polyethylene glycol powder (GLYCOLAX/MIRALAX) powder Take 17 g by mouth daily as needed for mild constipation or moderate constipation (for constipation.).     rosuvastatin (CRESTOR) 5 MG tablet Take 5 mg by mouth 3 (three) times a week. Mon, Wed, Fri     sacubitril-valsartan (ENTRESTO) 49-51 MG Take 1 tablet by mouth 2 (two) times daily. 180 tablet 2   SYNTHROID 100 MCG tablet Take 100 mcg by mouth daily.     TRULANCE 3 MG TABS Take 1 tablet by mouth daily as needed (IBS).     zoledronic acid (RECLAST) 5 MG/100ML SOLN injection Inject 5 mg into the vein once. Q year     HUMIRA PEN 40 MG/0.4ML PNKT Inject 40 mg into the skin every 14 (fourteen) days.     No current facility-administered medications for this visit.    Allergies:   Hydroxychloroquine, Sulfa antibiotics, Ace inhibitors, Canagliflozin, Empagliflozin, Exenatide, Gabapentin, Lisinopril, Methotrexate, Pravastatin, and Propofol   Social History:  The patient  reports that  she quit smoking about 42 years ago. Her smoking use included cigarettes. She has a 30.00 pack-year smoking history.  She has been exposed to tobacco smoke. She has never used smokeless tobacco. She reports current alcohol use. She reports that she does not use drugs.   Family History:  The patient's family history includes Arthritis in her mother; Asthma in her son; Cancer in her brother; Depression in her mother; Diabetes in her father; Gout in her brother, brother, and brother; Heart disease in her father and sister; Hypertension in her father; Stroke in her mother.    ROS:  Please see the history of present illness.   Otherwise, review of systems is positive for none.   All other systems are reviewed and negative.    PHYSICAL EXAM: VS:  BP 122/76   Pulse 70   Ht 5' 7"$  (1.702 m)   Wt 198 lb (89.8 kg)   SpO2 98%   BMI 31.01 kg/m  , BMI Body mass index is 31.01 kg/m. GEN: Well nourished, well developed, in no acute distress  HEENT: normal  Neck: no JVD, carotid bruits, or masses Cardiac: RRR; no murmurs, rubs, or gallops,no edema  Respiratory:  clear to auscultation bilaterally, normal work of breathing GI: soft, nontender, nondistended, + BS MS: no deformity or atrophy  Skin: warm and dry Neuro:  Strength and sensation are intact Psych: euthymic mood, full affect  EKG:  EKG is ordered today. Personal review of the ekg ordered shows sinus rhythm, IVCD  Recent Labs: 05/26/2022: Platelets 281; TSH 1.292 05/27/2022: B Natriuretic Peptide 142.3; BUN 29; Creatinine, Ser 1.49; Potassium 4.0; Sodium 140 06/21/2022: Hemoglobin 12.1 08/01/2022: Magnesium 1.9    Lipid Panel     Component Value Date/Time   CHOL 192 03/27/2018 0937   TRIG 183 (H) 03/27/2018 0937   HDL 41 03/27/2018 0937   CHOLHDL 4.7 (H) 03/27/2018 0937   LDLCALC 114 (H) 03/27/2018 0937     Wt Readings from Last 3 Encounters:  08/28/22 198 lb (89.8 kg)  08/01/22 196 lb 1.9 oz (89 kg)  06/21/22 188 lb (85.3 kg)       Other studies Reviewed: Additional studies/ records that were reviewed today include: CMRI 07/05/22  Review of the above records today demonstrates:  1. Normal LV size and function, LVEF 51%.   2. There is no late gadolinium enhancement in the left ventricular myocardium.   3. There is no evidence for infiltrative myocardial disease.   4. Normal RV size and function.   5. Mild circumferential pericardial effusion.  Cardiac monitor 07/03/22 personally reviewed Monitor showed 3 runs of NSVT up to 5 beats. 2 SVT runs were noted up to 7 beats. Frequent PVC's with a 10% burden was noted.   ASSESSMENT AND PLAN:  1.  PVCs: 10% burden on cardiac monitor.  She has fatigue, though this is likely multifactorial.  Her ejection fraction is low normal.  At this point, as she has multiple ongoing issues, she would prefer to hold off on further therapy.  She states that she gets echoes quite often.  She Meagan Allen need 1 in 6 months.  Meagan Allen also plan for a 7-day monitor at that time.  She was on steroids at the time of her prior monitor.  2.  Chronic systolic heart failure: Due to nonischemic cardiomyopathy.  Ejection fraction 51% on cardiac MRI without scar.  Continue off to medical therapy per primary cardiology.  3.  Hypertension: Currently well-controlled  Discussed this with primary cardiology  Current medicines are reviewed at length with the patient today.   The patient does not have concerns regarding  her medicines.  The following changes were made today:  none  Labs/ tests ordered today include:  Orders Placed This Encounter  Procedures   LONG TERM MONITOR (3-14 DAYS)   EKG 12-Lead     Disposition:   FU with Meagan Allen 6 months  Signed, Raizel Wesolowski Meredith Leeds, MD  08/28/2022 10:16 AM     [facility] [address] [place]  [phone] (office) [phone] (fax)

## 2022-08-28 NOTE — Patient Instructions (Signed)
Medication Instructions:  Your physician recommends that you continue on your current medications as directed. Please refer to the Current Medication list given to you today.  *If you need a refill on your cardiac medications before your next appointment, please call your pharmacy*   Lab Work: None ordered   Testing/Procedures:                           ZIO XT- Long Term Monitor Instructions  Your physician has requested you wear a ZIO patch monitor for 7 days -- you will wear this about a month before your follow up with Dr. Curt Bears.  This is a single patch monitor. [employer] supplies one patch monitor per enrollment. Additional stickers are not available. Please do not apply patch if you will be having a Nuclear Stress Test,  Echocardiogram, Cardiac CT, MRI, or Chest Xray during the period you would be wearing the  monitor. The patch cannot be worn during these tests. You cannot remove and re-apply the  ZIO XT patch monitor.  Your ZIO patch monitor will be mailed 3 day USPS to your address on file. It may take 3-5 days  to receive your monitor after you have been enrolled.  Once you have received your monitor, please review the enclosed instructions. Your monitor  has already been registered assigning a specific monitor serial # to you.  Billing and Patient Assistance Program Information  We have supplied [employer] with any of your insurance information on file for billing purposes. [employer] offers a sliding scale Patient Assistance Program for patients that do not have  insurance, or whose insurance does not completely cover the cost of the ZIO monitor.  You must apply for the Patient Assistance Program to qualify for this discounted rate.  To apply, please call [employer] at [phone], select option 4, select option 2, ask to apply for  Patient Assistance Program. Theodore Demark will ask your household income, and how many people  are in your household. They will quote your out-of-pocket  cost based on that information.  [employer] will also be able to set up a 1-month interest-free payment plan if needed.  Applying the monitor   Shave hair from upper left chest.  Hold abrader disc by orange tab. Rub abrader in 40 strokes over the upper left chest as  indicated in your monitor instructions.  Clean area with 4 enclosed alcohol pads. Let dry.  Apply patch as indicated in monitor instructions. Patch will be placed under collarbone on left  side of chest with arrow pointing upward.  Rub patch adhesive wings for 2 minutes. Remove white label marked "1". Remove the white  label marked "2". Rub patch adhesive wings for 2 additional minutes.  While looking in a mirror, press and release button in center of patch. A small green light will  flash 3-4 times. This will be your only indicator that the monitor has been turned on.  Do not shower for the first 24 hours. You may shower after the first 24 hours.  Press the button if you feel a symptom. You will hear a small click. Record Date, Time and  Symptom in the Patient Logbook.  When you are ready to remove the patch, follow instructions on the last 2 pages of Patient  Logbook. Stick patch monitor onto the last page of Patient Logbook.  Place Patient Logbook in the blue and white box. Use locking tab on box and tape box closed  securely.  The blue and white box has prepaid postage on it. Please place it in the mailbox as  soon as possible. Your physician should have your test results approximately 7 days after the  monitor has been mailed back to [facility].  Call [place] at [phone] if you have questions regarding  your ZIO XT patch monitor. Call them immediately if you see an orange light blinking on your  monitor.  If your monitor falls off in less than 4 days, contact our Monitor department at [phone].  If your monitor becomes loose or falls off after 4 days call [employer] at [phone] for   suggestions on securing your monitor   Follow-Up: At [facility], you and your health needs are our priority.  As part of our continuing mission to provide you with exceptional heart care, we have created designated Provider Care Teams.  These Care Teams include your primary Cardiologist (physician) and Advanced Practice Providers (APPs -  Physician Assistants and Nurse Practitioners) who all work together to provide you with the care you need, when you need it.  Your next appointment:   6 month(s)  The format for your next appointment:   In Person  Provider:   Allegra Lai, MD    Thank you for choosing [facility]!!   Trinidad Curet, RN [phone]  Other Instructions

## 2022-08-28 NOTE — Progress Notes (Unsigned)
Enrolled for [employer] to mail a ZIO XT long term holter monitor to the patients address on file on 12/11/2022.

## 2022-09-19 ENCOUNTER — Encounter (INDEPENDENT_AMBULATORY_CARE_PROVIDER_SITE_OTHER): Payer: Self-pay | Admitting: Vascular Surgery

## 2022-09-22 ENCOUNTER — Other Ambulatory Visit (HOSPITAL_COMMUNITY): Payer: BC Managed Care – PPO

## 2022-10-21 ENCOUNTER — Other Ambulatory Visit: Payer: Self-pay | Admitting: Cardiology

## 2022-10-24 DIAGNOSIS — M67442 Ganglion, left hand: Secondary | ICD-10-CM | POA: Insufficient documentation

## 2022-11-14 ENCOUNTER — Encounter: Payer: Self-pay | Admitting: Cardiology

## 2022-11-14 ENCOUNTER — Ambulatory Visit: Payer: BC Managed Care – PPO | Attending: Cardiology | Admitting: Cardiology

## 2022-11-14 VITALS — BP 118/82 | HR 72 | Ht 67.0 in | Wt 202.0 lb

## 2022-11-14 DIAGNOSIS — R001 Bradycardia, unspecified: Secondary | ICD-10-CM | POA: Diagnosis not present

## 2022-11-14 DIAGNOSIS — I10 Essential (primary) hypertension: Secondary | ICD-10-CM

## 2022-11-14 DIAGNOSIS — I428 Other cardiomyopathies: Secondary | ICD-10-CM

## 2022-11-14 NOTE — Addendum Note (Signed)
Addended by: Baldo Ash D on: 11/14/2022 08:40 AM   Modules accepted: Orders

## 2022-11-14 NOTE — Addendum Note (Signed)
Addended by: Baldo Ash D on: 11/14/2022 08:45 AM   Modules accepted: Orders

## 2022-11-14 NOTE — Patient Instructions (Addendum)
Medication Instructions:  Your physician recommends that you continue on your current medications as directed. Please refer to the Current Medication list given to you today.  *If you need a refill on your cardiac medications before your next appointment, please call your pharmacy*   Lab Work: BMP- MGM- today If you have labs (blood work) drawn today and your tests are completely normal, you will receive your results only by: MyChart Message (if you have MyChart) OR A paper copy in the mail If you have any lab test that is abnormal or we need to change your treatment, we will call you to review the results.   Testing/Procedures: Your physician has requested that you have an echocardiogram. Echocardiography is a painless test that uses sound waves to create images of your heart. It provides your doctor with information about the size and shape of your heart and how well your heart's chambers and valves are working. This procedure takes approximately one hour. There are no restrictions for this procedure. Please do NOT wear cologne, perfume, aftershave, or lotions (deodorant is allowed). Please arrive 15 minutes prior to your appointment time.  Zio- Already ordered by Dr. Elberta Fortis and will be coming in the mail    Follow-Up: At [facility], you and your health needs are our priority.  As part of our continuing mission to provide you with exceptional heart care, we have created designated Provider Care Teams.  These Care Teams include your primary Cardiologist (physician) and Advanced Practice Providers (APPs -  Physician Assistants and Nurse Practitioners) who all work together to provide you with the care you need, when you need it.  We recommend signing up for the patient portal called "MyChart".  Sign up information is provided on this After Visit Summary.  MyChart is used to connect with patients for Virtual Visits (Telemedicine).  Patients are able to view lab/test results, encounter  notes, upcoming appointments, etc.  Non-urgent messages can be sent to your provider as well.   To learn more about what you can do with MyChart, go to [email or web address].    Your next appointment:   5 month(s)  The format for your next appointment:   In Person  Provider:   Gypsy Balsam, MD    Other Instructions NA

## 2022-11-14 NOTE — Addendum Note (Signed)
Addended by: Baldo Ash D on: 11/14/2022 09:00 AM   Modules accepted: Orders

## 2022-11-14 NOTE — Progress Notes (Signed)
Cardiology Office Note:    Date:  11/14/2022   ID:  Meagan Allen, DOB [birth year], MRN [id number]  PCP:  Mick Sell, MD  Cardiologist:  Gypsy Balsam, MD    Referring MD: Mick Sell, MD   Chief Complaint  Patient presents with   Follow-up    Weight gain, want to discuss medicines     History of Present Illness:    Meagan Allen is a 67 y.o. female    with past medical history significant for nonischemic cardiomyopathy, HTN initial ejection fraction 30 to 35% but improved with guideline directed medical therapy, cardiac catheterization done a few years ago showed no significant obstructive disease. Additional problem include essential hypertension, diabetes. Recently she started experiencing some headache. She went to her rheumatologist suspicion for left temporal arteritis has been raised. Then she ended up having biopsy of this lesion after she was given high-dose of steroids. While she was having surgery there was significant drop in her heart rate down to 30 and eventually she ended up being transferred to the emergency room in the emergency room her metoprolol has been completely discontinued and she has been doing better in terms of dizziness since that time. Additional problem include neuropathy in lower extremities which make her unsteady on her feet  Last time I seen her concern was ejection fraction being low normal at 51% as proven by MRI, she also wore monitor which show high burden of PVCs 10% with 3 runs of nonsustained ventricular tachycardia.  After that he seen our EP team and concern was that this probably post steroids we waiting until June to do another echocardiogram as well as another monitor.  She is more or less doing okay trying to appropriate medications for rheumatoid arthritis she tried Actemra however felt horrible on it.  Now she is taking Humira which seems to be doing quite well.  Denies have any palpitations no dizziness no passing out.   Recently came back from [place] where he seen her cousin.  Past Medical History:  Diagnosis Date   Anemia    no recent iron transfusion per pt on 05-16-2021   Anxiety    Arthritis    osteo, inflammatory arthritis and rheumatoid arthritis   Cancer (HCC)    thyroid cancer   CHF (congestive heart failure) (HCC)    diagnosed feb 2019   Deafness in right ear    Depression    Dysphagia 07/19/2020   occ   Dyspnea    on exertion on rare occasions   Enlarged liver    Expressive aphasia 03/15/2020   resolved per pt on 05-16-2021   Fibromyalgia    Gastroesophageal reflux disease with esophagitis without hemorrhage 07/19/2020   GERD (gastroesophageal reflux disease)    Headache disorder 03/15/2020   Heart murmur    High uric acid in 24 hour urine specimen 09/24/2019   History of colon polyps    History of corticosteroid therapy 07/13/2020   History of COVID-19 03/2021   mild symptoms x 5 days all symptoms resolved   History of thyroid cancer 2007;  2009   dx papillary thyroid cancer w/ mets to cervical lymph nodes   HOH (hard of hearing)    left ear   HTN (hypertension) 03/25/2012   Hypercalcemia 07/13/2020   Hyperlipidemia    Hypertension    Hypothyroidism 07/13/2020   IBS (irritable bowel syndrome)    constipation issues   Iron deficiency anemia 07/13/2020   Lesion of liver 07/19/2020  Meningioma (HCC) 03/29/2021   8 mm left frontal lobe unchanged suspected 3 mm left ant temporal lobe memingioma unchanged in size per 03-29-2021 brain mri   Migraine    Mood changes    Morbid obesity (HCC) 07/13/2020   Nonischemic cardiomyopathy [facility]) cardiologist-  dr Townsend Roger   per cardiac cath 08-28-2017  moderate LV dysfunction w/ diffuse hypocontractility ,  ef 35-40%   Optic nerve and pathway injury, left, initial encounter    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, per opthamologist black spot middle of left eye   PONV (postoperative nausea and vomiting)    last colonoscopy  propfol bp dropped and vomiting   Post-surgical hypothyroidism    Primary localized osteoarthrosis of ankle and foot 02/13/2020   and hands   Rectal bleeding 05/02/2021   Rosacea    Thyroid cancer (HCC) 06/25/2006   surgery and radioactive iodine done   Type 2 diabetes mellitus (HCC)    followed by pcp   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HCC)    Wears glasses    Wears hearing aid in both ears    left ear hearing aid, microphone in right ear    Past Surgical History:  Procedure Laterality Date   CARDIOVASCULAR STRESS TEST  08-20-2017   dr Bing Matter   High risk nuclear study w/ large irreverisible defect in the basal and mid inferoseptal, inferior, inferolateral walls and apical septal, inferior walls with small peri infarct ischemia in the apical anterior & lateral walls (consistant w/ prior MI )/  no ST segment deviation noted /  nuclear stress ef 36%/  recommended cardiac cath   CARPOMETACARPAL [facility]) FUSION OF THUMB Bilateral 2003   COLONOSCOPY WITH PROPOFOL N/A 05/06/2013   Procedure: COLONOSCOPY WITH PROPOFOL;  Surgeon: Charolett Bumpers, MD;  Location: [facility];  Service: Endoscopy;  Laterality: N/A;   D & C HYSTEROSCOPY /  RESECTION POLYP/ ROLLER BALL ABLATION  02-26-2004   dr Carey Bullocks  [facility]   DILATION AND CURETTAGE OF UTERUS  1984   EXCISION MORTON'S NEUROMA Left 1996   giant cell tumor  2011   Left ankle 2011   JOINT REPLACEMENT Left 05/2020   2 [facility] [place]   KNEE ARTHROSCOPY  08-16-2010  dr Lestine Box  [facility];  05/ 2012   right x2 left x1   LEFT HEART CATH AND CORONARY ANGIOGRAPHY N/A 08/28/2017   Procedure: LEFT HEART CATH AND CORONARY ANGIOGRAPHY;  Surgeon: Lennette Bihari, MD;  Location: [facility] INVASIVE CV LAB;  Service: Cardiovascular;  Laterality: N/A;   large normal coronary arteries in a dominant RCA system;  moderate LV systoic dysfunction w/ diffuse hypocontractility (compatible w/ nonischemic cardiomyopathy), LV end diastoilc pressure normal, LVEF 35-45% by  visual estimate   Left knee replaced   05/2010   NASAL LACRIMAL DUCT SURGERY  06/14/2009   REDO RIGHT MODIFIED RADICAL NECK DISSECTION  08-29-2007    [facility]   SHOULDER ARTHROSCOPY WITH ROTATOR CUFF REPAIR AND SUBACROMIAL DECOMPRESSION Left 10/17/2017   Procedure: Left shoulder mini open rotator cuff repair, subacromial decompression;  Surgeon: Jene Every, MD;  Location: [facility] ORS;  Service: Orthopedics;  Laterality: Left;  Interscalene Block   TOTAL KNEE ARTHROPLASTY Right 06/03/2018   Procedure: RIGHT TOTAL KNEE ARTHROPLASTY;  Surgeon: Dannielle Huh, MD;  Location: [facility];  Service: Orthopedics;  Laterality: Right;   TOTAL THYROIDECTOMY  2007  -- [facility]   w/ dissection lymph nodes   TRANSANAL HEMORRHOIDAL DEARTERIALIZATION N/A 05/20/2021   Procedure: TRANSANAL HEMORRHOIDAL DEARTERIALIZATION;  Surgeon: Romie Levee,  MD;  Location: [facility];  Service: General;  Laterality: N/A;   TRANSTHORACIC ECHOCARDIOGRAM  08-20-2017  dr Bing Matter   ef 25-30%, severe diffuse hypokinesis with no identifiable regional variations, but with profound dyssynchrony, due to arrhythmia insuffient to evaluate LV diastolic dysfunction/  trivial MR/  mild LAE/ Ventricular septum motion abnormal funtion,dyssynergy, & paradox   WRIST SURGERY Right 2001    Current Medications: Current Meds  Medication Sig   allopurinol (ZYLOPRIM) 300 MG tablet Take 33 mg by mouth daily.   amoxicillin (AMOXIL) 500 MG capsule Take 500 mg by mouth as needed (4 capsules prior to dental procedures).   aspirin EC 81 MG tablet Take 81 mg by mouth daily.   aspirin-acetaminophen-caffeine (EXCEDRIN MIGRAINE) 250-250-65 MG tablet Take 1 tablet by mouth every 6 (six) hours as needed for headache.   buPROPion (WELLBUTRIN XL) 150 MG 24 hr tablet Take 450 mg by mouth every morning.    cetirizine (ZYRTEC) 10 MG tablet Take 10 mg by mouth daily.   Cholecalciferol (VITAMIN D) 50 MCG (2000 UT) CAPS Take 1 capsule by mouth daily.    cinacalcet (SENSIPAR) 30 MG tablet Take 30 mg by mouth 3 (three) times a week. On hold Mon, Wed, Fri   clindamycin (CLEOCIN T) 1 % external solution Apply 1 application  topically 2 (two) times daily as needed (rash).   clonazePAM (KLONOPIN) 0.5 MG tablet Take 0.25 mg by mouth at bedtime.   doxycycline (VIBRAMYCIN) 100 MG capsule Take 100 mg by mouth as directed.   furosemide (LASIX) 20 MG tablet Take 1 tablet (20 mg total) by mouth daily.   glipiZIDE (GLUCOTROL XL) 10 MG 24 hr tablet Take 1 tablet by mouth daily.   HUMIRA PEN 40 MG/0.4ML PNKT Inject 40 mg into the skin every 14 (fourteen) days.   magnesium oxide (MAG-OX) 400 MG tablet Take 1 tablet (400 mg total) by mouth 2 (two) times daily.   mesalamine (LIALDA) 1.2 g EC tablet Take 1.2 g by mouth at bedtime.   metFORMIN (GLUCOPHAGE-XR) 500 MG 24 hr tablet Take 500-1,000 mg by mouth See admin instructions. Take 2 tablets (1,000 mg) in the morning and 1 tablet (500 mg) at bedtime   metoprolol succinate (TOPROL-XL) 50 MG 24 hr tablet Take 1 tablet (50 mg total) by mouth daily. Take with or immediately following a meal.   Misc Natural Products (COSAMIN ASU ADVANCED FORMULA) CAPS Take 2 tablets by mouth daily as needed (gout flare). Unknown strength   mometasone (ELOCON) 0.1 % lotion Apply 1 application  topically 2 (two) times daily as needed (dry skin).   nystatin (MYCOSTATIN/NYSTOP) powder Apply 1 application topically as needed (yeast).   pantoprazole (PROTONIX) 40 MG tablet Take 40 mg by mouth daily.   polyethylene glycol powder (GLYCOLAX/MIRALAX) powder Take 17 g by mouth daily as needed for mild constipation or moderate constipation (for constipation.).   rosuvastatin (CRESTOR) 5 MG tablet Take 5 mg by mouth 3 (three) times a week. Mon, Wed, Fri   sacubitril-valsartan (ENTRESTO) 49-51 MG Take 1 tablet by mouth 2 (two) times daily.   SYNTHROID 100 MCG tablet Take 100 mcg by mouth daily.   TRULANCE 3 MG TABS Take 1 tablet by mouth daily as  needed (IBS).   zoledronic acid (RECLAST) 5 MG/100ML SOLN injection Inject 5 mg into the vein once. Q year     Allergies:   Hydroxychloroquine, Sulfa antibiotics, Ace inhibitors, Canagliflozin, Empagliflozin, Exenatide, Gabapentin, Lisinopril, Methotrexate, Pravastatin, and Propofol   Social History  Socioeconomic History   Marital status: Married    Spouse name: Fayrene Fearing   Number of children: 2   Years of education: 12   Highest education level: Not on file  Occupational History   Occupation: Production manager for [employer]    Employer: [employer]  Tobacco Use   Smoking status: Former    Packs/day: 3.00    Years: 10.00    Additional pack years: 0.00    Total pack years: 30.00    Types: Cigarettes    Quit date: 07/17/1980    Years since quitting: 42.3    Passive exposure: Past   Smokeless tobacco: Never  Vaping Use   Vaping Use: Never used  Substance and Sexual Activity   Alcohol use: Yes    Comment: very occ   Drug use: No   Sexual activity: Yes    Birth control/protection: Post-menopausal  Other Topics Concern   Not on file  Social History Narrative   Sabreen was born in [place], [place]. She moved to [place] in 1978 when her family moved to this state. Earnest currently lives in [place] with her husband of 32 years. They have 2 adult children and 1 grandson. 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 She enjoys gardening.   Social Determinants of Health   Financial Resource Strain: Not on file  Food Insecurity: No Food Insecurity (05/26/2022)   Hunger Vital Sign    Worried About Running Out of Food in the Last Year: Never true    Ran Out of Food in the Last Year: Never true  Transportation Needs: No Transportation Needs (05/26/2022)   PRAPARE - Administrator, Civil Service (Medical): No    Lack of Transportation (Non-Medical): No  Physical Activity: Not on file  Stress: Not on file  Social Connections: Not on file      Family History: The patient's family history includes Arthritis in her mother; Asthma in her son; Cancer in her brother; Depression in her mother; Diabetes in her father; Gout in her brother, brother, and brother; Heart disease in her father and sister; Hypertension in her father; Stroke in her mother. There is no history of Breast cancer. ROS:   Please see the history of present illness.    All 14 point review of systems negative except as described per history of present illness  EKGs/Labs/Other Studies Reviewed:      Recent Labs: 05/26/2022: Platelets 281; TSH 1.292 05/27/2022: B Natriuretic Peptide 142.3; BUN 29; Creatinine, Ser 1.49; Potassium 4.0; Sodium 140 06/21/2022: Hemoglobin 12.1 08/01/2022: Magnesium 1.9  Recent Lipid Panel    Component Value Date/Time   CHOL 192 03/27/2018 0937   TRIG 183 (H) 03/27/2018 0937   HDL 41 03/27/2018 0937   CHOLHDL 4.7 (H) 03/27/2018 0937   LDLCALC 114 (H) 03/27/2018 0937    Physical Exam:    VS:  BP 118/82 (BP Location: Left Arm, Patient Position: Sitting)   Pulse 72   Ht 5\' 7"  (1.702 m)   Wt 202 lb (91.6 kg)   SpO2 96%   BMI 31.64 kg/m     Wt Readings from Last 3 Encounters:  11/14/22 202 lb (91.6 kg)  08/28/22 198 lb (89.8 kg)  08/01/22 196 lb 1.9 oz (89 kg)     GEN:  Well nourished, well developed in no acute distress HEENT: Normal NECK: No JVD; No carotid bruits LYMPHATICS: No lymphadenopathy CARDIAC: RRR, no murmurs, no rubs, no gallops RESPIRATORY:  Clear to auscultation  without rales, wheezing or rhonchi  ABDOMEN: Soft, non-tender, non-distended MUSCULOSKELETAL:  No edema; No deformity  SKIN: Warm and dry LOWER EXTREMITIES: no swelling NEUROLOGIC:  Alert and oriented x 3 PSYCHIATRIC:  Normal affect   ASSESSMENT:    1. Nonischemic cardiomyopathy (HCC)   2. Primary hypertension   3. Bradycardia    PLAN:    In order of problems listed above:  Nonischemic cardiomyopathy.  On guideline directed medical  therapy that she can tolerate I will continue for now echocardiogram will be scheduled in June. Essential hypertension blood pressure well-controlled continue present management. Frequent ventricular ectopy with nonsustained ventricular tachycardia no dizziness no passing out no palpitations.  Monitor will be placed in June.  She does have appoint with our EP team. Bradycardia not critical denies having the symptoms. Dyslipidemia I did review K PN which show me LDL 77 HDL 48.  Will continue present management.   Medication Adjustments/Labs and Tests Ordered: Current medicines are reviewed at length with the patient today.  Concerns regarding medicines are outlined above.  No orders of the defined types were placed in this encounter.  Medication changes: No orders of the defined types were placed in this encounter.   Signed, Georgeanna Lea, MD, [facility] 11/14/2022 8:30 AM    [facility]

## 2022-11-15 LAB — BASIC METABOLIC PANEL
BUN/Creatinine Ratio: 14 (ref 12–28)
BUN: 19 mg/dL (ref 8–27)
CO2: 20 mmol/L (ref 20–29)
Calcium: 9.5 mg/dL (ref 8.7–10.3)
Chloride: 104 mmol/L (ref 96–106)
Creatinine, Ser: 1.32 mg/dL — ABNORMAL HIGH (ref 0.57–1.00)
Glucose: 157 mg/dL — ABNORMAL HIGH (ref 70–99)
Potassium: 4.2 mmol/L (ref 3.5–5.2)
Sodium: 143 mmol/L (ref 134–144)
eGFR: 44 mL/min/{1.73_m2} — ABNORMAL LOW (ref >59)

## 2022-11-15 LAB — MAGNESIUM: Magnesium: 1.8 mg/dL (ref 1.6–2.3)

## 2022-11-16 ENCOUNTER — Encounter: Payer: Self-pay | Admitting: Cardiology

## 2022-11-21 ENCOUNTER — Telehealth: Payer: Self-pay | Admitting: Cardiology

## 2022-11-21 NOTE — Telephone Encounter (Signed)
Pt called to let office know she sent a MyChart message to MD and would like for someone to call her back to f/u. Please advise

## 2022-11-23 ENCOUNTER — Telehealth: Payer: Self-pay

## 2022-11-23 DIAGNOSIS — R001 Bradycardia, unspecified: Secondary | ICD-10-CM

## 2022-11-23 NOTE — Telephone Encounter (Signed)
Zio 14 days per Dr. Bing Matter. Order entered.

## 2022-11-23 NOTE — Telephone Encounter (Signed)
MyChart message sent to pt

## 2022-11-27 ENCOUNTER — Ambulatory Visit: Payer: BC Managed Care – PPO | Attending: Cardiology

## 2022-11-27 DIAGNOSIS — R001 Bradycardia, unspecified: Secondary | ICD-10-CM | POA: Diagnosis not present

## 2022-11-30 ENCOUNTER — Ambulatory Visit (HOSPITAL_BASED_OUTPATIENT_CLINIC_OR_DEPARTMENT_OTHER)
Admission: RE | Admit: 2022-11-30 | Discharge: 2022-11-30 | Disposition: A | Discharge status: Home / Self Care | Payer: BC Managed Care – PPO | Source: Ambulatory Visit | Attending: Cardiology | Admitting: Cardiology

## 2022-11-30 DIAGNOSIS — I428 Other cardiomyopathies: Secondary | ICD-10-CM | POA: Insufficient documentation

## 2022-11-30 LAB — ECHOCARDIOGRAM COMPLETE
Area-P 1/2: 2.77 cm2
S' Lateral: 3.2 cm

## 2022-12-01 ENCOUNTER — Ambulatory Visit: Payer: BC Managed Care – PPO | Admitting: Podiatry

## 2022-12-01 ENCOUNTER — Ambulatory Visit (INDEPENDENT_AMBULATORY_CARE_PROVIDER_SITE_OTHER): Payer: BC Managed Care – PPO

## 2022-12-01 ENCOUNTER — Encounter: Payer: Self-pay | Admitting: Podiatry

## 2022-12-01 DIAGNOSIS — M778 Other enthesopathies, not elsewhere classified: Secondary | ICD-10-CM

## 2022-12-01 DIAGNOSIS — M2041 Other hammer toe(s) (acquired), right foot: Secondary | ICD-10-CM

## 2022-12-01 DIAGNOSIS — L309 Dermatitis, unspecified: Secondary | ICD-10-CM | POA: Diagnosis not present

## 2022-12-01 MED ORDER — TRIAMCINOLONE ACETONIDE 0.025 % EX OINT: 1.0000 | TOPICAL_OINTMENT | Freq: Two times a day (BID) | CUTANEOUS | 0 refills | Status: DC

## 2022-12-01 NOTE — Progress Notes (Unsigned)
Subjective: Chief Complaint  Patient presents with   Foot Pain    Plantar forefoot/2nd toe right - aching x a month, noticing toe is starting to curl more   Skin Problem    Medial foot right - red, splotchy rash x months, doesn't itch, peels away after it calluses, dermatologist eval-didn't know, rheumatologist eval-wasn't sure either   New Patient (Initial Visit)    It does not itch or hut. It will get dry and hard and peel off. She has not tried any medication for this issue. It is getting better.   Feel slike a "wad" under hte ball of hte foot and 2nd toe is curing up at night. She notices it with socks on.   No injuries  Occasional numbness and tingling. She was diagnosed last year with "e polyneuropathy". She saw another neurologist after   A1c 6.4   Objective: AAO x3, NAD DP/PT pulses palpable bilaterally, CRT less than 3 seconds Protective sensation intact with Simms Weinstein monofilament, vibratory sensation intact, Achilles tendon reflex intact No areas of pinpoint bony tenderness or pain with vibratory sensation. MMT 5/5, ROM WNL. No edema, erythema, increase in warmth to bilateral lower extremities.  No open lesions or pre-ulcerative lesions.  No pain with calf compression, swelling, warmth, erythema  Assessment:  Plan: -All treatment options discussed with the patient including all alternatives, risks, complications.   -Patient encouraged to call the office with any questions, concerns, change in symptoms.

## 2022-12-07 ENCOUNTER — Telehealth: Payer: Self-pay

## 2022-12-07 NOTE — Telephone Encounter (Signed)
-----   Message from Georgeanna Lea, MD sent at 12/07/2022 10:39 AM EDT ----- 400 mg twice a day of magnesium ----- Message ----- From: Heywood Bene, CMA Sent: 11/17/2022   4:35 PM EDT To: Georgeanna Lea, MD  How many mg for the Mg? ----- Message ----- From: Georgeanna Lea, MD Sent: 11/16/2022   8:41 AM EDT To: Neena Rhymes, RN  Chem-7 looks fine, magnesium is on the lower side so she probably need to take magnesium twice daily for her milligrams

## 2022-12-07 NOTE — Telephone Encounter (Signed)
Patient notified through my chart.

## 2022-12-12 DIAGNOSIS — M898X8 Other specified disorders of bone, other site: Secondary | ICD-10-CM | POA: Insufficient documentation

## 2022-12-13 ENCOUNTER — Telehealth: Payer: Self-pay

## 2022-12-13 NOTE — Telephone Encounter (Signed)
Left message on My Chart with normal results per Dr. Krasowski's note. Routed to PCP. 

## 2022-12-13 NOTE — Telephone Encounter (Signed)
Pt viewed results in My Chart per Dr. Krasowski's note. Routed to PCP.  

## 2023-01-13 ENCOUNTER — Encounter: Payer: Self-pay | Admitting: Cardiology

## 2023-01-19 ENCOUNTER — Telehealth: Payer: Self-pay | Admitting: *Deleted

## 2023-01-19 NOTE — Telephone Encounter (Addendum)
Spoke to pt about [facility] follow  up next month. Aware no PVCs on recent monitor. Informed/explained that she does not need to see Dr. Elberta Fortis again at this time.  Cancelled August appt w/ [facility]. Aware Dr. Bing Matter will send her back to [place] if this changes. Patient verbalized understanding and agreeable to plan.

## 2023-01-30 DIAGNOSIS — T8189XA Other complications of procedures, not elsewhere classified, initial encounter: Secondary | ICD-10-CM | POA: Insufficient documentation

## 2023-02-07 DIAGNOSIS — R29898 Other symptoms and signs involving the musculoskeletal system: Secondary | ICD-10-CM | POA: Insufficient documentation

## 2023-02-07 DIAGNOSIS — Z9181 History of falling: Secondary | ICD-10-CM | POA: Insufficient documentation

## 2023-02-26 ENCOUNTER — Other Ambulatory Visit: Payer: Self-pay | Admitting: Infectious Diseases

## 2023-02-26 DIAGNOSIS — Z1231 Encounter for screening mammogram for malignant neoplasm of breast: Secondary | ICD-10-CM

## 2023-03-08 ENCOUNTER — Ambulatory Visit: Payer: BC Managed Care – PPO | Admitting: Cardiology

## 2023-04-03 ENCOUNTER — Ambulatory Visit: Payer: BC Managed Care – PPO | Attending: Cardiology | Admitting: Cardiology

## 2023-04-03 ENCOUNTER — Encounter: Payer: Self-pay | Admitting: Cardiology

## 2023-04-03 VITALS — BP 122/70 | HR 67 | Ht 67.0 in | Wt 209.0 lb

## 2023-04-03 DIAGNOSIS — I4729 Other ventricular tachycardia: Secondary | ICD-10-CM

## 2023-04-03 DIAGNOSIS — I428 Other cardiomyopathies: Secondary | ICD-10-CM | POA: Diagnosis not present

## 2023-04-03 DIAGNOSIS — I10 Essential (primary) hypertension: Secondary | ICD-10-CM

## 2023-04-03 DIAGNOSIS — N1832 Chronic kidney disease, stage 3b: Secondary | ICD-10-CM

## 2023-04-03 DIAGNOSIS — E1122 Type 2 diabetes mellitus with diabetic chronic kidney disease: Secondary | ICD-10-CM

## 2023-04-03 DIAGNOSIS — I5042 Chronic combined systolic (congestive) and diastolic (congestive) heart failure: Secondary | ICD-10-CM

## 2023-04-03 NOTE — Progress Notes (Signed)
Cardiology Office Note:    Date:  04/03/2023   ID:  Meagan Allen, DOB 1956/01/05, MRN [id number]  PCP:  Mick Sell, MD  Cardiologist:  Gypsy Balsam, MD    Referring MD: Mick Sell, MD   Chief Complaint  Patient presents with   Follow-up    History of Present Illness:    Meagan Allen is a 67 y.o. female    with past medical history significant for nonischemic cardiomyopathy, HTN initial ejection fraction 30 to 35% but improved with guideline directed medical therapy, cardiac catheterization done a few years ago showed no significant obstructive disease. Additional problem include essential hypertension, diabetes. Recently she started experiencing some headache. She went to her rheumatologist suspicion for left temporal arteritis has been raised. Then she ended up having biopsy of this lesion after she was given high-dose of steroids. While she was having surgery there was significant drop in her heart rate down to 30 and eventually she ended up being transferred to the emergency room in the emergency room her metoprolol has been completely discontinued and she has been doing better in terms of dizziness since that time. Additional problem include neuropathy in lower extremities which make her unsteady on her feet  Last time I seen her concern was ejection fraction being low normal at 51% as proven by MRI, she also wore monitor which show high burden of PVCs 10% with 3 runs of nonsustained ventricular tachycardia.  After that he seen our EP team and concern was that this probably post steroids we waiting until June to do another echocardiogram as well as another monitor. Comes today to months or today.  She did have echocardiogram done in summer which showed preserved left ventricle ejection fraction.  Overall she is doing good medical wise denies have any shortness of breath.  Fatigue tiredness and multiple joint aches is still there.  Past Medical History:  Diagnosis  Date   Anemia    no recent iron transfusion per pt on 05-16-2021   Anxiety    Arthritis    osteo, inflammatory arthritis and rheumatoid arthritis   Cancer (HCC)    thyroid cancer   CHF (congestive heart failure) (HCC)    diagnosed feb 2019   Deafness in right ear    Depression    Dysphagia 07/19/2020   occ   Dyspnea    on exertion on rare occasions   Enlarged liver    Expressive aphasia 03/15/2020   resolved per pt on 05-16-2021   Fibromyalgia    Gastroesophageal reflux disease with esophagitis without hemorrhage 07/19/2020   GERD (gastroesophageal reflux disease)    Headache disorder 03/15/2020   Heart murmur    High uric acid in 24 hour urine specimen 09/24/2019   History of colon polyps    History of corticosteroid therapy 07/13/2020   History of COVID-19 03/2021   mild symptoms x 5 days all symptoms resolved   History of thyroid cancer 2007;  2009   dx papillary thyroid cancer w/ mets to cervical lymph nodes   HOH (hard of hearing)    left ear   HTN (hypertension) 03/25/2012   Hypercalcemia 07/13/2020   Hyperlipidemia    Hypertension    Hypothyroidism 07/13/2020   IBS (irritable bowel syndrome)    constipation issues   Iron deficiency anemia 07/13/2020   Lesion of liver 07/19/2020   Meningioma (HCC) 03/29/2021   8 mm left frontal lobe unchanged suspected 3 mm left ant temporal lobe memingioma unchanged  in size per 03-29-2021 brain mri   Migraine    Mood changes    Morbid obesity (HCC) 07/13/2020   Nonischemic cardiomyopathy [facility]) cardiologist-  dr Townsend Roger   per cardiac cath 08-28-2017  moderate LV dysfunction w/ diffuse hypocontractility ,  ef 35-40%   Optic nerve and pathway injury, left, initial encounter    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, per opthamologist black spot middle of left eye   PONV (postoperative nausea and vomiting)    last colonoscopy propfol bp dropped and vomiting   Post-surgical hypothyroidism    Primary localized osteoarthrosis  of ankle and foot 02/13/2020   and hands   Rectal bleeding 05/02/2021   Rosacea    Thyroid cancer (HCC) 06/25/2006   surgery and radioactive iodine done   Type 2 diabetes mellitus (HCC)    followed by pcp   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HCC)    Wears glasses    Wears hearing aid in both ears    left ear hearing aid, microphone in right ear    Past Surgical History:  Procedure Laterality Date   CARDIOVASCULAR STRESS TEST  08-20-2017   dr Bing Matter   High risk nuclear study w/ large irreverisible defect in the basal and mid inferoseptal, inferior, inferolateral walls and apical septal, inferior walls with small peri infarct ischemia in the apical anterior & lateral walls (consistant w/ prior MI )/  no ST segment deviation noted /  nuclear stress ef 36%/  recommended cardiac cath   CARPOMETACARPAL [facility]) FUSION OF THUMB Bilateral 2003   COLONOSCOPY WITH PROPOFOL N/A 05/06/2013   Procedure: COLONOSCOPY WITH PROPOFOL;  Surgeon: Charolett Bumpers, MD;  Location: [facility] ENDOSCOPY;  Service: Endoscopy;  Laterality: N/A;   D & C HYSTEROSCOPY /  RESECTION POLYP/ ROLLER BALL ABLATION  02-26-2004   dr Carey Bullocks  [facility]   DILATION AND CURETTAGE OF UTERUS  1984   EXCISION MORTON'S NEUROMA Left 1996   giant cell tumor  2011   Left ankle 2011   JOINT REPLACEMENT Left 05/2020   2 [facility] [place]   KNEE ARTHROSCOPY  08-16-2010  dr Lestine Box  [facility];  05/ 2012   right x2 left x1   LEFT HEART CATH AND CORONARY ANGIOGRAPHY N/A 08/28/2017   Procedure: LEFT HEART CATH AND CORONARY ANGIOGRAPHY;  Surgeon: Lennette Bihari, MD;  Location: [facility] INVASIVE CV LAB;  Service: Cardiovascular;  Laterality: N/A;   large normal coronary arteries in a dominant RCA system;  moderate LV systoic dysfunction w/ diffuse hypocontractility (compatible w/ nonischemic cardiomyopathy), LV end diastoilc pressure normal, LVEF 35-45% by visual estimate   Left knee replaced   05/2010   NASAL LACRIMAL DUCT SURGERY  06/14/2009   REDO RIGHT  MODIFIED RADICAL NECK DISSECTION  08-29-2007    [facility]   SHOULDER ARTHROSCOPY WITH ROTATOR CUFF REPAIR AND SUBACROMIAL DECOMPRESSION Left 10/17/2017   Procedure: Left shoulder mini open rotator cuff repair, subacromial decompression;  Surgeon: Jene Every, MD;  Location: [facility] ORS;  Service: Orthopedics;  Laterality: Left;  Interscalene Block   TOTAL KNEE ARTHROPLASTY Right 06/03/2018   Procedure: RIGHT TOTAL KNEE ARTHROPLASTY;  Surgeon: Dannielle Huh, MD;  Location: [facility] ORS;  Service: Orthopedics;  Laterality: Right;   TOTAL THYROIDECTOMY  2007  -- [facility]   w/ dissection lymph nodes   TRANSANAL HEMORRHOIDAL DEARTERIALIZATION N/A 05/20/2021   Procedure: TRANSANAL HEMORRHOIDAL DEARTERIALIZATION;  Surgeon: Romie Levee, MD;  Location: [facility];  Service: General;  Laterality: N/A;   TRANSTHORACIC ECHOCARDIOGRAM  08-20-2017  dr Bing Matter   ef 25-30%, severe diffuse hypokinesis with no identifiable regional variations, but with profound dyssynchrony, due to arrhythmia insuffient to evaluate LV diastolic dysfunction/  trivial MR/  mild LAE/ Ventricular septum motion abnormal funtion,dyssynergy, & paradox   WRIST SURGERY Right 2001    Current Medications: Current Meds  Medication Sig   allopurinol (ZYLOPRIM) 300 MG tablet Take 33 mg by mouth daily.   amoxicillin (AMOXIL) 500 MG capsule Take 500 mg by mouth as needed (4 capsules prior to dental procedures).   aspirin EC 81 MG tablet Take 81 mg by mouth daily.   aspirin-acetaminophen-caffeine (EXCEDRIN MIGRAINE) 250-250-65 MG tablet Take 1 tablet by mouth every 6 (six) hours as needed for headache.   buPROPion (WELLBUTRIN XL) 150 MG 24 hr tablet Take 450 mg by mouth every morning.    cetirizine (ZYRTEC) 10 MG tablet Take 10 mg by mouth daily.   Cholecalciferol (VITAMIN D) 50 MCG (2000 UT) CAPS Take 1 capsule by mouth daily.   cinacalcet (SENSIPAR) 30 MG tablet Take 30 mg by mouth 3 (three) times a week. On hold Mon, Wed, Fri    clindamycin (CLEOCIN T) 1 % external solution Apply 1 application  topically 2 (two) times daily as needed (rash).   clonazePAM (KLONOPIN) 0.5 MG tablet Take 0.25 mg by mouth at bedtime.   doxycycline (VIBRAMYCIN) 100 MG capsule Take 100 mg by mouth as directed.   furosemide (LASIX) 20 MG tablet Take 1 tablet (20 mg total) by mouth daily.   glipiZIDE (GLUCOTROL XL) 5 MG 24 hr tablet Take 5 mg by mouth daily.   HUMIRA PEN 40 MG/0.4ML PNKT Inject 40 mg into the skin every 14 (fourteen) days.   levothyroxine (SYNTHROID) 112 MCG tablet Take 100 mcg by mouth daily.   magnesium oxide (MAG-OX) 400 MG tablet Take 1 tablet (400 mg total) by mouth 2 (two) times daily.   mesalamine (LIALDA) 1.2 g EC tablet Take 1.2 g by mouth at bedtime.   metFORMIN (GLUCOPHAGE-XR) 500 MG 24 hr tablet Take 500-1,000 mg by mouth See admin instructions. Take 2 tablets (1,000 mg) in the morning and 1 tablet (500 mg) at bedtime   metoprolol succinate (TOPROL-XL) 50 MG 24 hr tablet Take 1 tablet (50 mg total) by mouth daily. Take with or immediately following a meal.   Misc Natural Products (COSAMIN ASU ADVANCED FORMULA) CAPS Take 2 tablets by mouth daily. Unknown strength   mometasone (ELOCON) 0.1 % lotion Apply 1 application  topically 2 (two) times daily as needed (dry skin).   nystatin (MYCOSTATIN/NYSTOP) powder Apply 1 application topically as needed (yeast).   pantoprazole (PROTONIX) 40 MG tablet Take 40 mg by mouth daily.   polyethylene glycol powder (GLYCOLAX/MIRALAX) powder Take 17 g by mouth daily as needed for mild constipation or moderate constipation (for constipation.).   rosuvastatin (CRESTOR) 5 MG tablet Take 5 mg by mouth 3 (three) times a week. Mon, Wed, Fri   sacubitril-valsartan (ENTRESTO) 49-51 MG Take 1 tablet by mouth 2 (two) times daily.   triamcinolone (KENALOG) 0.025 % ointment Apply 1 Application topically 2 (two) times daily.   TRULANCE 3 MG TABS Take 1 tablet by mouth daily as needed (IBS).    zoledronic acid (RECLAST) 5 MG/100ML SOLN injection Inject 5 mg into the vein once. Q year   [DISCONTINUED] sacubitril-valsartan (ENTRESTO) 49-51 MG Take 1 tablet by mouth 2 (two) times daily.     Allergies:   Hydroxychloroquine, Sulfa antibiotics, Ace inhibitors, Canagliflozin, Empagliflozin, Exenatide, Gabapentin, Lisinopril,  Methotrexate, Pravastatin, and Propofol   Social History   Socioeconomic History   Marital status: Married    Spouse name: Fayrene Fearing   Number of children: 2   Years of education: 12   Highest education level: Not on file  Occupational History   Occupation: Production manager for [employer]    Employer: [employer]  Tobacco Use   Smoking status: Former    Current packs/day: 0.00    Average packs/day: 3.0 packs/day for 10.0 years (30.0 ttl pk-yrs)    Types: Cigarettes    Start date: 07/17/1970    Quit date: 07/17/1980    Years since quitting: 42.7    Passive exposure: Past   Smokeless tobacco: Never  Vaping Use   Vaping status: Never Used  Substance and Sexual Activity   Alcohol use: Yes    Comment: very occ   Drug use: No   Sexual activity: Yes    Birth control/protection: Post-menopausal  Other Topics Concern   Not on file  Social History Narrative   Rickia was born in [place], [place]. She moved to [place] in 1978 when her family moved to this state. Aislin currently lives in [place] with her husband of 32 years. They have 2 adult children and 1 grandson. 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 She enjoys gardening.   Social Determinants of Health   Financial Resource Strain: Not on file  Food Insecurity: No Food Insecurity (05/26/2022)   Hunger Vital Sign    Worried About Running Out of Food in the Last Year: Never true    Ran Out of Food in the Last Year: Never true  Transportation Needs: No Transportation Needs (05/26/2022)   PRAPARE - Administrator, Civil Service (Medical): No    Lack of Transportation  (Non-Medical): No  Physical Activity: Not on file  Stress: Not on file  Social Connections: Not on file     Family History: The patient's family history includes Arthritis in her mother; Asthma in her son; Cancer in her brother; Depression in her mother; Diabetes in her father; Gout in her brother, brother, and brother; Heart disease in her father and sister; Hypertension in her father; Stroke in her mother. There is no history of Breast cancer. ROS:   Please see the history of present illness.    All 14 point review of systems negative except as described per history of present illness  EKGs/Labs/Other Studies Reviewed:         Recent Labs: 05/26/2022: Platelets 281; TSH 1.292 05/27/2022: B Natriuretic Peptide 142.3 06/21/2022: Hemoglobin 12.1 11/14/2022: BUN 19; Creatinine, Ser 1.32; Magnesium 1.8; Potassium 4.2; Sodium 143  Recent Lipid Panel    Component Value Date/Time   CHOL 192 03/27/2018 0937   TRIG 183 (H) 03/27/2018 0937   HDL 41 03/27/2018 0937   CHOLHDL 4.7 (H) 03/27/2018 0937   LDLCALC 114 (H) 03/27/2018 0937    Physical Exam:    VS:  BP 122/70 (BP Location: Left Arm, Patient Position: Sitting)   Pulse 67   Ht 5\' 7"  (1.702 m)   Wt 209 lb (94.8 kg)   SpO2 95%   BMI 32.73 kg/m     Wt Readings from Last 3 Encounters:  04/03/23 209 lb (94.8 kg)  11/14/22 202 lb (91.6 kg)  08/28/22 198 lb (89.8 kg)     GEN:  Well nourished, well developed in no acute distress HEENT: Normal NECK: No JVD; No carotid bruits LYMPHATICS: No lymphadenopathy CARDIAC:  RRR, no murmurs, no rubs, no gallops RESPIRATORY:  Clear to auscultation without rales, wheezing or rhonchi  ABDOMEN: Soft, non-tender, non-distended MUSCULOSKELETAL:  No edema; No deformity  SKIN: Warm and dry LOWER EXTREMITIES: no swelling NEUROLOGIC:  Alert and oriented x 3 PSYCHIATRIC:  Normal affect   ASSESSMENT:    1. Nonischemic cardiomyopathy (HCC)   2. Primary hypertension   3. Chronic combined  systolic and diastolic congestive heart failure (HCC)   4. Nonsustained ventricular tachycardia (HCC)   5. Type 2 diabetes mellitus with stage 3b chronic kidney disease, unspecified whether long term insulin use (HCC)    PLAN:    In order of problems listed above:  History of nonischemic cardiomyopathy on guideline directed medical therapy with improvement and normalization of left ventricular ejection fraction.  Will continue present management. Dyslipidemia I did review K PN which did not show me any cholesterol but I have her cholesterol from [facility] done 6 months ago LDL 80 HDL 50.6.  Will continue present management. Chronic systolic congestive heart failure.  Compensated. History of nonsustained ventricular tachycardia denies having any dizziness or passing out. Diabetes followed by antimedicine team.   Medication Adjustments/Labs and Tests Ordered: Current medicines are reviewed at length with the patient today.  Concerns regarding medicines are outlined above.  No orders of the defined types were placed in this encounter.  Medication changes: No orders of the defined types were placed in this encounter.   Signed, Georgeanna Lea, MD, [facility] 04/03/2023 8:28 AM     [facility]

## 2023-04-03 NOTE — Addendum Note (Signed)
Addended by: Baldo Ash D on: 04/03/2023 08:42 AM   Modules accepted: Orders

## 2023-04-03 NOTE — Patient Instructions (Addendum)
Medication Instructions:  Your physician recommends that you continue on your current medications as directed. Please refer to the Current Medication list given to you today.  *If you need a refill on your cardiac medications before your next appointment, please call your pharmacy*   Lab Work: 3rd Floor [address] Lipid panel- today If you have labs (blood work) drawn today and your tests are completely normal, you will receive your results only by: MyChart Message (if you have MyChart) OR A paper copy in the mail If you have any lab test that is abnormal or we need to change your treatment, we will call you to review the results.   Testing/Procedures: None Ordered   Follow-Up: At [facility], you and your health needs are our priority.  As part of our continuing mission to provide you with exceptional heart care, we have created designated Provider Care Teams.  These Care Teams include your primary Cardiologist (physician) and Advanced Practice Providers (APPs -  Physician Assistants and Nurse Practitioners) who all work together to provide you with the care you need, when you need it.  We recommend signing up for the patient portal called "MyChart".  Sign up information is provided on this After Visit Summary.  MyChart is used to connect with patients for Virtual Visits (Telemedicine).  Patients are able to view lab/test results, encounter notes, upcoming appointments, etc.  Non-urgent messages can be sent to your provider as well.   To learn more about what you can do with MyChart, go to [email or web address].    Your next appointment:   6 month(s)  The format for your next appointment:   In Person  Provider:   Gypsy Balsam, MD    Other Instructions NA

## 2023-04-09 DIAGNOSIS — M67449 Ganglion, unspecified hand: Secondary | ICD-10-CM | POA: Insufficient documentation

## 2023-04-09 DIAGNOSIS — M6530 Trigger finger, unspecified finger: Secondary | ICD-10-CM | POA: Insufficient documentation

## 2023-04-13 ENCOUNTER — Ambulatory Visit
Admission: RE | Admit: 2023-04-13 | Discharge: 2023-04-13 | Disposition: A | Discharge status: Home / Self Care | Payer: BC Managed Care – PPO | Source: Ambulatory Visit | Attending: Infectious Diseases | Admitting: Infectious Diseases

## 2023-04-13 DIAGNOSIS — Z1231 Encounter for screening mammogram for malignant neoplasm of breast: Secondary | ICD-10-CM

## 2023-06-19 ENCOUNTER — Other Ambulatory Visit: Payer: Self-pay | Admitting: Cardiology

## 2023-06-19 NOTE — Telephone Encounter (Signed)
Rx refill sent to pharmacy. 

## 2023-06-28 ENCOUNTER — Ambulatory Visit
Admission: RE | Admit: 2023-06-28 | Discharge: 2023-06-28 | Disposition: A | Discharge status: Home / Self Care | Payer: BC Managed Care – PPO | Source: Ambulatory Visit | Attending: Physician Assistant | Admitting: Physician Assistant

## 2023-06-28 ENCOUNTER — Other Ambulatory Visit: Payer: Self-pay | Admitting: Physician Assistant

## 2023-06-28 DIAGNOSIS — R109 Unspecified abdominal pain: Secondary | ICD-10-CM

## 2023-06-28 DIAGNOSIS — R1011 Right upper quadrant pain: Secondary | ICD-10-CM

## 2023-06-28 DIAGNOSIS — R319 Hematuria, unspecified: Secondary | ICD-10-CM

## 2023-07-02 ENCOUNTER — Other Ambulatory Visit: Payer: Self-pay | Admitting: Physician Assistant

## 2023-07-02 DIAGNOSIS — R1011 Right upper quadrant pain: Secondary | ICD-10-CM

## 2023-07-03 ENCOUNTER — Telehealth: Payer: Self-pay

## 2023-07-03 NOTE — Telephone Encounter (Signed)
Dx code updated for DOS 04/03/2023 and faxed to [facility] for billing purposes

## 2023-07-25 DIAGNOSIS — R209 Unspecified disturbances of skin sensation: Secondary | ICD-10-CM | POA: Insufficient documentation

## 2023-07-25 DIAGNOSIS — I872 Venous insufficiency (chronic) (peripheral): Secondary | ICD-10-CM | POA: Insufficient documentation

## 2023-07-25 DIAGNOSIS — R140 Abdominal distension (gaseous): Secondary | ICD-10-CM | POA: Insufficient documentation

## 2023-07-25 DIAGNOSIS — K5900 Constipation, unspecified: Secondary | ICD-10-CM | POA: Insufficient documentation

## 2023-07-25 DIAGNOSIS — R42 Dizziness and giddiness: Secondary | ICD-10-CM | POA: Insufficient documentation

## 2023-07-27 ENCOUNTER — Ambulatory Visit
Admission: RE | Admit: 2023-07-27 | Discharge: 2023-07-27 | Disposition: A | Discharge status: Home / Self Care | Payer: BC Managed Care – PPO | Source: Ambulatory Visit | Attending: Physician Assistant

## 2023-07-27 DIAGNOSIS — R1011 Right upper quadrant pain: Secondary | ICD-10-CM

## 2023-07-30 ENCOUNTER — Ambulatory Visit
Admission: RE | Admit: 2023-07-30 | Discharge: 2023-07-30 | Disposition: A | Discharge status: Home / Self Care | Payer: BC Managed Care – PPO | Source: Ambulatory Visit | Attending: Gastroenterology | Admitting: Gastroenterology

## 2023-07-30 ENCOUNTER — Other Ambulatory Visit: Payer: Self-pay | Admitting: Gastroenterology

## 2023-07-30 DIAGNOSIS — K5900 Constipation, unspecified: Secondary | ICD-10-CM

## 2023-08-15 ENCOUNTER — Ambulatory Visit: Payer: Self-pay | Admitting: Urology

## 2023-09-04 ENCOUNTER — Other Ambulatory Visit: Payer: Self-pay | Admitting: Student

## 2023-09-04 DIAGNOSIS — M4810 Ankylosing hyperostosis [Forestier], site unspecified: Secondary | ICD-10-CM | POA: Insufficient documentation

## 2023-09-04 DIAGNOSIS — H9202 Otalgia, left ear: Secondary | ICD-10-CM

## 2023-09-04 DIAGNOSIS — G8929 Other chronic pain: Secondary | ICD-10-CM | POA: Insufficient documentation

## 2023-09-05 ENCOUNTER — Ambulatory Visit: Payer: BC Managed Care – PPO | Admitting: Urology

## 2023-09-10 ENCOUNTER — Ambulatory Visit
Admission: RE | Admit: 2023-09-10 | Discharge: 2023-09-10 | Disposition: A | Discharge status: Home / Self Care | Payer: BC Managed Care – PPO | Source: Ambulatory Visit | Attending: Student | Admitting: Student

## 2023-09-10 DIAGNOSIS — H9202 Otalgia, left ear: Secondary | ICD-10-CM

## 2023-09-10 MED ORDER — IOPAMIDOL (ISOVUE-300) INJECTION 61%
75.0000 mL | Freq: Once | INTRAVENOUS | Status: AC | PRN
  Administered 2023-09-10: 75 mL via INTRAVENOUS

## 2023-09-12 ENCOUNTER — Encounter: Payer: Self-pay | Admitting: Urology

## 2023-09-12 ENCOUNTER — Ambulatory Visit: Payer: BC Managed Care – PPO | Admitting: Urology

## 2023-09-12 VITALS — BP 133/84 | HR 74 | Ht 67.0 in | Wt 212.0 lb

## 2023-09-12 DIAGNOSIS — Z87440 Personal history of urinary (tract) infections: Secondary | ICD-10-CM | POA: Diagnosis not present

## 2023-09-12 DIAGNOSIS — N390 Urinary tract infection, site not specified: Secondary | ICD-10-CM

## 2023-09-12 DIAGNOSIS — R399 Unspecified symptoms and signs involving the genitourinary system: Secondary | ICD-10-CM

## 2023-09-12 DIAGNOSIS — R8271 Bacteriuria: Secondary | ICD-10-CM

## 2023-09-12 LAB — URINALYSIS, COMPLETE
Bilirubin, UA: NEGATIVE
Ketones, UA: NEGATIVE
Leukocytes,UA: NEGATIVE
Nitrite, UA: NEGATIVE
Protein,UA: NEGATIVE
RBC, UA: NEGATIVE
Specific Gravity, UA: 1.015 (ref 1.005–1.030)
Urobilinogen, Ur: 0.2 mg/dL (ref 0.2–1.0)
pH, UA: 5.5 (ref 5.0–7.5)

## 2023-09-12 LAB — BLADDER SCAN AMB NON-IMAGING: Scan Result: 20

## 2023-09-12 LAB — MICROSCOPIC EXAMINATION

## 2023-09-12 MED ORDER — GEMTESA 75 MG PO TABS
75.0000 mg | ORAL_TABLET | Freq: Every day | ORAL | Status: DC
Start: 2023-09-12 — End: 2024-05-07

## 2023-09-12 NOTE — Progress Notes (Signed)
 I, Maysun Anabel Bene, acting as a scribe for Riki Altes, MD., have documented all relevant documentation on the behalf of Riki Altes, MD, as directed by Riki Altes, MD while in the presence of Riki Altes, MD.  09/12/2023 2:27 PM   Meagan Allen [birth year] [id number]  Referring provider: Mick Sell, MD [address] [place],  [place] [address]  Chief Complaint  Patient presents with   Recurrent UTI    HPI: Meagan Allen is a 68 y.o. female referred for evaluation of recurrent UTIs.   Recurrent Klebsiella bacteria beginning December 2024 with positive cultures 06/13/2023, 07/20/2023, 08/01/2023, and 08/16/2023 She has chronic symptoms with variability including urinary frequency, urgency, and a weak urinary stream, nocturia and hesitancy. She denies dysuria or pelvic pain. . CT abdomen pelvis with contrast performed 07/27/2023 showed bilateral, non-obstructing renal calculi all <5 mm, which were stable from previous scans.  Denies gross hematuria.  No significant change in her symptoms with antibiotic treatments.   PMH: Past Medical History:  Diagnosis Date   Anemia    no recent iron transfusion per pt on 05-16-2021   Anxiety    Arthritis    osteo, inflammatory arthritis and rheumatoid arthritis   Cancer (HCC)    thyroid cancer   CHF (congestive heart failure) (HCC)    diagnosed feb 2019   Deafness in right ear    Depression    Dysphagia 07/19/2020   occ   Dyspnea    on exertion on rare occasions   Enlarged liver    Expressive aphasia 03/15/2020   resolved per pt on 05-16-2021   Fibromyalgia    Gastroesophageal reflux disease with esophagitis without hemorrhage 07/19/2020   GERD (gastroesophageal reflux disease)    Headache disorder 03/15/2020   Heart murmur    High uric acid in 24 hour urine specimen 09/24/2019   History of colon polyps    History of corticosteroid therapy 07/13/2020   History of COVID-19 03/2021   mild symptoms x  5 days all symptoms resolved   History of thyroid cancer 2007;  2009   dx papillary thyroid cancer w/ mets to cervical lymph nodes   HOH (hard of hearing)    left ear   HTN (hypertension) 03/25/2012   Hypercalcemia 07/13/2020   Hyperlipidemia    Hypertension    Hypothyroidism 07/13/2020   IBS (irritable bowel syndrome)    constipation issues   Iron deficiency anemia 07/13/2020   Lesion of liver 07/19/2020   Meningioma (HCC) 03/29/2021   8 mm left frontal lobe unchanged suspected 3 mm left ant temporal lobe memingioma unchanged in size per 03-29-2021 brain mri   Migraine    Mood changes    Morbid obesity (HCC) 07/13/2020   Nonischemic cardiomyopathy [facility]) cardiologist-  dr Townsend Roger   per cardiac cath 08-28-2017  moderate LV dysfunction w/ diffuse hypocontractility ,  ef 35-40%   Optic nerve and pathway injury, left, initial encounter    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, per opthamologist black spot middle of left eye   PONV (postoperative nausea and vomiting)    last colonoscopy propfol bp dropped and vomiting   Post-surgical hypothyroidism    Primary localized osteoarthrosis of ankle and foot 02/13/2020   and hands   Rectal bleeding 05/02/2021   Rosacea    Thyroid cancer (HCC) 06/25/2006   surgery and radioactive iodine done   Type 2 diabetes mellitus (HCC)    followed by pcp  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HCC)    Wears glasses    Wears hearing aid in both ears    left ear hearing aid, microphone in right ear    Surgical History: Past Surgical History:  Procedure Laterality Date   CARDIOVASCULAR STRESS TEST  08-20-2017   dr Bing Matter   High risk nuclear study w/ large irreverisible defect in the basal and mid inferoseptal, inferior, inferolateral walls and apical septal, inferior walls with small peri infarct ischemia in the apical anterior & lateral walls (consistant w/ prior MI )/  no ST segment deviation noted /  nuclear stress ef 36%/  recommended cardiac cath    CARPOMETACARPAL [facility]) FUSION OF THUMB Bilateral 2003   COLONOSCOPY WITH PROPOFOL N/A 05/06/2013   Procedure: COLONOSCOPY WITH PROPOFOL;  Surgeon: Charolett Bumpers, MD;  Location: [facility];  Service: Endoscopy;  Laterality: N/A;   D & C HYSTEROSCOPY /  RESECTION POLYP/ ROLLER BALL ABLATION  02-26-2004   dr Carey Bullocks  [facility]   DILATION AND CURETTAGE OF UTERUS  1984   EXCISION MORTON'S NEUROMA Left 1996   giant cell tumor  2011   Left ankle 2011   JOINT REPLACEMENT Left 05/2020   2 [facility] [place]   KNEE ARTHROSCOPY  08-16-2010  dr Lestine Box  [facility];  05/ 2012   right x2 left x1   LEFT HEART CATH AND CORONARY ANGIOGRAPHY N/A 08/28/2017   Procedure: LEFT HEART CATH AND CORONARY ANGIOGRAPHY;  Surgeon: Lennette Bihari, MD;  Location: [facility] INVASIVE CV LAB;  Service: Cardiovascular;  Laterality: N/A;   large normal coronary arteries in a dominant RCA system;  moderate LV systoic dysfunction w/ diffuse hypocontractility (compatible w/ nonischemic cardiomyopathy), LV end diastoilc pressure normal, LVEF 35-45% by visual estimate   Left knee replaced   05/2010   NASAL LACRIMAL DUCT SURGERY  06/14/2009   REDO RIGHT MODIFIED RADICAL NECK DISSECTION  08-29-2007    [facility]   SHOULDER ARTHROSCOPY WITH ROTATOR CUFF REPAIR AND SUBACROMIAL DECOMPRESSION Left 10/17/2017   Procedure: Left shoulder mini open rotator cuff repair, subacromial decompression;  Surgeon: Jene Every, MD;  Location: [facility];  Service: Orthopedics;  Laterality: Left;  Interscalene Block   TOTAL KNEE ARTHROPLASTY Right 06/03/2018   Procedure: RIGHT TOTAL KNEE ARTHROPLASTY;  Surgeon: Dannielle Huh, MD;  Location: [facility];  Service: Orthopedics;  Laterality: Right;   TOTAL THYROIDECTOMY  2007  -- [facility]   w/ dissection lymph nodes   TRANSANAL HEMORRHOIDAL DEARTERIALIZATION N/A 05/20/2021   Procedure: TRANSANAL HEMORRHOIDAL DEARTERIALIZATION;  Surgeon: Romie Levee, MD;  Location: [facility];  Service: General;   Laterality: N/A;   TRANSTHORACIC ECHOCARDIOGRAM  08-20-2017  dr Bing Matter   ef 25-30%, severe diffuse hypokinesis with no identifiable regional variations, but with profound dyssynchrony, due to arrhythmia insuffient to evaluate LV diastolic dysfunction/  trivial MR/  mild LAE/ Ventricular septum motion abnormal funtion,dyssynergy, & paradox   WRIST SURGERY Right 2001    Home Medications:  Allergies as of 09/12/2023       Reactions   Hydroxychloroquine Other (See Comments)   blisters in mouth   Sulfa Antibiotics Hives, Other (See Comments)   Ace Inhibitors Other (See Comments)   fell out   Canagliflozin Other (See Comments)   Yeast issues.   Empagliflozin Other (See Comments)   Yeast issues.   Exenatide Other (See Comments)   Yeast issues.   Gabapentin Nausea And Vomiting   Lisinopril Other (See Comments)   Pt is unsure of exact reaction   Methotrexate  Other (See Comments)   Elevated LFT   Pravastatin Other (See Comments)   Body pain.   Propofol Nausea And Vomiting, Other (See Comments)   Blood pressure bottomed out/syncope        Medication List        Accurate as of September 12, 2023  2:27 PM. If you have any questions, ask your nurse or doctor.          allopurinol 300 MG tablet Commonly known as: ZYLOPRIM Take 33 mg by mouth daily.   amoxicillin 500 MG capsule Commonly known as: AMOXIL Take 500 mg by mouth as needed (4 capsules prior to dental procedures).   aspirin EC 81 MG tablet Take 81 mg by mouth daily.   aspirin-acetaminophen-caffeine 250-250-65 MG tablet Commonly known as: EXCEDRIN MIGRAINE Take 1 tablet by mouth every 6 (six) hours as needed for headache.   buPROPion 150 MG 24 hr tablet Commonly known as: WELLBUTRIN XL Take 450 mg by mouth every morning.   cetirizine 10 MG tablet Commonly known as: ZYRTEC Take 10 mg by mouth daily.   cinacalcet 30 MG tablet Commonly known as: SENSIPAR Take 30 mg by mouth 3 (three) times a week. On hold  Mon, Wed, Fri   clindamycin 1 % external solution Commonly known as: CLEOCIN T Apply 1 application  topically 2 (two) times daily as needed (rash).   clonazePAM 0.5 MG tablet Commonly known as: KLONOPIN Take 0.25 mg by mouth at bedtime.   Cosamin ASU Advanced Formula Caps Take 2 tablets by mouth daily. Unknown strength   doxycycline 100 MG capsule Commonly known as: VIBRAMYCIN Take 100 mg by mouth as directed.   Entresto 49-51 MG Generic drug: sacubitril-valsartan TAKE 1 TABLET BY MOUTH TWICE A DAY   furosemide 20 MG tablet Commonly known as: LASIX Take 1 tablet (20 mg total) by mouth daily.   Gemtesa 75 MG Tabs Generic drug: Vibegron Take 1 tablet (75 mg total) by mouth daily. Started by: Riki Altes   glipiZIDE 5 MG 24 hr tablet Commonly known as: GLUCOTROL XL Take 5 mg by mouth daily.   Humira (2 Pen) 40 MG/0.4ML pen Generic drug: adalimumab Inject 40 mg into the skin every 14 (fourteen) days.   levothyroxine 112 MCG tablet Commonly known as: SYNTHROID Take 100 mcg by mouth daily.   magnesium oxide 400 MG tablet Commonly known as: MAG-OX Take 1 tablet (400 mg total) by mouth 2 (two) times daily.   mesalamine 1.2 g EC tablet Commonly known as: LIALDA Take 1.2 g by mouth at bedtime.   metFORMIN 500 MG 24 hr tablet Commonly known as: GLUCOPHAGE-XR Take 500-1,000 mg by mouth See admin instructions. Take 2 tablets (1,000 mg) in the morning and 1 tablet (500 mg) at bedtime   metoprolol succinate 50 MG 24 hr tablet Commonly known as: TOPROL-XL Take 1 tablet (50 mg total) by mouth daily. Take with or immediately following a meal.   mometasone 0.1 % lotion Commonly known as: ELOCON Apply 1 application  topically 2 (two) times daily as needed (dry skin).   nystatin powder Commonly known as: MYCOSTATIN/NYSTOP Apply 1 application topically as needed (yeast).   pantoprazole 40 MG tablet Commonly known as: PROTONIX Take 40 mg by mouth daily.    polyethylene glycol powder 17 GM/SCOOP powder Commonly known as: GLYCOLAX/MIRALAX Take 17 g by mouth daily as needed for mild constipation or moderate constipation (for constipation.).   rosuvastatin 5 MG tablet Commonly known as: CRESTOR Take 5 mg  by mouth 3 (three) times a week. Mon, Wed, Fri   triamcinolone 0.025 % ointment Commonly known as: KENALOG Apply 1 Application topically 2 (two) times daily.   Trulance 3 MG Tabs Generic drug: Plecanatide Take 1 tablet by mouth daily as needed (IBS).   Vitamin D 50 MCG (2000 UT) Caps Take 1 capsule by mouth daily.   zoledronic acid 5 MG/100ML Soln injection Commonly known as: RECLAST Inject 5 mg into the vein once. Q year        Allergies:  Allergies  Allergen Reactions   Hydroxychloroquine Other (See Comments)    blisters in mouth   Sulfa Antibiotics Hives and Other (See Comments)   Ace Inhibitors Other (See Comments)    fell out   Canagliflozin Other (See Comments)    Yeast issues.   Empagliflozin Other (See Comments)    Yeast issues.   Exenatide Other (See Comments)    Yeast issues.   Gabapentin Nausea And Vomiting   Lisinopril Other (See Comments)    Pt is unsure of exact reaction   Methotrexate Other (See Comments)    Elevated LFT    Pravastatin Other (See Comments)    Body pain.   Propofol Nausea And Vomiting and Other (See Comments)    Blood pressure bottomed out/syncope    Family History: Family History  Problem Relation Age of Onset   Heart disease Father        MI died age 59   Hypertension Father    Diabetes Father    Arthritis Mother        rheumatoid athritis   Stroke Mother    Depression Mother    Gout Brother    Cancer Brother        thyroid CA   Gout Brother    Gout Brother    Heart disease Sister        69 months old   Asthma Son    Breast cancer Neg Hx     Social History:  reports that she quit smoking about 43 years ago. Her smoking use included cigarettes. She started smoking  about 53 years ago. She has a 30 pack-year smoking history. She has been exposed to tobacco smoke. She has never used smokeless tobacco. She reports current alcohol use. She reports that she does not use drugs.   Physical Exam: BP 133/84   Pulse 74   Ht 5\' 7"  (1.702 m)   Wt 212 lb (96.2 kg)   BMI 33.20 kg/m   Constitutional:  Alert, No acute distress. HEENT: [place] AT Respiratory: Normal respiratory effort, no increased work of breathing. Psychiatric: Normal mood and affect.   Urinalysis Dipstick 3+ glucose/microscopy negative  Pertinent Imaging  CT was personally reviewed and interpreted.   CT  EXAM: CT ABDOMEN AND PELVIS WITHOUT CONTRAST   TECHNIQUE: Multidetector CT imaging of the abdomen and pelvis was performed following the standard protocol without IV contrast.   RADIATION DOSE REDUCTION: This exam was performed according to the departmental dose-optimization program which includes automated exposure control, adjustment of the mA and/or kV according to patient size and/or use of iterative reconstruction technique.   COMPARISON:  06/28/2023 in 11/17/2021   FINDINGS: Lower chest: No acute findings.   Hepatobiliary: 2.2 cm low-attenuation lesion in the left hepatic lobe is stable since previous studies, consistent with benign etiology. No other liver lesions identified on this unenhanced exam. Gallbladder is unremarkable. No evidence of biliary ductal dilatation.   Pancreas: No mass or inflammatory process  visualized on this unenhanced exam.   Spleen:  Within normal limits in size.   Adrenals/Urinary tract: Several tiny less than 5 mm calculi again seen in both kidneys. No evidence of ureteral calculi or hydronephrosis. Unremarkable unopacified urinary bladder.   Stomach/Bowel: No evidence of obstruction, inflammatory process, or abnormal fluid collections. Normal appendix visualized. Mild sigmoid diverticulosis noted, without signs of diverticulitis.    Vascular/Lymphatic: No pathologically enlarged lymph nodes identified. No evidence of abdominal aortic aneurysm.   Reproductive: A few small uterine fibroids are again seen measuring 2-3 cm in size. Adnexal regions are unremarkable.   Other:  None.   Musculoskeletal:  No suspicious bone lesions identified.   IMPRESSION: Tiny bilateral renal calculi. No evidence of ureteral calculi, hydronephrosis, or other acute findings.   Mild sigmoid diverticulosis, without radiographic evidence of diverticulitis.   Small uterine fibroids.     Electronically Signed   By: Danae Orleans M.D.   On: 07/27/2023 13:54   Assessment & Plan:    1. Low urinary tract symptoms Her current voiding symptoms have not significantly changed with antibiotic therapy and most likely are related to bladder overactivity. PVR today 20 mL. Trial Gemtesa 75 mg daily- samples given.  Follow appointment 1 month for symptom reassessment.   2. Bacteriuria  Her voiding symptoms have not significantly changed with antibiotic therapy and this may represent asymptomatic  Urinalysis today is clear, and she is presently asymptomatic.  Repeat urine culture ordered.   I have reviewed the above documentation for accuracy and completeness, and I agree with the above.   Riki Altes, MD  [facility] [address] [place], [place] [address] [phone]

## 2023-09-14 ENCOUNTER — Encounter: Payer: Self-pay | Admitting: Urology

## 2023-09-18 LAB — CULTURE, URINE COMPREHENSIVE

## 2023-09-24 ENCOUNTER — Telehealth: Payer: Self-pay | Admitting: Urology

## 2023-09-24 NOTE — Telephone Encounter (Signed)
 Clifford thanks

## 2023-09-24 NOTE — Telephone Encounter (Signed)
 Patient returned Jessica's call from 09/19/23. I relayed message to patient (Urine culture was positive however would not treat unless she develops worsening symptoms such as fever or burning with urination),  and she states that she has an Upper Respiratory Infection at this time, which may have caused fever, but no burning with urination, or any other urinary symptoms.

## 2023-09-24 NOTE — Progress Notes (Signed)
Left message to return the call

## 2023-10-01 ENCOUNTER — Ambulatory Visit: Payer: BC Managed Care – PPO | Admitting: Urology

## 2023-10-04 DIAGNOSIS — R6889 Other general symptoms and signs: Secondary | ICD-10-CM | POA: Insufficient documentation

## 2023-10-04 DIAGNOSIS — R29898 Other symptoms and signs involving the musculoskeletal system: Secondary | ICD-10-CM | POA: Insufficient documentation

## 2023-10-16 ENCOUNTER — Other Ambulatory Visit (HOSPITAL_COMMUNITY): Payer: Self-pay | Admitting: Gastroenterology

## 2023-10-16 DIAGNOSIS — R140 Abdominal distension (gaseous): Secondary | ICD-10-CM

## 2023-10-18 ENCOUNTER — Ambulatory Visit: Payer: BC Managed Care – PPO | Admitting: Urology

## 2023-10-25 ENCOUNTER — Ambulatory Visit: Payer: BC Managed Care – PPO | Admitting: Urology

## 2023-10-25 ENCOUNTER — Encounter: Payer: Self-pay | Admitting: Urology

## 2023-10-25 VITALS — BP 123/76 | HR 92 | Ht 67.0 in | Wt 208.0 lb

## 2023-10-25 DIAGNOSIS — N390 Urinary tract infection, site not specified: Secondary | ICD-10-CM | POA: Diagnosis not present

## 2023-10-25 DIAGNOSIS — R399 Unspecified symptoms and signs involving the genitourinary system: Secondary | ICD-10-CM | POA: Diagnosis not present

## 2023-10-25 DIAGNOSIS — N200 Calculus of kidney: Secondary | ICD-10-CM

## 2023-10-25 LAB — URINALYSIS, COMPLETE
Bilirubin, UA: NEGATIVE
Glucose, UA: NEGATIVE
Ketones, UA: NEGATIVE
Leukocytes,UA: NEGATIVE
Nitrite, UA: NEGATIVE
Protein,UA: NEGATIVE
RBC, UA: NEGATIVE
Specific Gravity, UA: 1.015 (ref 1.005–1.030)
Urobilinogen, Ur: 0.2 mg/dL (ref 0.2–1.0)
pH, UA: 5 (ref 5.0–7.5)

## 2023-10-25 LAB — MICROSCOPIC EXAMINATION

## 2023-10-25 LAB — BLADDER SCAN AMB NON-IMAGING

## 2023-10-25 NOTE — Progress Notes (Signed)
 I, Maysun Jamey Mccallum, acting as a scribe for Geraline Knapp, MD., have documented all relevant documentation on the behalf of Geraline Knapp, MD, as directed by Geraline Knapp, MD while in the presence of Geraline Knapp, MD.  10/25/2023 2:39 PM   Meagan Allen 1955-07-20 [id number]  Referring provider: Eartha Gold, MD [address] [place],  [place] [address]  Chief Complaint  Patient presents with   Follow-up    HPI: Meagan Allen is a 68 y.o. female presents for a 1 month follow-up.   Initially seen 09/12/2023 for recurrent bacteria. She was also having chronic lower urinary tract symptoms, which did not improve after antibiotic therapy. We discussed the possibility of asymptomatic bacteria and bladder overactivity. She was given a trial of Gemtesa, which she has completed, and while taking states she had resolution of her nocturia. Her symptoms at present are currently mild.  She has had a recent episode of severe right inguinal pain that radiated to the right flank. A CT in early January did show bilateral non-obstructing renal calculi.  She is presently asymptomatic.    PMH: Past Medical History:  Diagnosis Date   Anemia    no recent iron transfusion per pt on 05-16-2021   Anxiety    Arthritis    osteo, inflammatory arthritis and rheumatoid arthritis   Cancer (HCC)    thyroid cancer   CHF (congestive heart failure) (HCC)    diagnosed feb 2019   Deafness in right ear    Depression    Dysphagia 07/19/2020   occ   Dyspnea    on exertion on rare occasions   Enlarged liver    Expressive aphasia 03/15/2020   resolved per pt on 05-16-2021   Fibromyalgia    Gastroesophageal reflux disease with esophagitis without hemorrhage 07/19/2020   GERD (gastroesophageal reflux disease)    Headache disorder 03/15/2020   Heart murmur    High uric acid in 24 hour urine specimen 09/24/2019   History of colon polyps    History of corticosteroid therapy 07/13/2020    History of COVID-19 03/2021   mild symptoms x 5 days all symptoms resolved   History of thyroid cancer 2007;  2009   dx papillary thyroid cancer w/ mets to cervical lymph nodes   HOH (hard of hearing)    left ear   HTN (hypertension) 03/25/2012   Hypercalcemia 07/13/2020   Hyperlipidemia    Hypertension    Hypothyroidism 07/13/2020   IBS (irritable bowel syndrome)    constipation issues   Iron deficiency anemia 07/13/2020   Lesion of liver 07/19/2020   Meningioma (HCC) 03/29/2021   8 mm left frontal lobe unchanged suspected 3 mm left ant temporal lobe memingioma unchanged in size per 03-29-2021 brain mri   Migraine    Mood changes    Morbid obesity (HCC) 07/13/2020   Nonischemic cardiomyopathy [facility]) cardiologist-  dr Doy Gene   per cardiac cath 08-28-2017  moderate LV dysfunction w/ diffuse hypocontractility ,  ef 35-40%   Optic nerve and pathway injury, left, initial encounter    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, per opthamologist black spot middle of left eye   PONV (postoperative nausea and vomiting)    last colonoscopy propfol bp dropped and vomiting   Post-surgical hypothyroidism    Primary localized osteoarthrosis of ankle and foot 02/13/2020   and hands   Rectal bleeding 05/02/2021   Rosacea    Thyroid cancer (HCC) 06/25/2006  surgery and radioactive iodine done   Type 2 diabetes mellitus (HCC)    followed by pcp   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HCC)    Wears glasses    Wears hearing aid in both ears    left ear hearing aid, microphone in right ear    Surgical History: Past Surgical History:  Procedure Laterality Date   CARDIOVASCULAR STRESS TEST  08-20-2017   dr Gordan Latina   High risk nuclear study w/ large irreverisible defect in the basal and mid inferoseptal, inferior, inferolateral walls and apical septal, inferior walls with small peri infarct ischemia in the apical anterior & lateral walls (consistant w/ prior MI )/  no ST segment deviation noted /   nuclear stress ef 36%/  recommended cardiac cath   CARPOMETACARPAL [facility]) FUSION OF THUMB Bilateral 2003   COLONOSCOPY WITH PROPOFOL N/A 05/06/2013   Procedure: COLONOSCOPY WITH PROPOFOL;  Surgeon: Garrett Kallman, MD;  Location: [facility] ENDOS[facility];  Service: Endoscopy;  Laterality: N/A;   D & C HYSTEROSCOPY /  RESECTION POLYP/ ROLLER BALL ABLATION  02-26-2004   dr Nolon Baxter  [facility]   DILATION AND CURETTAGE OF UTERUS  1984   EXCISION MORTON'S NEUROMA Left 1996   giant cell tumor  2011   Left ankle 2011   JOINT REPLACEMENT Left 05/2020   2 [facility] [place]   KNEE ARTHROSCOPY  08-16-2010  dr Ronda Cocks  [facility];  05/ 2012   right x2 left x1   LEFT HEART CATH AND CORONARY ANGIOGRAPHY N/A 08/28/2017   Procedure: LEFT HEART CATH AND CORONARY ANGIOGRAPHY;  Surgeon: Millicent Ally, MD;  Location: [facility] INVASIVE CV LAB;  Service: Cardiovascular;  Laterality: N/A;   large normal coronary arteries in a dominant RCA system;  moderate LV systoic dysfunction w/ diffuse hypocontractility (compatible w/ nonischemic cardiomyopathy), LV end diastoilc pressure normal, LVEF 35-45% by visual estimate   Left knee replaced   05/2010   NASAL LACRIMAL DUCT SURGERY  06/14/2009   REDO RIGHT MODIFIED RADICAL NECK DISSECTION  08-29-2007    [facility]   SHOULDER ARTHROSCOPY WITH ROTATOR CUFF REPAIR AND SUBACROMIAL DECOMPRESSION Left 10/17/2017   Procedure: Left shoulder mini open rotator cuff repair, subacromial decompression;  Surgeon: Orvan Blanch, MD;  Location: [facility] ORS;  Service: Orthopedics;  Laterality: Left;  Interscalene Block   TOTAL KNEE ARTHROPLASTY Right 06/03/2018   Procedure: RIGHT TOTAL KNEE ARTHROPLASTY;  Surgeon: Christie Cox, MD;  Location: [facility] OR[facility];  Service: Orthopedics;  Laterality: Right;   TOTAL THYROIDECTOMY  2007  -- [facility]   w/ dissection lymph nodes   TRANSANAL HEMORRHOIDAL DEARTERIALIZATION N/A 05/20/2021   Procedure: TRANSANAL HEMORRHOIDAL DEARTERIALIZATION;  Surgeon: Joyce Nixon, MD;  Location: [facility];  Service: General;  Laterality: N/A;   TRANSTHORACIC ECHOCARDIOGRAM  08-20-2017  dr Gordan Latina   ef 25-30%, severe diffuse hypokinesis with no identifiable regional variations, but with profound dyssynchrony, due to arrhythmia insuffient to evaluate LV diastolic dysfunction/  trivial MR/  mild LAE/ Ventricular septum motion abnormal funtion,dyssynergy, & paradox   WRIST SURGERY Right 2001    Home Medications:  Allergies as of 10/25/2023       Reactions   Hydroxychloroquine Other (See Comments)   blisters in mouth   Sulfa Antibiotics Hives, Other (See Comments)   Ace Inhibitors Other (See Comments)   fell out   Canagliflozin Other (See Comments)   Yeast issues.   Empagliflozin Other (See Comments)   Yeast issues.   Exenatide Other (See Comments)   Yeast issues.   Gabapentin  Nausea And Vomiting   Lisinopril Other (See Comments)   Pt is unsure of exact reaction   Methotrexate Other (See Comments)   Elevated LFT   Pravastatin Other (See Comments)   Body pain.   Propofol Nausea And Vomiting, Other (See Comments)   Blood pressure bottomed out/syncope        Medication List        Accurate as of October 25, 2023  2:39 PM. If you have any questions, ask your nurse or doctor.          STOP taking these medications    triamcinolone 0.025 % ointment Commonly known as: KENALOG Stopped by: Geraline Knapp       TAKE these medications    allopurinol 300 MG tablet Commonly known as: ZYLOPRIM Take 33 mg by mouth daily.   amoxicillin 500 MG capsule Commonly known as: AMOXIL Take 500 mg by mouth as needed (4 capsules prior to dental procedures).   aspirin EC 81 MG tablet Take 81 mg by mouth daily.   aspirin-acetaminophen-caffeine 250-250-65 MG tablet Commonly known as: EXCEDRIN MIGRAINE Take 1 tablet by mouth every 6 (six) hours as needed for headache.   buPROPion 150 MG 24 hr tablet Commonly known as: WELLBUTRIN XL Take 450 mg by mouth every  morning.   cetirizine 10 MG tablet Commonly known as: ZYRTEC Take 10 mg by mouth daily.   cinacalcet 30 MG tablet Commonly known as: SENSIPAR Take 30 mg by mouth 3 (three) times a week. On hold Mon, Wed, Fri   clindamycin 1 % external solution Commonly known as: CLEOCIN T Apply 1 application  topically 2 (two) times daily as needed (rash).   clonazePAM 0.5 MG tablet Commonly known as: KLONOPIN Take 0.25 mg by mouth at bedtime.   Cosamin ASU Advanced Formula Caps Take 2 tablets by mouth daily. Unknown strength   doxycycline 100 MG capsule Commonly known as: VIBRAMYCIN Take 100 mg by mouth as directed.   Entresto 49-51 MG Generic drug: sacubitril-valsartan TAKE 1 TABLET BY MOUTH TWICE A DAY   furosemide 20 MG tablet Commonly known as: LASIX Take 1 tablet (20 mg total) by mouth daily.   Gemtesa 75 MG Tabs Generic drug: Vibegron Take 1 tablet (75 mg total) by mouth daily.   glipiZIDE 5 MG 24 hr tablet Commonly known as: GLUCOTROL XL Take 5 mg by mouth daily.   Humira (2 Pen) 40 MG/0.4ML pen Generic drug: adalimumab Inject 40 mg into the skin every 14 (fourteen) days.   levothyroxine 112 MCG tablet Commonly known as: SYNTHROID Take 100 mcg by mouth daily.   magnesium oxide 400 MG tablet Commonly known as: MAG-OX Take 1 tablet (400 mg total) by mouth 2 (two) times daily.   mesalamine 1.2 g EC tablet Commonly known as: LIALDA Take 1.2 g by mouth at bedtime.   metFORMIN 500 MG 24 hr tablet Commonly known as: GLUCOPHAGE-XR Take 500-1,000 mg by mouth See admin instructions. Take 2 tablets (1,000 mg) in the morning and 1 tablet (500 mg) at bedtime   metoprolol succinate 50 MG 24 hr tablet Commonly known as: TOPROL-XL Take 1 tablet (50 mg total) by mouth daily. Take with or immediately following a meal.   mometasone 0.1 % lotion Commonly known as: ELOCON Apply 1 application  topically 2 (two) times daily as needed (dry skin).   Mounjaro 2.5 MG/0.5ML  Pen Generic drug: tirzepatide Inject into the skin.   nystatin powder Commonly known as: MYCOSTATIN/NYSTOP Apply 1 application topically  as needed (yeast).   pantoprazole 40 MG tablet Commonly known as: PROTONIX Take 40 mg by mouth daily.   polyethylene glycol powder 17 GM/SCOOP powder Commonly known as: GLYCOLAX/MIRALAX Take 17 g by mouth daily as needed for mild constipation or moderate constipation (for constipation.).   rosuvastatin 5 MG tablet Commonly known as: CRESTOR Take 5 mg by mouth 3 (three) times a week. Mon, Wed, Fri   Trulance 3 MG Tabs Generic drug: Plecanatide Take 1 tablet by mouth daily as needed (IBS).   Vitamin D 50 MCG (2000 UT) Caps Take 1 capsule by mouth daily.   zoledronic acid 5 MG/100ML Soln injection Commonly known as: RECLAST Inject 5 mg into the vein once. Q year        Allergies:  Allergies  Allergen Reactions   Hydroxychloroquine Other (See Comments)    blisters in mouth   Sulfa Antibiotics Hives and Other (See Comments)   Ace Inhibitors Other (See Comments)    fell out   Canagliflozin Other (See Comments)    Yeast issues.   Empagliflozin Other (See Comments)    Yeast issues.   Exenatide Other (See Comments)    Yeast issues.   Gabapentin Nausea And Vomiting   Lisinopril Other (See Comments)    Pt is unsure of exact reaction   Methotrexate Other (See Comments)    Elevated LFT    Pravastatin Other (See Comments)    Body pain.   Propofol Nausea And Vomiting and Other (See Comments)    Blood pressure bottomed out/syncope    Family History: Family History  Problem Relation Age of Onset   Heart disease Father        MI died age 33   Hypertension Father    Diabetes Father    Arthritis Mother        rheumatoid athritis   Stroke Mother    Depression Mother    Gout Brother    Cancer Brother        thyroid CA   Gout Brother    Gout Brother    Heart disease Sister        51 months old   Asthma Son    Breast cancer Neg  Hx     Social History:  reports that she quit smoking about 43 years ago. Her smoking use included cigarettes. She started smoking about 53 years ago. She has a 30 pack-year smoking history. She has been exposed to tobacco smoke. She has never used smokeless tobacco. She reports current alcohol use. She reports that she does not use drugs.   Physical Exam: BP 123/76   Pulse 92   Ht 5\' 7"  (1.702 m)   Wt 208 lb (94.3 kg)   BMI 32.58 kg/m   Constitutional:  Alert and oriented, No acute distress. HEENT:  AT Respiratory: Normal respiratory effort, no increased work of breathing. Psychiatric: Normal mood and affect.  Urinalysis Dipstick/microscopy negative    Pertinent Imaging: CT was personally reviewed and interpreted.   CT  EXAM: CT ABDOMEN AND PELVIS WITHOUT CONTRAST   TECHNIQUE: Multidetector CT imaging of the abdomen and pelvis was performed following the standard protocol without IV contrast.   RADIATION DOSE REDUCTION: This exam was performed according to the departmental dose-optimization program which includes automated exposure control, adjustment of the mA and/or kV according to patient size and/or use of iterative reconstruction technique.   COMPARISON:  06/28/2023 in 11/17/2021   FINDINGS: Lower chest: No acute findings.   Hepatobiliary: 2.2 cm  low-attenuation lesion in the left hepatic lobe is stable since previous studies, consistent with benign etiology. No other liver lesions identified on this unenhanced exam. Gallbladder is unremarkable. No evidence of biliary ductal dilatation.   Pancreas: No mass or inflammatory process visualized on this unenhanced exam.   Spleen:  Within normal limits in size.   Adrenals/Urinary tract: Several tiny less than 5 mm calculi again seen in both kidneys. No evidence of ureteral calculi or hydronephrosis. Unremarkable unopacified urinary bladder.   Stomach/Bowel: No evidence of obstruction, inflammatory  process, or abnormal fluid collections. Normal appendix visualized. Mild sigmoid diverticulosis noted, without signs of diverticulitis.   Vascular/Lymphatic: No pathologically enlarged lymph nodes identified. No evidence of abdominal aortic aneurysm.   Reproductive: A few small uterine fibroids are again seen measuring 2-3 cm in size. Adnexal regions are unremarkable.   Other:  None.   Musculoskeletal:  No suspicious bone lesions identified.   IMPRESSION: Tiny bilateral renal calculi. No evidence of ureteral calculi, hydronephrosis, or other acute findings.   Mild sigmoid diverticulosis, without radiographic evidence of diverticulitis.   Small uterine fibroids.     Electronically Signed   By: Marlyce Sine M.D.   On: 07/27/2023 13:54  Assessment & Plan:    1. Lower Urinary Tract Symptoms Mild improvement of symptoms on Gemtesa; symptoms are not bothersome off medication. Monitor symptoms.  2. Bilateral Nephrolithiasis Recent brief episode of right inguinal pain radiating to the right flank. If pain reoccurs and is persistent, consider follow-up CT for possible ureteral calculus.  3. Recurrent Bacteriuria Urinalysis today is clear.  [facility] [address] [place], [place] [address] [phone]

## 2023-10-26 ENCOUNTER — Ambulatory Visit (HOSPITAL_COMMUNITY)
Admission: RE | Admit: 2023-10-26 | Discharge: 2023-10-26 | Disposition: A | Discharge status: Home / Self Care | Source: Ambulatory Visit | Attending: Gastroenterology | Admitting: Gastroenterology

## 2023-10-26 DIAGNOSIS — R140 Abdominal distension (gaseous): Secondary | ICD-10-CM | POA: Diagnosis present

## 2023-10-26 MED ORDER — TECHNETIUM TC 99M SULFUR COLLOID FILTERED
2.1400 | Freq: Once | INTRAVENOUS | Status: AC | PRN
  Administered 2023-10-26: 2.14 via INTRADERMAL

## 2023-10-27 ENCOUNTER — Encounter: Payer: Self-pay | Admitting: Urology

## 2023-10-30 LAB — CULTURE, URINE COMPREHENSIVE

## 2023-11-07 ENCOUNTER — Encounter: Payer: Self-pay | Admitting: Cardiology

## 2023-11-07 ENCOUNTER — Ambulatory Visit: Payer: BC Managed Care – PPO | Attending: Cardiology | Admitting: Cardiology

## 2023-11-07 VITALS — BP 140/80 | HR 79 | Ht 67.0 in | Wt 209.0 lb

## 2023-11-07 DIAGNOSIS — I5042 Chronic combined systolic (congestive) and diastolic (congestive) heart failure: Secondary | ICD-10-CM

## 2023-11-07 DIAGNOSIS — I428 Other cardiomyopathies: Secondary | ICD-10-CM

## 2023-11-07 DIAGNOSIS — E1122 Type 2 diabetes mellitus with diabetic chronic kidney disease: Secondary | ICD-10-CM

## 2023-11-07 DIAGNOSIS — I10 Essential (primary) hypertension: Secondary | ICD-10-CM

## 2023-11-07 DIAGNOSIS — N1832 Type 2 diabetes mellitus with diabetic chronic kidney disease: Secondary | ICD-10-CM

## 2023-11-07 NOTE — Progress Notes (Unsigned)
 Cardiology Office Note:    Date:  11/07/2023   ID:  Meagan Allen, DOB [birth year], MRN [id number]  PCP:  Meagan Gold, MD  Cardiologist:  Ralene Burger, MD    Referring MD: Meagan Gold, MD   Chief Complaint  Patient presents with   Results    History of Present Illness:    Meagan Allen is a 68 y.o. female    with past medical history significant for nonischemic cardiomyopathy, HTN initial ejection fraction 30 to 35% but improved with guideline directed medical therapy, cardiac catheterization done a few years ago showed no significant obstructive disease. Additional problem include essential hypertension, diabetes. Recently she started experiencing some headache. She went to her rheumatologist suspicion for left temporal arteritis has been raised. Then she ended up having biopsy of this lesion after she was given high-dose of steroids. While she was having surgery there was significant drop in her heart rate down to 30 and eventually she ended up being transferred to the emergency room in the emergency room her metoprolol  has been completely discontinued and she has been doing better in terms of dizziness since that time. Additional problem include neuropathy in lower extremities which make her unsteady on her feet  Last time I seen her concern was ejection fraction being low normal at 51% as proven by MRI, she also wore monitor which show high burden of PVCs 10% with 3 runs of nonsustained ventricular tachycardia.  After that he seen our EP team and concern was that this probably post steroids  And sedating muscle follow-up she is doing great.  He retired just few weeks ago he is happy looking forward to retirement.  Denies any chest pain tightness squeezing pressure burning chest no palpitation dizziness swelling of lower extremities  Past Medical History:  Diagnosis Date   Anemia    no recent iron transfusion per pt on 05-16-2021   Anxiety    Arthritis    osteo,  inflammatory arthritis and rheumatoid arthritis   Cancer (HCC)    thyroid  cancer   CHF (congestive heart failure) (HCC)    diagnosed feb 2019   Deafness in right ear    Depression    Dysphagia 07/19/2020   occ   Dyspnea    on exertion on rare occasions   Enlarged liver    Expressive aphasia 03/15/2020   resolved per pt on 05-16-2021   Fibromyalgia    Gastroesophageal reflux disease with esophagitis without hemorrhage 07/19/2020   GERD (gastroesophageal reflux disease)    Headache disorder 03/15/2020   Heart murmur    High uric acid in 24 hour urine specimen 09/24/2019   History of colon polyps    History of corticosteroid therapy 07/13/2020   History of COVID-19 03/2021   mild symptoms x 5 days all symptoms resolved   History of thyroid  cancer 2007;  2009   dx papillary thyroid  cancer w/ mets to cervical lymph nodes   HOH (hard of hearing)    left ear   HTN (hypertension) 03/25/2012   Hypercalcemia 07/13/2020   Hyperlipidemia    Hypertension    Hypothyroidism 07/13/2020   IBS (irritable bowel syndrome)    constipation issues   Iron deficiency anemia 07/13/2020   Lesion of liver 07/19/2020   Meningioma (HCC) 03/29/2021   8 mm left frontal lobe unchanged suspected 3 mm left ant temporal lobe memingioma unchanged in size per 03-29-2021 brain mri   Migraine    Mood changes    Morbid  obesity (HCC) 07/13/2020   Nonischemic cardiomyopathy [facility]) cardiologist-  dr Doy Gene   per cardiac cath 08-28-2017  moderate LV dysfunction w/ diffuse hypocontractility ,  ef 35-40%   Optic nerve and pathway injury, left, initial encounter    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, per opthamologist black spot middle of left eye   PONV (postoperative nausea and vomiting)    last colonoscopy propfol bp dropped and vomiting   Post-surgical hypothyroidism    Primary localized osteoarthrosis of ankle and foot 02/13/2020   and hands   Rectal bleeding 05/02/2021   Rosacea    Thyroid  cancer  (HCC) 06/25/2006   surgery and radioactive iodine done   Type 2 diabetes mellitus (HCC)    followed by pcp   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HCC)    Wears glasses    Wears hearing aid in both ears    left ear hearing aid, microphone in right ear    Past Surgical History:  Procedure Laterality Date   CARDIOVASCULAR STRESS TEST  08-20-2017   dr Gordan Latina   High risk nuclear study w/ large irreverisible defect in the basal and mid inferoseptal, inferior, inferolateral walls and apical septal, inferior walls with small peri infarct ischemia in the apical anterior & lateral walls (consistant w/ prior MI )/  no ST segment deviation noted /  nuclear stress ef 36%/  recommended cardiac cath   CARPOMETACARPAL [facility]) FUSION OF THUMB Bilateral 2003   COLONOSCOPY WITH PROPOFOL  N/A 05/06/2013   Procedure: COLONOSCOPY WITH PROPOFOL ;  Surgeon: Garrett Kallman, MD;  Location: [facility];  Service: Endoscopy;  Laterality: N/A;   D & C HYSTEROSCOPY /  RESECTION POLYP/ ROLLER BALL ABLATION  02-26-2004   dr Nolon Baxter  [facility]   DILATION AND CURETTAGE OF UTERUS  1984   EXCISION MORTON'S NEUROMA Left 1996   giant cell tumor  2011   Left ankle 2011   JOINT REPLACEMENT Left 05/2020   2 [facility] [place]   KNEE ARTHROSCOPY  08-16-2010  dr Ronda Cocks  [facility];  05/ 2012   right x2 left x1   LEFT HEART CATH AND CORONARY ANGIOGRAPHY N/A 08/28/2017   Procedure: LEFT HEART CATH AND CORONARY ANGIOGRAPHY;  Surgeon: Millicent Ally, MD;  Location: [facility] INVASIVE CV LAB;  Service: Cardiovascular;  Laterality: N/A;   large normal coronary arteries in a dominant RCA system;  moderate LV systoic dysfunction w/ diffuse hypocontractility (compatible w/ nonischemic cardiomyopathy), LV end diastoilc pressure normal, LVEF 35-45% by visual estimate   Left knee replaced   05/2010   NASAL LACRIMAL DUCT SURGERY  06/14/2009   REDO RIGHT MODIFIED RADICAL NECK DISSECTION  08-29-2007    [facility]   SHOULDER ARTHROSCOPY WITH ROTATOR CUFF REPAIR  AND SUBACROMIAL DECOMPRESSION Left 10/17/2017   Procedure: Left shoulder mini open rotator cuff repair, subacromial decompression;  Surgeon: Orvan Blanch, MD;  Location: [facility]S;  Service: Orthopedics;  Laterality: Left;  Interscalene Block   TOTAL KNEE ARTHROPLASTY Right 06/03/2018   Procedure: RIGHT TOTAL KNEE ARTHROPLASTY;  Surgeon: Christie Cox, MD;  Location: [facility];  Service: Orthopedics;  Laterality: Right;   TOTAL THYROIDECTOMY  2007  -- [facility]   w/ dissection lymph nodes   TRANSANAL HEMORRHOIDAL DEARTERIALIZATION N/A 05/20/2021   Procedure: TRANSANAL HEMORRHOIDAL DEARTERIALIZATION;  Surgeon: Joyce Nixon, MD;  Location: [facility];  Service: General;  Laterality: N/A;   TRANSTHORACIC ECHOCARDIOGRAM  08-20-2017  dr Gordan Latina   ef 25-30%, severe diffuse hypokinesis with no identifiable regional variations, but with profound dyssynchrony,  due to arrhythmia insuffient to evaluate LV diastolic dysfunction/  trivial MR/  mild LAE/ Ventricular septum motion abnormal funtion,dyssynergy, & paradox   WRIST SURGERY Right 2001    Current Medications: Current Meds  Medication Sig   allopurinol (ZYLOPRIM) 300 MG tablet Take 33 mg by mouth daily.   amoxicillin (AMOXIL) 500 MG capsule Take 500 mg by mouth as needed (4 capsules prior to dental procedures).   aspirin  EC 81 MG tablet Take 81 mg by mouth daily.   aspirin -acetaminophen -caffeine (EXCEDRIN MIGRAINE) 250-250-65 MG tablet Take 1 tablet by mouth every 6 (six) hours as needed for headache.   buPROPion  (WELLBUTRIN  XL) 150 MG 24 hr tablet Take 450 mg by mouth every morning.    cetirizine (ZYRTEC) 10 MG tablet Take 10 mg by mouth daily.   Cholecalciferol (VITAMIN D) 50 MCG (2000 UT) CAPS Take 1 capsule by mouth daily.   cinacalcet (SENSIPAR) 30 MG tablet Take 30 mg by mouth 3 (three) times a week. On hold Mon, Wed, Fri   clindamycin (CLEOCIN T) 1 % external solution Apply 1 application  topically 2 (two) times daily as needed  (rash).   clonazePAM  (KLONOPIN ) 0.5 MG tablet Take 0.25 mg by mouth at bedtime.   doxycycline  (VIBRAMYCIN ) 100 MG capsule Take 100 mg by mouth as directed.   ENTRESTO  49-51 MG TAKE 1 TABLET BY MOUTH TWICE A DAY   furosemide  (LASIX ) 20 MG tablet Take 1 tablet (20 mg total) by mouth daily.   glipiZIDE (GLUCOTROL XL) 5 MG 24 hr tablet Take 5 mg by mouth daily.   HUMIRA PEN 40 MG/0.4ML PNKT Inject 40 mg into the skin every 14 (fourteen) days.   levothyroxine  (SYNTHROID ) 112 MCG tablet Take 100 mcg by mouth daily.   liothyronine (CYTOMEL) 5 MCG tablet Take 5 mcg by mouth daily.   magnesium  oxide (MAG-OX) 400 MG tablet Take 1 tablet (400 mg total) by mouth 2 (two) times daily.   mesalamine  (LIALDA ) 1.2 g EC tablet Take 1.2 g by mouth at bedtime.   metFORMIN  (GLUCOPHAGE -XR) 500 MG 24 hr tablet Take 500-1,000 mg by mouth See admin instructions. Take 2 tablets (1,000 mg) in the morning and 1 tablet (500 mg) at bedtime   metoprolol  succinate (TOPROL -XL) 50 MG 24 hr tablet Take 1 tablet (50 mg total) by mouth daily. Take with or immediately following a meal.   Misc Natural Products (COSAMIN ASU ADVANCED FORMULA) CAPS Take 2 tablets by mouth daily. Unknown strength   mometasone (ELOCON) 0.1 % lotion Apply 1 application  topically 2 (two) times daily as needed (dry skin).   MOUNJARO 2.5 MG/0.5ML Pen Inject 2.5 mg into the skin once a week.   nystatin (MYCOSTATIN/NYSTOP) powder Apply 1 application topically as needed (yeast).   pantoprazole  (PROTONIX ) 40 MG tablet Take 40 mg by mouth daily.   polyethylene glycol powder (GLYCOLAX /MIRALAX ) powder Take 17 g by mouth daily as needed for mild constipation or moderate constipation (for constipation.).   rosuvastatin (CRESTOR) 5 MG tablet Take 5 mg by mouth 3 (three) times a week. Mon, Wed, Fri   TRULANCE 3 MG TABS Take 1 tablet by mouth daily as needed (IBS).   Vibegron  (GEMTESA ) 75 MG TABS Take 1 tablet (75 mg total) by mouth daily.   zoledronic acid (RECLAST) 5  MG/100ML SOLN injection Inject 5 mg into the vein once. Q year     Allergies:   Hydroxychloroquine, Sulfa antibiotics, Ace inhibitors, Canagliflozin, Empagliflozin, Exenatide, Gabapentin , Lisinopril, Methotrexate , Pravastatin, and Propofol    Social  History   Socioeconomic History   Marital status: Married    Spouse name: Royston Cornea   Number of children: 2   Years of education: 12   Highest education level: Not on file  Occupational History   Occupation: Production manager for [employer]    Employer: [employer]  Tobacco Use   Smoking status: Former    Current packs/day: 0.00    Average packs/day: 3.0 packs/day for 10.0 years (30.0 ttl pk-yrs)    Types: Cigarettes    Start date: 07/17/1970    Quit date: 07/17/1980    Years since quitting: 43.3    Passive exposure: Past   Smokeless tobacco: Never  Vaping Use   Vaping status: Never Used  Substance and Sexual Activity   Alcohol use: Yes    Comment: very occ   Drug use: No   Sexual activity: Yes    Birth control/protection: Post-menopausal  Other Topics Concern   Not on file  Social History Narrative   Shonnie was born in [place], [place]. She moved to [place]  in 1978 when her family moved to this state. Pressley currently lives in [place] with her husband of 32 years. They have 2 adult children and 1 grandson. 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 She enjoys gardening.   Social Drivers of Corporate investment banker Strain: Not on file  Food Insecurity: Low Risk  (09/07/2023)   Received from [facility]   Hunger Vital Sign    Worried About Running Out of Food in the Last Year: Never true    Ran Out of Food in the Last Year: Never true  Transportation Needs: No Transportation Needs (09/07/2023)   Received from [facility]    In the past 12 months, has lack of reliable transportation kept you from medical appointments, meetings, work or from getting things needed for daily living? : No   Physical Activity: Not on file  Stress: Not on file  Social Connections: Not on file     Family History: The patient's family history includes Arthritis in her mother; Asthma in her son; Cancer in her brother; Depression in her mother; Diabetes in her father; Gout in her brother, brother, and brother; Heart disease in her father and sister; Hypertension in her father; Stroke in her mother. There is no history of Breast cancer. ROS:   Please see the history of present illness.    All 14 point review of systems negative except as described per history of present illness  EKGs/Labs/Other Studies Reviewed:    EKG Interpretation Date/Time:  Wednesday November 07 2023 11:04:42 EDT Ventricular Rate:  78 PR Interval:  134 QRS Duration:  146 QT Interval:  424 QTC Calculation: 483 R Axis:   -51  Text Interpretation: Normal sinus rhythm Left axis deviation Left ventricular hypertrophy with QRS widening ( R in aVL , Cornell product ) When compared with ECG of 26-May-2022 22:55, Vent. rate has increased BY  27 BPM Left bundle branch block is no longer Present Confirmed by Ralene Burger [phone]) on 11/07/2023 11:10:19 AM    Recent Labs: 11/14/2022: BUN 19; Creatinine, Ser 1.32; Magnesium  1.8; Potassium 4.2; Sodium 143  Recent Lipid Panel    Component Value Date/Time   CHOL 167 04/03/2023 0848   TRIG 204 (H) 04/03/2023 0848   HDL 52 04/03/2023 0848   CHOLHDL 3.2 04/03/2023 0848   LDLCALC 81 04/03/2023 0848    Physical Exam:    VS:  BP Aaron Aas)  140/80 (BP Location: Right Arm, Patient Position: Sitting)   Pulse 79   Ht 5\' 7"  (1.702 m)   Wt 209 lb (94.8 kg)   SpO2 95%   BMI 32.73 kg/m     Wt Readings from Last 3 Encounters:  11/07/23 209 lb (94.8 kg)  10/25/23 208 lb (94.3 kg)  09/12/23 212 lb (96.2 kg)     GEN:  Well nourished, well developed in no acute distress HEENT: Normal NECK: No JVD; No carotid bruits LYMPHATICS: No lymphadenopathy CARDIAC: RRR, no murmurs, no rubs, no  gallops RESPIRATORY:  Clear to auscultation without rales, wheezing or rhonchi  ABDOMEN: Soft, non-tender, non-distended MUSCULOSKELETAL:  No edema; No deformity  SKIN: Warm and dry LOWER EXTREMITIES: no swelling NEUROLOGIC:  Alert and oriented x 3 PSYCHIATRIC:  Normal affect   ASSESSMENT:    1. Primary hypertension   2. Chronic combined systolic and diastolic congestive heart failure (HCC)   3. Nonischemic cardiomyopathy (HCC)   4. Type 2 diabetes mellitus with stage 3b chronic kidney disease, unspecified whether long term insulin  use (HCC)    PLAN:    In order of problems listed above:  Essential hypertension blood pressure well-controlled in the office only slightly elevated continue monitoring. Chronic combined systolic and diastolic congestive heart failure, down to repeat echocardiogram which I will schedule her to have. Recently she had CT of her chest done and descriptions cardiovagal described first that there is no pericardial effusion that there is a trace pericardial effusion.  Will check echocardiogram to verify data. Type 2 diabetes was poorly controlled and she was switched to Mounjaro hopefully that will help significantly. Dyslipidemia I did review K PN LDL 81 HDL 52 continue present management   Medication Adjustments/Labs and Tests Ordered: Current medicines are reviewed at length with the patient today.  Concerns regarding medicines are outlined above.  Orders Placed This Encounter  Procedures   EKG 12-Lead   Medication changes: No orders of the defined types were placed in this encounter.   Signed, Manfred Seed, MD, [facility] 11/07/2023 11:29 AM    [facility]

## 2023-11-07 NOTE — Patient Instructions (Addendum)

## 2023-12-03 ENCOUNTER — Ambulatory Visit (HOSPITAL_BASED_OUTPATIENT_CLINIC_OR_DEPARTMENT_OTHER)
Admission: RE | Admit: 2023-12-03 | Discharge: 2023-12-03 | Disposition: A | Discharge status: Home / Self Care | Source: Ambulatory Visit | Attending: Cardiology | Admitting: Cardiology

## 2023-12-03 DIAGNOSIS — I428 Other cardiomyopathies: Secondary | ICD-10-CM | POA: Insufficient documentation

## 2023-12-04 ENCOUNTER — Ambulatory Visit: Payer: Self-pay | Admitting: Cardiology

## 2023-12-04 LAB — ECHOCARDIOGRAM COMPLETE
AR max vel: 2.03 cm2
AV Area VTI: 2.12 cm2
AV Area mean vel: 1.88 cm2
AV Mean grad: 5 mmHg
AV Peak grad: 8.8 mmHg
Ao pk vel: 1.48 m/s
Area-P 1/2: 6.37 cm2
Calc EF: 51 %
S' Lateral: 3.4 cm
Single Plane A2C EF: 50.4 %
Single Plane A4C EF: 50.3 %

## 2023-12-17 ENCOUNTER — Ambulatory Visit (INDEPENDENT_AMBULATORY_CARE_PROVIDER_SITE_OTHER): Admitting: Podiatry

## 2023-12-17 ENCOUNTER — Encounter: Payer: Self-pay | Admitting: Podiatry

## 2023-12-17 ENCOUNTER — Ambulatory Visit (INDEPENDENT_AMBULATORY_CARE_PROVIDER_SITE_OTHER)

## 2023-12-17 VITALS — Ht 67.0 in | Wt 209.0 lb

## 2023-12-17 DIAGNOSIS — M7752 Other enthesopathy of left foot: Secondary | ICD-10-CM

## 2023-12-17 DIAGNOSIS — M722 Plantar fascial fibromatosis: Secondary | ICD-10-CM

## 2023-12-17 DIAGNOSIS — M7751 Other enthesopathy of right foot: Secondary | ICD-10-CM | POA: Diagnosis not present

## 2023-12-17 DIAGNOSIS — M778 Other enthesopathies, not elsewhere classified: Secondary | ICD-10-CM

## 2023-12-17 NOTE — Patient Instructions (Signed)

## 2023-12-17 NOTE — Progress Notes (Signed)
 Subjective: Chief Complaint  Patient presents with   Foot Pain    RM 12:pain in rt arch for at least 6 months in consecutively /left ankle swelling and pain gives off sharp shooting pains in dorsal foot that also run up back   68 year old female presents the office today with above concerns.  She is getting pain to the right arch more distally along the arch of the foot.  She is also been experiencing pain along the anterior aspect of the left ankle more along the anteromedial aspect.  She does have a history of a giant cell tumor removal many years ago.  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  No specific injury that she reports.  Objective: AAO x3, NAD DP/PT pulses palpable bilaterally, CRT less than 3 seconds There is chronic edema present to the left ankle.  She has tenderness to palpation on the anterior medial aspect of the ankle joint as well as along the tibialis anterior tendon.  There is no erythema or warmth there is no palpable nodule present.  There is no pain or crepitation with ankle joint range of motion. On the arch of the right foot there is tenderness along the medial band of plantar fascia distally.  There is no area pinpoint tenderness.  No significant edema on the right side.  There is no erythema or warmth. No pain with calf compression, swelling, warmth, erythema  Assessment: 68 year old female capsulitis left ankle with chronic swelling and history of giant cell tumor removal; right plantar fasciitis  Plan: -All treatment options discussed with the patient including all alternatives, risks, complications.  -X-rays were obtained and reviewed bilaterally.  Multiple views were obtained.  On the left ankle joint space is maintained.  No acute fracture.  No spurring present.  The right foot calcaneal spurs present.  No evidence of acute fracture. -Given pain to her left  ankle history of joint cell tumor in the similar area ordered an MRI to further evaluate. -On the right side we discussed stretching, icing a regular basis as well as shoes, good support.  I did add metatarsal support to the arch of her sandal.  -Patient encouraged to call the office with any questions, concerns, change in symptoms.    Return for MRI results left ankle.  Charity Conch DPM

## 2023-12-21 ENCOUNTER — Telehealth: Payer: Self-pay

## 2023-12-21 NOTE — Telephone Encounter (Signed)
 Left message on My Chart with Echo results per Dr. Vanetta Shawl note. Routed to PCP.

## 2023-12-23 ENCOUNTER — Ambulatory Visit
Admission: RE | Admit: 2023-12-23 | Discharge: 2023-12-23 | Disposition: A | Discharge status: Home / Self Care | Source: Ambulatory Visit | Attending: Podiatry | Admitting: Podiatry

## 2023-12-23 DIAGNOSIS — M7752 Other enthesopathy of left foot: Secondary | ICD-10-CM

## 2023-12-24 ENCOUNTER — Telehealth: Payer: Self-pay

## 2023-12-24 NOTE — Telephone Encounter (Signed)
 Pt viewed Echo results on My Chart per Dr. Vanetta Shawl note. Routed to PCP.

## 2023-12-27 ENCOUNTER — Ambulatory Visit: Payer: Self-pay | Admitting: Podiatry

## 2024-01-29 ENCOUNTER — Encounter: Payer: Self-pay | Admitting: Radiology

## 2024-01-29 ENCOUNTER — Ambulatory Visit (INDEPENDENT_AMBULATORY_CARE_PROVIDER_SITE_OTHER): Admitting: Radiology

## 2024-01-29 ENCOUNTER — Other Ambulatory Visit (HOSPITAL_COMMUNITY)
Admission: RE | Admit: 2024-01-29 | Discharge: 2024-01-29 | Disposition: A | Discharge status: Home / Self Care | Source: Ambulatory Visit | Attending: Radiology | Admitting: Radiology

## 2024-01-29 VITALS — BP 126/78 | HR 89 | Ht 66.75 in | Wt 208.2 lb

## 2024-01-29 DIAGNOSIS — Z01419 Encounter for gynecological examination (general) (routine) without abnormal findings: Secondary | ICD-10-CM | POA: Insufficient documentation

## 2024-01-29 DIAGNOSIS — Z1331 Encounter for screening for depression: Secondary | ICD-10-CM

## 2024-01-29 NOTE — Progress Notes (Signed)
   Meagan Allen January 02, 1956 [id number]   History: Postmenopausal 68 y.o. presents for annual exam as a new patient. C/o small bump on right labia she noticed recently. No pain, burning or discharge from the area.    Gynecologic History Postmenopausal Last Pap: 2011. Results were: normal Last mammogram: 03/2023. Results were: normal Last colonoscopy: 2022 [id number]  Obstetric History OB History  Gravida Para Term Preterm AB Living  3 2    1   SAB IAB Ectopic Multiple Live Births      1    # Outcome Date GA Lbr Len/2nd Weight Sex Type Anes PTL Lv  3 Gravida           2 Para           1 Para                01/29/2024    9:02 AM 06/12/2016    8:16 AM  Depression screen PHQ 2/9  Decreased Interest 0 0  Down, Depressed, Hopeless 0 0  PHQ - 2 Score 0 0     The following portions of the patient's history were reviewed and updated as appropriate: allergies, current medications, past family history, past medical history, past social history, past surgical history, and problem list.  Review of Systems Pertinent items noted in HPI and remainder of comprehensive ROS otherwise negative.  Past medical history, past surgical history, family history and social history were all reviewed and documented in the EPIC chart.  Exam:  Vitals:   01/29/24 0836  BP: 126/78  Pulse: 89  SpO2: 99%  Weight: 208 lb 3.2 oz (94.4 kg)  Height: 5' 6.75 (1.695 m)   Body mass index is 32.85 kg/m.  General appearance:  Normal Thyroid :  surgically absent Respiratory: effort normal Abdominal  Soft,nontender, without masses, guarding or rebound.  Liver/spleen:  No organomegaly noted  Hernia:  None appreciated  Skin  Inspection:  Grossly normal with an open comedone present on the outer labia. Breasts: Declines, normal mammo. Genitourinary   Inguinal/mons:  Normal without inguinal adenopathy  External genitalia:  Normal appearing vulva with no masses, tenderness, or  lesions  BUS/Urethra/Skene's glands:  Normal  Vagina:  Normal appearing with normal color and discharge, no lesions. Atrophy: mild   Cervix:  Normal appearing without discharge or lesions  Uterus:  Normal in size, shape and contour.  Midline and mobile, nontender  Adnexa/parametria:     Rt: Normal in size, without masses or tenderness.   Lt: Normal in size, without masses or tenderness.  Anus and perineum: Normal    Darice Hoit, CMA present for exam  Assessment/Plan:   1. Well woman exam with routine gynecological exam (Primary) - Reassured area of concern was a comedone - Cytology - PAP( [place])    Return in 1 year for annual or sooner prn.  Seferino Oscar B WHNP-BC, 9:09 AM 01/29/2024

## 2024-01-31 ENCOUNTER — Ambulatory Visit: Payer: Self-pay | Admitting: Radiology

## 2024-01-31 LAB — CYTOLOGY - PAP
Adequacy: ABSENT
Diagnosis: NEGATIVE

## 2024-02-28 ENCOUNTER — Telehealth: Payer: Self-pay | Admitting: Cardiology

## 2024-02-28 ENCOUNTER — Other Ambulatory Visit: Payer: Self-pay | Admitting: Infectious Diseases

## 2024-02-28 ENCOUNTER — Other Ambulatory Visit: Payer: Self-pay

## 2024-02-28 DIAGNOSIS — Z1231 Encounter for screening mammogram for malignant neoplasm of breast: Secondary | ICD-10-CM

## 2024-02-28 MED ORDER — SACUBITRIL-VALSARTAN 49-51 MG PO TABS: 1.0000 | ORAL_TABLET | Freq: Two times a day (BID) | ORAL | 1 refills | Status: DC

## 2024-02-28 NOTE — Telephone Encounter (Signed)
*  STAT* If patient is at the pharmacy, call can be transferred to refill team.   1. Which medications need to be refilled? (please list name of each medication and dose if known) ENTRESTO 49-51 MG    2. Would you like to learn more about the convenience, safety, & potential cost savings by using the [facility]? No   3. Are you open to using the [facility] (Type [facility]. ). No   4. Which pharmacy/location (including street and city if local pharmacy) is medication to be sent to? [id number] [place], [place] - [address]     5. Do they need a 30 day or 90 day supply? 90 day

## 2024-03-27 ENCOUNTER — Other Ambulatory Visit: Payer: Self-pay | Admitting: Family Medicine

## 2024-03-27 DIAGNOSIS — R531 Weakness: Secondary | ICD-10-CM

## 2024-03-27 DIAGNOSIS — R519 Headache, unspecified: Secondary | ICD-10-CM

## 2024-03-28 ENCOUNTER — Ambulatory Visit
Admission: RE | Admit: 2024-03-28 | Discharge: 2024-03-28 | Disposition: A | Discharge status: Home / Self Care | Source: Ambulatory Visit | Attending: Family Medicine | Admitting: Family Medicine

## 2024-03-28 DIAGNOSIS — R519 Headache, unspecified: Secondary | ICD-10-CM

## 2024-03-28 DIAGNOSIS — R531 Weakness: Secondary | ICD-10-CM

## 2024-04-24 ENCOUNTER — Ambulatory Visit

## 2024-05-07 ENCOUNTER — Ambulatory Visit: Attending: Cardiology | Admitting: Cardiology

## 2024-05-07 ENCOUNTER — Encounter: Payer: Self-pay | Admitting: Cardiology

## 2024-05-07 ENCOUNTER — Ambulatory Visit
Admission: RE | Admit: 2024-05-07 | Discharge: 2024-05-07 | Disposition: A | Source: Ambulatory Visit | Attending: Infectious Diseases | Admitting: Infectious Diseases

## 2024-05-07 VITALS — BP 110/76 | HR 92 | Ht 66.75 in | Wt 200.0 lb

## 2024-05-07 DIAGNOSIS — I10 Essential (primary) hypertension: Secondary | ICD-10-CM | POA: Diagnosis not present

## 2024-05-07 DIAGNOSIS — I5042 Chronic combined systolic (congestive) and diastolic (congestive) heart failure: Secondary | ICD-10-CM | POA: Diagnosis not present

## 2024-05-07 DIAGNOSIS — E1122 Type 2 diabetes mellitus with diabetic chronic kidney disease: Secondary | ICD-10-CM | POA: Diagnosis not present

## 2024-05-07 DIAGNOSIS — Z1231 Encounter for screening mammogram for malignant neoplasm of breast: Secondary | ICD-10-CM

## 2024-05-07 DIAGNOSIS — N1832 Chronic kidney disease, stage 3b: Secondary | ICD-10-CM

## 2024-05-07 DIAGNOSIS — I4729 Other ventricular tachycardia: Secondary | ICD-10-CM

## 2024-05-07 NOTE — Patient Instructions (Signed)
Medication Instructions:  Your physician recommends that you continue on your current medications as directed. Please refer to the Current Medication list given to you today.  *If you need a refill on your cardiac medications before your next appointment, please call your pharmacy*   Lab Work: None Ordered If you have labs (blood work) drawn today and your tests are completely normal, you will receive your results only by: MyChart Message (if you have MyChart) OR A paper copy in the mail If you have any lab test that is abnormal or we need to change your treatment, we will call you to review the results.   Testing/Procedures: None Ordered   Follow-Up: At [facility], you and your health needs are our priority.  As part of our continuing mission to provide you with exceptional heart care, we have created designated Provider Care Teams.  These Care Teams include your primary Cardiologist (physician) and Advanced Practice Providers (APPs -  Physician Assistants and Nurse Practitioners) who all work together to provide you with the care you need, when you need it.  We recommend signing up for the patient portal called "MyChart".  Sign up information is provided on this After Visit Summary.  MyChart is used to connect with patients for Virtual Visits (Telemedicine).  Patients are able to view lab/test results, encounter notes, upcoming appointments, etc.  Non-urgent messages can be sent to your provider as well.   To learn more about what you can do with MyChart, go to [email or web address].    Your next appointment:   2 month(s)  The format for your next appointment:   In Person  Provider:   Gypsy Balsam, MD    Other Instructions NA

## 2024-05-07 NOTE — Progress Notes (Signed)
 Cardiology Office Note:    Date:  05/07/2024   ID:  Meagan Allen, DOB 09-25-1955, MRN [id number]  PCP:  [name], MD  Cardiologist:  [name], MD    Referring MD: [name], MD   Chief Complaint  Patient presents with   Follow-up    History of Present Illness:    Meagan Allen is a 68 y.o. female  ith past medical history significant for nonischemic cardiomyopathy, HTN initial ejection fraction 30 to 35% but improved with guideline directed medical therapy, cardiac catheterization done a few years ago showed no significant obstructive disease. Additional problem include essential hypertension, diabetes. Recently she started experiencing some headache. She went to her rheumatologist suspicion for left temporal arteritis has been raised. Then she ended up having biopsy of this lesion after she was given high-dose of steroids. While she was having surgery there was significant drop in her heart rate down to 30 and eventually she ended up being transferred to the emergency room in the emergency room her metoprolol  has been completely discontinued and she has been doing better in terms of dizziness since that time. Additional problem include neuropathy in lower extremities which make her unsteady on her feet  Last time I seen her concern was ejection fraction being low normal at 51% as proven by MRI, she also wore monitor which show high burden of PVCs 10% with 3 runs of nonsustained ventricular tachycardia.  After that he seen our EP team and concern was that this probably post steroids  Comes today to months for follow-up.  Since I have seen her last time she ended up being sick some febrile illness multiple courses of antibiotic, some shortness of breath cough likely got better.  Eventually she end up going to [place] and she got COVID over there gradual recovering still weak tired exhausted but no chronic complaints otherwise she did have some chest pain but those  were sharp lasting only for split-second  Past Medical History:  Diagnosis Date   Anemia    no recent iron transfusion per pt on 05-16-2021   Anxiety    Arthritis    osteo, inflammatory arthritis and rheumatoid arthritis   Cancer (HCC)    thyroid  cancer   CHF (congestive heart failure) (HCC)    diagnosed feb 2019   Deafness in right ear    Depression    Dysphagia 07/19/2020   occ   Dyspnea    on exertion on rare occasions   Enlarged liver    Expressive aphasia 03/15/2020   resolved per pt on 05-16-2021   Fibromyalgia    Gastroesophageal reflux disease with esophagitis without hemorrhage 07/19/2020   GERD (gastroesophageal reflux disease)    Headache disorder 03/15/2020   Heart murmur    High uric acid in 24 hour urine specimen 09/24/2019   History of colon polyps    History of corticosteroid therapy 07/13/2020   History of COVID-19 03/2021   mild symptoms x 5 days all symptoms resolved   History of thyroid  cancer 2007;  2009   dx papillary thyroid  cancer w/ mets to cervical lymph nodes   HOH (hard of hearing)    left ear   HTN (hypertension) 03/25/2012   Hypercalcemia 07/13/2020   Hyperlipidemia    Hypertension    Hypothyroidism 07/13/2020   IBS (irritable bowel syndrome)    constipation issues   Iron deficiency anemia 07/13/2020   Lesion of liver 07/19/2020   Meningioma (HCC) 03/29/2021   8  mm left frontal lobe unchanged suspected 3 mm left ant temporal lobe memingioma unchanged in size per 03-29-2021 brain mri   Migraine    Mood changes    Morbid obesity (HCC) 07/13/2020   Nonischemic cardiomyopathy [facility]) cardiologist-  dr margean   per cardiac cath 08-28-2017  moderate LV dysfunction w/ diffuse hypocontractility ,  ef 35-40%   Optic nerve and pathway injury, left, initial encounter    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, per opthamologist black spot middle of left eye   PONV (postoperative nausea and vomiting)    last colonoscopy propfol bp dropped and  vomiting   Post-surgical hypothyroidism    Primary localized osteoarthrosis of ankle and foot 02/13/2020   and hands   Rectal bleeding 05/02/2021   Rosacea    Thyroid  cancer (HCC) 06/25/2006   surgery and radioactive iodine done   Type 2 diabetes mellitus (HCC)    followed by pcp   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HCC)    Wears glasses    Wears hearing aid in both ears    left ear hearing aid, microphone in right ear    Past Surgical History:  Procedure Laterality Date   CARDIOVASCULAR STRESS TEST  08-20-2017   dr bernie   High risk nuclear study w/ large irreverisible defect in the basal and mid inferoseptal, inferior, inferolateral walls and apical septal, inferior walls with small peri infarct ischemia in the apical anterior & lateral walls (consistant w/ prior MI )/  no ST segment deviation noted /  nuclear stress ef 36%/  recommended cardiac cath   CARPOMETACARPAL [facility]) FUSION OF THUMB Bilateral 2003   COLONOSCOPY WITH PROPOFOL  N/A 05/06/2013   Procedure: COLONOSCOPY WITH PROPOFOL ;  Surgeon: Gladis MARLA Louder, MD;  Location: [facility];  Service: Endoscopy;  Laterality: N/A;   D & C HYSTEROSCOPY /  RESECTION POLYP/ ROLLER BALL ABLATION  02-26-2004   dr janett  [facility]   DILATION AND CURETTAGE OF UTERUS  1984   EXCISION MORTON'S NEUROMA Left 1996   giant cell tumor  2011   Left ankle 2011   JOINT REPLACEMENT Left 05/2020   2 [facility] [place]   KNEE ARTHROSCOPY  08-16-2010  dr vickye  [facility];  05/ 2012   right x2 left x1   LEFT HEART CATH AND CORONARY ANGIOGRAPHY N/A 08/28/2017   Procedure: LEFT HEART CATH AND CORONARY ANGIOGRAPHY;  Surgeon: Burnard Debby LABOR, MD;  Location: [facility];  Service: Cardiovascular;  Laterality: N/A;   large normal coronary arteries in a dominant RCA system;  moderate LV systoic dysfunction w/ diffuse hypocontractility (compatible w/ nonischemic cardiomyopathy), LV end diastoilc pressure normal, LVEF 35-45% by visual estimate   Left  knee replaced   05/2010   NASAL LACRIMAL DUCT SURGERY  06/14/2009   REDO RIGHT MODIFIED RADICAL NECK DISSECTION  08-29-2007    [facility]   SHOULDER ARTHROSCOPY WITH ROTATOR CUFF REPAIR AND SUBACROMIAL DECOMPRESSION Left 10/17/2017   Procedure: Left shoulder mini open rotator cuff repair, subacromial decompression;  Surgeon: Duwayne Purchase, MD;  Location: [facility];  Service: Orthopedics;  Laterality: Left;  Interscalene Block   TOTAL KNEE ARTHROPLASTY Right 06/03/2018   Procedure: RIGHT TOTAL KNEE ARTHROPLASTY;  Surgeon: Rubie Kemps, MD;  Location: [facility];  Service: Orthopedics;  Laterality: Right;   TOTAL THYROIDECTOMY  2007  -- [facility]   w/ dissection lymph nodes   TRANSANAL HEMORRHOIDAL DEARTERIALIZATION N/A 05/20/2021   Procedure: TRANSANAL HEMORRHOIDAL DEARTERIALIZATION;  Surgeon: Debby Hila, MD;  Location: [facility];  Service: General;  Laterality: N/A;   TRANSTHORACIC ECHOCARDIOGRAM  08-20-2017  dr bernie   ef 25-30%, severe diffuse hypokinesis with no identifiable regional variations, but with profound dyssynchrony, due to arrhythmia insuffient to evaluate LV diastolic dysfunction/  trivial MR/  mild LAE/ Ventricular septum motion abnormal funtion,dyssynergy, & paradox   WRIST SURGERY Right 2001    Current Medications: Current Meds  Medication Sig   allopurinol (ZYLOPRIM) 300 MG tablet Take 33 mg by mouth daily.   amoxicillin (AMOXIL) 500 MG capsule Take 500 mg by mouth as needed (4 capsules prior to dental procedures).   aspirin  EC 81 MG tablet Take 81 mg by mouth daily.   aspirin -acetaminophen -caffeine (EXCEDRIN MIGRAINE) 250-250-65 MG tablet Take 1 tablet by mouth every 6 (six) hours as needed for headache.   buPROPion  (WELLBUTRIN  XL) 150 MG 24 hr tablet Take 450 mg by mouth every morning.    cetirizine (ZYRTEC) 10 MG tablet Take 10 mg by mouth daily.   Cholecalciferol (VITAMIN D) 50 MCG (2000 UT) CAPS Take 1 capsule by mouth daily.   cinacalcet (SENSIPAR) 30 MG  tablet Take 30 mg by mouth 3 (three) times a week. On hold Mon, Wed, Fri   clindamycin (CLEOCIN T) 1 % external solution Apply 1 application  topically 2 (two) times daily as needed (rash).   clonazePAM  (KLONOPIN ) 0.5 MG tablet Take 0.25 mg by mouth at bedtime.   doxycycline  (VIBRAMYCIN ) 100 MG capsule Take 100 mg by mouth as directed.   furosemide  (LASIX ) 20 MG tablet Take 1 tablet (20 mg total) by mouth daily.   glipiZIDE (GLUCOTROL XL) 5 MG 24 hr tablet Take 5 mg by mouth daily.   HUMIRA PEN 40 MG/0.4ML PNKT Inject 40 mg into the skin every 14 (fourteen) days.   levothyroxine  (SYNTHROID ) 112 MCG tablet Take 100 mcg by mouth daily.   liothyronine (CYTOMEL) 5 MCG tablet Take 5 mcg by mouth daily.   magnesium  oxide (MAG-OX) 400 MG tablet Take 1 tablet (400 mg total) by mouth 2 (two) times daily.   mesalamine  (LIALDA ) 1.2 g EC tablet Take 1.2 g by mouth at bedtime.   metFORMIN  (GLUCOPHAGE -XR) 500 MG 24 hr tablet Take 500-1,000 mg by mouth See admin instructions. Take 2 tablets (1,000 mg) in the morning and 1 tablet (500 mg) at bedtime   methocarbamol  (ROBAXIN ) 500 MG tablet Take 500 mg by mouth at bedtime.   metoprolol  succinate (TOPROL -XL) 50 MG 24 hr tablet Take 1 tablet (50 mg total) by mouth daily. Take with or immediately following a meal.   Misc Natural Products (COSAMIN ASU ADVANCED FORMULA) CAPS Take 2 tablets by mouth daily. Unknown strength   mometasone (ELOCON) 0.1 % lotion Apply 1 application  topically 2 (two) times daily as needed (dry skin).   MOUNJARO 7.5 MG/0.5ML Pen Inject into the skin.   nystatin (MYCOSTATIN/NYSTOP) powder Apply 1 application topically as needed (yeast).   pantoprazole  (PROTONIX ) 40 MG tablet Take 40 mg by mouth daily.   polyethylene glycol powder (GLYCOLAX /MIRALAX ) powder Take 17 g by mouth daily as needed for mild constipation or moderate constipation (for constipation.).   rosuvastatin (CRESTOR) 5 MG tablet Take 5 mg by mouth 3 (three) times a week. Mon,  Wed, Fri   sacubitril -valsartan  (ENTRESTO ) 49-51 MG Take 1 tablet by mouth 2 (two) times daily.   TRULANCE 3 MG TABS Take 1 tablet by mouth daily as needed (IBS).   zoledronic acid (RECLAST) 5 MG/100ML SOLN injection Inject 5 mg into the vein  once. Q year     Allergies:   Semaglutide, Hydroxychloroquine, Sulfa antibiotics, Ace inhibitors, Canagliflozin, Empagliflozin, Exenatide, Gabapentin , Lisinopril, Methotrexate , Pravastatin, and Propofol    Social History   Socioeconomic History   Marital status: Married    Spouse name: Lynwood   Number of children: 2   Years of education: 12   Highest education level: Not on file  Occupational History   Occupation: Production manager for [employer]    Employer:  [employer]  Tobacco Use   Smoking status: Former    Current packs/day: 0.00    Average packs/day: 3.0 packs/day for 10.0 years (30.0 ttl pk-yrs)    Types: Cigarettes    Start date: 07/17/1970    Quit date: 07/17/1980    Years since quitting: 43.8    Passive exposure: Past   Smokeless tobacco: Never  Vaping Use   Vaping status: Never Used  Substance and Sexual Activity   Alcohol use: Yes    Comment: very occ   Drug use: No   Sexual activity: Not Currently    Partners: Male    Birth control/protection: Post-menopausal    Comment: menarche 68yo, sexual debut 68yo  Other Topics Concern   Not on file  Social History Narrative   Jonasia was born in [place], [place]. She moved to   in 1978 when her family moved to this state. Kahlea currently lives in [place] with her husband of 32 years. They have 2 adult children and 1 grandson. 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 She enjoys gardening.   Social Drivers of Corporate investment banker Strain: Low Risk  (04/10/2024)   Received from [facility]   Overall Financial Resource Strain (CARDIA)    Difficulty of Paying Living Expenses: Not hard at all  Food Insecurity: No Food Insecurity  (04/10/2024)   Received from [facility]   Hunger Vital Sign    Within the past 12 months, you worried that your food would run out before you got the money to buy more.: Never true    Within the past 12 months, the food you bought just didn't last and you didn't have money to get more.: Never true  Transportation Needs: No Transportation Needs (04/10/2024)   Received [facility] - Transportation    In the past 12 months, has lack of transportation kept you from medical appointments or from getting medications?: No    Lack of Transportation (Non-Medical): No  Physical Activity: Not on file  Stress: Not on file  Social Connections: Not on file     Family History: The patient's family history includes Arthritis in her mother; Asthma in her son; Cancer in her brother; Depression in her mother; Diabetes in her father; Gout in her brother, brother, and brother; Heart disease in her father and sister; Hypertension in her father; Stroke in her mother. There is no history of Breast cancer. ROS:   Please see the history of present illness.    All 14 point review of systems negative except as described per history of present illness  EKGs/Labs/Other Studies Reviewed:         Recent Labs: No results found for requested labs within last 365 days.  Recent Lipid Panel    Component Value Date/Time   CHOL 167 04/03/2023 0848   TRIG 204 (H) 04/03/2023 0848   HDL 52 04/03/2023 0848   CHOLHDL 3.2 04/03/2023 0848   LDLCALC 81 04/03/2023 0848  Physical Exam:    VS:  BP 110/76   Pulse 92   Ht 5' 6.75 (1.695 m)   Wt 200 lb (90.7 kg)   SpO2 96%   BMI 31.56 kg/m     Wt Readings from Last 3 Encounters:  05/07/24 200 lb (90.7 kg)  01/29/24 208 lb 3.2 oz (94.4 kg)  12/17/23 209 lb (94.8 kg)     GEN:  Well nourished, well developed in no acute distress HEENT: Normal NECK: No JVD; No carotid bruits LYMPHATICS: No lymphadenopathy CARDIAC:  RRR, no murmurs, no rubs, no gallops RESPIRATORY:  Clear to auscultation without rales, wheezing or rhonchi  ABDOMEN: Soft, non-tender, non-distended MUSCULOSKELETAL:  No edema; No deformity  SKIN: Warm and dry LOWER EXTREMITIES: no swelling NEUROLOGIC:  Alert and oriented x 3 PSYCHIATRIC:  Normal affect   ASSESSMENT:    1. Chronic combined systolic and diastolic congestive heart failure (HCC)   2. Nonsustained ventricular tachycardia (HCC)   3. Primary hypertension   4. Type 2 diabetes mellitus with stage 3b chronic kidney disease, unspecified whether long term insulin  use (HCC)    PLAN:    In order of problems listed above:  Chronic combined systolic diastolic congestive heart failure overall on physical exam compensated I will not change any medication right now I will simply bring her back to my office in about 2 months to reassess the situation I think she is still recovering from sickness that she suffered from for about 2 months. Essential hypertension blood pressure well-controlled continue present management Type 2 diabetes followed by internal medicine team   Medication Adjustments/Labs and Tests Ordered: Current medicines are reviewed at length with the patient today.  Concerns regarding medicines are outlined above.  No orders of the defined types were placed in this encounter.  Medication changes: No orders of the defined types were placed in this encounter.   Signed, Lamar DOROTHA Fitch, MD, [facility] 05/07/2024 3:50 PM    [facility]

## 2024-05-26 ENCOUNTER — Ambulatory Visit: Admit: 2024-05-26 | Admitting: Ophthalmology

## 2024-05-26 SURGERY — PHACOEMULSIFICATION, CATARACT, WITH IOL INSERTION
Anesthesia: Topical | Laterality: Left

## 2024-06-09 ENCOUNTER — Ambulatory Visit: Admit: 2024-06-09 | Admitting: Ophthalmology

## 2024-06-09 SURGERY — PHACOEMULSIFICATION, CATARACT, WITH IOL INSERTION
Anesthesia: Topical | Laterality: Right

## 2024-06-16 ENCOUNTER — Encounter: Payer: Self-pay | Admitting: Cardiology

## 2024-06-16 DIAGNOSIS — R002 Palpitations: Secondary | ICD-10-CM

## 2024-06-19 ENCOUNTER — Telehealth: Payer: Self-pay | Admitting: Cardiology

## 2024-06-19 NOTE — Telephone Encounter (Signed)
  Patient needs an appt to come in to have her heart montior placed. Please call

## 2024-06-23 ENCOUNTER — Ambulatory Visit

## 2024-06-24 ENCOUNTER — Ambulatory Visit: Attending: Cardiology

## 2024-06-30 ENCOUNTER — Ambulatory Visit: Admitting: Cardiology

## 2024-07-15 ENCOUNTER — Ambulatory Visit (INDEPENDENT_AMBULATORY_CARE_PROVIDER_SITE_OTHER)

## 2024-07-15 ENCOUNTER — Encounter: Payer: Self-pay | Admitting: Podiatry

## 2024-07-15 ENCOUNTER — Ambulatory Visit (INDEPENDENT_AMBULATORY_CARE_PROVIDER_SITE_OTHER): Admitting: Podiatry

## 2024-07-15 DIAGNOSIS — M84375A Stress fracture, left foot, initial encounter for fracture: Secondary | ICD-10-CM | POA: Diagnosis not present

## 2024-07-15 DIAGNOSIS — M7742 Metatarsalgia, left foot: Secondary | ICD-10-CM

## 2024-07-15 NOTE — Progress Notes (Unsigned)
 Subjective: Chief Complaint  Patient presents with   Foot Pain    Left foot pain under big toe.2 pain. A1C 6.7. Ankle doing well today.   68 year old female presents the office today with above concerns.  She said the ankle is doing better but she is having pain mostly to the ball of her foot.  Does not affect 2 months.  No significant swelling or any injuries that she reports.  No changes in activity.  No other treatment.  Objective: AAO x3, NAD DP/PT pulses palpable bilaterally, CRT less than 3 seconds She has tenderness submetatarsal area mostly on the first interspace and plantarly.  She also has some pinpoint tenderness noted on the third metatarsal distally.  There is no significant edema there is no erythema.  Flexor, extensor tendons intact. No pain with calf compression, swelling, warmth, erythema  Assessment: Concern for stress fracture left third metatarsal; metatarsalgia  Plan: -All treatment options discussed with the patient including all alternatives, risks, complications.  -X-rays obtained reviewed.  Multiple views obtained.  In the oblique view there is a concern for radiolucency along the metatarsal neck. -Given pinpoint tenderness as well as concern on x-ray recommend immobilization in surgical shoe which she already has at home.  I would wear this until the symptoms resolve over the next couple weeks.  Once her pain improves and she will start to transition to a good supportive shoe gear and I dispensed a gel metatarsal pad for offloading.  Discussed icing. -If symptoms continue recommend repeat x-ray. -Patient encouraged to call the office with any questions, concerns, change in symptoms.   Return for left foot pain, possible stress fracture in 4-6 weeks .  Meagan Allen DPM

## 2024-07-23 ENCOUNTER — Other Ambulatory Visit: Payer: Self-pay | Admitting: *Deleted

## 2024-07-23 ENCOUNTER — Encounter: Payer: Self-pay | Admitting: *Deleted

## 2024-07-23 ENCOUNTER — Encounter: Payer: Self-pay | Admitting: Cardiology

## 2024-07-25 ENCOUNTER — Ambulatory Visit: Attending: Cardiology | Admitting: Cardiology

## 2024-07-25 ENCOUNTER — Encounter: Payer: Self-pay | Admitting: Cardiology

## 2024-07-25 VITALS — BP 124/84 | HR 104 | Ht 67.0 in | Wt 200.4 lb

## 2024-07-25 DIAGNOSIS — R001 Bradycardia, unspecified: Secondary | ICD-10-CM

## 2024-07-25 DIAGNOSIS — R002 Palpitations: Secondary | ICD-10-CM

## 2024-07-25 DIAGNOSIS — E1122 Type 2 diabetes mellitus with diabetic chronic kidney disease: Secondary | ICD-10-CM

## 2024-07-25 DIAGNOSIS — E782 Mixed hyperlipidemia: Secondary | ICD-10-CM | POA: Diagnosis not present

## 2024-07-25 DIAGNOSIS — I4729 Other ventricular tachycardia: Secondary | ICD-10-CM

## 2024-07-25 DIAGNOSIS — N1832 Chronic kidney disease, stage 3b: Secondary | ICD-10-CM | POA: Diagnosis not present

## 2024-07-25 DIAGNOSIS — I10 Essential (primary) hypertension: Secondary | ICD-10-CM

## 2024-07-25 DIAGNOSIS — I5042 Chronic combined systolic (congestive) and diastolic (congestive) heart failure: Secondary | ICD-10-CM

## 2024-07-25 NOTE — Progress Notes (Unsigned)
 " Cardiology Office Note:    Date:  07/25/2024   ID:  Meagan Allen, DOB [birth year], MRN [id number]  PCP:  [name], MD  Cardiologist:  [name], MD    Referring MD: [name], MD   No chief complaint on file. Doing fair  History of Present Illness:    Meagan Allen is a 69 y.o. female  past medical history significant for nonischemic cardiomyopathy, HTN initial ejection fraction 30 to 35% but improved with guideline directed medical therapy, cardiac catheterization done a few years ago showed no significant obstructive disease. Additional problem include essential hypertension, diabetes. Recently she started experiencing some headache. She went to her rheumatologist suspicion for left temporal arteritis has been raised. Then she ended up having biopsy of this lesion after she was given high-dose of steroids. While she was having surgery there was significant drop in her heart rate down to 30 and eventually she ended up being transferred to the emergency room in the emergency room her metoprolol  has been completely discontinued and she has been doing better in terms of dizziness since that time. Additional problem include neuropathy in lower extremities which make her unsteady on her feet  Last time I seen her concern was ejection fraction being low normal at 51% as proven by MRI, she also wore monitor which show high burden of PVCs 10% with 3 runs of nonsustained ventricular tachycardia.  After that he seen our EP team and concern was that this probably post steroids  She comes today to months for follow-up cardiac wise seems to be doing fine she complains about a lot of things biggest complaint is a headache she did see different specialist different maneuvers are being done including some physical therapy some injection in the trigeminal trigger points in spite of that still have a lot of headache she is very frustrated by that.  She is scheduled to see neurologist  but appointment is within months.  She denies having any chest pain tightness squeezing pressure burning chest she did wear monitor monitor shows some symptomatic supraventricular tachycardia.  She said palpitation not as frequent but still happening occasionally.  Past Medical History:  Diagnosis Date   Anemia    no recent iron transfusion per pt on 05-16-2021   Anxiety    Arthritis    osteo, inflammatory arthritis and rheumatoid arthritis   Cancer (HCC)    thyroid  cancer   CHF (congestive heart failure) (HCC)    diagnosed feb 2019   Deafness in right ear    Depression    Dysphagia 07/19/2020   occ   Dyspnea    on exertion on rare occasions   Enlarged liver    Expressive aphasia 03/15/2020   resolved per pt on 05-16-2021   Fibromyalgia    Gastroesophageal reflux disease with esophagitis without hemorrhage 07/19/2020   GERD (gastroesophageal reflux disease)    Headache disorder 03/15/2020   Heart murmur    High uric acid in 24 hour urine specimen 09/24/2019   History of colon polyps    History of corticosteroid therapy 07/13/2020   History of COVID-19 03/2021   mild symptoms x 5 days all symptoms resolved   History of thyroid  cancer 2007;  2009   dx papillary thyroid  cancer w/ mets to cervical lymph nodes   HOH (hard of hearing)    left ear   HTN (hypertension) 03/25/2012   Hypercalcemia 07/13/2020   Hyperlipidemia    Hypertension    Hypothyroidism 07/13/2020  IBS (irritable bowel syndrome)    constipation issues   Iron deficiency anemia 07/13/2020   Lesion of liver 07/19/2020   Meningioma (HCC) 03/29/2021   8 mm left frontal lobe unchanged suspected 3 mm left ant temporal lobe memingioma unchanged in size per 03-29-2021 brain mri   Migraine    Mood changes    Morbid obesity (HCC) 07/13/2020   Nonischemic cardiomyopathy [facility]) cardiologist-  dr margean   per cardiac cath 08-28-2017  moderate LV dysfunction w/ diffuse hypocontractility ,  ef 35-40%   Optic nerve  and pathway injury, left, initial encounter    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, per opthamologist black spot middle of left eye   PONV (postoperative nausea and vomiting)    last colonoscopy propfol bp dropped and vomiting   Post-surgical hypothyroidism    Primary localized osteoarthrosis of ankle and foot 02/13/2020   and hands   Rectal bleeding 05/02/2021   Rosacea    Thyroid  cancer (HCC) 06/25/2006   surgery and radioactive iodine done   Type 2 diabetes mellitus (HCC)    followed by pcp   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HCC)    Wears glasses    Wears hearing aid in both ears    left ear hearing aid, microphone in right ear    Past Surgical History:  Procedure Laterality Date   CARDIOVASCULAR STRESS TEST  08-20-2017   dr bernie   High risk nuclear study w/ large irreverisible defect in the basal and mid inferoseptal, inferior, inferolateral walls and apical septal, inferior walls with small peri infarct ischemia in the apical anterior & lateral walls (consistant w/ prior MI )/  no ST segment deviation noted /  nuclear stress ef 36%/  recommended cardiac cath   CARPOMETACARPAL [facility]) FUSION OF THUMB Bilateral 2003   COLONOSCOPY WITH PROPOFOL  N/A 05/06/2013   Procedure: COLONOSCOPY WITH PROPOFOL ;  Surgeon: Gladis MARLA Louder, MD;  Location: [facility];  Service: Endoscopy;  Laterality: N/A;   D & C HYSTEROSCOPY /  RESECTION POLYP/ ROLLER BALL ABLATION  02-26-2004   dr janett  [facility]   DILATION AND CURETTAGE OF UTERUS  1984   EXCISION MORTON'S NEUROMA Left 1996   giant cell tumor  2011   Left ankle 2011   JOINT REPLACEMENT Left 05/2020   2 [facility] [place]   KNEE ARTHROSCOPY  08-16-2010  dr vickye  [facility];  05/ 2012   right x2 left x1   LEFT HEART CATH AND CORONARY ANGIOGRAPHY N/A 08/28/2017   Procedure: LEFT HEART CATH AND CORONARY ANGIOGRAPHY;  Surgeon: Burnard Debby LABOR, MD;  Location: [facility];  Service: Cardiovascular;  Laterality: N/A;   large normal  coronary arteries in a dominant RCA system;  moderate LV systoic dysfunction w/ diffuse hypocontractility (compatible w/ nonischemic cardiomyopathy), LV end diastoilc pressure normal, LVEF 35-45% by visual estimate   Left knee replaced   05/2010   NASAL LACRIMAL DUCT SURGERY  06/14/2009   REDO RIGHT MODIFIED RADICAL NECK DISSECTION  08-29-2007    [facility]   SHOULDER ARTHROSCOPY WITH ROTATOR CUFF REPAIR AND SUBACROMIAL DECOMPRESSION Left 10/17/2017   Procedure: Left shoulder mini open rotator cuff repair, subacromial decompression;  Surgeon: Duwayne Purchase, MD;  Location: [facility];  Service: Orthopedics;  Laterality: Left;  Interscalene Block   TOTAL KNEE ARTHROPLASTY Right 06/03/2018   Procedure: RIGHT TOTAL KNEE ARTHROPLASTY;  Surgeon: Rubie Kemps, MD;  Location: [facility];  Service: Orthopedics;  Laterality: Right;   TOTAL THYROIDECTOMY  2007  -- [facility]  w/ dissection lymph nodes   TRANSANAL HEMORRHOIDAL DEARTERIALIZATION N/A 05/20/2021   Procedure: TRANSANAL HEMORRHOIDAL DEARTERIALIZATION;  Surgeon: Debby Hila, MD;  Location[facility];  Service: General;  Laterality: N/A;   TRANSTHORACIC ECHOCARDIOGRAM  08-20-2017  dr bernie   ef 25-30%, severe diffuse hypokinesis with no identifiable regional variations, but with profound dyssynchrony, due to arrhythmia insuffient to evaluate LV diastolic dysfunction/  trivial MR/  mild LAE/ Ventricular septum motion abnormal funtion,dyssynergy, & paradox   WRIST SURGERY Right 2001    Current Medications: Active Medications[1]   Allergies:   Semaglutide, Hydroxychloroquine, Sulfa antibiotics, Ace inhibitors, Canagliflozin, Empagliflozin, Exenatide, Gabapentin , Lisinopril, Methotrexate , Pravastatin, Propofol , and Prucalopride   Social History   Socioeconomic History   Marital status: Married    Spouse name: Lynwood   Number of children: 2   Years of education: 12   Highest education level: Not on file  Occupational History   Occupation:  Production Manager for [employer]    Employer: [employer]  Tobacco Use   Smoking status: Former    Current packs/day: 0.00    Average packs/day: 3.0 packs/day for 10.0 years (30.0 ttl pk-yrs)    Types: Cigarettes    Start date: 07/17/1970    Quit date: 07/17/1980    Years since quitting: 44.0    Passive exposure: Past   Smokeless tobacco: Never  Vaping Use   Vaping status: Never Used  Substance and Sexual Activity   Alcohol use: Yes    Comment: very occ   Drug use: No   Sexual activity: Not Currently    Partners: Male    Birth control/protection: Post-menopausal    Comment: menarche 69yo, sexual debut 69yo  Other Topics Concern   Not on file  Social History Narrative   Ahria was born in [place], [place]. She moved to [place]  in 1978 when her family moved to this state. Mahum currently lives in [place] with her husband of 32 years. They have 2 adult children and 1 grandson. 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 She enjoys gardening.   Social Drivers of Health   Tobacco Use: Medium Risk (07/25/2024)   Patient History    Smoking Tobacco Use: Former    Smokeless Tobacco Use: Never    Passive Exposure: Past  Physicist, Medical Strain: Low Risk  (04/10/2024)   Received from [facility]   Overall Financial Resource Strain (CARDIA)    Difficulty of Paying Living Expenses: Not hard at all  Food Insecurity: Low Risk (05/19/2024)   Received from [facility]   Epic    Within the past 12 months, you worried that your food would run out before you got money to buy more: Never true    Within the past 12 months, the food you bought just didn't last and you didn't have money to get more. : Never true  Transportation Needs: No Transportation Needs (05/19/2024)   Received from [facility]    In the past 12 months, has lack of reliable transportation kept you from medical appointments, meetings, work or from getting things  needed for daily living? : No  Physical Activity: Not on file  Stress: Not on file  Social Connections: Not on file  Depression (PHQ2-9): Low Risk (01/29/2024)   Depression (PHQ2-9)    PHQ-2 Score: 0  Alcohol Screen: Not on file  Housing: Low Risk  (06/03/2024)   Received from [facility]   Epic  In the last 12 months, was there a time when you were not able to pay the mortgage or rent on time?: No    In the past 12 months, how many times have you moved where you were living?: 0    At any time in the past 12 months, were you homeless or living in a shelter (including now)?: No  Utilities: Low Risk (05/19/2024)   Received from [facility]   Utilities    In the past 12 months has the electric, gas, oil, or water  company threatened to shut off services in your home? : No  Health Literacy: Not on file     Family History: The patient's family history includes Arthritis in her mother; Asthma in her son; Cancer in her brother; Depression in her mother; Diabetes in her father; Gout in her brother, brother, and brother; Heart disease in her father and sister; Hypertension in her father; Stroke in her mother. There is no history of Breast cancer. ROS:   Please see the history of present illness.    All 14 point review of systems negative except as described per history of present illness  EKGs/Labs/Other Studies Reviewed:         Recent Labs: No results found for requested labs within last 365 days.  Recent Lipid Panel    Component Value Date/Time   CHOL 167 04/03/2023 0848   TRIG 204 (H) 04/03/2023 0848   HDL 52 04/03/2023 0848   CHOLHDL 3.2 04/03/2023 0848   LDLCALC 81 04/03/2023 0848    Physical Exam:    VS:  BP (!) 122/92   Pulse (!) 104   Ht 5' 7 (1.702 m)   Wt 200 lb 6 oz (90.9 kg)   SpO2 97%   BMI 31.38 kg/m     Wt Readings from Last 3 Encounters:  07/25/24 200 lb 6 oz (90.9 kg)  05/07/24 200 lb (90.7 kg)  01/29/24 208 lb 3.2 oz (94.4 kg)      GEN:  Well nourished, well developed in no acute distress HEENT: Normal NECK: No JVD; No carotid bruits LYMPHATICS: No lymphadenopathy CARDIAC: RRR, no murmurs, no rubs, no gallops RESPIRATORY:  Clear to auscultation without rales, wheezing or rhonchi  ABDOMEN: Soft, non-tender, non-distended MUSCULOSKELETAL:  No edema; No deformity  SKIN: Warm and dry LOWER EXTREMITIES: no swelling NEUROLOGIC:  Alert and oriented x 3 PSYCHIATRIC:  Normal affect   ASSESSMENT:    1. Chronic combined systolic and diastolic congestive heart failure (HCC)   2. Primary hypertension   3. Type 2 diabetes mellitus with stage 3b chronic kidney disease, unspecified whether long term insulin  use (HCC)   4. Bradycardia   5. Mixed hyperlipidemia    PLAN:    In order of problems listed above:  Chronic systolic diastolic congestive heart failure, compensated on physical exam continue present management. Essential hypertension blood pressure well-controlled continue present management. Symptomatic supraventricular tachycardia she is on small dose of beta-blocker metoprolol  succinate 25 mg daily, that medication has been reduced previously because of bradycardia now with facing opposite problem but at the same time there is some issue with her thyroid , I will check her TSH and thyroid  to see if that need to be adjusted first before trying to increase dose of beta-blocker.  Will be very careful with that maneuver because of her history of bradycardia. Dyslipidemia.  I did review [facility] which show me her LDL 81 HDL 52 will continue present management for now   Medication Adjustments/Labs  and Tests Ordered: Current medicines are reviewed at length with the patient today.  Concerns regarding medicines are outlined above.  No orders of the defined types were placed in this encounter.  Medication changes: No orders of the defined types were placed in this encounter.   Signed, Lamar DOROTHA Fitch, MD, [facility] 07/25/2024  8:31 AM    [facility]    [1]  Current Meds  Medication Sig   allopurinol (ZYLOPRIM) 300 MG tablet Take 300 mg by mouth daily.   amoxicillin (AMOXIL) 500 MG capsule Take 500 mg by mouth as needed (4 capsules prior to dental procedures).   aspirin  EC 81 MG tablet Take 81 mg by mouth daily.   aspirin -acetaminophen -caffeine (EXCEDRIN MIGRAINE) 250-250-65 MG tablet Take 1 tablet by mouth every 6 (six) hours as needed for headache.   azelastine (ASTELIN) 0.1 % nasal spray Place 1 spray into both nostrils 2 (two) times daily.   buPROPion  (WELLBUTRIN  XL) 150 MG 24 hr tablet Take 450 mg by mouth every morning.    cetirizine (ZYRTEC) 10 MG tablet Take 10 mg by mouth daily.   Cholecalciferol (VITAMIN D) 50 MCG (2000 UT) CAPS Take 1 capsule by mouth daily.   cinacalcet (SENSIPAR) 30 MG tablet Take 30 mg by mouth 3 (three) times a week. On hold Mon, Wed, Fri   clindamycin (CLEOCIN T) 1 % external solution Apply 1 application  topically 2 (two) times daily as needed (rash).   clonazePAM  (KLONOPIN ) 0.5 MG tablet Take 0.25 mg by mouth at bedtime.   doxycycline (VIBRAMYCIN) 100 MG capsule Take 100 mg by mouth as directed.   furosemide  (LASIX ) 20 MG tablet Take 1 tablet (20 mg total) by mouth daily.   gabapentin  (NEURONTIN ) 300 MG capsule Take 300 mg by mouth at bedtime.   glipiZIDE (GLUCOTROL XL) 5 MG 24 hr tablet Take 5 mg by mouth daily.   HUMIRA PEN 40 MG/0.4ML PNKT Inject 40 mg into the skin every 14 (fourteen) days.   levothyroxine  (SYNTHROID ) 112 MCG tablet Take 100 mcg by mouth daily.   liothyronine (CYTOMEL) 5 MCG tablet Take 5 mcg by mouth daily.   magnesium  oxide (MAG-OX) 400 (240 Mg) MG tablet Take 1 tablet by mouth 2 (two) times daily.   mesalamine  (LIALDA ) 1.2 g EC tablet Take 1.2 g by mouth at bedtime.   metFORMIN  (GLUCOPHAGE -XR) 500 MG 24 hr tablet Take 500-1,000 mg by mouth See admin instructions. Take 2 tablets (1,000 mg) in the morning and 1 tablet (500 mg) at  bedtime   methocarbamol  (ROBAXIN ) 500 MG tablet Take 500 mg by mouth at bedtime.   metoprolol  succinate (TOPROL -XL) 25 MG 24 hr tablet Take 25 mg by mouth daily.   Misc Natural Products (COSAMIN ASU ADVANCED FORMULA) CAPS Take 2 tablets by mouth daily. Unknown strength   mometasone (ELOCON) 0.1 % lotion Apply 1 application  topically 2 (two) times daily as needed (dry skin).   MOUNJARO 7.5 MG/0.5ML Pen Inject into the skin.   nystatin (MYCOSTATIN/NYSTOP) powder Apply 1 application topically as needed (yeast).   pantoprazole  (PROTONIX ) 40 MG tablet Take 40 mg by mouth daily.   polyethylene glycol powder (GLYCOLAX /MIRALAX ) powder Take 17 g by mouth daily as needed for mild constipation or moderate constipation (for constipation.).   potassium chloride  (MICRO-K ) 10 MEQ CR capsule Take 10 mEq by mouth daily.   rosuvastatin (CRESTOR) 5 MG tablet Take 5 mg by mouth 3 (three) times a week. Mon, Wed, Fri   sacubitril -valsartan  (ENTRESTO )  49-51 MG Take 1 tablet by mouth 2 (two) times daily.   TRULANCE 3 MG TABS Take 1 tablet by mouth daily as needed (IBS).   zoledronic acid (RECLAST) 5 MG/100ML SOLN injection Inject 5 mg into the vein once. Q year   [DISCONTINUED] magnesium  oxide (MAG-OX) 400 MG tablet Take 1 tablet (400 mg total) by mouth 2 (two) times daily.   [DISCONTINUED] metoprolol  succinate (TOPROL -XL) 50 MG 24 hr tablet Take 1 tablet (50 mg total) by mouth daily. Take with or immediately following a meal.   "

## 2024-07-25 NOTE — Patient Instructions (Signed)
 Medication Instructions:  Your physician recommends that you continue on your current medications as directed. Please refer to the Current Medication list given to you today.  *If you need a refill on your cardiac medications before your next appointment, please call your pharmacy*   Lab Work: Your physician recommends that you have a TSH and thyroid  panel today in the office.  If you have labs (blood work) drawn today and your tests are completely normal, you will receive your results only by: MyChart Message (if you have MyChart) OR A paper copy in the mail If you have any lab test that is abnormal or we need to change your treatment, we will call you to review the results.   Testing/Procedures: None ordered   Follow-Up: At [facility], you and your health needs are our priority.  As part of our continuing mission to provide you with exceptional heart care, we have created designated Provider Care Teams.  These Care Teams include your primary Cardiologist (physician) and Advanced Practice Providers (APPs -  Physician Assistants and Nurse Practitioners) who all work together to provide you with the care you need, when you need it.  We recommend signing up for the patient portal called MyChart.  Sign up information is provided on this After Visit Summary.  MyChart is used to connect with patients for Virtual Visits (Telemedicine).  Patients are able to view lab/test results, encounter notes, upcoming appointments, etc.  Non-urgent messages can be sent to your provider as well.   To learn more about what you can do with MyChart, go to [email or web address].    Your next appointment:   3 month(s)  The format for your next appointment:   In Person  Provider:   [name], MD    Other Instructions none  Important Information About Sugar

## 2024-07-26 LAB — THYROID PANEL WITH TSH
Free Thyroxine Index: 2.4 (ref 1.2–4.9)
T3 Uptake Ratio: 29 % (ref 24–39)
T4, Total: 8.4 ug/dL (ref 4.5–12.0)
TSH: 3.8 u[IU]/mL (ref 0.450–4.500)

## 2024-07-30 ENCOUNTER — Encounter: Payer: Self-pay | Admitting: Cardiology

## 2024-07-31 ENCOUNTER — Telehealth: Payer: Self-pay

## 2024-07-31 ENCOUNTER — Other Ambulatory Visit: Payer: Self-pay

## 2024-07-31 MED ORDER — METOPROLOL TARTRATE 25 MG PO TABS: 12.5000 mg | ORAL_TABLET | Freq: Two times a day (BID) | ORAL | 3 refills | Status: DC

## 2024-07-31 NOTE — Telephone Encounter (Signed)
 Called the patient and she reported that she was already taking Metoprolol  succinate 25 mg daily, but her heart rate was above 100 bpm every day. Spoke to Dr. Krasowski regarding the patient symptoms and he recommended leaving her on the Metoprolol  succinate 25 mg and have her come to the office for an EKG to evaluate her heart rhythm. This recommendation was relayed to the patient and she verbalized understanding and had no further questions at this time. A nurse visit was scheduled for her on 08/05/24 to have her EKG done.

## 2024-08-01 ENCOUNTER — Ambulatory Visit: Payer: Self-pay | Admitting: Cardiology

## 2024-08-04 ENCOUNTER — Ambulatory Visit: Admitting: Podiatry

## 2024-08-04 ENCOUNTER — Telehealth: Payer: Self-pay

## 2024-08-04 NOTE — Telephone Encounter (Signed)
 Left message on My Chart with lab results per Dr. Karry note. Routed to PCP

## 2024-08-05 ENCOUNTER — Telehealth: Payer: Self-pay

## 2024-08-05 ENCOUNTER — Ambulatory Visit: Attending: Cardiology

## 2024-08-05 VITALS — BP 138/90 | HR 98 | Ht 68.0 in | Wt 203.0 lb

## 2024-08-05 DIAGNOSIS — I493 Ventricular premature depolarization: Secondary | ICD-10-CM | POA: Diagnosis not present

## 2024-08-05 MED ORDER — METOPROLOL TARTRATE 25 MG PO TABS: 25.0000 mg | ORAL_TABLET | Freq: Every day | ORAL | 3 refills | Status: AC | PRN

## 2024-08-05 NOTE — Telephone Encounter (Signed)
 Left message on My Chart with lab results per Dr. Karry note. Routed to PCP

## 2024-08-05 NOTE — Addendum Note (Signed)
 Addended by: ARLOA MALLORY D on: 08/05/2024 11:35 AM   Modules accepted: Orders

## 2024-08-05 NOTE — Progress Notes (Signed)
" ° °  Nurse Visit   Date of Encounter: 08/05/2024 ID: Meagan Allen, DOB 21-Jun-1956, MRN [id number]  PCP:  [name], MD   [facility] Providers Cardiologist:  [name], MD Electrophysiologist:  Soyla Gladis Norton, MD      Visit Details   VS:  BP (!) 138/90 (BP Location: Left Arm, Patient Position: Sitting)   Pulse 98   Ht 5' 8 (1.727 m)   Wt 203 lb (92.1 kg)   SpO2 98%   BMI 30.87 kg/m  , BMI Body mass index is 30.87 kg/m.  Wt Readings from Last 3 Encounters:  08/05/24 203 lb (92.1 kg)  07/25/24 200 lb 6 oz (90.9 kg)  05/07/24 200 lb (90.7 kg)     Reason for visit: EKG per Dr. Fitch Performed today: Vitals, EKG Changes (medications, testing, etc.) : Add Metoprolol  Tartrate 25mg  1 tablet as needed daily for Tachycardia Length of Visit: 20 minutes    Medications Adjustments/Labs and Tests Ordered: Orders Placed This Encounter  Procedures   EKG 12-Lead   No orders of the defined types were placed in this encounter.    Signed, Olam JONETTA Lesches, RN  08/05/2024 10:41 AM  "

## 2024-08-06 DIAGNOSIS — R002 Palpitations: Secondary | ICD-10-CM

## 2024-08-12 ENCOUNTER — Ambulatory Visit: Admitting: Speech Pathology

## 2024-08-12 ENCOUNTER — Ambulatory Visit: Payer: Self-pay | Admitting: Cardiology

## 2024-08-13 ENCOUNTER — Telehealth: Payer: Self-pay

## 2024-08-13 ENCOUNTER — Ambulatory Visit: Admitting: Speech Pathology

## 2024-08-13 DIAGNOSIS — R490 Dysphonia: Secondary | ICD-10-CM | POA: Diagnosis present

## 2024-08-13 NOTE — Telephone Encounter (Signed)
 Pt viewed monitor results on My Chart per Dr. Vanetta Shawl note. Routed to PCP.

## 2024-08-13 NOTE — Therapy (Signed)
 " OUTPATIENT SPEECH LANGUAGE PATHOLOGY  VOICE EVALUATION   Patient Name: Meagan Allen MRN: [id number] DOB:May 05, 1956, 69 y.o., female Today's Date: 08/14/2024  PCP: Alm Needle, MD  REFERRING PROVIDER: Chinita Hasten, MD   End of Session - 08/13/24 1525     Visit Number 1    Number of Visits 17    Date for Recertification  11/05/24    Authorization Type [employer]    Progress Note Due on Visit 10    SLP Start Time 1445    SLP Stop Time  1530    SLP Time Calculation (min) 45 min    Activity Tolerance Patient tolerated treatment well          Past Medical History:  Diagnosis Date   Anemia    no recent iron transfusion per pt on 05-16-2021   Anxiety    Arthritis    osteo, inflammatory arthritis and rheumatoid arthritis   Cancer (HCC)    thyroid  cancer   CHF (congestive heart failure) (HCC)    diagnosed feb 2019   Deafness in right ear    Depression    Dysphagia 07/19/2020   occ   Dyspnea    on exertion on rare occasions   Enlarged liver    Expressive aphasia 03/15/2020   resolved per pt on 05-16-2021   Fibromyalgia    Gastroesophageal reflux disease with esophagitis without hemorrhage 07/19/2020   GERD (gastroesophageal reflux disease)    Headache disorder 03/15/2020   Heart murmur    High uric acid in 24 hour urine specimen 09/24/2019   History of colon polyps    History of corticosteroid therapy 07/13/2020   History of COVID-19 03/2021   mild symptoms x 5 days all symptoms resolved   History of thyroid  cancer 2007;  2009   dx papillary thyroid  cancer w/ mets to cervical lymph nodes   HOH (hard of hearing)    left ear   HTN (hypertension) 03/25/2012   Hypercalcemia 07/13/2020   Hyperlipidemia    Hypertension    Hypothyroidism 07/13/2020   IBS (irritable bowel syndrome)    constipation issues   Iron deficiency anemia 07/13/2020   Lesion of liver 07/19/2020   Meningioma (HCC) 03/29/2021   8 mm left frontal lobe unchanged suspected  3 mm left ant temporal lobe memingioma unchanged in size per 03-29-2021 brain mri   Migraine    Mood changes    Morbid obesity (HCC) 07/13/2020   Nonischemic cardiomyopathy [facility]) cardiologist-  dr margean   per cardiac cath 08-28-2017  moderate LV dysfunction w/ diffuse hypocontractility ,  ef 35-40%   Optic nerve and pathway injury, left, initial encounter    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, per opthamologist black spot middle of left eye   PONV (postoperative nausea and vomiting)    last colonoscopy propfol bp dropped and vomiting   Post-surgical hypothyroidism    Primary localized osteoarthrosis of ankle and foot 02/13/2020   and hands   Rectal bleeding 05/02/2021   Rosacea    Thyroid  cancer (HCC) 06/25/2006   surgery and radioactive iodine done   Type 2 diabetes mellitus (HCC)    followed by pcp   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HCC)    Wears glasses    Wears hearing aid in both ears    left ear hearing aid, microphone in right ear   Past Surgical History:  Procedure Laterality Date   CARDIOVASCULAR STRESS TEST  08-20-2017   dr bernie   High risk  nuclear study w/ large irreverisible defect in the basal and mid inferoseptal, inferior, inferolateral walls and apical septal, inferior walls with small peri infarct ischemia in the apical anterior & lateral walls (consistant w/ prior MI )/  no ST segment deviation noted /  nuclear stress ef 36%/  recommended cardiac cath   CARPOMETACARPAL [facility]) FUSION OF THUMB Bilateral 2003   COLONOSCOPY WITH PROPOFOL  N/A 05/06/2013   Procedure: COLONOSCOPY WITH PROPOFOL ;  Surgeon: Gladis MARLA Louder, MD;  Location: [facility] END[facility];  Service: Endoscopy;  Laterality: N/A;   D & C HYSTEROSCOPY /  RESECTION POLYP/ ROLLER BALL ABLATION  02-26-2004   dr janett  [facility]   DILATION AND CURETTAGE OF UTERUS  1984   EXCISION MORTON'S NEUROMA Left 1996   giant cell tumor  2011   Left ankle 2011   JOINT REPLACEMENT Left 05/2020   2 surgical center of  [place]   KNEE ARTHROSCOPY  08-16-2010  dr vickye  [facility];  05/ 2012   right x2 left x1   LEFT HEART CATH AND CORONARY ANGIOGRAPHY N/A 08/28/2017   Procedure: LEFT HEART CATH AND CORONARY ANGIOGRAPHY;  Surgeon: Burnard Debby LABOR, MD;  Location: [facility] INVASIVE CV LAB;  Service: Cardiovascular;  Laterality: N/A;   large normal coronary arteries in a dominant RCA system;  moderate LV systoic dysfunction w/ diffuse hypocontractility (compatible w/ nonischemic cardiomyopathy), LV end diastoilc pressure normal, LVEF 35-45% by visual estimate   Left knee replaced   05/2010   NASAL LACRIMAL DUCT SURGERY  06/14/2009   REDO RIGHT MODIFIED RADICAL NECK DISSECTION  08-29-2007    [facility]   SHOULDER ARTHROSCOPY WITH ROTATOR CUFF REPAIR AND SUBACROMIAL DECOMPRESSION Left 10/17/2017   Procedure: Left shoulder mini open rotator cuff repair, subacromial decompression;  Surgeon: Duwayne Purchase, MD;  Location: [facility] OR[facility];  Service: Orthopedics;  Laterality: Left;  Interscalene Block   TOTAL KNEE ARTHROPLASTY Right 06/03/2018   Procedure: RIGHT TOTAL KNEE ARTHROPLASTY;  Surgeon: Rubie Kemps, MD;  Location: [facility] OR[facility];  Service: Orthopedics;  Laterality: Right;   TOTAL THYROIDECTOMY  2007  -- [facility]   w/ dissection lymph nodes   TRANSANAL HEMORRHOIDAL DEARTERIALIZATION N/A 05/20/2021   Procedure: TRANSANAL HEMORRHOIDAL DEARTERIALIZATION;  Surgeon: Debby Hila, MD;  Location: [facility];  Service: General;  Laterality: N/A;   TRANSTHORACIC ECHOCARDIOGRAM  08-20-2017  dr bernie   ef 25-30%, severe diffuse hypokinesis with no identifiable regional variations, but with profound dyssynchrony, due to arrhythmia insuffient to evaluate LV diastolic dysfunction/  trivial MR/  mild LAE/ Ventricular septum motion abnormal funtion,dyssynergy, & paradox   WRIST SURGERY Right 2001   Patient Active Problem List   Diagnosis Date Noted   Impaired tolerance of activity 10/04/2023   Weakness of right shoulder 10/04/2023   DISH  (diffuse idiopathic skeletal hyperostosis) 09/04/2023   Chronic thoracic spine pain 09/04/2023   Dizziness 07/25/2023   Constipation 07/25/2023   Cold feet 07/25/2023   Bloating 07/25/2023   Venous insufficiency 07/25/2023   Digital mucinous cyst of finger 04/09/2023   Trigger finger, acquired 04/09/2023   At risk for falling 02/07/2023   Weakness of both lower extremities 02/07/2023   Problem involving surgical incision 01/30/2023   Mass of spine 12/12/2022   Ganglion cyst of finger of left hand 10/24/2022   Nonsustained ventricular tachycardia (HCC) 08/01/2022   Renal insufficiency 06/04/2022   Hyperparathyroidism 06/04/2022   Varicose veins of both lower extremities with inflammation 06/04/2022   Chest pain 05/27/2022   AKI (acute kidney injury) 05/27/2022  Bradycardia 05/26/2022   Atherosclerosis 12/19/2021   Wears hearing aid in both ears 09/29/2021   Wears glasses 09/29/2021   Vitreomacular adhesion of both eyes 09/16/2021   Retinoschisis of left eye 09/16/2021   Rectal bleeding 05/02/2021   Meningioma (HCC) 03/29/2021   Encounter for screening for other metabolic disorders 03/25/2021   History of COVID-19 03/2021   Pain in joint of left shoulder 02/21/2021   Pain in joint of left elbow 02/21/2021   Enlarged liver    Sensory ataxia 09/27/2020   Neuropathy 09/27/2020   Dysphagia 07/19/2020   Gastroesophageal reflux disease with esophagitis without hemorrhage 07/19/2020   Lesion of liver 07/19/2020   Chronic kidney disease, stage 3a (HCC) 07/13/2020   History of corticosteroid therapy 07/13/2020   Hypercalcemia 07/13/2020   Hyperglycemia due to type 2 diabetes mellitus (HCC) 07/13/2020   Hypothyroidism 07/13/2020   Iron deficiency anemia 07/13/2020   Morbid obesity (HCC) 07/13/2020   Polyneuropathy due to type 2 diabetes mellitus (HCC) 07/13/2020   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HCC)    Type 2 diabetes mellitus (HCC)    Rosacea    Post-surgical hypothyroidism     PONV (postoperative nausea and vomiting)    Optic nerve and pathway injury, left, initial encounter    Left rotator cuff tear    IBS (irritable bowel syndrome)    Hypertension    Hyperlipidemia    History of thyroid  cancer    History of colon polyps    Heart murmur    Hearing loss of right ear    GERD (gastroesophageal reflux disease)    Fibromyalgia    Dyspnea    Depression    Cancer (HCC)    Arthritis    Anxiety    Anemia    Expressive aphasia 03/15/2020   Headache disorder 03/15/2020   Mood change 03/15/2020   Primary localized osteoarthrosis of ankle and foot 02/13/2020   Primary osteoarthritis, right ankle and foot 02/13/2020   High uric acid in 24 hour urine specimen 09/24/2019   CHF (congestive heart failure) (HCC)    Migraine    Encounter for other specified surgical aftercare 10/31/2017   Osteoarthritis of right knee 10/31/2017   Nonischemic cardiomyopathy (HCC) 08/22/2017   HTN (hypertension) 03/25/2012   Arthritis of knee 03/25/2012   Thyroid  cancer (HCC) 06/25/2006    ONSET DATE: symptoms began mid-August 2025; date of referral 07/31/2024  REFERRING DIAG: R49.0 Dysphonia  THERAPY DIAG:  Dysphonia  Rationale for Evaluation and Treatment Rehabilitation  SUBJECTIVE:   SUBJECTIVE STATEMENT: Pt pleasant, good historian Pt accompanied by: self  PERTINENT HISTORY and DIAGNOSTIC FINDINGS:   Pt is a 69 year old female with history of:  THYROID  CANCER  Papillary thyroid  carcinoma - S/p total thyroidectomy, right neck dissection; pathology demonstrated follicular variant papillary thyroid  cancer (3.5 cm) with 4/15 LNs - Underwent RAI therapy (2007) - Patient had repeat neck dissection/mediastinum dissection.  HYPERPARATHYROIDISM (MULTI-GLANDULAR)   GERD  Per pulmonology note dated 07/01/2024 Chronic Cough - She reports a persistent dry cough, improved since mid-August 2025 but still present, worsened by dairy. - She occasionally wakes with a foggy  throat and drainage. - She has not used saline sprays or rinses and has been prescribed a nasal antibiotic but has not started it.  Additional past medical history includes inflammatory arthritis, type 2 diabetes, HFrEF, meningiomas with brain scans every 1-2 years that also showed sinus thickening,    Recent ENT note on 07/30/2024 reports Chief complaint of hoarseness described as a breathy, loss  of voice, raspy, and strained and mild in severity - Pt had URI with cough, hoarse with weak voice for 6 weeks, no major path seen - Dry mouth and hoarseness after speaking; after speaking she loses her voice - Hoarseness has been on/off for 5 months Laryngoscopy revealed mild bowing, no tumor or mass seen of note pt with sepital deviation, convex, airway obstruction 50-75%   PAIN:  Are you having pain? No    LIVING ENVIRONMENT: Lives with: lives with their spouse Lives in: House/apartment  PLOF: Independent  PATIENT GOALS    to improve voice  OBJECTIVE:  COGNITION: Overall cognitive status: Within functional limits for tasks assessed  SOCIAL HISTORY: Occupation: retired Water  intake: optimal Caffeine/alcohol intake: minimal Daily voice use: moderate Environmental risks: None reported Occupational risks: None identified Misuse: Excessively low pitch, Speaks without adequate warm-up, and Speaks without adequate breath support: She has a history of heavy smoking (3 packs/day for 10 years).  Phonotraumatic behaviors: Excessive voice use during colds/illnesses and Excessive and/or habitual throat clearing  PERCEPTUAL VOICE ASSESSMENT: Voice quality: normal Vocal abuse: habitual throat clearing and has history of such Resonance: normal Respiratory function: clavicular breathing  OBJECTIVE VOICE ASSESSMENT: Sustained ah maximum phonation time: 9.0 seconds Sustained ah loudness average: 72 dB Average fundamental frequency during sustained ah:181.3 Hz   (2.3 SD below average  of  244 Hz +/- 27 for gender)  Oral reading (passage) loudness average: 71 dB Oral reading loudness range: 22 dB Conversational pitch average: 170 Hz Highest dynamic pitch in conversational speech: 250 Hz Lowest dynamic pitch in conversational speech: 132 Hz Conversational pitch range: 119 Hz Conversational loudness average:72 dB Conversational loudness range: 20 dB S/z ratio: 1.8 (Suggestive of dysfunction >1.0) Voice quality: normal and diplophonia    ORAL MOTOR EXAMINATION Facial : WFL Lingual: WFL Velum: WFL Mandible: WFL Cough: WFL   PATIENT REPORTED OUTCOME MEASURES (PROM):  VOICE HANDICAP INDEX (VHI)  The Voice Handicap Index is comprised of a series of questions to assess the patient's perception of their voice. It is designed to evaluate the emotional, physical and functional components of the voice problem.  Functional: 11 Physical: 21 Emotional: 2 Total: 35 (Normal mean 8.75, SD =14.97)  z score =  1.75 mild = 1.01-1.99  TODAY'S TREATMENT:  Skilled education provided on vocal cords, vocal quality and ST POC. Skilled education provided on use of pectin based throat lozenge to help with report of chronic cough.    PATIENT EDUCATION: Education details: as above Person educated: Patient Education method: Explanation Education comprehension: needs further education   HOME EXERCISE PROGRAM: Use pectin based cough lozenge to help with cough/throat clearing Keep a journal of loss of phonation with times/dates/length of phonation prior to loss of phonation     GOALS: Goals reviewed with patient? Yes  SHORT TERM GOALS: Target date: 10 sessions  The client will perform diaphragmatic breathing (belly breathing) for 5 continuous minutes with appropriate technique in 4 out of 5 sessions to reduce upper-chest breathing patterns. Baseline: Goal status: INITIAL  2.  The patient will eliminate phonotraumatic behaviors such as chronic throat clearing, by  substituting non-traumatic methods to clear mucus.  Baseline:  Goal status: INITIAL   LONG TERM GOALS: Target date: 11/05/2024  Client will report improved participation in a physically demanding activity (e.g., exercise class, sports practice) without PVFM symptoms at least twice per week for 3 consecutive weeks. Baseline:  Goal status: INITIAL  2.  The patient will demonstrate independent understanding of vocal hygiene  concepts.  Baseline:  Goal status: INITIAL  3.  Patient will report improved communication effectiveness as measured by PROM Baseline: VHI 1.75 Goal status: INITIAL   ASSESSMENT:  CLINICAL IMPRESSION: Patient is a 69 y.o. female who was seen today for a voice evaluation d/t mild dysphonia that she describes as periodic/episodic loss of voice. She further describes talking on the phone for ~ 10 minutes and experiencing sudden loss of phonation. While this was not observed during today's evaluation, pt would benefit from skilled ST to target vocal dysfunction.   OBJECTIVE IMPAIRMENTS include voice disorder. These impairments are limiting patient from effectively communicating at home and in community. Factors affecting potential to achieve goals and functional outcome are co-morbidities. Patient will benefit from skilled SLP services to address above impairments and improve overall function.  REHAB POTENTIAL: Good  PLAN: SLP FREQUENCY: 1-2x/week  SLP DURATION: 12 weeks  PLANNED INTERVENTIONS: SLP instruction and feedback, Compensatory strategies, and Patient/family education    Amarius Toto B. Rubbie, M.S., CCC-SLP, CBIS Speech-Language Pathologist Certified Brain Injury Specialist [facility]  [facility] [phone] Ascom [phone] Fax [phone]   "

## 2024-08-18 ENCOUNTER — Ambulatory Visit: Admitting: Podiatry

## 2024-08-19 ENCOUNTER — Ambulatory Visit: Admitting: Speech Pathology

## 2024-08-19 DIAGNOSIS — R490 Dysphonia: Secondary | ICD-10-CM

## 2024-08-21 ENCOUNTER — Ambulatory Visit: Admitting: Speech Pathology

## 2024-08-21 ENCOUNTER — Other Ambulatory Visit: Payer: Self-pay | Admitting: Cardiology

## 2024-08-21 DIAGNOSIS — R490 Dysphonia: Secondary | ICD-10-CM

## 2024-08-21 NOTE — Therapy (Signed)
 " OUTPATIENT SPEECH LANGUAGE PATHOLOGY  VOICE TREATMENT NOTE   Patient Name: Meagan Allen MRN: [id number] DOB:24-Dec-1955, 69 y.o., female Today's Date: 08/21/2024  PCP: Alm Needle, MD  REFERRING PROVIDER: Chinita Hasten, MD   End of Session - 08/21/24 1319     Visit Number 3    Number of Visits 17    Date for Recertification  11/05/24    Authorization Type [employer]    Progress Note Due on Visit 10    SLP Start Time 1315    SLP Stop Time  1340    SLP Time Calculation (min) 25 min    Activity Tolerance Patient tolerated treatment well          Past Medical History:  Diagnosis Date   Anemia    no recent iron transfusion per pt on 05-16-2021   Anxiety    Arthritis    osteo, inflammatory arthritis and rheumatoid arthritis   Cancer (HCC)    thyroid  cancer   CHF (congestive heart failure) (HCC)    diagnosed feb 2019   Deafness in right ear    Depression    Dysphagia 07/19/2020   occ   Dyspnea    on exertion on rare occasions   Enlarged liver    Expressive aphasia 03/15/2020   resolved per pt on 05-16-2021   Fibromyalgia    Gastroesophageal reflux disease with esophagitis without hemorrhage 07/19/2020   GERD (gastroesophageal reflux disease)    Headache disorder 03/15/2020   Heart murmur    High uric acid in 24 hour urine specimen 09/24/2019   History of colon polyps    History of corticosteroid therapy 07/13/2020   History of COVID-19 03/2021   mild symptoms x 5 days all symptoms resolved   History of thyroid  cancer 2007;  2009   dx papillary thyroid  cancer w/ mets to cervical lymph nodes   HOH (hard of hearing)    left ear   HTN (hypertension) 03/25/2012   Hypercalcemia 07/13/2020   Hyperlipidemia    Hypertension    Hypothyroidism 07/13/2020   IBS (irritable bowel syndrome)    constipation issues   Iron deficiency anemia 07/13/2020   Lesion of liver 07/19/2020   Meningioma (HCC) 03/29/2021   8 mm left frontal lobe unchanged  suspected 3 mm left ant temporal lobe memingioma unchanged in size per 03-29-2021 brain mri   Migraine    Mood changes    Morbid obesity (HCC) 07/13/2020   Nonischemic cardiomyopathy [facility]) cardiologist-  dr margean   per cardiac cath 08-28-2017  moderate LV dysfunction w/ diffuse hypocontractility ,  ef 35-40%   Optic nerve and pathway injury, left, initial encounter    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, per opthamologist black spot middle of left eye   PONV (postoperative nausea and vomiting)    last colonoscopy propfol bp dropped and vomiting   Post-surgical hypothyroidism    Primary localized osteoarthrosis of ankle and foot 02/13/2020   and hands   Rectal bleeding 05/02/2021   Rosacea    Thyroid  cancer (HCC) 06/25/2006   surgery and radioactive iodine done   Type 2 diabetes mellitus (HCC)    followed by pcp   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HCC)    Wears glasses    Wears hearing aid in both ears    left ear hearing aid, microphone in right ear   Past Surgical History:  Procedure Laterality Date   CARDIOVASCULAR STRESS TEST  08-20-2017   dr bernie   High  risk nuclear study w/ large irreverisible defect in the basal and mid inferoseptal, inferior, inferolateral walls and apical septal, inferior walls with small peri infarct ischemia in the apical anterior & lateral walls (consistant w/ prior MI )/  no ST segment deviation noted /  nuclear stress ef 36%/  recommended cardiac cath   CARPOMETACARPAL [facility]) FUSION OF THUMB Bilateral 2003   COLONOSCOPY WITH PROPOFOL  N/A 05/06/2013   Procedure: COLONOSCOPY WITH PROPOFOL ;  Surgeon: Gladis MARLA Louder, MD;  Location: [facility];  Service: Endoscopy;  Laterality: N/A;   D & C HYSTEROSCOPY /  RESECTION POLYP/ ROLLER BALL ABLATION  02-26-2004   dr janett  [facility]   DILATION AND CURETTAGE OF UTERUS  1984   EXCISION MORTON'S NEUROMA Left 1996   giant cell tumor  2011   Left ankle 2011   JOINT REPLACEMENT Left 05/2020   2 [facility] [place]   KNEE ARTHROSCOPY  08-16-2010  dr vickye  [facility];  05/ 2012   right x2 left x1   LEFT HEART CATH AND CORONARY ANGIOGRAPHY N/A 08/28/2017   Procedure: LEFT HEART CATH AND CORONARY ANGIOGRAPHY;  Surgeon: Burnard Debby LABOR, MD;  Location: [facility] INVASIVE CV LAB;  Service: Cardiovascular;  Laterality: N/A;   large normal coronary arteries in a dominant RCA system;  moderate LV systoic dysfunction w/ diffuse hypocontractility (compatible w/ nonischemic cardiomyopathy), LV end diastoilc pressure normal, LVEF 35-45% by visual estimate   Left knee replaced   05/2010   NASAL LACRIMAL DUCT SURGERY  06/14/2009   REDO RIGHT MODIFIED RADICAL NECK DISSECTION  08-29-2007    [facility]   SHOULDER ARTHROSCOPY WITH ROTATOR CUFF REPAIR AND SUBACROMIAL DECOMPRESSION Left 10/17/2017   Procedure: Left shoulder mini open rotator cuff repair, subacromial decompression;  Surgeon: Duwayne Purchase, MD;  Location: [facility];  Service: Orthopedics;  Laterality: Left;  Interscalene Block   TOTAL KNEE ARTHROPLASTY Right 06/03/2018   Procedure: RIGHT TOTAL KNEE ARTHROPLASTY;  Surgeon: Rubie Kemps, MD;  Location: [facility];  Service: Orthopedics;  Laterality: Right;   TOTAL THYROIDECTOMY  2007  -- [facility]   w/ dissection lymph nodes   TRANSANAL HEMORRHOIDAL DEARTERIALIZATION N/A 05/20/2021   Procedure: TRANSANAL HEMORRHOIDAL DEARTERIALIZATION;  Surgeon: Debby Hila, MD;  Location: [facility];  Service: General;  Laterality: N/A;   TRANSTHORACIC ECHOCARDIOGRAM  08-20-2017  dr bernie   ef 25-30%, severe diffuse hypokinesis with no identifiable regional variations, but with profound dyssynchrony, due to arrhythmia insuffient to evaluate LV diastolic dysfunction/  trivial MR/  mild LAE/ Ventricular septum motion abnormal funtion,dyssynergy, & paradox   WRIST SURGERY Right 2001   Patient Active Problem List   Diagnosis Date Noted   Impaired tolerance of activity 10/04/2023   Weakness of right shoulder 10/04/2023    DISH (diffuse idiopathic skeletal hyperostosis) 09/04/2023   Chronic thoracic spine pain 09/04/2023   Dizziness 07/25/2023   Constipation 07/25/2023   Cold feet 07/25/2023   Bloating 07/25/2023   Venous insufficiency 07/25/2023   Digital mucinous cyst of finger 04/09/2023   Trigger finger, acquired 04/09/2023   At risk for falling 02/07/2023   Weakness of both lower extremities 02/07/2023   Problem involving surgical incision 01/30/2023   Mass of spine 12/12/2022   Ganglion cyst of finger of left hand 10/24/2022   Nonsustained ventricular tachycardia (HCC) 08/01/2022   Renal insufficiency 06/04/2022   Hyperparathyroidism 06/04/2022   Varicose veins of both lower extremities with inflammation 06/04/2022   Chest pain 05/27/2022   AKI (acute kidney injury) 05/27/2022  Bradycardia 05/26/2022   Atherosclerosis 12/19/2021   Wears hearing aid in both ears 09/29/2021   Wears glasses 09/29/2021   Vitreomacular adhesion of both eyes 09/16/2021   Retinoschisis of left eye 09/16/2021   Rectal bleeding 05/02/2021   Meningioma (HCC) 03/29/2021   Encounter for screening for other metabolic disorders 03/25/2021   History of COVID-19 03/2021   Pain in joint of left shoulder 02/21/2021   Pain in joint of left elbow 02/21/2021   Enlarged liver    Sensory ataxia 09/27/2020   Neuropathy 09/27/2020   Dysphagia 07/19/2020   Gastroesophageal reflux disease with esophagitis without hemorrhage 07/19/2020   Lesion of liver 07/19/2020   Chronic kidney disease, stage 3a (HCC) 07/13/2020   History of corticosteroid therapy 07/13/2020   Hypercalcemia 07/13/2020   Hyperglycemia due to type 2 diabetes mellitus (HCC) 07/13/2020   Hypothyroidism 07/13/2020   Iron deficiency anemia 07/13/2020   Morbid obesity (HCC) 07/13/2020   Polyneuropathy due to type 2 diabetes mellitus (HCC) 07/13/2020   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HCC)    Type 2 diabetes mellitus (HCC)    Rosacea    Post-surgical hypothyroidism     PONV (postoperative nausea and vomiting)    Optic nerve and pathway injury, left, initial encounter    Left rotator cuff tear    IBS (irritable bowel syndrome)    Hypertension    Hyperlipidemia    History of thyroid  cancer    History of colon polyps    Heart murmur    Hearing loss of right ear    GERD (gastroesophageal reflux disease)    Fibromyalgia    Dyspnea    Depression    Cancer (HCC)    Arthritis    Anxiety    Anemia    Expressive aphasia 03/15/2020   Headache disorder 03/15/2020   Mood change 03/15/2020   Primary localized osteoarthrosis of ankle and foot 02/13/2020   Primary osteoarthritis, right ankle and foot 02/13/2020   High uric acid in 24 hour urine specimen 09/24/2019   CHF (congestive heart failure) (HCC)    Migraine    Encounter for other specified surgical aftercare 10/31/2017   Osteoarthritis of right knee 10/31/2017   Nonischemic cardiomyopathy (HCC) 08/22/2017   HTN (hypertension) 03/25/2012   Arthritis of knee 03/25/2012   Thyroid  cancer (HCC) 06/25/2006    ONSET DATE: symptoms began mid-August 2025; date of referral 07/31/2024  REFERRING DIAG: R49.0 Dysphonia  THERAPY DIAG:  Dysphonia  Rationale for Evaluation and Treatment Rehabilitation  SUBJECTIVE:    PERTINENT HISTORY and DIAGNOSTIC FINDINGS:   Pt is a 69 year old female with history of:  THYROID  CANCER  Papillary thyroid  carcinoma - S/p total thyroidectomy, right neck dissection; pathology demonstrated follicular variant papillary thyroid  cancer (3.5 cm) with 4/15 LNs - Underwent RAI therapy (2007) - Patient had repeat neck dissection/mediastinum dissection.  HYPERPARATHYROIDISM (MULTI-GLANDULAR)   GERD  Per pulmonology note dated 07/01/2024 Chronic Cough - She reports a persistent dry cough, improved since mid-August 2025 but still present, worsened by dairy. - She occasionally wakes with a foggy throat and drainage. - She has not used saline sprays or rinses and has  been prescribed a nasal antibiotic but has not started it.  Additional past medical history includes inflammatory arthritis, type 2 diabetes, HFrEF, meningiomas with brain scans every 1-2 years that also showed sinus thickening,    Recent ENT note on 07/30/2024 reports Chief complaint of hoarseness described as a breathy, loss of voice, raspy, and strained and mild in severity -  Pt had URI with cough, hoarse with weak voice for 6 weeks, no major path seen - Dry mouth and hoarseness after speaking; after speaking she loses her voice - Hoarseness has been on/off for 5 months Laryngoscopy revealed mild bowing, no tumor or mass seen of note pt with sepital deviation, convex, airway obstruction 50-75%   PAIN:  Are you having pain? No    LIVING ENVIRONMENT: Lives with: lives with their spouse Lives in: House/apartment  PLOF: Independent  PATIENT GOALS    to improve voice  SUBJECTIVE STATEMENT: Pt pleasant, good historian Pt accompanied by: self  OBJECTIVE:   TODAY'S TREATMENT:  Skilled treatment session targeted pt's dysphonia goals. SLP facilitated session by providing the following skilled interventions:  Pt reports that her MRI is scheduled for this afternoon. She also reports that yesterday I had nothing indicating no issues with her voice. She does report that today she has felt wetness with productive cough improving vocal quality. She brought in recording of her voice and reports that deep sniff/purse lip inhalation helped.   Education provided on performing deep sniff or productive cough immediately during moments of dysphonia vs initially talking during them.   She reports good compliance with reducing potential irritants.   Finally pt completed the Vocal Cord Dysfunction Questionnaire (VCD-Q). Pt had a score of 33 with a score of 12 or below considered normal.   PATIENT EDUCATION: Education details: as above Person educated: Patient Education method:  Explanation Education comprehension: needs further education   HOME EXERCISE PROGRAM: Use pectin based cough lozenge to help with cough/throat clearing Keep a journal of loss of phonation with times/dates/length of phonation prior to loss of phonation     GOALS: Goals reviewed with patient? Yes  SHORT TERM GOALS: Target date: 10 sessions  The client will perform diaphragmatic breathing (belly breathing) for 5 continuous minutes with appropriate technique in 4 out of 5 sessions to reduce upper-chest breathing patterns. Baseline: Goal status: INITIAL  2.  The patient will eliminate phonotraumatic behaviors such as chronic throat clearing, by substituting non-traumatic methods to clear mucus.  Baseline:  Goal status: INITIAL   LONG TERM GOALS: Target date: 11/05/2024  Client will report improved participation in a physically demanding activity (e.g., exercise class, sports practice) without PVFM symptoms at least twice per week for 3 consecutive weeks. Baseline:  Goal status: INITIAL  2.  The patient will demonstrate independent understanding of vocal hygiene concepts.  Baseline:  Goal status: INITIAL  3.  Patient will report improved communication effectiveness as measured by PROM Baseline: VHI 1.75 Goal status: INITIAL   ASSESSMENT:  CLINICAL IMPRESSION: Patient is a 69 y.o. female who was seen today for a voice treatment d/t mild dysphonia that she describes as periodic/episodic loss of voice. She further describes talking on the phone for ~ 10 minutes and experiencing sudden loss of phonation.   Pt continues to be eager and implement all therapuetic recommendations. See the above treatment note for details.   OBJECTIVE IMPAIRMENTS include voice disorder. These impairments are limiting patient from effectively communicating at home and in community. Factors affecting potential to achieve goals and functional outcome are co-morbidities. Patient will benefit from skilled  SLP services to address above impairments and improve overall function.  REHAB POTENTIAL: Good  PLAN: SLP FREQUENCY: 1-2x/week  SLP DURATION: 12 weeks  PLANNED INTERVENTIONS: SLP instruction and feedback, Compensatory strategies, and Patient/family education    Ihsan Nomura B. Rubbie, M.S., CCC-SLP, CBIS Speech-Language Pathologist Certified Brain Injury Specialist [place]  [facility] Office [phone] Ascom [phone] Fax [phone]   "

## 2024-08-25 ENCOUNTER — Ambulatory Visit: Admitting: Speech Pathology

## 2024-08-28 ENCOUNTER — Ambulatory Visit: Admitting: Speech Pathology

## 2024-09-02 ENCOUNTER — Ambulatory Visit: Admitting: Speech Pathology

## 2024-09-05 ENCOUNTER — Ambulatory Visit: Admitting: Speech Pathology

## 2024-10-28 ENCOUNTER — Ambulatory Visit: Admitting: Cardiology
